# Patient Record
Sex: Female | Born: 1935 | Race: Black or African American | Hispanic: No | Marital: Married | Smoking: Never smoker
Health system: Southern US, Community
[De-identification: ages and names within clinical notes are randomized; demographics above are authoritative.]

## PROBLEM LIST (undated history)

## (undated) DIAGNOSIS — R053 Chronic cough: Secondary | ICD-10-CM

## (undated) DIAGNOSIS — K859 Acute pancreatitis without necrosis or infection, unspecified: Secondary | ICD-10-CM

## (undated) DIAGNOSIS — N1832 Chronic kidney disease, stage 3b: Secondary | ICD-10-CM

## (undated) DIAGNOSIS — E669 Obesity, unspecified: Secondary | ICD-10-CM

## (undated) DIAGNOSIS — R05 Cough: Secondary | ICD-10-CM

## (undated) DIAGNOSIS — I3139 Other pericardial effusion (noninflammatory): Secondary | ICD-10-CM

## (undated) DIAGNOSIS — K279 Peptic ulcer, site unspecified, unspecified as acute or chronic, without hemorrhage or perforation: Secondary | ICD-10-CM

## (undated) DIAGNOSIS — F329 Major depressive disorder, single episode, unspecified: Secondary | ICD-10-CM

## (undated) DIAGNOSIS — I491 Atrial premature depolarization: Secondary | ICD-10-CM

## (undated) DIAGNOSIS — I313 Pericardial effusion (noninflammatory): Secondary | ICD-10-CM

## (undated) DIAGNOSIS — M1990 Unspecified osteoarthritis, unspecified site: Secondary | ICD-10-CM

## (undated) DIAGNOSIS — B1920 Unspecified viral hepatitis C without hepatic coma: Secondary | ICD-10-CM

## (undated) DIAGNOSIS — K219 Gastro-esophageal reflux disease without esophagitis: Secondary | ICD-10-CM

## (undated) DIAGNOSIS — I493 Ventricular premature depolarization: Secondary | ICD-10-CM

## (undated) DIAGNOSIS — I119 Hypertensive heart disease without heart failure: Secondary | ICD-10-CM

## (undated) DIAGNOSIS — E785 Hyperlipidemia, unspecified: Secondary | ICD-10-CM

## (undated) DIAGNOSIS — F32A Depression, unspecified: Secondary | ICD-10-CM

## (undated) DIAGNOSIS — I2510 Atherosclerotic heart disease of native coronary artery without angina pectoris: Secondary | ICD-10-CM

## (undated) DIAGNOSIS — G4730 Sleep apnea, unspecified: Secondary | ICD-10-CM

## (undated) DIAGNOSIS — I350 Nonrheumatic aortic (valve) stenosis: Secondary | ICD-10-CM

## (undated) DIAGNOSIS — I5032 Chronic diastolic (congestive) heart failure: Secondary | ICD-10-CM

## (undated) DIAGNOSIS — R0600 Dyspnea, unspecified: Secondary | ICD-10-CM

## (undated) DIAGNOSIS — B1910 Unspecified viral hepatitis B without hepatic coma: Secondary | ICD-10-CM

## (undated) DIAGNOSIS — K589 Irritable bowel syndrome without diarrhea: Secondary | ICD-10-CM

## (undated) HISTORY — DX: Atherosclerotic heart disease of native coronary artery without angina pectoris: I25.10

## (undated) HISTORY — DX: Depression, unspecified: F32.A

## (undated) HISTORY — DX: Unspecified viral hepatitis B without hepatic coma: B19.10

## (undated) HISTORY — DX: Hyperlipidemia, unspecified: E78.5

## (undated) HISTORY — DX: Irritable bowel syndrome, unspecified: K58.9

## (undated) HISTORY — DX: Other pericardial effusion (noninflammatory): I31.39

## (undated) HISTORY — DX: Unspecified osteoarthritis, unspecified site: M19.90

## (undated) HISTORY — PX: CHOLECYSTECTOMY: SHX55

## (undated) HISTORY — DX: Major depressive disorder, single episode, unspecified: F32.9

## (undated) HISTORY — DX: Hypertensive heart disease without heart failure: I11.9

## (undated) HISTORY — DX: Peptic ulcer, site unspecified, unspecified as acute or chronic, without hemorrhage or perforation: K27.9

## (undated) HISTORY — DX: Chronic diastolic (congestive) heart failure: I50.32

## (undated) HISTORY — DX: Nonrheumatic aortic (valve) stenosis: I35.0

## (undated) HISTORY — DX: Acute pancreatitis without necrosis or infection, unspecified: K85.90

## (undated) HISTORY — DX: Unspecified viral hepatitis C without hepatic coma: B19.20

## (undated) HISTORY — DX: Chronic cough: R05.3

## (undated) HISTORY — DX: Cough: R05

## (undated) HISTORY — DX: Gastro-esophageal reflux disease without esophagitis: K21.9

## (undated) HISTORY — DX: Dyspnea, unspecified: R06.00

## (undated) HISTORY — DX: Ventricular premature depolarization: I49.3

## (undated) HISTORY — DX: Atrial premature depolarization: I49.1

## (undated) HISTORY — DX: Sleep apnea, unspecified: G47.30

## (undated) HISTORY — DX: Pericardial effusion (noninflammatory): I31.3

## (undated) HISTORY — DX: Obesity, unspecified: E66.9

---

## 1988-06-06 HISTORY — PX: BREAST EXCISIONAL BIOPSY: SUR124

## 1993-06-06 HISTORY — PX: TUBAL LIGATION: SHX77

## 1996-06-06 HISTORY — PX: OTHER SURGICAL HISTORY: SHX169

## 1997-10-21 ENCOUNTER — Inpatient Hospital Stay (HOSPITAL_COMMUNITY): Admission: EM | Admit: 1997-10-21 | Discharge: 1997-10-22 | Payer: Self-pay | Admitting: Emergency Medicine

## 1997-12-15 ENCOUNTER — Ambulatory Visit: Admission: RE | Admit: 1997-12-15 | Discharge: 1997-12-15 | Payer: Self-pay | Admitting: Infectious Diseases

## 1998-06-23 ENCOUNTER — Ambulatory Visit (HOSPITAL_COMMUNITY): Admission: RE | Admit: 1998-06-23 | Discharge: 1998-06-23 | Payer: Self-pay | Admitting: Internal Medicine

## 1998-06-23 ENCOUNTER — Encounter: Payer: Self-pay | Admitting: Internal Medicine

## 1999-01-21 ENCOUNTER — Other Ambulatory Visit: Admission: RE | Admit: 1999-01-21 | Discharge: 1999-01-21 | Payer: Self-pay | Admitting: Family Medicine

## 1999-02-25 ENCOUNTER — Ambulatory Visit (HOSPITAL_COMMUNITY): Admission: RE | Admit: 1999-02-25 | Discharge: 1999-02-25 | Payer: Self-pay | Admitting: Gastroenterology

## 1999-03-09 ENCOUNTER — Other Ambulatory Visit: Admission: RE | Admit: 1999-03-09 | Discharge: 1999-03-09 | Payer: Self-pay | Admitting: Obstetrics and Gynecology

## 1999-03-09 ENCOUNTER — Encounter (INDEPENDENT_AMBULATORY_CARE_PROVIDER_SITE_OTHER): Payer: Self-pay | Admitting: Specialist

## 1999-03-18 ENCOUNTER — Encounter: Payer: Self-pay | Admitting: Emergency Medicine

## 1999-03-18 ENCOUNTER — Emergency Department (HOSPITAL_COMMUNITY): Admission: EM | Admit: 1999-03-18 | Discharge: 1999-03-18 | Payer: Self-pay | Admitting: Emergency Medicine

## 1999-04-01 ENCOUNTER — Encounter: Admission: RE | Admit: 1999-04-01 | Discharge: 1999-05-18 | Payer: Self-pay | Admitting: Family Medicine

## 1999-04-06 ENCOUNTER — Other Ambulatory Visit: Admission: RE | Admit: 1999-04-06 | Discharge: 1999-04-06 | Payer: Self-pay | Admitting: Obstetrics and Gynecology

## 1999-04-13 ENCOUNTER — Ambulatory Visit (HOSPITAL_COMMUNITY): Admission: RE | Admit: 1999-04-13 | Discharge: 1999-04-13 | Payer: Self-pay | Admitting: Family Medicine

## 1999-04-13 ENCOUNTER — Encounter: Payer: Self-pay | Admitting: Family Medicine

## 1999-12-23 ENCOUNTER — Encounter: Payer: Self-pay | Admitting: Gastroenterology

## 1999-12-23 ENCOUNTER — Ambulatory Visit (HOSPITAL_COMMUNITY): Admission: RE | Admit: 1999-12-23 | Discharge: 1999-12-23 | Payer: Self-pay | Admitting: Gastroenterology

## 2000-01-03 ENCOUNTER — Encounter (INDEPENDENT_AMBULATORY_CARE_PROVIDER_SITE_OTHER): Payer: Self-pay

## 2000-01-03 ENCOUNTER — Ambulatory Visit (HOSPITAL_COMMUNITY): Admission: RE | Admit: 2000-01-03 | Discharge: 2000-01-03 | Payer: Self-pay | Admitting: Gastroenterology

## 2000-04-19 ENCOUNTER — Encounter (INDEPENDENT_AMBULATORY_CARE_PROVIDER_SITE_OTHER): Payer: Self-pay | Admitting: *Deleted

## 2000-04-19 ENCOUNTER — Ambulatory Visit (HOSPITAL_COMMUNITY): Admission: RE | Admit: 2000-04-19 | Discharge: 2000-04-19 | Payer: Self-pay | Admitting: Gastroenterology

## 2000-08-23 ENCOUNTER — Encounter: Payer: Self-pay | Admitting: Internal Medicine

## 2000-08-23 ENCOUNTER — Encounter: Admission: RE | Admit: 2000-08-23 | Discharge: 2000-08-23 | Payer: Self-pay | Admitting: Internal Medicine

## 2001-08-28 ENCOUNTER — Encounter: Payer: Self-pay | Admitting: Internal Medicine

## 2001-08-28 ENCOUNTER — Encounter: Admission: RE | Admit: 2001-08-28 | Discharge: 2001-08-28 | Payer: Self-pay | Admitting: Internal Medicine

## 2001-10-04 ENCOUNTER — Ambulatory Visit (HOSPITAL_COMMUNITY): Admission: RE | Admit: 2001-10-04 | Discharge: 2001-10-04 | Payer: Self-pay | Admitting: Gastroenterology

## 2001-10-04 ENCOUNTER — Encounter (INDEPENDENT_AMBULATORY_CARE_PROVIDER_SITE_OTHER): Payer: Self-pay | Admitting: Specialist

## 2001-10-23 ENCOUNTER — Encounter (INDEPENDENT_AMBULATORY_CARE_PROVIDER_SITE_OTHER): Payer: Self-pay

## 2001-10-23 ENCOUNTER — Ambulatory Visit (HOSPITAL_COMMUNITY): Admission: RE | Admit: 2001-10-23 | Discharge: 2001-10-23 | Payer: Self-pay | Admitting: Gastroenterology

## 2002-01-04 ENCOUNTER — Other Ambulatory Visit: Admission: RE | Admit: 2002-01-04 | Discharge: 2002-01-04 | Payer: Self-pay | Admitting: *Deleted

## 2002-08-30 ENCOUNTER — Encounter: Admission: RE | Admit: 2002-08-30 | Discharge: 2002-08-30 | Payer: Self-pay | Admitting: Internal Medicine

## 2002-08-30 ENCOUNTER — Encounter: Payer: Self-pay | Admitting: Internal Medicine

## 2003-09-01 ENCOUNTER — Encounter: Admission: RE | Admit: 2003-09-01 | Discharge: 2003-09-01 | Payer: Self-pay | Admitting: Internal Medicine

## 2004-01-25 ENCOUNTER — Emergency Department (HOSPITAL_COMMUNITY): Admission: EM | Admit: 2004-01-25 | Discharge: 2004-01-25 | Payer: Self-pay | Admitting: Emergency Medicine

## 2004-02-11 ENCOUNTER — Encounter (INDEPENDENT_AMBULATORY_CARE_PROVIDER_SITE_OTHER): Payer: Self-pay | Admitting: Specialist

## 2004-02-11 ENCOUNTER — Ambulatory Visit (HOSPITAL_COMMUNITY): Admission: RE | Admit: 2004-02-11 | Discharge: 2004-02-11 | Payer: Self-pay | Admitting: Gastroenterology

## 2004-07-20 ENCOUNTER — Encounter: Admission: RE | Admit: 2004-07-20 | Discharge: 2004-07-20 | Payer: Self-pay | Admitting: Gastroenterology

## 2004-09-02 ENCOUNTER — Encounter: Admission: RE | Admit: 2004-09-02 | Discharge: 2004-09-02 | Payer: Self-pay | Admitting: Internal Medicine

## 2004-09-23 ENCOUNTER — Ambulatory Visit: Payer: Self-pay | Admitting: Internal Medicine

## 2004-09-27 ENCOUNTER — Ambulatory Visit: Payer: Self-pay | Admitting: Internal Medicine

## 2004-10-22 ENCOUNTER — Ambulatory Visit: Payer: Self-pay | Admitting: Internal Medicine

## 2004-12-17 ENCOUNTER — Ambulatory Visit: Payer: Self-pay | Admitting: Internal Medicine

## 2004-12-27 ENCOUNTER — Ambulatory Visit: Payer: Self-pay | Admitting: Cardiology

## 2005-01-28 ENCOUNTER — Ambulatory Visit: Payer: Self-pay | Admitting: Cardiology

## 2005-02-11 ENCOUNTER — Ambulatory Visit: Payer: Self-pay | Admitting: Internal Medicine

## 2005-05-11 ENCOUNTER — Ambulatory Visit: Payer: Self-pay | Admitting: Internal Medicine

## 2005-05-28 ENCOUNTER — Encounter: Admission: RE | Admit: 2005-05-28 | Discharge: 2005-05-28 | Payer: Self-pay | Admitting: Internal Medicine

## 2005-05-31 ENCOUNTER — Ambulatory Visit: Payer: Self-pay | Admitting: Endocrinology

## 2005-08-11 ENCOUNTER — Emergency Department (HOSPITAL_COMMUNITY): Admission: EM | Admit: 2005-08-11 | Discharge: 2005-08-12 | Payer: Self-pay | Admitting: Emergency Medicine

## 2005-08-12 ENCOUNTER — Ambulatory Visit: Payer: Self-pay | Admitting: Internal Medicine

## 2005-08-26 ENCOUNTER — Ambulatory Visit: Payer: Self-pay | Admitting: Internal Medicine

## 2005-09-05 ENCOUNTER — Ambulatory Visit: Payer: Self-pay | Admitting: Internal Medicine

## 2005-09-06 ENCOUNTER — Encounter: Admission: RE | Admit: 2005-09-06 | Discharge: 2005-09-06 | Payer: Self-pay | Admitting: Internal Medicine

## 2005-09-13 ENCOUNTER — Ambulatory Visit: Payer: Self-pay | Admitting: Internal Medicine

## 2005-11-19 ENCOUNTER — Ambulatory Visit: Payer: Self-pay | Admitting: Family Medicine

## 2006-02-03 ENCOUNTER — Ambulatory Visit: Payer: Self-pay | Admitting: Internal Medicine

## 2006-05-01 ENCOUNTER — Ambulatory Visit: Payer: Self-pay | Admitting: Internal Medicine

## 2006-07-21 ENCOUNTER — Ambulatory Visit: Payer: Self-pay | Admitting: Internal Medicine

## 2006-08-23 ENCOUNTER — Ambulatory Visit: Payer: Self-pay | Admitting: Internal Medicine

## 2006-08-23 LAB — CONVERTED CEMR LAB
ALT: 20 units/L (ref 0–40)
AST: 22 units/L (ref 0–37)
Albumin: 3.6 g/dL (ref 3.5–5.2)
Alkaline Phosphatase: 91 units/L (ref 39–117)
BUN: 16 mg/dL (ref 6–23)
Basophils Absolute: 0 10*3/uL (ref 0.0–0.1)
Basophils Relative: 0.5 % (ref 0.0–1.0)
Bilirubin Urine: NEGATIVE
Bilirubin, Direct: 0.1 mg/dL (ref 0.0–0.3)
CO2: 32 meq/L (ref 19–32)
Calcium: 9.1 mg/dL (ref 8.4–10.5)
Chloride: 108 meq/L (ref 96–112)
Cholesterol: 180 mg/dL (ref 0–200)
Creatinine, Ser: 0.8 mg/dL (ref 0.4–1.2)
Eosinophils Absolute: 0.3 10*3/uL (ref 0.0–0.6)
Eosinophils Relative: 3.2 % (ref 0.0–5.0)
GFR calc Af Amer: 92 mL/min
GFR calc non Af Amer: 76 mL/min
Glucose, Bld: 110 mg/dL — ABNORMAL HIGH (ref 70–99)
HCT: 40.9 % (ref 36.0–46.0)
HDL: 44.8 mg/dL (ref >39.0)
Hemoglobin, Urine: NEGATIVE
Hemoglobin: 13.6 g/dL (ref 12.0–15.0)
Ketones, ur: NEGATIVE mg/dL
LDL Cholesterol: 114 mg/dL — ABNORMAL HIGH (ref 0–99)
Leukocytes, UA: NEGATIVE
Lymphocytes Relative: 28.7 % (ref 12.0–46.0)
MCHC: 34.2 g/dL (ref 30.0–36.0)
MCV: 88.4 fL (ref 78.0–100.0)
Monocytes Absolute: 0.6 10*3/uL (ref 0.2–0.7)
Monocytes Relative: 7.5 % (ref 3.0–11.0)
Neutro Abs: 4.9 10*3/uL (ref 1.4–7.7)
Neutrophils Relative %: 60.1 % (ref 43.0–77.0)
Nitrite: NEGATIVE
Platelets: 214 10*3/uL (ref 150–400)
Potassium: 4 meq/L (ref 3.5–5.1)
RBC: 4.41 M/uL (ref 3.87–5.11)
RDW: 12.4 % (ref 11.5–14.6)
Sodium: 145 meq/L (ref 135–145)
Specific Gravity, Urine: 1.025 (ref 1.000–1.03)
TSH: 1.31 microintl units/mL (ref 0.35–5.50)
Total Bilirubin: 0.6 mg/dL (ref 0.3–1.2)
Total CHOL/HDL Ratio: 4
Total Protein, Urine: NEGATIVE mg/dL
Total Protein: 7.1 g/dL (ref 6.0–8.3)
Triglycerides: 108 mg/dL (ref 0–149)
Urine Glucose: NEGATIVE mg/dL
Urobilinogen, UA: 0.2 (ref 0.0–1.0)
VLDL: 22 mg/dL (ref 0–40)
WBC: 8.1 10*3/uL (ref 4.5–10.5)
pH: 6 (ref 5.0–8.0)

## 2006-09-08 ENCOUNTER — Encounter: Admission: RE | Admit: 2006-09-08 | Discharge: 2006-09-08 | Payer: Self-pay | Admitting: Internal Medicine

## 2006-09-21 ENCOUNTER — Ambulatory Visit: Payer: Self-pay | Admitting: Internal Medicine

## 2006-09-28 ENCOUNTER — Ambulatory Visit: Payer: Self-pay | Admitting: Cardiology

## 2006-10-05 ENCOUNTER — Ambulatory Visit: Payer: Self-pay

## 2006-10-05 ENCOUNTER — Encounter: Payer: Self-pay | Admitting: Internal Medicine

## 2006-10-13 ENCOUNTER — Ambulatory Visit: Payer: Self-pay | Admitting: Cardiology

## 2006-11-18 ENCOUNTER — Emergency Department (HOSPITAL_COMMUNITY): Admission: EM | Admit: 2006-11-18 | Discharge: 2006-11-18 | Payer: Self-pay | Admitting: *Deleted

## 2006-11-21 ENCOUNTER — Ambulatory Visit: Payer: Self-pay | Admitting: Internal Medicine

## 2006-12-19 ENCOUNTER — Ambulatory Visit: Payer: Self-pay | Admitting: Internal Medicine

## 2006-12-19 ENCOUNTER — Ambulatory Visit: Payer: Self-pay | Admitting: Cardiovascular Disease

## 2006-12-22 ENCOUNTER — Encounter: Payer: Self-pay | Admitting: Internal Medicine

## 2006-12-22 DIAGNOSIS — K219 Gastro-esophageal reflux disease without esophagitis: Secondary | ICD-10-CM | POA: Insufficient documentation

## 2006-12-22 DIAGNOSIS — Z8619 Personal history of other infectious and parasitic diseases: Secondary | ICD-10-CM | POA: Insufficient documentation

## 2006-12-22 DIAGNOSIS — F411 Generalized anxiety disorder: Secondary | ICD-10-CM | POA: Insufficient documentation

## 2007-02-06 ENCOUNTER — Ambulatory Visit: Payer: Self-pay | Admitting: Internal Medicine

## 2007-02-06 ENCOUNTER — Encounter: Payer: Self-pay | Admitting: Internal Medicine

## 2007-02-06 DIAGNOSIS — B1710 Acute hepatitis C without hepatic coma: Secondary | ICD-10-CM | POA: Insufficient documentation

## 2007-02-06 DIAGNOSIS — Z8719 Personal history of other diseases of the digestive system: Secondary | ICD-10-CM | POA: Insufficient documentation

## 2007-02-06 DIAGNOSIS — K589 Irritable bowel syndrome without diarrhea: Secondary | ICD-10-CM | POA: Insufficient documentation

## 2007-02-06 DIAGNOSIS — I10 Essential (primary) hypertension: Secondary | ICD-10-CM | POA: Insufficient documentation

## 2007-02-06 DIAGNOSIS — F329 Major depressive disorder, single episode, unspecified: Secondary | ICD-10-CM | POA: Insufficient documentation

## 2007-02-06 DIAGNOSIS — F3289 Other specified depressive episodes: Secondary | ICD-10-CM | POA: Insufficient documentation

## 2007-02-06 DIAGNOSIS — M810 Age-related osteoporosis without current pathological fracture: Secondary | ICD-10-CM | POA: Insufficient documentation

## 2007-02-06 DIAGNOSIS — E785 Hyperlipidemia, unspecified: Secondary | ICD-10-CM | POA: Insufficient documentation

## 2007-03-16 ENCOUNTER — Emergency Department (HOSPITAL_COMMUNITY): Admission: EM | Admit: 2007-03-16 | Discharge: 2007-03-16 | Payer: Self-pay | Admitting: Family Medicine

## 2007-03-19 ENCOUNTER — Emergency Department (HOSPITAL_COMMUNITY): Admission: EM | Admit: 2007-03-19 | Discharge: 2007-03-19 | Payer: Self-pay | Admitting: Emergency Medicine

## 2007-03-23 ENCOUNTER — Ambulatory Visit (HOSPITAL_COMMUNITY): Admission: RE | Admit: 2007-03-23 | Discharge: 2007-03-23 | Payer: Self-pay | Admitting: Sports Medicine

## 2007-04-02 ENCOUNTER — Encounter: Admission: RE | Admit: 2007-04-02 | Discharge: 2007-04-02 | Payer: Self-pay | Admitting: Sports Medicine

## 2007-04-30 ENCOUNTER — Ambulatory Visit: Payer: Self-pay | Admitting: Cardiology

## 2007-05-07 ENCOUNTER — Ambulatory Visit: Payer: Self-pay

## 2007-05-14 ENCOUNTER — Ambulatory Visit: Payer: Self-pay | Admitting: Cardiology

## 2007-05-26 ENCOUNTER — Ambulatory Visit: Payer: Self-pay | Admitting: Internal Medicine

## 2007-05-26 DIAGNOSIS — M25569 Pain in unspecified knee: Secondary | ICD-10-CM | POA: Insufficient documentation

## 2007-05-29 ENCOUNTER — Encounter: Payer: Self-pay | Admitting: Internal Medicine

## 2007-06-07 HISTORY — PX: CARDIAC CATHETERIZATION: SHX172

## 2007-06-14 ENCOUNTER — Encounter: Payer: Self-pay | Admitting: Internal Medicine

## 2007-06-15 ENCOUNTER — Encounter: Payer: Self-pay | Admitting: Internal Medicine

## 2007-08-10 ENCOUNTER — Ambulatory Visit: Payer: Self-pay | Admitting: Gastroenterology

## 2007-08-14 ENCOUNTER — Ambulatory Visit (HOSPITAL_COMMUNITY): Admission: RE | Admit: 2007-08-14 | Discharge: 2007-08-14 | Payer: Self-pay | Admitting: Gastroenterology

## 2007-09-10 ENCOUNTER — Encounter: Admission: RE | Admit: 2007-09-10 | Discharge: 2007-09-10 | Payer: Self-pay | Admitting: Internal Medicine

## 2007-10-04 ENCOUNTER — Ambulatory Visit: Payer: Self-pay | Admitting: Gastroenterology

## 2008-01-09 ENCOUNTER — Ambulatory Visit (HOSPITAL_COMMUNITY): Admission: RE | Admit: 2008-01-09 | Discharge: 2008-01-09 | Payer: Self-pay | Admitting: Internal Medicine

## 2008-01-18 ENCOUNTER — Ambulatory Visit: Payer: Self-pay | Admitting: Cardiovascular Disease

## 2008-02-12 ENCOUNTER — Ambulatory Visit: Payer: Self-pay | Admitting: Cardiology

## 2008-02-14 ENCOUNTER — Ambulatory Visit: Payer: Self-pay | Admitting: Cardiology

## 2008-02-14 LAB — CONVERTED CEMR LAB
BUN: 21 mg/dL (ref 6–23)
Basophils Absolute: 0 10*3/uL (ref 0.0–0.1)
Basophils Relative: 0.7 % (ref 0.0–3.0)
CO2: 30 meq/L (ref 19–32)
Calcium: 9.6 mg/dL (ref 8.4–10.5)
Chloride: 107 meq/L (ref 96–112)
Cholesterol: 157 mg/dL (ref 0–200)
Creatinine, Ser: 1.1 mg/dL (ref 0.4–1.2)
Eosinophils Absolute: 0.3 10*3/uL (ref 0.0–0.7)
Eosinophils Relative: 4.3 % (ref 0.0–5.0)
GFR calc Af Amer: 63 mL/min
GFR calc non Af Amer: 52 mL/min
Glucose, Bld: 85 mg/dL (ref 70–99)
HCT: 38.9 % (ref 36.0–46.0)
HDL: 36.8 mg/dL — ABNORMAL LOW (ref >39.0)
Hemoglobin: 13.2 g/dL (ref 12.0–15.0)
INR: 1 (ref 0.8–1.0)
LDL Cholesterol: 109 mg/dL — ABNORMAL HIGH (ref 0–99)
Lymphocytes Relative: 33.3 % (ref 12.0–46.0)
MCHC: 34 g/dL (ref 30.0–36.0)
MCV: 91.2 fL (ref 78.0–100.0)
Monocytes Absolute: 0.6 10*3/uL (ref 0.1–1.0)
Monocytes Relative: 10 % (ref 3.0–12.0)
Neutro Abs: 3.2 10*3/uL (ref 1.4–7.7)
Neutrophils Relative %: 51.7 % (ref 43.0–77.0)
Platelets: 172 10*3/uL (ref 150–400)
Potassium: 4.1 meq/L (ref 3.5–5.1)
Prothrombin Time: 12.4 s (ref 10.9–13.3)
RBC: 4.27 M/uL (ref 3.87–5.11)
RDW: 12 % (ref 11.5–14.6)
Sodium: 143 meq/L (ref 135–145)
Total CHOL/HDL Ratio: 4.3
Triglycerides: 57 mg/dL (ref 0–149)
VLDL: 11 mg/dL (ref 0–40)
WBC: 6.1 10*3/uL (ref 4.5–10.5)

## 2008-02-20 ENCOUNTER — Ambulatory Visit: Payer: Self-pay | Admitting: Cardiology

## 2008-02-20 ENCOUNTER — Inpatient Hospital Stay (HOSPITAL_BASED_OUTPATIENT_CLINIC_OR_DEPARTMENT_OTHER): Admission: RE | Admit: 2008-02-20 | Discharge: 2008-02-20 | Payer: Self-pay | Admitting: Cardiology

## 2008-02-29 ENCOUNTER — Ambulatory Visit: Payer: Self-pay | Admitting: Cardiology

## 2008-03-11 ENCOUNTER — Encounter: Payer: Self-pay | Admitting: Internal Medicine

## 2008-03-13 ENCOUNTER — Ambulatory Visit: Payer: Self-pay | Admitting: Cardiology

## 2008-05-15 ENCOUNTER — Ambulatory Visit: Payer: Self-pay | Admitting: Cardiology

## 2008-05-15 ENCOUNTER — Ambulatory Visit: Payer: Self-pay

## 2008-05-15 LAB — CONVERTED CEMR LAB
ALT: 32 units/L (ref 0–35)
AST: 28 units/L (ref 0–37)
Albumin: 3.5 g/dL (ref 3.5–5.2)
Alkaline Phosphatase: 72 units/L (ref 39–117)
Bilirubin, Direct: 0.1 mg/dL (ref 0.0–0.3)
Cholesterol: 124 mg/dL (ref 0–200)
HDL: 40.5 mg/dL (ref >39.0)
LDL Cholesterol: 73 mg/dL (ref 0–99)
Total Bilirubin: 0.7 mg/dL (ref 0.3–1.2)
Total CHOL/HDL Ratio: 3.1
Total Protein: 6.9 g/dL (ref 6.0–8.3)
Triglycerides: 53 mg/dL (ref 0–149)
VLDL: 11 mg/dL (ref 0–40)

## 2008-05-19 DIAGNOSIS — I2510 Atherosclerotic heart disease of native coronary artery without angina pectoris: Secondary | ICD-10-CM | POA: Insufficient documentation

## 2008-07-17 ENCOUNTER — Encounter: Payer: Self-pay | Admitting: Internal Medicine

## 2008-08-21 ENCOUNTER — Encounter: Payer: Self-pay | Admitting: Cardiology

## 2008-08-21 ENCOUNTER — Ambulatory Visit: Payer: Self-pay | Admitting: Cardiology

## 2008-08-21 DIAGNOSIS — R011 Cardiac murmur, unspecified: Secondary | ICD-10-CM | POA: Insufficient documentation

## 2008-09-01 ENCOUNTER — Ambulatory Visit: Payer: Self-pay

## 2008-09-01 ENCOUNTER — Ambulatory Visit: Payer: Self-pay | Admitting: Cardiology

## 2008-09-01 ENCOUNTER — Encounter: Payer: Self-pay | Admitting: Cardiology

## 2008-09-01 LAB — CONVERTED CEMR LAB
ALT: 30 units/L (ref 0–35)
AST: 30 units/L (ref 0–37)
Albumin: 3.6 g/dL (ref 3.5–5.2)
Alkaline Phosphatase: 71 units/L (ref 39–117)
BUN: 17 mg/dL (ref 6–23)
Bilirubin, Direct: 0.1 mg/dL (ref 0.0–0.3)
CO2: 28 meq/L (ref 19–32)
Calcium: 9 mg/dL (ref 8.4–10.5)
Chloride: 113 meq/L — ABNORMAL HIGH (ref 96–112)
Cholesterol: 118 mg/dL (ref 0–200)
Creatinine, Ser: 0.8 mg/dL (ref 0.4–1.2)
GFR calc non Af Amer: 90.96 mL/min (ref >60)
Glucose, Bld: 96 mg/dL (ref 70–99)
HDL: 39.8 mg/dL (ref >39.00)
LDL Cholesterol: 64 mg/dL (ref 0–99)
Potassium: 4.2 meq/L (ref 3.5–5.1)
Sodium: 145 meq/L (ref 135–145)
Total Bilirubin: 0.5 mg/dL (ref 0.3–1.2)
Total CHOL/HDL Ratio: 3
Total Protein: 6.7 g/dL (ref 6.0–8.3)
Triglycerides: 69 mg/dL (ref 0.0–149.0)
VLDL: 13.8 mg/dL (ref 0.0–40.0)

## 2008-09-10 ENCOUNTER — Encounter: Admission: RE | Admit: 2008-09-10 | Discharge: 2008-09-10 | Payer: Self-pay | Admitting: Internal Medicine

## 2008-09-24 ENCOUNTER — Telehealth: Payer: Self-pay | Admitting: Cardiology

## 2008-10-21 ENCOUNTER — Encounter: Payer: Self-pay | Admitting: Internal Medicine

## 2009-02-13 ENCOUNTER — Ambulatory Visit: Payer: Self-pay | Admitting: Cardiology

## 2009-02-13 DIAGNOSIS — R058 Other specified cough: Secondary | ICD-10-CM | POA: Insufficient documentation

## 2009-02-13 DIAGNOSIS — R05 Cough: Secondary | ICD-10-CM | POA: Insufficient documentation

## 2009-02-18 ENCOUNTER — Encounter: Payer: Self-pay | Admitting: Cardiology

## 2009-02-19 ENCOUNTER — Encounter: Payer: Self-pay | Admitting: Internal Medicine

## 2009-02-19 ENCOUNTER — Encounter: Payer: Self-pay | Admitting: Cardiology

## 2009-02-23 ENCOUNTER — Telehealth: Payer: Self-pay | Admitting: Cardiology

## 2009-02-25 ENCOUNTER — Ambulatory Visit: Payer: Self-pay | Admitting: Internal Medicine

## 2009-02-27 ENCOUNTER — Encounter: Payer: Self-pay | Admitting: Cardiology

## 2009-03-30 ENCOUNTER — Ambulatory Visit: Payer: Self-pay | Admitting: Cardiology

## 2009-04-13 ENCOUNTER — Telehealth: Payer: Self-pay | Admitting: Cardiology

## 2009-04-13 ENCOUNTER — Ambulatory Visit: Payer: Self-pay | Admitting: Cardiology

## 2009-04-14 LAB — CONVERTED CEMR LAB
ALT: 23 units/L (ref 0–35)
AST: 17 units/L (ref 0–37)
Albumin: 3.5 g/dL (ref 3.5–5.2)
Alkaline Phosphatase: 61 units/L (ref 39–117)
BUN: 20 mg/dL (ref 6–23)
Bilirubin, Direct: 0.1 mg/dL (ref 0.0–0.3)
CO2: 27 meq/L (ref 19–32)
Calcium: 8.9 mg/dL (ref 8.4–10.5)
Chloride: 109 meq/L (ref 96–112)
Cholesterol: 178 mg/dL (ref 0–200)
Creatinine, Ser: 0.9 mg/dL (ref 0.4–1.2)
GFR calc non Af Amer: 79.26 mL/min (ref >60)
Glucose, Bld: 86 mg/dL (ref 70–99)
HDL: 64.5 mg/dL (ref >39.00)
LDL Cholesterol: 93 mg/dL (ref 0–99)
Potassium: 3.4 meq/L — ABNORMAL LOW (ref 3.5–5.1)
Pro B Natriuretic peptide (BNP): 75 pg/mL (ref 0.0–100.0)
Sodium: 145 meq/L (ref 135–145)
Total Bilirubin: 0.7 mg/dL (ref 0.3–1.2)
Total CHOL/HDL Ratio: 3
Total Protein: 6.8 g/dL (ref 6.0–8.3)
Triglycerides: 101 mg/dL (ref 0.0–149.0)
VLDL: 20.2 mg/dL (ref 0.0–40.0)

## 2009-04-17 ENCOUNTER — Telehealth: Payer: Self-pay | Admitting: Cardiology

## 2009-04-25 ENCOUNTER — Encounter: Admission: RE | Admit: 2009-04-25 | Discharge: 2009-04-25 | Payer: Self-pay | Admitting: Internal Medicine

## 2009-04-29 ENCOUNTER — Telehealth: Payer: Self-pay | Admitting: Cardiology

## 2009-05-01 ENCOUNTER — Ambulatory Visit: Payer: Self-pay | Admitting: Cardiology

## 2009-05-04 ENCOUNTER — Encounter: Payer: Self-pay | Admitting: Internal Medicine

## 2009-05-04 LAB — CONVERTED CEMR LAB
BUN: 22 mg/dL (ref 6–23)
CO2: 26 meq/L (ref 19–32)
Calcium: 8.7 mg/dL (ref 8.4–10.5)
Chloride: 110 meq/L (ref 96–112)
Creatinine, Ser: 1 mg/dL (ref 0.4–1.2)
GFR calc non Af Amer: 70.18 mL/min (ref >60)
Glucose, Bld: 91 mg/dL (ref 70–99)
Potassium: 3.7 meq/L (ref 3.5–5.1)
Sodium: 143 meq/L (ref 135–145)

## 2009-06-17 ENCOUNTER — Telehealth: Payer: Self-pay | Admitting: Cardiology

## 2009-06-30 ENCOUNTER — Ambulatory Visit: Payer: Self-pay | Admitting: Cardiology

## 2009-07-06 LAB — CONVERTED CEMR LAB
ALT: 29 units/L (ref 0–35)
AST: 31 units/L (ref 0–37)
Albumin: 3.5 g/dL (ref 3.5–5.2)
Alkaline Phosphatase: 55 units/L (ref 39–117)
Bilirubin, Direct: 0.1 mg/dL (ref 0.0–0.3)
Cholesterol: 123 mg/dL (ref 0–200)
HDL: 42.5 mg/dL (ref >39.00)
LDL Cholesterol: 66 mg/dL (ref 0–99)
Total Bilirubin: 0.5 mg/dL (ref 0.3–1.2)
Total CHOL/HDL Ratio: 3
Total Protein: 7.2 g/dL (ref 6.0–8.3)
Triglycerides: 75 mg/dL (ref 0.0–149.0)
VLDL: 15 mg/dL (ref 0.0–40.0)

## 2009-07-14 ENCOUNTER — Ambulatory Visit: Payer: Self-pay | Admitting: Cardiology

## 2009-08-10 ENCOUNTER — Telehealth: Payer: Self-pay | Admitting: Cardiology

## 2009-08-10 ENCOUNTER — Ambulatory Visit: Payer: Self-pay | Admitting: Cardiology

## 2009-08-10 DIAGNOSIS — R0602 Shortness of breath: Secondary | ICD-10-CM | POA: Insufficient documentation

## 2009-08-13 ENCOUNTER — Telehealth: Payer: Self-pay | Admitting: Cardiology

## 2009-08-14 ENCOUNTER — Encounter: Payer: Self-pay | Admitting: Cardiology

## 2009-08-15 ENCOUNTER — Encounter: Payer: Self-pay | Admitting: Cardiology

## 2009-08-21 ENCOUNTER — Ambulatory Visit: Payer: Self-pay | Admitting: Cardiology

## 2009-08-21 ENCOUNTER — Telehealth (INDEPENDENT_AMBULATORY_CARE_PROVIDER_SITE_OTHER): Payer: Self-pay | Admitting: *Deleted

## 2009-08-25 LAB — CONVERTED CEMR LAB
BUN: 19 mg/dL (ref 6–23)
CO2: 27 meq/L (ref 19–32)
Calcium: 9.1 mg/dL (ref 8.4–10.5)
Chloride: 112 meq/L (ref 96–112)
Creatinine, Ser: 1 mg/dL (ref 0.4–1.2)
GFR calc non Af Amer: 70.12 mL/min (ref >60)
Glucose, Bld: 106 mg/dL — ABNORMAL HIGH (ref 70–99)
Potassium: 4.2 meq/L (ref 3.5–5.1)
Pro B Natriuretic peptide (BNP): 55 pg/mL (ref 0.0–100.0)
Sodium: 143 meq/L (ref 135–145)
TSH: 1.04 microintl units/mL (ref 0.35–5.50)

## 2009-09-09 ENCOUNTER — Ambulatory Visit: Payer: Self-pay | Admitting: Cardiology

## 2009-09-11 ENCOUNTER — Encounter: Admission: RE | Admit: 2009-09-11 | Discharge: 2009-09-11 | Payer: Self-pay | Admitting: Internal Medicine

## 2009-09-21 ENCOUNTER — Encounter: Payer: Self-pay | Admitting: Cardiology

## 2009-10-27 ENCOUNTER — Telehealth: Payer: Self-pay | Admitting: Cardiology

## 2009-10-28 ENCOUNTER — Encounter: Payer: Self-pay | Admitting: Internal Medicine

## 2009-10-29 ENCOUNTER — Ambulatory Visit: Payer: Self-pay | Admitting: Pulmonary Disease

## 2009-11-03 ENCOUNTER — Ambulatory Visit: Payer: Self-pay

## 2009-11-03 ENCOUNTER — Encounter: Payer: Self-pay | Admitting: Internal Medicine

## 2009-11-03 DIAGNOSIS — R079 Chest pain, unspecified: Secondary | ICD-10-CM | POA: Insufficient documentation

## 2009-11-04 ENCOUNTER — Ambulatory Visit: Payer: Self-pay | Admitting: Cardiology

## 2009-11-06 ENCOUNTER — Telehealth: Payer: Self-pay | Admitting: Pulmonary Disease

## 2009-11-23 ENCOUNTER — Ambulatory Visit: Payer: Self-pay | Admitting: Pulmonary Disease

## 2009-11-25 ENCOUNTER — Ambulatory Visit: Payer: Self-pay | Admitting: Cardiology

## 2009-11-25 LAB — CONVERTED CEMR LAB
ALT: 28 units/L (ref 0–35)
AST: 22 units/L (ref 0–37)
Albumin: 3.8 g/dL (ref 3.5–5.2)
Alkaline Phosphatase: 65 units/L (ref 39–117)
Bilirubin, Direct: 0.2 mg/dL (ref 0.0–0.3)
Cholesterol: 177 mg/dL (ref 0–200)
HDL: 53.3 mg/dL (ref >39.00)
LDL Cholesterol: 101 mg/dL — ABNORMAL HIGH (ref 0–99)
Total Bilirubin: 0.7 mg/dL (ref 0.3–1.2)
Total CHOL/HDL Ratio: 3
Total Protein: 6.8 g/dL (ref 6.0–8.3)
Triglycerides: 116 mg/dL (ref 0.0–149.0)
VLDL: 23.2 mg/dL (ref 0.0–40.0)

## 2009-12-10 ENCOUNTER — Ambulatory Visit: Payer: Self-pay | Admitting: Pulmonary Disease

## 2009-12-10 DIAGNOSIS — J45991 Cough variant asthma: Secondary | ICD-10-CM | POA: Insufficient documentation

## 2009-12-10 LAB — CONVERTED CEMR LAB: IgE (Immunoglobulin E), Serum: 12.2 intl units/mL (ref 0.0–180.0)

## 2009-12-14 ENCOUNTER — Telehealth (INDEPENDENT_AMBULATORY_CARE_PROVIDER_SITE_OTHER): Payer: Self-pay | Admitting: *Deleted

## 2009-12-17 ENCOUNTER — Telehealth: Payer: Self-pay | Admitting: Cardiology

## 2010-01-04 ENCOUNTER — Ambulatory Visit: Payer: Self-pay | Admitting: Pulmonary Disease

## 2010-01-06 ENCOUNTER — Encounter: Payer: Self-pay | Admitting: Cardiology

## 2010-01-09 ENCOUNTER — Emergency Department (HOSPITAL_COMMUNITY): Admission: EM | Admit: 2010-01-09 | Discharge: 2010-01-09 | Payer: Self-pay | Admitting: Emergency Medicine

## 2010-01-18 ENCOUNTER — Ambulatory Visit: Payer: Self-pay | Admitting: Pulmonary Disease

## 2010-01-18 LAB — CONVERTED CEMR LAB: Sed Rate: 22 mm/hr (ref 0–22)

## 2010-01-19 ENCOUNTER — Ambulatory Visit: Payer: Self-pay | Admitting: Pulmonary Disease

## 2010-01-19 ENCOUNTER — Ambulatory Visit: Payer: Self-pay | Admitting: Cardiology

## 2010-01-29 ENCOUNTER — Ambulatory Visit: Payer: Self-pay

## 2010-01-29 ENCOUNTER — Telehealth: Payer: Self-pay | Admitting: Cardiology

## 2010-01-29 ENCOUNTER — Ambulatory Visit: Payer: Self-pay | Admitting: Cardiology

## 2010-02-02 ENCOUNTER — Ambulatory Visit: Payer: Self-pay | Admitting: Pulmonary Disease

## 2010-02-04 ENCOUNTER — Encounter: Payer: Self-pay | Admitting: Pulmonary Disease

## 2010-02-09 LAB — CONVERTED CEMR LAB
Anti Nuclear Antibody(ANA): NEGATIVE
Rhuematoid fact SerPl-aCnc: 53 intl units/mL — ABNORMAL HIGH (ref 0–20)

## 2010-02-11 ENCOUNTER — Encounter: Admission: RE | Admit: 2010-02-11 | Discharge: 2010-03-09 | Payer: Self-pay | Admitting: Orthopedic Surgery

## 2010-02-12 ENCOUNTER — Encounter: Payer: Self-pay | Admitting: Pulmonary Disease

## 2010-02-15 ENCOUNTER — Encounter: Payer: Self-pay | Admitting: Pulmonary Disease

## 2010-02-17 ENCOUNTER — Encounter: Payer: Self-pay | Admitting: Cardiology

## 2010-02-18 ENCOUNTER — Telehealth: Payer: Self-pay | Admitting: Pulmonary Disease

## 2010-02-18 ENCOUNTER — Telehealth: Payer: Self-pay | Admitting: Cardiology

## 2010-02-19 ENCOUNTER — Ambulatory Visit: Payer: Self-pay | Admitting: Cardiology

## 2010-02-23 ENCOUNTER — Telehealth: Payer: Self-pay | Admitting: Cardiology

## 2010-02-23 LAB — CONVERTED CEMR LAB
BUN: 26 mg/dL — ABNORMAL HIGH (ref 6–23)
CO2: 27 meq/L (ref 19–32)
Calcium: 8.8 mg/dL (ref 8.4–10.5)
Chloride: 110 meq/L (ref 96–112)
Creatinine, Ser: 0.9 mg/dL (ref 0.4–1.2)
GFR calc non Af Amer: 75.2 mL/min (ref >60)
Glucose, Bld: 90 mg/dL (ref 70–99)
Potassium: 4.6 meq/L (ref 3.5–5.1)
Sodium: 143 meq/L (ref 135–145)

## 2010-03-01 ENCOUNTER — Ambulatory Visit: Payer: Self-pay | Admitting: Pulmonary Disease

## 2010-03-05 ENCOUNTER — Telehealth (INDEPENDENT_AMBULATORY_CARE_PROVIDER_SITE_OTHER): Payer: Self-pay | Admitting: *Deleted

## 2010-03-09 ENCOUNTER — Encounter: Payer: Self-pay | Admitting: Cardiology

## 2010-03-15 ENCOUNTER — Encounter: Payer: Self-pay | Admitting: Pulmonary Disease

## 2010-03-16 ENCOUNTER — Telehealth (INDEPENDENT_AMBULATORY_CARE_PROVIDER_SITE_OTHER): Payer: Self-pay | Admitting: *Deleted

## 2010-03-24 ENCOUNTER — Encounter: Payer: Self-pay | Admitting: Cardiology

## 2010-04-08 ENCOUNTER — Telehealth: Payer: Self-pay | Admitting: Pulmonary Disease

## 2010-05-04 ENCOUNTER — Telehealth: Payer: Self-pay | Admitting: Adult Health

## 2010-05-14 ENCOUNTER — Ambulatory Visit: Payer: Self-pay | Admitting: Pulmonary Disease

## 2010-05-19 ENCOUNTER — Encounter: Payer: Self-pay | Admitting: Cardiology

## 2010-05-19 ENCOUNTER — Ambulatory Visit: Payer: Self-pay | Admitting: Cardiology

## 2010-05-25 ENCOUNTER — Telehealth: Payer: Self-pay | Admitting: Cardiology

## 2010-06-08 ENCOUNTER — Telehealth: Payer: Self-pay | Admitting: Cardiology

## 2010-06-11 ENCOUNTER — Encounter
Admission: RE | Admit: 2010-06-11 | Discharge: 2010-06-11 | Payer: Self-pay | Source: Home / Self Care | Attending: Unknown Physician Specialty | Admitting: Unknown Physician Specialty

## 2010-06-17 ENCOUNTER — Ambulatory Visit: Admission: RE | Admit: 2010-06-17 | Discharge: 2010-06-17 | Payer: Self-pay | Source: Home / Self Care

## 2010-06-17 ENCOUNTER — Other Ambulatory Visit: Payer: Self-pay | Admitting: Cardiology

## 2010-06-17 LAB — CK: Total CK: 88 U/L (ref 7–177)

## 2010-06-24 ENCOUNTER — Telehealth (INDEPENDENT_AMBULATORY_CARE_PROVIDER_SITE_OTHER): Payer: Self-pay | Admitting: *Deleted

## 2010-06-25 ENCOUNTER — Inpatient Hospital Stay (HOSPITAL_COMMUNITY)
Admission: RE | Admit: 2010-06-25 | Discharge: 2010-07-01 | Payer: Self-pay | Source: Home / Self Care | Attending: Orthopedic Surgery | Admitting: Orthopedic Surgery

## 2010-06-26 NOTE — Op Note (Signed)
[name] [name], [name] [name]              ACCOUNT NO.:  [id number]  MEDICAL RECORD NO.:  [id number]          PATIENT TYPE:  INP  LOCATION:  5031                         FACILITY:  [facility]  PHYSICIAN:  Almedia Balls. Ranell Patrick, M.D. DATE OF BIRTH:  1935-06-17  DATE OF PROCEDURE:  06/25/2010 DATE OF DISCHARGE:                              OPERATIVE REPORT   PREOPERATIVE DIAGNOSIS:  Left knee end-stage osteoarthritis.  POSTOPERATIVE DIAGNOSIS:  Left knee end-stage osteoarthritis.  PROCEDURE PERFORMED:  Left total knee replacement using DePuy segmental rotating platform prosthesis.  SURGEON:  Almedia Balls. Ranell Patrick, MD  ASSISTANT:  Donnie Coffin. Dixon, PA-C  ANESTHESIA:  General anesthesia was used.  ESTIMATED BLOOD LOSS:  Minimal.  FLUID REPLACEMENT:  1005 mL crystalloid.  URINE OUTPUT:  250 mL.  TOURNIQUET TIME:  1 hour and 30 minutes at 350 mmHg.  INSTRUMENT COUNTS:  Correct.  COMPLICATIONS:  There were no complications.  Preoperative antibiotics were given.INDICATIONS:  The patient is a 75 year old female with worsening left knee pain secondary to end-stage arthritis.  The patient has failed conservative management and desires operative treatment to restore function and eliminate pain.  Informed consent was obtained.  DESCRIPTION OF PROCEDURE:  After an adequate level of anesthesia was achieved, the patient was positioned supine on the operating table. Left leg was correctly identified.  Nonsterile tourniquet was placed on the left proximal thigh.  The right leg was padded and secured to the operating table.  After sterile prep and drape of the left knee, we exsanguinated the limb using Esmarch bandage and elevated the tourniquet to 350 mmHg with knee in flexion.  Longitudinal midline incision was created with a 10-blade scalpel.  Dissection down through the subcutaneous tissues using the knife.  A fresh 10-blade was selected and the median parapatellar arthrotomy was performed and we  divided the lateral patellofemoral ligaments and everted the patella.  We entered the distal femur with a step-cut drill, placed an intramedullary resection guide for the femur set on 10 mm rather 5 degrees left.  After distal femoral resection was performed, we went ahead and sized the femur anterior down to the size 3, performed an anterior, posterior and chamfer cuts using the 4-in-1 block.  Next, we resected ACL, PCL meniscal tissue, subluxed the tibia forward using a McCullough retractor.  We then went ahead and resected the tibia perpendicular to long axis of tibia with minimal posterior slope of this posterior cruciate substituting prosthesis, resecting about 2 mm off the affected medial side.  With this done, we sized the tibia with size 3, we checked our gaps symmetric in flexion and extension.  We resected extra posterior bone off the posterior femoral condyles using a lamina spreader as well as some hypertrophic synovium posteriorly.  At this point, we went ahead and sized her tibia to size 3, did our modular drill and keel punch leaving the trial tibial tray, then we performed our box cut using the box cut guide to size 3 left femur and then once that cut was made, we impacted the size 3 left posterior cruciate substituting prosthesis, femoral prosthesis in place, fit well.  We then  reduced the knee with a size 12.5 insert, which gave [place] full extension and nice flexion stability.  At this point, we resurfaced the patella going from 26 mm down about 17 and then placed a 35-mm patellar button in place after drilling lug holes for that.  We took the knee through full range of motion and nice stability, excellent patellar tracking with no hands technique.  We removed all trial components.  We then went ahead and cemented the real components into place using DePuy high viscosity cement, placed the knee in full extension with a trial in place and once the cement was allowed to  harden, we removed excess cement using quarter-inch curved osteotome.  Upon inspection once we were checking fracture cement, we noted there to be just a tiny little gap between the implant of the cement on the lateral side, but this was felt to be stable.  Thus, we checked the knee through a full range of motion.  We did not see any relative motion at all and we were happy with that.  At this point, we went ahead and trialed again with 12.5, then replaced with a real 12.5 poly insert as we felt that, that back give [place] the best extension and flexion and nice stability through a full arc of motion.  With a real 12.5 insert, we thoroughly pulse irrigated the knee.  We then closed the knee using #1 Vicryl suture figure-of- eight for the parapatellar arthrotomy followed by 0 and 2-0 Vicryl layered closure for subcu and running 4-0 Monocryl for skin.  Steri- Strips applied followed by sterile dressing.  The patient tolerated the surgery well.     Almedia Balls. Ranell Patrick, M.D.     SRN/MEDQ  D:  06/25/2010  T:  06/25/2010  Job:  [id number]  Electronically Signed by Malon Kindle  on 06/26/2010 08:44:58 PM

## 2010-06-27 ENCOUNTER — Encounter: Payer: Self-pay | Admitting: Sports Medicine

## 2010-06-28 LAB — HEPATIC FUNCTION PANEL
ALT: 29 U/L (ref 0–35)
AST: 28 U/L (ref 0–37)
Albumin: 3.6 g/dL (ref 3.5–5.2)
Alkaline Phosphatase: 65 U/L (ref 39–117)
Bilirubin, Direct: 0.2 mg/dL (ref 0.0–0.3)
Indirect Bilirubin: 0.4 mg/dL (ref 0.3–0.9)
Total Bilirubin: 0.6 mg/dL (ref 0.3–1.2)
Total Protein: 6.7 g/dL (ref 6.0–8.3)

## 2010-06-28 LAB — TYPE AND SCREEN
ABO/RH(D): O POS
Antibody Screen: NEGATIVE

## 2010-06-28 LAB — CBC
HCT: 41.3 % (ref 36.0–46.0)
Hemoglobin: 13.5 g/dL (ref 12.0–15.0)
MCH: 30.3 pg (ref 26.0–34.0)
MCHC: 32.7 g/dL (ref 30.0–36.0)
MCV: 92.8 fL (ref 78.0–100.0)
Platelets: 203 10*3/uL (ref 150–400)
RBC: 4.45 MIL/uL (ref 3.87–5.11)
RDW: 12.8 % (ref 11.5–15.5)
WBC: 8.6 10*3/uL (ref 4.0–10.5)

## 2010-06-28 LAB — URINALYSIS, ROUTINE W REFLEX MICROSCOPIC
Bilirubin Urine: NEGATIVE
Hgb urine dipstick: NEGATIVE
Ketones, ur: NEGATIVE mg/dL
Nitrite: NEGATIVE
Protein, ur: NEGATIVE mg/dL
Specific Gravity, Urine: 1.021 (ref 1.005–1.030)
Urine Glucose, Fasting: NEGATIVE mg/dL
Urobilinogen, UA: 0.2 mg/dL (ref 0.0–1.0)
pH: 6 (ref 5.0–8.0)

## 2010-06-28 LAB — BASIC METABOLIC PANEL
BUN: 18 mg/dL (ref 6–23)
CO2: 28 mEq/L (ref 19–32)
Calcium: 9.7 mg/dL (ref 8.4–10.5)
Chloride: 109 mEq/L (ref 96–112)
Creatinine, Ser: 1 mg/dL (ref 0.4–1.2)
GFR calc Af Amer: >60 mL/min (ref >60)
GFR calc non Af Amer: 55 mL/min — ABNORMAL LOW (ref >60)
Glucose, Bld: 95 mg/dL (ref 70–99)
Potassium: 4.9 mEq/L (ref 3.5–5.1)
Sodium: 144 mEq/L (ref 135–145)

## 2010-06-28 LAB — DIFFERENTIAL
Basophils Absolute: 0 10*3/uL (ref 0.0–0.1)
Basophils Relative: 0 % (ref 0–1)
Eosinophils Absolute: 0.2 10*3/uL (ref 0.0–0.7)
Eosinophils Relative: 3 % (ref 0–5)
Lymphocytes Relative: 24 % (ref 12–46)
Lymphs Abs: 2.1 10*3/uL (ref 0.7–4.0)
Monocytes Absolute: 0.7 10*3/uL (ref 0.1–1.0)
Monocytes Relative: 8 % (ref 3–12)
Neutro Abs: 5.6 10*3/uL (ref 1.7–7.7)
Neutrophils Relative %: 65 % (ref 43–77)

## 2010-06-28 LAB — PROTIME-INR
INR: 0.89 (ref 0.00–1.49)
Prothrombin Time: 12.2 seconds (ref 11.6–15.2)

## 2010-06-28 LAB — APTT: aPTT: 30 seconds (ref 24–37)

## 2010-06-28 LAB — SURGICAL PCR SCREEN
MRSA, PCR: NEGATIVE
Staphylococcus aureus: POSITIVE — AB

## 2010-06-28 LAB — ABO/RH: ABO/RH(D): O POS

## 2010-06-29 LAB — CBC
HCT: 33.1 % — ABNORMAL LOW (ref 36.0–46.0)
HCT: 32.7 % — ABNORMAL LOW (ref 36.0–46.0)
HCT: 28.2 % — ABNORMAL LOW (ref 36.0–46.0)
Hemoglobin: 10.8 g/dL — ABNORMAL LOW (ref 12.0–15.0)
Hemoglobin: 10.9 g/dL — ABNORMAL LOW (ref 12.0–15.0)
Hemoglobin: 9.5 g/dL — ABNORMAL LOW (ref 12.0–15.0)
MCH: 30.4 pg (ref 26.0–34.0)
MCH: 30.7 pg (ref 26.0–34.0)
MCH: 30.8 pg (ref 26.0–34.0)
MCHC: 32.6 g/dL (ref 30.0–36.0)
MCHC: 33.3 g/dL (ref 30.0–36.0)
MCHC: 33.7 g/dL (ref 30.0–36.0)
MCV: 93.2 fL (ref 78.0–100.0)
MCV: 92.1 fL (ref 78.0–100.0)
MCV: 91.6 fL (ref 78.0–100.0)
Platelets: 175 10*3/uL (ref 150–400)
Platelets: 169 10*3/uL (ref 150–400)
Platelets: 168 10*3/uL (ref 150–400)
RBC: 3.55 MIL/uL — ABNORMAL LOW (ref 3.87–5.11)
RBC: 3.55 MIL/uL — ABNORMAL LOW (ref 3.87–5.11)
RBC: 3.08 MIL/uL — ABNORMAL LOW (ref 3.87–5.11)
RDW: 12.9 % (ref 11.5–15.5)
RDW: 12.4 % (ref 11.5–15.5)
RDW: 12.5 % (ref 11.5–15.5)
WBC: 9.7 10*3/uL (ref 4.0–10.5)
WBC: 9.7 10*3/uL (ref 4.0–10.5)
WBC: 9.7 10*3/uL (ref 4.0–10.5)

## 2010-06-29 LAB — URINALYSIS, MICROSCOPIC ONLY
Bilirubin Urine: NEGATIVE
Ketones, ur: NEGATIVE mg/dL
Nitrite: POSITIVE — AB
Protein, ur: NEGATIVE mg/dL
Specific Gravity, Urine: 1.008 (ref 1.005–1.030)
Urine Glucose, Fasting: NEGATIVE mg/dL
Urobilinogen, UA: 1 mg/dL (ref 0.0–1.0)
pH: 7.5 (ref 5.0–8.0)

## 2010-06-29 LAB — BASIC METABOLIC PANEL
BUN: 12 mg/dL (ref 6–23)
BUN: 10 mg/dL (ref 6–23)
BUN: 15 mg/dL (ref 6–23)
CO2: 24 mEq/L (ref 19–32)
CO2: 23 mEq/L (ref 19–32)
CO2: 23 mEq/L (ref 19–32)
Calcium: 7.8 mg/dL — ABNORMAL LOW (ref 8.4–10.5)
Calcium: 8.1 mg/dL — ABNORMAL LOW (ref 8.4–10.5)
Calcium: 8.2 mg/dL — ABNORMAL LOW (ref 8.4–10.5)
Chloride: 107 mEq/L (ref 96–112)
Chloride: 105 mEq/L (ref 96–112)
Chloride: 105 mEq/L (ref 96–112)
Creatinine, Ser: 0.89 mg/dL (ref 0.4–1.2)
Creatinine, Ser: 0.83 mg/dL (ref 0.4–1.2)
Creatinine, Ser: 0.93 mg/dL (ref 0.4–1.2)
GFR calc Af Amer: >60 mL/min (ref >60)
GFR calc Af Amer: >60 mL/min (ref >60)
GFR calc Af Amer: >60 mL/min (ref >60)
GFR calc non Af Amer: >60 mL/min (ref >60)
GFR calc non Af Amer: >60 mL/min (ref >60)
GFR calc non Af Amer: 59 mL/min — ABNORMAL LOW (ref >60)
Glucose, Bld: 155 mg/dL — ABNORMAL HIGH (ref 70–99)
Glucose, Bld: 131 mg/dL — ABNORMAL HIGH (ref 70–99)
Glucose, Bld: 110 mg/dL — ABNORMAL HIGH (ref 70–99)
Potassium: 4.2 mEq/L (ref 3.5–5.1)
Potassium: 3.8 mEq/L (ref 3.5–5.1)
Potassium: 4 mEq/L (ref 3.5–5.1)
Sodium: 136 mEq/L (ref 135–145)
Sodium: 134 mEq/L — ABNORMAL LOW (ref 135–145)
Sodium: 134 mEq/L — ABNORMAL LOW (ref 135–145)

## 2010-06-30 LAB — DIFFERENTIAL
Basophils Absolute: 0 10*3/uL (ref 0.0–0.1)
Basophils Relative: 0 % (ref 0–1)
Eosinophils Absolute: 0.3 10*3/uL (ref 0.0–0.7)
Eosinophils Relative: 3 % (ref 0–5)
Lymphocytes Relative: 15 % (ref 12–46)
Lymphs Abs: 1.2 10*3/uL (ref 0.7–4.0)
Monocytes Absolute: 0.8 10*3/uL (ref 0.1–1.0)
Monocytes Relative: 11 % (ref 3–12)
Neutro Abs: 5.6 10*3/uL (ref 1.7–7.7)
Neutrophils Relative %: 71 % (ref 43–77)

## 2010-06-30 LAB — CBC
HCT: 26 % — ABNORMAL LOW (ref 36.0–46.0)
Hemoglobin: 8.8 g/dL — ABNORMAL LOW (ref 12.0–15.0)
MCH: 30.7 pg (ref 26.0–34.0)
MCHC: 33.8 g/dL (ref 30.0–36.0)
MCV: 90.6 fL (ref 78.0–100.0)
Platelets: 186 10*3/uL (ref 150–400)
RBC: 2.87 MIL/uL — ABNORMAL LOW (ref 3.87–5.11)
RDW: 12.5 % (ref 11.5–15.5)
WBC: 7.9 10*3/uL (ref 4.0–10.5)

## 2010-06-30 LAB — BASIC METABOLIC PANEL
BUN: 15 mg/dL (ref 6–23)
CO2: 23 mEq/L (ref 19–32)
Calcium: 8.2 mg/dL — ABNORMAL LOW (ref 8.4–10.5)
Chloride: 106 mEq/L (ref 96–112)
Creatinine, Ser: 0.9 mg/dL (ref 0.4–1.2)
GFR calc Af Amer: >60 mL/min (ref >60)
GFR calc non Af Amer: >60 mL/min (ref >60)
Glucose, Bld: 110 mg/dL — ABNORMAL HIGH (ref 70–99)
Potassium: 4 mEq/L (ref 3.5–5.1)
Sodium: 138 mEq/L (ref 135–145)

## 2010-07-01 LAB — CROSSMATCH
ABO/RH(D): O POS
Antibody Screen: NEGATIVE
Unit division: 0
Unit division: 0

## 2010-07-04 ENCOUNTER — Observation Stay (HOSPITAL_COMMUNITY)
Admission: EM | Admit: 2010-07-04 | Discharge: 2010-07-06 | Disposition: A | Discharge status: Home / Self Care | Payer: Self-pay | Source: Home / Self Care | Admitting: Emergency Medicine

## 2010-07-04 LAB — DIFFERENTIAL
Basophils Absolute: 0 10*3/uL (ref 0.0–0.1)
Basophils Relative: 0 % (ref 0–1)
Eosinophils Absolute: 0.5 10*3/uL (ref 0.0–0.7)
Eosinophils Relative: 6 % — ABNORMAL HIGH (ref 0–5)
Lymphocytes Relative: 26 % (ref 12–46)
Lymphs Abs: 2.2 10*3/uL (ref 0.7–4.0)
Monocytes Absolute: 0.9 10*3/uL (ref 0.1–1.0)
Monocytes Relative: 11 % (ref 3–12)
Neutro Abs: 4.8 10*3/uL (ref 1.7–7.7)
Neutrophils Relative %: 57 % (ref 43–77)

## 2010-07-04 LAB — POCT I-STAT, CHEM 8
BUN: 11 mg/dL (ref 6–23)
Calcium, Ion: 1.1 mmol/L — ABNORMAL LOW (ref 1.12–1.32)
Chloride: 105 mEq/L (ref 96–112)
Creatinine, Ser: 1.1 mg/dL (ref 0.4–1.2)
Glucose, Bld: 126 mg/dL — ABNORMAL HIGH (ref 70–99)
HCT: 36 % (ref 36.0–46.0)
Hemoglobin: 12.2 g/dL (ref 12.0–15.0)
Potassium: 3.9 mEq/L (ref 3.5–5.1)
Sodium: 139 mEq/L (ref 135–145)
TCO2: 24 mmol/L (ref 0–100)

## 2010-07-04 LAB — CBC
HCT: 35.1 % — ABNORMAL LOW (ref 36.0–46.0)
Hemoglobin: 11.9 g/dL — ABNORMAL LOW (ref 12.0–15.0)
MCH: 30.5 pg (ref 26.0–34.0)
MCHC: 33.9 g/dL (ref 30.0–36.0)
MCV: 90 fL (ref 78.0–100.0)
Platelets: 295 10*3/uL (ref 150–400)
RBC: 3.9 MIL/uL (ref 3.87–5.11)
RDW: 12.8 % (ref 11.5–15.5)
WBC: 8.5 10*3/uL (ref 4.0–10.5)

## 2010-07-05 DIAGNOSIS — M79609 Pain in unspecified limb: Secondary | ICD-10-CM

## 2010-07-05 LAB — PROTIME-INR
INR: 0.93 (ref 0.00–1.49)
Prothrombin Time: 12.7 seconds (ref 11.6–15.2)

## 2010-07-06 NOTE — Letter (Signed)
Summary: [facility] Office Visit Note   [facility] Office Visit Note   Imported By: Roderic Ovens 04/05/2010 15:26:31  _____________________________________________________________________  External Attachment:    Type:   Image     Comment:   External Document

## 2010-07-06 NOTE — Assessment & Plan Note (Signed)
Summary: f2w   Visit Type:  Follow-up 2 week Primary Provider:  Dr. Eloise Harman   History of Present Illness: 75 yo with history of CAD (no good options for revascularization) but preserved LV systolic function presents for cardiology followup.  She recently returned from a trip to [place] for a relative's funeral.  She had some mildly increase dyspnea prior to leaving for [place].  CXR done prior to departure showed no PNA or pulmonary edema.  After flying to [place], she became significantly more short of breath and ended up in the hospital.  Her feet had swollen after the airplane flight.  In the ER, she was told she had CHF/"fluid around her heart."  She was admitted for 3 days and given Lasix.  I have the report for a stress test and an echo that were done in [place]. The stress was negative and the echo showed preserved systolic function.  She was put on an antibiotic but denies fever and says she was told that she did not have PNA.  She was not discharged on Lasix.    Ms Kroboth has actually been doing well lately.  She is exercising more with no chest pain and no significant dyspnea.  She has been riding her stationary bike 3-4 miles at a time.  She has been dieting and has lost 3 lbs since the last appointment.  Patient does report that she had an episode last Friday where she felt lightheaded and noted her BP to be 79/40.  She says that she had been on a fast and had not eaten or drunk much that day. Systolic BP has been running 100s-110s, which is improved for her.  She also quit her Crestor because she saw something on the internet about it causing cough and shortness of breath.   ECG: NSR, 1st degree AV block  Labs (3/10): LDL 64, HDL 40, creatinine 0.8 Labs (11/10): BNP 75, K 3.7, creatinine 1 Labs (1/11): HDL 43, LDL 66 Labs (3/11): BNP 55  Problems Prior to Update: 1)  Shortness of Breath  (ICD-786.05) 2)  Cough  (ICD-786.2) 3)  Murmur  (ICD-785.2) 4)  Cad, Native Vessel   (ICD-414.01) 5)  Knee Pain  (ICD-719.46) 6)  Osteoporosis  (ICD-733.00) 7)  Pancreatitis, Hx of  (ICD-V12.70) 8)  Hepatitis C  (ICD-070.51) 9)  Obesity  (ICD-278.00) 10)  Hyperlipidemia  (ICD-272.4) 11)  Depression  (ICD-311) 12)  Hypertension  (ICD-401.9) 13)  Irritable Bowel Syndrome  (ICD-564.1) 14)  Hepatitis B, Hx of  (ICD-V12.09) 15)  Gerd  (ICD-530.81) 16)  Anxiety  (ICD-300.00)  Current Medications (verified): 1)  Plavix 75 Mg Tabs (Clopidogrel Bisulfate) .... Take One Tablet By Mouth Daily 2)  Nitroglycerin 0.4 Mg Subl (Nitroglycerin) .Marland Kitchen.. 1 Tablet Under Tongue As Directed 3)  Toprol Xl 50 Mg Xr24h-Tab (Metoprolol Succinate) .... Take 1 1/2 Tablet By Mouth Once A Day 4)  Aspirin 81 Mg Tabs (Aspirin) .... Once Daily 5)  Losartan Potassium 50 Mg Tabs (Losartan Potassium) .Marland Kitchen.. 1 Daily 6)  Coenzyme Q10 100 Mg Caps (Coenzyme Q10) .Marland Kitchen.. 1 Daily 7)  Protonix 40 Mg Tbec (Pantoprazole Sodium) .... One Daily 8)  Align  Caps (Probiotic Product) .... Once Daily  Allergies: 1)  ! Pcn 2)  ! Biaxin 3)  * Contrast Dye  Past History:  Past Medical History: 1. Coronary artery disease.  Left heart catheterization was done in September 2009 because of exertional chest pain.  It showed an EF of 60% on left ventriculogram, RCA  was totally occluded proximally with left-to-right collaterals, the distal left main had a 20% lesion, there was a small ramus with a 60-70% stenosis, and the mid LAD had  serial 60% stenoses with a distal subtotalled LAD.  There was a 50% first diagonal stenosis and a 50% first OM stenosis. There were poor distal targets for bypass surgery and no good interventional target so medical management was planned. Myoview in [place] in 3/11 was normal per report.  2. Hypertension. 3. Peptic ulcer disease. 4. Hysterectomy. 5. History of  HBV, HCV 6. History of chronic cough.  ACEI stopped.  Possible contribution from GERD. 7. History of PVCs and PACs.  8. GERD 9. IBS 10.  Depression 11. Hyperlipidemia 12. Obesity 13. h/o pancreatitis 14. Osteoporosis 15. Knee osteoarthritis 16. Echo (3/10): EF 55-60%, no regional wall motion abnormalities, aortic sclerosis.  Echo (3/11) in [place]: EF 60%, grade I diastolic dysfunction, normal RV, no regional wall motion abnormalities, valves are ok.  17. PFTs (10/10): FVC 103%, FEV1 109%, ratio 74%.  Mild obstruction.   Family History: Reviewed history from 02/11/2009 and no changes required. Noncontributory.  Social History: Reviewed history from 02/13/2009 and no changes required. The patient does not drink or smoke (never smoked).  She lives with her husband.   Review of Systems       All systems reviewed and negative except as per HPI.   Vital Signs:  Patient profile:   75 year old female Height:      62 inches Weight:      188 pounds BMI:     34.51 Pulse rate:   61 / minute BP sitting:   102 / 60  (right arm) Cuff size:   large  Vitals Entered By: Oswald Hillock (September 09, 2009 11:50 AM)  Physical Exam  General:  Well developed, well nourished, in no acute distress. Neck:  Neck supple, no JVD. No masses, thyromegaly or abnormal cervical nodes. Lungs:  Clear bilaterally to auscultation and percussion. Heart:  Non-displaced PMI, chest non-tender; regular rate and rhythm, S1, S2 without rubs or gallops. 2/6 early systolic ejection murmur at RUSB.  Carotid upstroke normal, no bruit.  Pedals normal pulses. No edema, no varicosities. Abdomen:  Bowel sounds positive; abdomen soft and non-tender without masses, organomegaly, or hernias noted. No hepatosplenomegaly. Extremities:  No clubbing or cyanosis. Neurologic:  Alert and oriented x 3. Psych:  Normal affect.   Impression & Recommendations:  Problem # 1:  CAD, NATIVE VESSEL (ICD-414.01) Patient has extensive CAD but no good options for revascularization.  Her EF is preserved without resting wall motion abnormalities on recent echo in  [place].  Myoview in [place] showed no ischemia.  She has not had any chest pain.  She will continue ASA, Plavix, Toprol XL, and losartan. She needs to start back on Crestor.  I do not think that it caused her cough and shortness of breath but she will let us know if she starts coughing after starting back on Crestor.   Problem # 2:  HYPERLIPIDEMIA (ICD-272.4) Restart Crestor as above.   Problem # 3:  HYPERTENSION (ICD-401.9) Hypotensive episode when fasting.  SBP running in the 100s-110s.  I will have her cut back on losartan to 25 mg daily.  I suggested that she avoid dieting by fasting.   Patient Instructions: 1)  Your physician has recommended you make the following change in your medication:  2)  Decrease Losartan to 25mg  daily 3)  Restart Crestor 10mg  daily--call me if  you cannot tolerate this medication. 4)  Your physician wants you to follow-up in: 6 months with Dr Shirlee Latch.   You will receive a reminder letter in the mail two months in advance. If you don't receive a letter, please call our office to schedule the follow-up appointment. Prescriptions: CRESTOR 10 MG TABS (ROSUVASTATIN CALCIUM) one tablet daily  #30 x 6   Entered by:   Katina Dung, RN, BSN   Authorized by:   Marca Ancona, MD   Signed by:   Katina Dung, RN, BSN on 09/09/2009   Method used:   [facility]ically to        [facility]  [id number]       [address]       [place], [place]  [address]       Ph: [phone] or [phone]       Fax: [phone]   RxID:   [id number] LOSARTAN POTASSIUM 25 MG TABS (LOSARTAN POTASSIUM) one tablet daily  #30 x 6   Entered by:   Katina Dung, RN, BSN   Authorized by:   Marca Ancona, MD   Signed by:   Katina Dung, RN, BSN on 09/09/2009   Method used:   [facility]        [id number]       [address]       [place], [place]  [address]       Ph: [phone] or [phone]        Fax: [phone]   RxID:   [id number]

## 2010-07-06 NOTE — Assessment & Plan Note (Signed)
Summary: rov   Primary Provider:  Dr. [facility]  CC:  pt in with edema in left leg.  Pt holding asa and metoprolol.  History of Present Illness: [name] [name] returns for an unscheduled visit today due to leg swelling.  She reports left>right lower leg swelling for over a week and wants to know if she could have a blood clot. No recent long trips, surgery, or other cause for immobilization. Symptomatically, she is doing quite well with no wheezing.  No chest pain or dyspnea.  Her beta blocker and aspirin have both been stopped in an attempt to see if any of her medications are causing bronchospasm.  BP is running higher off Toprol XL (174/98 today).   Lower extremity venous ultrasound done today shows no DVT.    Labs (6/11): LDL 101, HDL 53  Current Medications (verified): 1)  Plavix 75 Mg Tabs (Clopidogrel Bisulfate) .... Take One Tablet By Mouth Daily 2)  Nitroglycerin 0.4 Mg Subl (Nitroglycerin) .Marland Kitchen.. 1 Tablet Under Tongue As Directed 3)  Toprol Xl 50 Mg Xr24h-Tab (Metoprolol Succinate) .... One and One-Half Daily- On Hold 4)  Aspirin 81 Mg Tabs (Aspirin) .... Once Daily Hold 5)  Losartan Potassium 25 Mg Tabs (Losartan Potassium) .... Take 1 Tablet By Mouth Once Daily 6)  Coenzyme Q10 100 Mg Caps (Coenzyme Q10) .Marland Kitchen.. 1 Daily 7)  Protonix 40 Mg Tbec (Pantoprazole Sodium) .... One Daily As Needed 8)  Align  Caps (Probiotic Product) .... Once Daily 9)  Crestor 20 Mg Tabs (Rosuvastatin Calcium) .... One Tablet Daily 10)  Multivitamins   Tabs (Multiple Vitamin) .... Take 1 Tablet By Mouth Once A Day 11)  Nasonex 50 Mcg/act Susp (Mometasone Furoate) .Marland Kitchen.. 1 Spray Each Nostril At Bedtime   Lot # [id number] Exp: Feb 2013 As Needed 12)  Advair Diskus 250-50 Mcg/dose Aepb (Fluticasone-Salmeterol) .Marland Kitchen.. 1 Puff Two Times A Day 13)  Albuterol Inhaler .Marland Kitchen.. 1 Puff As Needed 14)  Albuterol Sulfate (2.5 Mg/68ml) 0.083% Nebu (Albuterol Sulfate) .... Four Times A Day 15)  Prednisone 20 Mg Tabs  (Prednisone) .... Take One Tablet Qd  Allergies (verified): 1)  ! Pcn 2)  ! Biaxin 3)  * Contrast Dye  Past History:  Past Medical History: 1. Coronary artery disease.  Left heart catheterization was done in September 2009 because of exertional chest pain.  It showed an EF of 60% on left ventriculogram, RCA was totally occluded proximally with left-to-right collaterals, the distal left main had a 20% lesion, there was a small ramus with a 60-70% stenosis, and the mid LAD had  serial 60% stenoses with a distal subtotalled LAD.  There was a 50% first diagonal stenosis and a 50% first OM stenosis. There were poor distal targets for bypass surgery and no good interventional target so medical management was planned. Myoview in [place] in 3/11 was normal per report.  2. Hypertension. 3. Peptic ulcer disease. 4. Hysterectomy. 5. History of  HBV, HCV 6. History of chronic cough.  ACEI stopped.  Possible contribution from GERD. 7. History of PVCs and PACs.  8. GERD 9. IBS 10. Depression 11. Hyperlipidemia 12. Obesity 13. h/o pancreatitis 14. Osteoporosis 15. Knee osteoarthritis 16. Dyspnea/cough: Echo (3/10): EF 55-60%, no regional wall motion abnormalities, aortic sclerosis.  Echo (3/11) in [place]: EF 60%, grade I diastolic dysfunction, normal RV, no regional wall motion abnormalities, valves are ok.  PFTs (10/10): FVC 103%, FEV1 109%, ratio 74%.  Mild obstruction.  CT chest (6/11) did not show evidence for  interstitial lung disease.  Possible asthma or upper airways cough syndrome.  Aspirin and beta blocker have been held as potential causes for bronchospasm.    Family History: Reviewed history from 10/29/2009 and no changes required. Family History Asthma-sister, mother Family History Emphysema-sister Family History Rheumatoid Arthritis-mother Family History Colon Cancer-maternal aunt Family History Lung Cancer-maternal aunt Pancreatic Cancer-brother Family History C V A / Stroke  -mother  Social History: Reviewed history from 10/29/2009 and no changes required. The patient does not drink or smoke (never smoked).   Marital Status:Married lives with husband  Children: Yes Occupation: Retired [employer]  Patient never smoked. (2nd hand smoke exposure x 40 years)  Vital Signs:  Patient profile:   75 year old female Height:      62.5 inches Weight:      189 pounds BMI:     34.14 Pulse rate:   71 / minute Pulse rhythm:   regular BP sitting:   174 / 98  (left arm) Cuff size:   regular  Vitals Entered By: Judithe Modest CMA (January 29, 2010 3:43 PM)  Physical Exam  General:  Well developed, well nourished, in no acute distress. Neck:  Neck supple, no JVD. No masses, thyromegaly or abnormal cervical nodes. Lungs:  Clear bilaterally to auscultation and percussion. Heart:  Non-displaced PMI, chest non-tender; regular rate and rhythm, S1, S2 without rubs or gallops. 2/6 early systolic ejection murmur at RUSB.  Carotid upstroke normal, no bruit.  Pedals normal pulses. 1+ left ankle edema, trace right ankle edema.  Abdomen:  Bowel sounds positive; abdomen soft and non-tender without masses, organomegaly, or hernias noted. No hepatosplenomegaly. Extremities:  No clubbing or cyanosis. Neurologic:  Alert and oriented x 3. Psych:  Normal affect.   Impression & Recommendations:  Problem # 1:  REACTIVE AIRWAY DISEASE (ICD-493.90) Doing better symptomatically.  She is off aspirin and Toprol now as possible causes of bronchospasm.  She is on Plavix.  She has tried nebivolol in the past but got hives.  Bisoprolol would be a possible beta blocker option (more beta-1 selective than Toprol) if pulmonary thinks it would be safe to start her on this.   Problem # 2:  CAD, NATIVE VESSEL (ICD-414.01) Patient has extensive CAD but no good options for revascularization.  Her EF was preserved without resting wall motion abnormalities on recent echo in [place].   Myoview in [place] showed no ischemia.  She has not had any chest pain.  She will continue Plavix, Crestor, and losartan.  There is concern that aspirin or Toprol were causing bronchospasm, so they are currently on hold.    Problem # 3:  MURMUR (ICD-785.2) Likely from aortic sclerosis  Problem # 4:  HYPERLIPIDEMIA (ICD-272.4) Statin was recently intensified.  She says she will get lipids done at her PCP's office next week.  I've asked her to send [place] a copy.   Problem # 5:  EDEMA/SWELLING Patient has mild, asymmetric L>R ankle swelling.  No DVT on venous ultrasound. Probable venous insufficiency versus mild diastolic CHF.  No exertional dyspnea so really no need to start Lasix.  I advised her to avoid salt and keep her legs elevated when seated.   Problem # 6:  HYPERTENSION (ICD-401.9) BP running high off Toprol XL.  Increase losartan to 50 mg daily.  BMET in 2 wks.  If pulmonary thinks that bronchospastic risk would not be too great, would restart beta blockade with more beta-1 selective bisoprolol (had allergic reaction to nebivolol).  Patient Instructions: 1)  Your physician has recommended you make the following change in your medication: 2)  Increase Losartan to 50mg  daily. 3)  Your physician recommends that you return for lab work in: 2 weeks--BMP 782.3  401.1 4)  Appointment with Dr Shirlee Latch in December. Prescriptions: LOSARTAN POTASSIUM 50 MG TABS (LOSARTAN POTASSIUM) one daily  #30 x 11   Entered by:   Katina Dung, RN, BSN   Authorized by:   Marca Ancona, MD   Signed by:   Katina Dung, RN, BSN on 01/29/2010   Method used:   [id number]       [address]       [place], [place]  [address]       Ph: [phone] or [phone]       Fax: [phone]   RxID:   [id number]    Appended Document: rov ok to rechallenge with bisoprolol - selective beta blockade  Appended Document: rov Per Dr. Vassie Loll, it  should be ok to try her on bisoprolol.  Would start 2.5 mg daily.   Appended Document: rov LMTCB   Appended Document: rov discussed with pt by telephone   Clinical Lists Changes  Medications: Added new medication of BISOPROLOL FUMARATE 5 MG TABS (BISOPROLOL FUMARATE) one half tablet daily - Signed Rx of BISOPROLOL FUMARATE 5 MG TABS (BISOPROLOL FUMARATE) one half tablet daily;  #15 x 11;  Signed;  Entered by: Katina Dung, RN, BSN;  Authorized by: Marca Ancona, MD;  Method used: [facility]  [id number] [address] [place], [place]  [address], Ph: [phone] or [phone], Fax: [phone] Observations: Added new observation of MEDRECON: current updated (02/09/2010 19:10)    Prescriptions: BISOPROLOL FUMARATE 5 MG TABS (BISOPROLOL FUMARATE) one half tablet daily  #15 x 11   Entered by:   Katina Dung, RN, BSN   Authorized by:   Marca Ancona, MD   Signed by:   Katina Dung, RN, BSN on 02/09/2010   Method used:   [facility]        [id number]       [address]       [place], [place]  [address]       Ph: [phone] or [phone]       Fax: [phone]   RxID:   [id number]     Current Medications (verified): 1)  Plavix 75 Mg Tabs (Clopidogrel Bisulfate) .... Take One Tablet By Mouth Daily 2)  Nitroglycerin 0.4 Mg Subl (Nitroglycerin) .Marland Kitchen.. 1 Tablet Under Tongue As Directed 3)  Toprol Xl 50 Mg Xr24h-Tab (Metoprolol Succinate) .... One and One-Half Daily- On Hold 4)  Aspirin 81 Mg Tabs (Aspirin) .... Once Daily Hold 5)  Losartan Potassium 50 Mg Tabs (Losartan Potassium) .... One Daily 6)  Coenzyme Q10 100 Mg Caps (Coenzyme Q10) .Marland Kitchen.. 1 Daily 7)  Protonix 40 Mg Tbec (Pantoprazole Sodium) .... One Daily As Needed 8)  Align  Caps (Probiotic Product) .... Once Daily 9)  Crestor 20 Mg Tabs (Rosuvastatin Calcium) .... One Tablet Daily 10)  Multivitamins   Tabs (Multiple Vitamin) ....  Take 1 Tablet By Mouth Once A Day 11)  Nasonex 50 Mcg/act Susp (Mometasone Furoate) .Marland Kitchen.. 1 Spray Each Nostril At Bedtime   Lot # [id number] Exp: Feb 2013 As Needed 12)  Advair  Diskus 250-50 Mcg/dose Aepb (Fluticasone-Salmeterol) .Marland Kitchen.. 1 Puff Two Times A Day 13)  Ventolin Hfa 108 (90 Base) Mcg/act Aers (Albuterol Sulfate) .... Inhale 2 Puffs Every Four Hours As Needed 14)  Albuterol Sulfate (2.5 Mg/1ml) 0.083% Nebu (Albuterol Sulfate) .... Four Times A Day 15)  Prednisone 20 Mg Tabs (Prednisone) .... Take One Tablet Qd 16)  Bisoprolol Fumarate 5 Mg Tabs (Bisoprolol Fumarate) .... One Half Tablet Daily  Allergies: 1)  ! Pcn 2)  ! Biaxin 3)  * Contrast Dye

## 2010-07-06 NOTE — Progress Notes (Signed)
Summary: discuss visit with rhuemotology  Phone Note Call from Patient Call back at Home Phone [phone] Call back at [phone]   Caller: Patient Reason for Call: Talk to Nurse Details for Reason: pt referral to rheumatology by pcp. discuss visit .  Initial call taken by: Lorne Skeens,  February 18, 2010 1:06 PM  Follow-up for Phone Call        pt saw Dr Dareen Piano last week about her joint pain--pt states Dr Dareen Piano thought Crestor and Prednisone were causing her problems--I reviewed with Dr Wilmon Pali for pt to D/C Crestor,she will also D/C CoQ10--pt also states that since she has been taking Bisoprolol she has had a terrible headache with stomach pain and nausea--I reviewed with Dr Wilmon Pali for pt to D/C bisoprolol      Current Medications (verified): 1)  Plavix 75 Mg Tabs (Clopidogrel Bisulfate) .... Take One Tablet By Mouth Daily 2)  Nitroglycerin 0.4 Mg Subl (Nitroglycerin) .Marland Kitchen.. 1 Tablet Under Tongue As Directed 3)  Toprol Xl 50 Mg Xr24h-Tab (Metoprolol Succinate) .... One and One-Half Daily- On Hold 4)  Aspirin 81 Mg Tabs (Aspirin) .... Once Daily Hold 5)  Losartan Potassium 50 Mg Tabs (Losartan Potassium) .... One Daily 6)  Protonix 40 Mg Tbec (Pantoprazole Sodium) .... One Daily As Needed 7)  Align  Caps (Probiotic Product) .... Once Daily 8)  Multivitamins   Tabs (Multiple Vitamin) .... Take 1 Tablet By Mouth Once A Day 9)  Nasonex 50 Mcg/act Susp (Mometasone Furoate) .Marland Kitchen.. 1 Spray Each Nostril At Bedtime   Lot # [id number] Exp: Feb 2013 As Needed 10)  Advair Diskus 250-50 Mcg/dose Aepb (Fluticasone-Salmeterol) .Marland Kitchen.. 1 Puff Two Times A Day 11)  Ventolin Hfa 108 (90 Base) Mcg/act Aers (Albuterol Sulfate) .... Inhale 2 Puffs Every Four Hours As Needed 12)  Albuterol Sulfate (2.5 Mg/67ml) 0.083% Nebu (Albuterol Sulfate) .... Four Times A Day 13)  Prednisone 20 Mg Tabs (Prednisone) .... Take One Tablet Qd  Allergies: 1)  ! Pcn 2)  ! Biaxin 3)  * Contrast Dye

## 2010-07-06 NOTE — Letter (Signed)
Summary: [facility]   Imported By: Kassie Mends 04/29/2009 14:58:43  _____________________________________________________________________  External Attachment:    Type:   Image     Comment:   External Document

## 2010-07-06 NOTE — Assessment & Plan Note (Signed)
Summary: cough chest pain sweating/ mbw   Visit Type:  Acute Visit  Primary Provider/Referring Provider:  Dr. [facility]  CC:  Pt c/o productive cough with clear mucus worse when laying down, wheezing, chest tightness, weakness, sweating, increased SOB, and and unable to sleep. Pt c/o recurrent heaviness and pain starting at shoulder and radiating to elbow.  History of Present Illness: 10/29/09 - initial 74/F never smoker with CAD for evaluation of cough & wheezing x 3-4 months due to reactive airways. This was of insidious onset  - no clear URI symptoms preceding, seemed to get worse while working in her garden & at night - initially treated for bronchitis with neb inthe office & oral prednisone & antibiotic but cough returned as soon as she completed pred course. A cortisone shot inher left knee seems to have given her the greatest relief. She had an ER visit during a trip to [place], reviewed dc summary, was told she had 'congestive heart failure'  - echoi nml LV fn, BNP 55, stress myoview nml, given cipro for 'bronchitis.' Review of CXR 08/10/09 shows bibasal interstitial prominence, lt volume loss with raised hemid iaphragm CT angio chest 8/09 was neg for PE, stable RUL 5mm nodule as compared to 05/2005, no obvious fibrosis. She reports good control of heartburn with protonix , had an EGD by Dr Loreta Ave in 11/10 PFTs in 10/10 were essentially nml, no obstruction, nml lung volumes & diffusion  November 23, 2009 3:44 PM  Cough/ wheezing much improved with oral prednisone - not taking qvar daily since better. She had a sleep study at [facility] neurology. She is needing cortisone shots for her knee every month & would like to proceed with surgery. Spirometry shows nml lung function  December 10, 2009 2:36 PM  c/o productive cough with clear mucus worse when laying down, wheezing, chest tightness, weakness, sweating, increased SOB, and and unable to sleep. Pt c/o recurrent heaviness and pain  starting at shoulder and radiating to elbow. She was on qvar- no clear environmental or infectious trigger - did some garden work with flowers. She denies seasonal allergies. States her symptoms have been going on since cardiac meds were started 3 yrs ago - note on toprol   Current Medications (verified): 1)  Plavix 75 Mg Tabs (Clopidogrel Bisulfate) .... Take One Tablet By Mouth Daily 2)  Nitroglycerin 0.4 Mg Subl (Nitroglycerin) .Marland Kitchen.. 1 Tablet Under Tongue As Directed 3)  Toprol Xl 50 Mg Xr24h-Tab (Metoprolol Succinate) .... Take 1 1/2 Tablet By Mouth Once A Day 4)  Aspirin 81 Mg Tabs (Aspirin) .... Once Daily 5)  Losartan Potassium 25 Mg Tabs (Losartan Potassium) .... Take 1 Tablet By Mouth Two Times A Day 6)  Coenzyme Q10 100 Mg Caps (Coenzyme Q10) .Marland Kitchen.. 1 Daily 7)  Protonix 40 Mg Tbec (Pantoprazole Sodium) .... One Daily 8)  Align  Caps (Probiotic Product) .... Once Daily 9)  Crestor 20 Mg Tabs (Rosuvastatin Calcium) .... One Tablet Daily 10)  Multivitamins   Tabs (Multiple Vitamin) .... Take 1 Tablet By Mouth Once A Day 11)  Nasonex 50 Mcg/act Susp (Mometasone Furoate) .Marland Kitchen.. 1 Spray Each Nostril At Bedtime   Lot # [id number] Exp: Feb 2013 12)  Qvar 80 Mcg/act Aers (Beclomethasone Dipropionate) .Marland Kitchen.. 1 Puff Two Times A Day 13)  Mucinex Dm 30-600 Mg Xr12h-Tab (Dextromethorphan-Guaifenesin) .... Take 1 Tablet By Mouth Once A Day  Allergies (verified): 1)  ! Pcn 2)  ! Biaxin 3)  * Contrast Dye  Past History:  Past Surgical History: Last updated: 10/29/2009 Cholecystectomy Hysterectomy Pancreatitic Duct Stent Tubal Ligation-1995  Social History: Last updated: 10/29/2009 The patient does not drink or smoke (never smoked).   Marital Status:Married lives with husband  Children: Yes Occupation: Retired [employer]  Patient never smoked. (2nd hand smoke exposure x 40 years)  Review of Systems       The patient complains of dyspnea on exertion.  The patient denies anorexia,  fever, weight loss, weight gain, vision loss, decreased hearing, hoarseness, chest pain, syncope, peripheral edema, prolonged cough, headaches, hemoptysis, abdominal pain, melena, hematochezia, severe indigestion/heartburn, hematuria, muscle weakness, suspicious skin lesions, difficulty walking, depression, unusual weight change, and abnormal bleeding.    Vital Signs:  Patient profile:   75 year old female Height:      62.5 inches Weight:      186 pounds O2 Sat:      97 % on Room air Temp:     98.4 degrees F oral Pulse rate:   44 / minute BP sitting:   122 / 72  (right arm) Cuff size:   regular  Vitals Entered By: Zackery Barefoot CMA (December 10, 2009 2:27 PM)  O2 Flow:  Room air CC: Pt c/o productive cough with clear mucus worse when laying down, wheezing, chest tightness, weakness, sweating,  increased SOB, and unable to sleep. Pt c/o recurrent heaviness and pain starting at shoulder and radiating to elbow Comments Medications reviewed with patient Verified contact number and pharmacy with patient Zackery Barefoot CMA  December 10, 2009 2:27 PM    Physical Exam  Additional Exam:  Gen. Pleasant, well-nourished, in no distress, normal affect ENT - no lesions, no post nasal drip Neck: No JVD, no thyromegaly, no carotid bruits Lungs: no use of accessory muscles, no dullness to percussion, no rales , diffuse rhonchi  Cardiovascular: Rhythm regular, heart sounds  normal, no murmurs or gallops, no peripheral edema Musculoskeletal: No deformities, no cyanosis or clubbing      Impression & Recommendations:  Problem # 1:  REACTIVE AIRWAY DISEASE (ICD-493.90) Unclear trigger - will check RAST for indoor/ outdoor allergens. Trial of advair 250/50 two times a day  - RINSE mouth  Take albuterol MDI as needed for shohrtness of  breeath Take zyrtec once daily OTC Prednisone Take  2 tabs daily x 7 days, then 1 tab daily x7 days then stop.  Will have to hold off surgery nutil better control of  this problem  Medications Added to Medication List This Visit: 1)  Mucinex Dm 30-600 Mg Xr12h-tab (Dextromethorphan-guaifenesin) .... Take 1 tablet by mouth once a day 2)  Prednisone 10 Mg Tabs (Prednisone) .... Take  2 tabs daily x 7 days, then 1 tab daily x7 days then stop. #30  Other Orders: T-"RAST" (Allergy Full Profile) IGE ([id number]) Est. Patient Level IV (09811) Prescription Created Electronically [phone])  Patient Instructions: 1)  Copy sent to: Dr Ferdie Ping medical 2)  Please schedule a follow-up appointment in 3 weeks. 3)  Blood work for allergy testing 4)  Trial of advair 250/50 two times a day  - RINSE mouth  5)  Take albuterol MDI as needed for shohrtness of  breeath 6)  Take zyrtec once daily OTC 7)  Prednisone Take  2 tabs daily x 7 days, then 1 tab daily x7 days then stop. #30  8)  Call if no btter by Monday Prescriptions: PREDNISONE 10 MG TABS (PREDNISONE) Take  2 tabs daily x 7 days, then  1 tab daily x7 days then stop. #30  #30 x 0   Entered and Authorized by:   Comer Locket Vassie Loll MD   Signed by:   Comer Locket Vassie Loll MD on 12/10/2009   Method used:   [facility] to        [facility]  [id number]       [address]       [place], [place]  [address]       Ph: [phone] or [phone]       Fax: [phone]   RxID:   [id number]   Appended Document: cough chest pain sweating/ mbw Thur[place] Hole, please ask Ms Giraldo to stop Toprol XL and use nebivolol 5 mg daily instead (? trigger for bronchospasm.   Appended Document: cough chest pain sweating/ mbw discussed with pt by telephone--she agreed to stop Toprol and start Nebivolol 5mg  daily   Clinical Lists Changes  Medications: Changed medication from TOPROL XL 50 MG XR24H-TAB (METOPROLOL SUCCINATE) Take 1 1/2 tablet by mouth once a day to BYSTOLIC 5 MG TABS (NEBIVOLOL HCL) one daily - Signed Rx of BYSTOLIC 5 MG TABS (NEBIVOLOL HCL) one daily;  #30 x 11;  Signed;  Entered  by: Katina Dung, RN, BSN;  Authorized by: Marca Ancona, MD;  Method used: [facility]  [id number] [address] [place], [place]  [address], Ph: [phone] or [phone], Fax: [phone] Observations: Added new observation of MEDRECON: current updated (12/14/2009 12:00)    Prescriptions: BYSTOLIC 5 MG TABS (NEBIVOLOL HCL) one daily  #30 x 11   Entered by:   Katina Dung, RN, BSN   Authorized by:   Marca Ancona, MD   Signed by:   Katina Dung, RN, BSN on 12/14/2009   Method used:   [facility]        [id number]       [address]       [place], [place]  [address]       Ph: [phone] or [phone]       Fax: [phone]   RxID:   [id number]     Current Medications (verified): 1)  Plavix 75 Mg Tabs (Clopidogrel Bisulfate) .... Take One Tablet By Mouth Daily 2)  Nitroglycerin 0.4 Mg Subl (Nitroglycerin) .Marland Kitchen.. 1 Tablet Under Tongue As Directed 3)  Bystolic 5 Mg Tabs (Nebivolol Hcl) .... One Daily 4)  Aspirin 81 Mg Tabs (Aspirin) .... Once Daily 5)  Losartan Potassium 25 Mg Tabs (Losartan Potassium) .... Take 1 Tablet By Mouth Two Times A Day 6)  Coenzyme Q10 100 Mg Caps (Coenzyme Q10) .Marland Kitchen.. 1 Daily 7)  Protonix 40 Mg Tbec (Pantoprazole Sodium) .... One Daily 8)  Align  Caps (Probiotic Product) .... Once Daily 9)  Crestor 20 Mg Tabs (Rosuvastatin Calcium) .... One Tablet Daily 10)  Multivitamins   Tabs (Multiple Vitamin) .... Take 1 Tablet By Mouth Once A Day 11)  Nasonex 50 Mcg/act Susp (Mometasone Furoate) .Marland Kitchen.. 1 Spray Each Nostril At Bedtime   Lot # [id number] Exp: Feb 2013 12)  Mucinex Dm 30-600 Mg Xr12h-Tab (Dextromethorphan-Guaifenesin) .... Take 1 Tablet By Mouth Once A Day 13)  Prednisone 10 Mg Tabs (Prednisone) .... Take  2 Tabs Daily X 7 Days, Then 1 Tab Daily X7 Days Then Stop. #30  Allergies: 1)  ! Pcn 2)  ! Biaxin 3)  * Contrast Dye

## 2010-07-06 NOTE — Assessment & Plan Note (Signed)
Summary: sob/cough/apc   Visit Type:  Follow-up Primary Provider/Referring Provider:  Dr. [facility]  CC:  Pt here for follow up. Pt c/o headaches starting last week, SOB, wheezing, and and cough .  History of Present Illness: 74/F never smoker with CAD for evaluation of cough & wheezing x 3-4 months attributed to reactive airways. This was of insidious onset  - no clear URI symptoms preceding, seemed to get worse while working in her garden & at night - initially treated for bronchitis with neb inthe office & oral prednisone & antibiotic but cough returned as soon as she completed pred course. A cortisone shot inher left knee seems to have given her the greatest relief. She had an ER visit during a trip to [place], reviewed dc summary, was told she had 'congestive heart failure'  - echoi nml LV fn, BNP 55, stress myoview nml, given cipro for 'bronchitis.' Review of CXR 08/10/09 shows bibasal interstitial prominence, lt volume loss with raised hemid iaphragm CT angio chest 8/09 was neg for PE, stable RUL 5mm nodule as compared to 05/2005, no obvious fibrosis. She reports good control of heartburn with protonix , had an EGD by Dr Loreta Ave in 11/10 She denies seasonal allergies PFTs in 10/10 were essentially nml, no obstruction, nml lung volumes & diffusion  November 23, 2009 3:44 PM  Cough/ wheezing much improved with oral prednisone - not taking qvar daily since better. She had a sleep study at [facility] neurology. She is needing cortisone shots for her knee every month & would like to proceed with surgery. Spirometry shows nml lung function  Current Medications (verified): 1)  Plavix 75 Mg Tabs (Clopidogrel Bisulfate) .... Take One Tablet By Mouth Daily 2)  Nitroglycerin 0.4 Mg Subl (Nitroglycerin) .Marland Kitchen.. 1 Tablet Under Tongue As Directed 3)  Toprol Xl 50 Mg Xr24h-Tab (Metoprolol Succinate) .... Take 1 1/2 Tablet By Mouth Once A Day 4)  Aspirin 81 Mg Tabs (Aspirin) .... Once Daily 5)   Losartan Potassium 25 Mg Tabs (Losartan Potassium) .... Take 1 Tablet By Mouth Two Times A Day 6)  Coenzyme Q10 100 Mg Caps (Coenzyme Q10) .Marland Kitchen.. 1 Daily 7)  Protonix 40 Mg Tbec (Pantoprazole Sodium) .... One Daily 8)  Align  Caps (Probiotic Product) .... Once Daily 9)  Crestor 10 Mg Tabs (Rosuvastatin Calcium) .... One Tablet Daily 10)  Multivitamins   Tabs (Multiple Vitamin) .... Take 1 Tablet By Mouth Once A Day 11)  Nasonex 50 Mcg/act Susp (Mometasone Furoate) .Marland Kitchen.. 1 Spray Each Nostril At Bedtime   Lot # [id number] Exp: Feb 2013 12)  Imdur 30 Mg Xr24h-Tab (Isosorbide Mononitrate) .... One Tablet Daily  Allergies (verified): 1)  ! Pcn 2)  ! Biaxin 3)  * Contrast Dye  Past History:  Past Medical History: Last updated: 09/09/2009 1. Coronary artery disease.  Left heart catheterization was done in September 2009 because of exertional chest pain.  It showed an EF of 60% on left ventriculogram, RCA was totally occluded proximally with left-to-right collaterals, the distal left main had a 20% lesion, there was a small ramus with a 60-70% stenosis, and the mid LAD had  serial 60% stenoses with a distal subtotalled LAD.  There was a 50% first diagonal stenosis and a 50% first OM stenosis. There were poor distal targets for bypass surgery and no good interventional target so medical management was planned. Myoview in [place] in 3/11 was normal per report.  2. Hypertension. 3. Peptic ulcer disease. 4. Hysterectomy. 5. History of  HBV, HCV 6. History of chronic cough.  ACEI stopped.  Possible contribution from GERD. 7. History of PVCs and PACs.  8. GERD 9. IBS 10. Depression 11. Hyperlipidemia 12. Obesity 13. h/o pancreatitis 14. Osteoporosis 15. Knee osteoarthritis 16. Echo (3/10): EF 55-60%, no regional wall motion abnormalities, aortic sclerosis.  Echo (3/11) in [place]: EF 60%, grade I diastolic dysfunction, normal RV, no regional wall motion abnormalities, valves are ok.  17. PFTs  (10/10): FVC 103%, FEV1 109%, ratio 74%.  Mild obstruction.   Social History: Last updated: 10/29/2009 The patient does not drink or smoke (never smoked).   Marital Status:Married lives with husband  Children: Yes Occupation: Retired [employer]  Patient never smoked. (2nd hand smoke exposure x 40 years)  Review of Systems       The patient complains of dyspnea on exertion and prolonged cough.  The patient denies anorexia, fever, weight loss, weight gain, vision loss, decreased hearing, hoarseness, chest pain, syncope, peripheral edema, headaches, hemoptysis, abdominal pain, melena, hematochezia, severe indigestion/heartburn, hematuria, muscle weakness, suspicious skin lesions, difficulty walking, depression, unusual weight change, and abnormal bleeding.    Vital Signs:  Patient profile:   75 year old female Height:      62 inches Weight:      188 pounds BMI:     34.51 O2 Sat:      97 % on Room air Temp:     98.4 degrees F oral Pulse rate:   66 / minute BP sitting:   108 / 70  (left arm) Cuff size:   large  Vitals Entered By: Zackery Barefoot CMA (November 23, 2009 3:12 PM)  O2 Flow:  Room air CC: Pt here for follow up. Pt c/o headaches starting last week, SOB, wheezing, and cough  Comments Medications reviewed with patient Verified contact number and pharmacy with patient Zackery Barefoot CMA  November 23, 2009 3:12 PM    Physical Exam  Additional Exam:  Gen. Pleasant, well-nourished, in no distress, normal affect ENT - no lesions, no post nasal drip Neck: No JVD, no thyromegaly, no carotid bruits Lungs: no use of accessory muscles, no dullness to percussion, clear without rales or rhonchi  Cardiovascular: Rhythm regular, heart sounds  normal, no murmurs or gallops, no peripheral edema Musculoskeletal: No deformities, no cyanosis or clubbing      Pulmonary Function Test Date: 11/23/2009 4:02 PM Gender: Female  Pre-Spirometry FVC    Value: 2.41 L/min   % Pred:  110.10 % FEV1    Value: 1.80 L     Pred: 1.69 L     % Pred: 106.40 % FEV1/FVC  Value: 74.67 %     % Pred: 97 %  Impression & Recommendations:  Problem # 1:  COUGH (ICD-786.2)  Doubt asthma - spirometry on 2 occasions - even while symptomatic have shown nml lung function. DD includes upper airway cough or GERD. Given good response of symptoms to oral steroids, will keep on qvar 80 two times a day x 3 months . OK to proceedw ith knee replacement once cough has subsided  - over the next few weeks. She is at mild moderate risk for post op pulmonary complications , especially if spinal anesthesia used. She may need cardiac clearance - defer to her cardiologist.  Orders: Prescription Created Electronically [phone]) Est. Patient Level III (95621) Spirometry w/Graph (94010)  Problem # 2:  GERD (ICD-530.81)  stay on protonix Her updated medication list for this problem includes:    Protonix 40  Mg Tbec (Pantoprazole sodium) ..... One daily  Orders: Prescription Created Electronically [phone]) Est. Patient Level III (95188) Spirometry w/Graph (94010)  Medications Added to Medication List This Visit: 1)  Losartan Potassium 25 Mg Tabs (Losartan potassium) .... Take 1 tablet by mouth two times a day 2)  Qvar 80 Mcg/act Aers (Beclomethasone dipropionate) .Marland Kitchen.. 1 puff two times a day  Patient Instructions: 1)  Copy sent to:Dr Norris  2)  Please schedule a follow-up appointment in 1 month with TP 3)  Take qvar 80 micrograms two times a day  - RINSE MOUTH every day. 4)  Breathing test showed good lung function 5)  Sleep study results Prescriptions: QVAR 80 MCG/ACT AERS (BECLOMETHASONE DIPROPIONATE) 1 puff two times a day  #1 x 2   Entered and Authorized by:   Comer Locket Vassie Loll MD   Signed by:   Comer Locket Vassie Loll MD on 11/23/2009   Method used:   [facility]  [id number]       [address]       [place], [place]  [address]       Ph:  [phone] or [phone]       Fax: [phone]   RxID:   [id number]    CardioPerfect Spirometry  ID: [id number] Patient: [name] [name], [name] [name] DOB: 1935-09-17 Age: 75 Years Old Sex: Female Race: Black Physician: Vassie Loll Height: 62 Weight: 188 Status: Confirmed Past Medical History:  1. Coronary artery disease.  Left heart catheterization was done in September 2009 because of exertional chest pain.  It showed an EF of 60% on left ventriculogram, RCA was totally occluded proximally with left-to-right collaterals, the distal left main had a 20% lesion, there was a small ramus with a 60-70% stenosis, and the mid LAD had  serial 60% stenoses with a distal subtotalled LAD.  There was a 50% first diagonal stenosis and a 50% first OM stenosis. There were poor distal targets for bypass surgery and no good interventional target so medical management was planned. Myoview in [place] in 3/11 was normal per report.  2. Hypertension. 3. Peptic ulcer disease. 4. Hysterectomy. 5. History of  HBV, HCV 6. History of chronic cough.  ACEI stopped.  Possible contribution from GERD. 7. History of PVCs and PACs.  8. GERD 9. IBS 10. Depression 11. Hyperlipidemia 12. Obesity 13. h/o pancreatitis 14. Osteoporosis 15. Knee osteoarthritis 16. Echo (3/10): EF 55-60%, no regional wall motion abnormalities, aortic sclerosis.  Echo (3/11) in [place]: EF 60%, grade I diastolic dysfunction, normal RV, no regional wall motion abnormalities, valves are ok.  17. PFTs (10/10): FVC 103%, FEV1 109%, ratio 74%.  Mild obstruction.   Recorded: 11/23/2009 4:02 PM  Parameter  Measured Predicted %Predicted FVC     2.41        2.19        110.10 FEV1     1.80        1.69        106.40 FEV1%   74.67        76.97        97 PEF    5.59        4.51        123.80   Interpretation: Within normal limits

## 2010-07-06 NOTE — Progress Notes (Signed)
Summary: B/P readings  Phone Note Outgoing Call   Call placed by: Katina Dung, RN, BSN,  April 13, 2009 3:56 PM Call placed to: Patient Summary of Call: B/P readings  Follow-up for Phone Call        Losartan 25mg   started 03/30/09--recent B/P readings 03/30/09 109/67 58- 03/31/09 142/80 67 04/01/09 109/75 55 04/02/09 121/69 67 04/03/09 129/69 67 04/04/09 121/83 55 04/05/09 128/88 45 04/06/09 140/90 67 04/07/09 140/92 53 04/08/09 146/84 72 04/09/09 127/75 58 04/10/09 145/67 58 04/11/09 148/67 58 04/13/09 145/95 58 pt prescribed Prednisone 20mg  for a cough 03/30/09 by Dr March Rummage day of Prednisone 04/14/09 besides cough pt feels good meds reviewed with patient  will forward to Dr Shirlee Latch for review      New/Updated Medications: COENZYME Q10 100 MG CAPS (COENZYME Q10) 1 daily  Appended Document: B/P readings Some high readings.  Increase losartan to 50 mg daily, BMET in 2 wks.   Appended Document: B/P readings pt agreed to increase Losartan to 50mg  daily--repeat BMP in 2 weeks 04/28/09

## 2010-07-06 NOTE — Assessment & Plan Note (Signed)
Summary: NP follow up - reactive airway disease   Primary Provider/Referring Provider:  Dr. [facility]  CC:  2 week follow up and states symtpoms have completely resolved.  no new complaints..  History of Present Illness: 10/29/09 - initial 75/F never smoker with CAD for evaluation of cough & wheezing x 3-4 months due to reactive airways. This was of insidious onset  - no clear URI symptoms preceding, seemed to get worse while working in her garden & at night - initially treated for bronchitis with neb inthe office & oral prednisone & antibiotic but cough returned as soon as she completed pred course. A cortisone shot inher left knee seems to have given her the greatest relief. She had an ER visit during a trip to [place]  - echoi nml LV fn, BNP 55, stress myoview nml, given cipro for 'bronchitis.' Review of CXR 08/10/09 shows bibasal interstitial prominence, lt volume loss with raised hemid iaphragm CT angio chest 8/09 was neg for PE, stable RUL 5mm nodule as compared to 05/2005, no obvious fibrosis. She reports good control of heartburn with protonix , had an EGD by Dr Loreta Ave in 11/10 PFTs in 10/10 were essentially nml, no obstruction, nml lung volumes & diffusion  November 23, 2009 3:44 PM  Cough/ wheezing much improved with oral prednisone - not taking qvar daily since better. She had a sleep study at [facility] neurology. She is needing cortisone shots for her knee every month. Spirometry shows nml lung function  January 04, 2010 11:12 AM  Exacerbation while on Toprol  required prednisone20 mg - did not tolerate nevibulol -rash. Developed bspasm again once prednisone tapered. Also c/o bruising over thighs & legs. Worried about her heart if toprol stopped. RAST neg  January 18, 2010 2:27 PM  ER visit on 01/09/10 - absolute eos count 800 ,BNP 69, CXR - cardiomegaly, was on prednisone . Diffuse wheezing & coughing today - ofered hospital admission but she has grandkids over. Given depot  shot January 19, 2010--Presents for follow up of RAD exacerbation. She was seen yesterday with RAD/asthmatic flare. Tx w/ depo medrol injection and steroids. She returns today much improved. with decreased cough and wheezing. She has not picked up rx at pharm but took 30mg  of prednisone this am. no discolored mucus or fever. Labs showed neg ANA, and low ESR. RA factor was elevated.   February 02, 2010--Presents for follow up for recent atypical asthma flare. Seen 2 weeks ago, tx w/ prednsione taper. She says she is totally back to baseline, feels so much better. No new complaints. Denies wheezing or cough. Denies chest pain, dyspnea, orthopnea, hemoptysis, fever, n/v/d, edema, headache,. Currently on 10mg  of prednisone.   Medications Prior to Update: 1)  Plavix 75 Mg Tabs (Clopidogrel Bisulfate) .... Take One Tablet By Mouth Daily 2)  Nitroglycerin 0.4 Mg Subl (Nitroglycerin) .Marland Kitchen.. 1 Tablet Under Tongue As Directed 3)  Toprol Xl 50 Mg Xr24h-Tab (Metoprolol Succinate) .... One and One-Half Daily- On Hold 4)  Aspirin 81 Mg Tabs (Aspirin) .... Once Daily Hold 5)  Losartan Potassium 50 Mg Tabs (Losartan Potassium) .... One Daily 6)  Coenzyme Q10 100 Mg Caps (Coenzyme Q10) .Marland Kitchen.. 1 Daily 7)  Protonix 40 Mg Tbec (Pantoprazole Sodium) .... One Daily As Needed 8)  Align  Caps (Probiotic Product) .... Once Daily 9)  Crestor 20 Mg Tabs (Rosuvastatin Calcium) .... One Tablet Daily 10)  Multivitamins   Tabs (Multiple Vitamin) .... Take 1 Tablet By Mouth Once A Day 11)  Nasonex 50 Mcg/act Susp (Mometasone Furoate) .Marland Kitchen.. 1 Spray Each Nostril At Bedtime   Lot # [id number] Exp: Feb 2013 As Needed 12)  Advair Diskus 250-50 Mcg/dose Aepb (Fluticasone-Salmeterol) .Marland Kitchen.. 1 Puff Two Times A Day 13)  Albuterol Inhaler .Marland Kitchen.. 1 Puff As Needed 14)  Albuterol Sulfate (2.5 Mg/15ml) 0.083% Nebu (Albuterol Sulfate) .... Four Times A Day 15)  Prednisone 20 Mg Tabs (Prednisone) .... Take One Tablet Qd  Current Medications (verified): 1)   Plavix 75 Mg Tabs (Clopidogrel Bisulfate) .... Take One Tablet By Mouth Daily 2)  Nitroglycerin 0.4 Mg Subl (Nitroglycerin) .Marland Kitchen.. 1 Tablet Under Tongue As Directed 3)  Toprol Xl 50 Mg Xr24h-Tab (Metoprolol Succinate) .... One and One-Half Daily- On Hold 4)  Aspirin 81 Mg Tabs (Aspirin) .... Once Daily Hold 5)  Losartan Potassium 50 Mg Tabs (Losartan Potassium) .... One Daily 6)  Coenzyme Q10 100 Mg Caps (Coenzyme Q10) .Marland Kitchen.. 1 Daily 7)  Protonix 40 Mg Tbec (Pantoprazole Sodium) .... One Daily As Needed 8)  Align  Caps (Probiotic Product) .... Once Daily 9)  Crestor 20 Mg Tabs (Rosuvastatin Calcium) .... One Tablet Daily 10)  Multivitamins   Tabs (Multiple Vitamin) .... Take 1 Tablet By Mouth Once A Day 11)  Nasonex 50 Mcg/act Susp (Mometasone Furoate) .Marland Kitchen.. 1 Spray Each Nostril At Bedtime   Lot # [id number] Exp: Feb 2013 As Needed 12)  Advair Diskus 250-50 Mcg/dose Aepb (Fluticasone-Salmeterol) .Marland Kitchen.. 1 Puff Two Times A Day 13)  Ventolin Hfa 108 (90 Base) Mcg/act Aers (Albuterol Sulfate) .... Inhale 2 Puffs Every Four Hours As Needed 14)  Albuterol Sulfate (2.5 Mg/72ml) 0.083% Nebu (Albuterol Sulfate) .... Four Times A Day 15)  Prednisone 20 Mg Tabs (Prednisone) .... Take One Tablet Qd  Allergies (verified): 1)  ! Pcn 2)  ! Biaxin 3)  * Contrast Dye  Past History:  Past Medical History: Last updated: 01/29/2010 1. Coronary artery disease.  Left heart catheterization was done in September 2009 because of exertional chest pain.  It showed an EF of 60% on left ventriculogram, RCA was totally occluded proximally with left-to-right collaterals, the distal left main had a 20% lesion, there was a small ramus with a 60-70% stenosis, and the mid LAD had  serial 60% stenoses with a distal subtotalled LAD.  There was a 50% first diagonal stenosis and a 50% first OM stenosis. There were poor distal targets for bypass surgery and no good interventional target so medical management was planned. Myoview in [place]  in 3/11 was normal per report.  2. Hypertension. 3. Peptic ulcer disease. 4. Hysterectomy. 5. History of  HBV, HCV 6. History of chronic cough.  ACEI stopped.  Possible contribution from GERD. 7. History of PVCs and PACs.  8. GERD 9. IBS 10. Depression 11. Hyperlipidemia 12. Obesity 13. h/o pancreatitis 14. Osteoporosis 15. Knee osteoarthritis 16. Dyspnea/cough: Echo (3/10): EF 55-60%, no regional wall motion abnormalities, aortic sclerosis.  Echo (3/11) in [place]: EF 60%, grade I diastolic dysfunction, normal RV, no regional wall motion abnormalities, valves are ok.  PFTs (10/10): FVC 103%, FEV1 109%, ratio 74%.  Mild obstruction.  CT chest (6/11) did not show evidence for interstitial lung disease.  Possible asthma or upper airways cough syndrome.  Aspirin and beta blocker have been held as potential causes for bronchospasm.    Past Surgical History: Last updated: 10/29/2009 Cholecystectomy Hysterectomy Pancreatitic Duct Stent Tubal Ligation-1995  Family History: Last updated: 10/29/2009 Family History Asthma-sister, mother Family History Emphysema-sister Family History Rheumatoid Arthritis-mother Family History  Colon Cancer-maternal aunt Family History Lung Cancer-maternal aunt Pancreatic Cancer-brother Family History C V A / Stroke -mother  Social History: Last updated: 10/29/2009 The patient does not drink or smoke (never smoked).   Marital Status:Married lives with husband  Children: Yes Occupation: Retired [employer]  Patient never smoked. (2nd hand smoke exposure x 40 years)  Risk Factors: Smoking Status: never (01/18/2010)  Review of Systems      See HPI  Vital Signs:  Patient profile:   75 year old female Height:      62.5 inches Weight:      189.38 pounds BMI:     34.21 O2 Sat:      96 % on Room air Temp:     99.0 degrees F oral Pulse rate:   72 / minute BP sitting:   140 / 98  (left arm) Cuff size:   regular  Vitals Entered By:  Boone Master CNA/MA (February 02, 2010 2:41 PM)  O2 Flow:  Room air CC: 2 week follow up, states symtpoms have completely resolved.  no new complaints. Is Patient Diabetic? No Comments Medications reviewed with patient Daytime contact number verified with patient. Boone Master CNA/MA  February 02, 2010 2:44 PM    Physical Exam  Additional Exam:  Gen. Pleasant, well-nourished, in no distress, normal affect ENT - no lesions, no post nasal drip Neck: No JVD, no thyromegaly, no carotid bruits Lungs: CTA bialterally w/ no wheezing.  Cardiovascular: Rhythm regular, heart sounds  normal, no murmurs or gallops, no peripheral edema Musculoskeletal: No deformities, no cyanosis or clubbing      Impression & Recommendations:  Problem # 1:  REACTIVE AIRWAY DISEASE (ICD-493.90) Recent flare now resolve w/ cough control and steroid taper.  Plan:  Taper off prednisone as scheduled.  Mucinex DM two times a day as needed cough/congestion   Stay on advair two times a day --brush/rinse and gargle after use.  Stop taking aspirin Nebuliser with albuterol three times a day as needed follow up 6 weeks Dr. Vassie Loll  Please contact office for sooner follow up as needed   Medications Added to Medication List This Visit: 1)  Ventolin Hfa 108 (90 Base) Mcg/act Aers (Albuterol sulfate) .... Inhale 2 puffs every four hours as needed  Complete Medication List: 1)  Plavix 75 Mg Tabs (Clopidogrel bisulfate) .... Take one tablet by mouth daily 2)  Nitroglycerin 0.4 Mg Subl (Nitroglycerin) .Marland Kitchen.. 1 tablet under tongue as directed 3)  Toprol Xl 50 Mg Xr24h-tab (Metoprolol succinate) .... One and one-half daily- on hold 4)  Aspirin 81 Mg Tabs (Aspirin) .... Once daily hold 5)  Losartan Potassium 50 Mg Tabs (Losartan potassium) .... One daily 6)  Coenzyme Q10 100 Mg Caps (Coenzyme q10) .Marland Kitchen.. 1 daily 7)  Protonix 40 Mg Tbec (Pantoprazole sodium) .... One daily as needed 8)  Align Caps (Probiotic product) .... Once  daily 9)  Crestor 20 Mg Tabs (Rosuvastatin calcium) .... One tablet daily 10)  Multivitamins Tabs (Multiple vitamin) .... Take 1 tablet by mouth once a day 11)  Nasonex 50 Mcg/act Susp (Mometasone furoate) .Marland Kitchen.. 1 spray each nostril at bedtime   lot # [id number] exp: feb 2013 as needed 12)  Advair Diskus 250-50 Mcg/dose Aepb (Fluticasone-salmeterol) .Marland Kitchen.. 1 puff two times a day 13)  Ventolin Hfa 108 (90 Base) Mcg/act Aers (Albuterol sulfate) .... Inhale 2 puffs every four hours as needed 14)  Albuterol Sulfate (2.5 Mg/79ml) 0.083% Nebu (Albuterol sulfate) .... Four times a day 15)  Prednisone 20 Mg Tabs (Prednisone) .... Take one tablet qd  Other Orders: Est. Patient Level II (60454)  Patient Instructions: 1)  Taper off prednisone as scheduled.  2)  Mucinex DM two times a day as needed cough/congestion  3)   Stay on advair two times a day --brush/rinse and gargle after use.  4)  Stop taking aspirin 5)  Nebuliser with albuterol three times a day as needed 6)  follow up 6 weeks Dr. Vassie Loll  7)  Please contact office for sooner follow up as needed

## 2010-07-06 NOTE — Progress Notes (Signed)
Summary: albuterol denied  Phone Note Other Incoming Call back at [phone]   Caller: [email or web address] Summary of Call: Albuterol nebulizer solution denied upon medicare part B. Initial call taken by: Darletta Moll,  March 16, 2010 10:00 AM  Follow-up for Phone Call        spoke to pt's pharmacy--[facility]--per [facility] this med should have been filed under pt's medicare part b not d--pharmacy states they will call pt and get her part b info and refile, told them to call us back if there was a problem once they refiled Follow-up by: Philipp Deputy CMA,  March 16, 2010 4:54 PM

## 2010-07-06 NOTE — Op Note (Signed)
Summary: [facility]  [facility]   Imported By: Marylou Mccoy 08/26/2009 12:44:37  _____________________________________________________________________  External Attachment:    Type:   Image     Comment:   External Document

## 2010-07-06 NOTE — Assessment & Plan Note (Signed)
Summary: NP follow up - reactive airway disease   Primary Provider/Referring Provider:  Dr. [facility]  CC:  1 day follow up - pt states her symtoms have improved since OV yesterday.  took the 60mg  prednisone this morning.[name] [name]  History of Present Illness: 10/29/09 - initial 75/F never smoker with CAD for evaluation of cough & wheezing x 3-4 months due to reactive airways. This was of insidious onset  - no clear URI symptoms preceding, seemed to get worse while working in her garden & at night - initially treated for bronchitis with neb inthe office & oral prednisone & antibiotic but cough returned as soon as she completed pred course. A cortisone shot inher left knee seems to have given her the greatest relief. She had an ER visit during a trip to [place]  - echoi nml LV fn, BNP 55, stress myoview nml, given cipro for 'bronchitis.' Review of CXR 08/10/09 shows bibasal interstitial prominence, lt volume loss with raised hemid iaphragm CT angio chest 8/09 was neg for PE, stable RUL 5mm nodule as compared to 05/2005, no obvious fibrosis. She reports good control of heartburn with protonix , had an EGD by Dr Loreta Ave in 11/10 PFTs in 10/10 were essentially nml, no obstruction, nml lung volumes & diffusion  November 23, 2009 3:44 PM  Cough/ wheezing much improved with oral prednisone - not taking qvar daily since better. She had a sleep study at [facility] neurology. She is needing cortisone shots for her knee every month. Spirometry shows nml lung function  January 04, 2010 11:12 AM  Exacerbation while on Toprol  required prednisone20 mg - did not tolerate nevibulol -rash. Developed bspasm again once prednisone tapered. Also c/o bruising over thighs & legs. Worried about her heart if toprol stopped. RAST neg  January 18, 2010 2:27 PM  ER visit on 01/09/10 - absolute eos count 800 ,BNP 69, CXR - cardiomegaly, was on prednisone . Diffuse wheezing & coughing today - ofered hospital admission but she  has grandkids over. Given depot shot January 19, 2010--Presents for follow up of RAD exacerbation. She was seen yesterday with RAD/asthmatic flare. Tx w/ depo medrol injection and steroids. She returns today much improved. with decreased cough and wheezing. She has not picked up rx at pharm but took 30mg  of prednisone this am. no discolored mucus or fever. Labs showed neg ANA, and low ESR. RA factor was elevated. Denies chest pain,  orthopnea, hemoptysis, fever, n/v/d, edema, headache.   Medications Prior to Update: 1)  Plavix 75 Mg Tabs (Clopidogrel Bisulfate) .... Take One Tablet By Mouth Daily 2)  Nitroglycerin 0.4 Mg Subl (Nitroglycerin) .[name] [name].. 1 Tablet Under Tongue As Directed 3)  Toprol Xl 50 Mg Xr24h-Tab (Metoprolol Succinate) .... One and One-Half Daily- On Hold 4)  Aspirin 81 Mg Tabs (Aspirin) .... Once Daily Hold 5)  Losartan Potassium 25 Mg Tabs (Losartan Potassium) .... Take 1 Tablet By Mouth Once Daily 6)  Coenzyme Q10 100 Mg Caps (Coenzyme Q10) .[name] [name].. 1 Daily 7)  Protonix 40 Mg Tbec (Pantoprazole Sodium) .... One Daily As Needed 8)  Align  Caps (Probiotic Product) .... Once Daily 9)  Crestor 20 Mg Tabs (Rosuvastatin Calcium) .... One Tablet Daily 10)  Multivitamins   Tabs (Multiple Vitamin) .... Take 1 Tablet By Mouth Once A Day 11)  Nasonex 50 Mcg/act Susp (Mometasone Furoate) .[name] [name].. 1 Spray Each Nostril At Bedtime   Lot # [id number] Exp: Feb 2013 As Needed 12)  Advair Diskus 250-50 Mcg/dose Aepb (Fluticasone-Salmeterol) .[name] KitchenMarland KitchenMarland [name]  1 Puff Two Times A Day 13)  Albuterol Inhaler .[name] [name].. 1 Puff As Needed 14)  Albuterol Sulfate (2.5 Mg/85ml) 0.083% Nebu (Albuterol Sulfate) .... Four Times A Day 15)  Prednisone 10 Mg Tabs (Prednisone) .... Taper  Current Medications (verified): 1)  Plavix 75 Mg Tabs (Clopidogrel Bisulfate) .... Take One Tablet By Mouth Daily 2)  Nitroglycerin 0.4 Mg Subl (Nitroglycerin) .[name] [name].. 1 Tablet Under Tongue As Directed 3)  Toprol Xl 50 Mg Xr24h-Tab (Metoprolol Succinate) .... One  and One-Half Daily- On Hold 4)  Aspirin 81 Mg Tabs (Aspirin) .... Once Daily Hold 5)  Losartan Potassium 25 Mg Tabs (Losartan Potassium) .... Take 1 Tablet By Mouth Once Daily 6)  Coenzyme Q10 100 Mg Caps (Coenzyme Q10) .[name] [name].. 1 Daily 7)  Protonix 40 Mg Tbec (Pantoprazole Sodium) .... One Daily As Needed 8)  Align  Caps (Probiotic Product) .... Once Daily 9)  Crestor 20 Mg Tabs (Rosuvastatin Calcium) .... One Tablet Daily 10)  Multivitamins   Tabs (Multiple Vitamin) .... Take 1 Tablet By Mouth Once A Day 11)  Nasonex 50 Mcg/act Susp (Mometasone Furoate) .[name] [name].. 1 Spray Each Nostril At Bedtime   Lot # [id number] Exp: Feb 2013 As Needed 12)  Advair Diskus 250-50 Mcg/dose Aepb (Fluticasone-Salmeterol) .[name] [name].. 1 Puff Two Times A Day 13)  Albuterol Inhaler .[name] [name].. 1 Puff As Needed 14)  Albuterol Sulfate (2.5 Mg/28ml) 0.083% Nebu (Albuterol Sulfate) .... Four Times A Day 15)  Prednisone 10 Mg Tabs (Prednisone) .... Taper  Allergies (verified): 1)  ! Pcn 2)  ! Biaxin 3)  * Contrast Dye  Past History:  Past Medical History: Last updated: 11/25/2009 1. Coronary artery disease.  Left heart catheterization was done in September 2009 because of exertional chest pain.  It showed an EF of 60% on left ventriculogram, RCA was totally occluded proximally with left-to-right collaterals, the distal left main had a 20% lesion, there was a small ramus with a 60-70% stenosis, and the mid LAD had  serial 60% stenoses with a distal subtotalled LAD.  There was a 50% first diagonal stenosis and a 50% first OM stenosis. There were poor distal targets for bypass surgery and no good interventional target so medical management was planned. Myoview in [place] in 3/11 was normal per report.  2. Hypertension. 3. Peptic ulcer disease. 4. Hysterectomy. 5. History of  HBV, HCV 6. History of chronic cough.  ACEI stopped.  Possible contribution from GERD. 7. History of PVCs and PACs.  8. GERD 9. IBS 10. Depression 11.  Hyperlipidemia 12. Obesity 13. h/o pancreatitis 14. Osteoporosis 15. Knee osteoarthritis 16. Dyspnea/cough: Echo (3/10): EF 55-60%, no regional wall motion abnormalities, aortic sclerosis.  Echo (3/11) in [place]: EF 60%, grade I diastolic dysfunction, normal RV, no regional wall motion abnormalities, valves are ok.  PFTs (10/10): FVC 103%, FEV1 109%, ratio 74%.  Mild obstruction.  CT chest (6/11) did not show evidence for interstitial lung disease.  Possible upper airways cough syndrome.    Past Surgical History: Last updated: 10/29/2009 Cholecystectomy Hysterectomy Pancreatitic Duct Stent Tubal Ligation-1995  Family History: Last updated: 10/29/2009 Family History Asthma-sister, mother Family History Emphysema-sister Family History Rheumatoid Arthritis-mother Family History Colon Cancer-maternal aunt Family History Lung Cancer-maternal aunt Pancreatic Cancer-brother Family History C V A / Stroke -mother  Social History: Last updated: 10/29/2009 The patient does not drink or smoke (never smoked).   Marital Status:Married lives with husband  Children: Yes Occupation: Retired [employer]  Patient never smoked. (2nd hand smoke exposure x 40 years)  Risk Factors: Smoking Status: never (01/18/2010)  Review of Systems      See HPI  Vital Signs:  Patient profile:   75 year old female Height:      62.5 inches Weight:      188.25 pounds BMI:     34.01 O2 Sat:      99 % on Room air Temp:     99.7 degrees F oral Pulse rate:   84 / minute BP sitting:   140 / 90  (left arm) Cuff size:   large  Vitals Entered By: Boone Master CNA/MA (January 19, 2010 3:41 PM)  O2 Flow:  Room air CC: 1 day follow up - pt states her symtoms have improved since OV yesterday.  took the 60mg  prednisone this morning. Is Patient Diabetic? No Comments Medications reviewed with patient Daytime contact number verified with patient. Boone Master CNA/MA  January 19, 2010 3:41 PM     Physical Exam  Additional Exam:  Gen. Pleasant, well-nourished, in no distress, normal affect ENT - no lesions, no post nasal drip Neck: No JVD, no thyromegaly, no carotid bruits Lungs: coarse BS w/ no wheezing or psuedowheezing  Cardiovascular: Rhythm regular, heart sounds  normal, no murmurs or gallops, no peripheral edema Musculoskeletal: No deformities, no cyanosis or clubbing      Impression & Recommendations:  Problem # 1:  REACTIVE AIRWAY DISEASE (ICD-493.90) Flare -improving. Significant improvement w/ steroids.  REC : Take Prednisone 60mg  once daily for 3 days , then 40mg  once daily for 3 days , 30mg  x3d, 20mg  x3d , 10mg  x 3d then stop.  Mucinex DM two times a day as needed cough/congestion   Stay on advair two times a day --brush/rinse and gargle after use.  Stop taking aspirin Nebuliser with albuterol three times a day as needed  follow up 2 weeks Dr. Vassie Loll  will discuss significance of RA factor on return .   Medications Added to Medication List This Visit: 1)  Prednisone 20 Mg Tabs (Prednisone) .... 3 tabs once daily x 3 d, 2 tab once daily x 3 days, 1 1/2 once daily x 3 days, 1 tab once daily x 3 days, then 1/2 tab once daily x 3 days, and stop  Complete Medication List: 1)  Plavix 75 Mg Tabs (Clopidogrel bisulfate) .... Take one tablet by mouth daily 2)  Nitroglycerin 0.4 Mg Subl (Nitroglycerin) .[name] [name].. 1 tablet under tongue as directed 3)  Toprol Xl 50 Mg Xr24h-tab (Metoprolol succinate) .... One and one-half daily- on hold 4)  Aspirin 81 Mg Tabs (Aspirin) .... Once daily hold 5)  Losartan Potassium 25 Mg Tabs (Losartan potassium) .... Take 1 tablet by mouth once daily 6)  Coenzyme Q10 100 Mg Caps (Coenzyme q10) .[name] [name].. 1 daily 7)  Protonix 40 Mg Tbec (Pantoprazole sodium) .... One daily as needed 8)  Align Caps (Probiotic product) .... Once daily 9)  Crestor 20 Mg Tabs (Rosuvastatin calcium) .... One tablet daily 10)  Multivitamins Tabs (Multiple vitamin) ....  Take 1 tablet by mouth once a day 11)  Nasonex 50 Mcg/act Susp (Mometasone furoate) .[name] [name].. 1 spray each nostril at bedtime   lot # [id number] exp: feb 2013 as needed 12)  Advair Diskus 250-50 Mcg/dose Aepb (Fluticasone-salmeterol) .[name] [name].. 1 puff two times a day 13)  Albuterol Inhaler  .[name] [name].. 1 puff as needed 14)  Albuterol Sulfate (2.5 Mg/55ml) 0.083% Nebu (Albuterol sulfate) .... Four times a day 15)  Prednisone 10 Mg Tabs (Prednisone) .... Taper 16)  Prednisone  20 Mg Tabs (Prednisone) .... 3 tabs once daily x 3 d, 2 tab once daily x 3 days, 1 1/2 once daily x 3 days, 1 tab once daily x 3 days, then 1/2 tab once daily x 3 days, and stop  Other Orders: Nebulizer Tx (16010) Est. Patient Level III (93235)  Patient Instructions: 1)  Take Prednisone 60mg  once daily for 3 days , then 40mg  once daily for 3 days , 30mg  x3d, 20mg  x3d , 10mg  x 3d then stop.  2)  Mucinex DM two times a day as needed cough/congestion  3)   Stay on advair two times a day --brush/rinse and gargle after use.  4)  Stop taking aspirin 5)  Nebuliser with albuterol three times a day as needed  6)  follow up 2 weeks Dr. Vassie Loll  Prescriptions: PREDNISONE 10 MG TABS (PREDNISONE) taper  #40 x 0   Entered by:   Kandice Hams CMA   Authorized by:   Rubye Oaks NP   Signed by:   Kandice Hams CMA on 01/19/2010   Method used:   Telephoned to ...       [facility]  [address] [id number] ([facility]ail)       [address]       [place], [place]  [address]       Ph: [phone] or [phone]       Fax: [phone]   RxID:   [id number] PREDNISONE 20 MG TABS (PREDNISONE) 3 tabs once daily x 3 d, 2 tab once daily x 3 days, 1 1/2 once daily x 3 days, 1 tab once daily x 3 days, then 1/2 tab once daily x 3 days, and stop  #40 x 0   Entered and Authorized by:   Rubye Oaks NP   Signed by:   Ved Martos NP on 01/19/2010   Method used:   Print then Give to Patient   RxID:   [id number]

## 2010-07-06 NOTE — Consult Note (Signed)
Summary: [facility]   Imported By: Lester [place] 03/01/2010 10:08:43  _____________________________________________________________________  External Attachment:    Type:   Image     Comment:   External Document

## 2010-07-06 NOTE — Assessment & Plan Note (Signed)
Summary: LEG PAIN/NWS    Vital Signs:  Patient Profile:   75 Years Old Female Weight:      186 pounds Temp:     97.2 degrees F oral Pulse rate:   76 / minute Resp:     18 per minute BP sitting:   135 / 94  (left arm)                 Chief Complaint:  leg pain.  History of Present Illness: Having pain around left knee for about 1 month Pain gets very severe at times Tries heat and celebrex  Started with neck pain going into left arm Got steroid shots in neck Then the pain started in left leg---below the knee  NO fall or injury No radiation of pain seems to be slightly swollen just in that area--points to tibial plateau about 6cm below patella No leg weakness  Current Allergies: ! PCN ! ASA ! BIAXIN  Past Medical History:    Reviewed history from 02/06/2007 and no changes required:       Anxiety       GERD       Hepatitis B, hx of       IBS       Hypertension       Depression       Hyperlipidemia       Obesity       Hepatitis C       H/O Pancreatitis       Osteoporosis  Past Surgical History:    Reviewed history from 02/06/2007 and no changes required:       Cholecystectomy       Hysterectomy       Pancreatitic Duct Stent      Physical Exam  General:     alert and normal appearance.   Msk:     knees both seem to have some effusion tenderness just medial to patellar ligament insertion Macmurrays recreates the pain    Impression & Recommendations:  Problem # 1:  KNEE PAIN (ZOX-096.04) Assessment: New on left Exam suggests meniscus injury  P:  use elastic brace again     continue celebrex      needs ortho eval Orders: Orthopedic Surgeon Referral (Ortho Surgeon)    Patient Instructions: 1)  Needs orthopedic evaluation for knee 2)  Please call Monday morning to get this set up    ]

## 2010-07-06 NOTE — Progress Notes (Signed)
Summary: albuterol neb  Phone Note From Pharmacy   [facility]  [address] [id number] Call For: [name] [name]  Summary of Call: [facility]. I see in pt chart that this has been taking care of an pt needs to get medication through [facility] for medicare part-B. I called the pt to advised and she states she is aware of this and she did not request [facility]ill [facility] because she has about 6 mth supply. I called [facility] and advised this refill was not needed and to stop it from being refaxed. They stated they will take care of this and we should not receive anymore faxes from them in regards to this medication.Carron Curie CMA  April 08, 2010 11:58 AM

## 2010-07-06 NOTE — Progress Notes (Signed)
Summary: Swelling in feet and left leg and B/P 197/91  Phone Note Call from Patient Call back at Home Phone [phone]   Caller: Patient Summary of Call: Swelling in left leg and feet and B/P is up 197/91 Initial call taken by: Judie Grieve,  January 29, 2010 8:51 AM  Follow-up for Phone Call        talked with by telephone--swelling in left foot and ankle starting yesterday--pt had cortisone shot in left knee 01/26/10--talked with pt again about her B/P    recheck B/P after meds 155/80--pt denies SOB /swelling in her right foot or leg -- will review with Dr Gae Gallop, RN, BSN  January 29, 2010 11:23 AM --reviewed with Dr Almon Hercules to come to office now to let Dr Shirlee Latch take a look at her leg Luana Shu

## 2010-07-06 NOTE — Letter (Signed)
Summary: [facility]   Imported By: Maryln Gottron 07/28/2008 15:48:32  _____________________________________________________________________  External Attachment:    Type:   Image     Comment:   External Document

## 2010-07-06 NOTE — Progress Notes (Signed)
Summary: refill meds  Phone Note Refill Request Call back at Home Phone [phone] Message from:  Patient on April 17, 2009 8:52 AM  Refills Requested: Medication #1:  TOPROL XL 50 MG XR24H-TAB Take 1 1/2 tablet by mouth once a day  Medication #2:  CRESTOR 10 MG TABS 1 daily [facility] [address]    Method Requested: Fax to Local Pharmacy Initial call taken by: Lorne Skeens,  April 17, 2009 8:54 AM    Prescriptions: TOPROL XL 50 MG XR24H-TAB (METOPROLOL SUCCINATE) Take 1 1/2 tablet by mouth once a day  #45 x 6   Entered by:   Judithe Modest CMA   Authorized by:   Marca Ancona, MD   Signed by:   Judithe Modest CMA on 04/17/2009   Method used:   [facility]  [address] [id number]       [address]       [place], [place]  [address]       Ph: [phone] or [phone]       Fax: [phone]   RxID:   [id number]

## 2010-07-06 NOTE — Progress Notes (Signed)
Summary: results  Phone Note Call from Patient Call back at [phone]   Caller: Patient Call For: Evelin Cake Reason for Call: Talk to Nurse, Lab or Test Results Summary of Call: would like test results. (CT) Initial call taken by: Eugene Gavia,  November 06, 2009 11:36 AM  Follow-up for Phone Call        pt advised per append of ct resutls. pt has f/u appt on june 20. Carron Curie CMA  November 06, 2009 11:41 AM

## 2010-07-06 NOTE — Miscellaneous (Signed)
Summary: Orders Update  Clinical Lists Changes  Orders: Added new Test order of Venous Duplex Lower Extremity (Venous Duplex Lower) - Signed 

## 2010-07-06 NOTE — Letter (Signed)
Summary: [facility]  [facility]   Imported By: Marylou Mccoy 03/16/2010 08:58:57  _____________________________________________________________________  External Attachment:    Type:   Image     Comment:   External Document

## 2010-07-06 NOTE — Consult Note (Signed)
Summary: [facility]  [facility]   Imported By: Lester Matador 02/25/2009 09:25:47  _____________________________________________________________________  External Attachment:    Type:   Image     Comment:   External Document

## 2010-07-06 NOTE — Progress Notes (Signed)
Summary: allergy test results-LMTCB x 1  Phone Note Call from Patient Call back at Home Phone [phone]   Caller: Patient Call For: [name] [name] Reason for Call: Talk to Nurse, Lab or Test Results Summary of Call: pt looking for results of allergy testing. Initial call taken by: Eugene Gavia,  December 14, 2009 9:29 AM  Follow-up for Phone Call        pt calling for lab results done 12-10-2009.  Labs unsigned.  Please advise.  Thanks.  Aundra Millet Reynolds LPN  December 14, 2009 10:23 AM    no indoor or outddor allergies detected on this blood test hope she is feeling better stay on qvar after prednisone is done. Follow-up by: Comer Locket [name] Loll MD,  December 14, 2009 12:32 PM  Additional Follow-up for Phone Call Additional follow up Details #1::        LMOMTCB Vernie Murders  December 14, 2009 1:48 PM     Additional Follow-up for Phone Call Additional follow up Details #2::    pt aware of dr alva's response Follow-up by: Philipp Deputy CMA,  December 14, 2009 4:28 PM

## 2010-07-06 NOTE — Consult Note (Signed)
Summary: [facility]  [facility]   Imported By: Marylou Mccoy 08/26/2009 12:37:50  _____________________________________________________________________  External Attachment:    Type:   Image     Comment:   External Document

## 2010-07-06 NOTE — Progress Notes (Signed)
  Faxed ROI over to [facility] to fax [phone]  General # [phone],Recieved Records back today took around to Hagerman L.... Cala Bradford Mesiemore  August 21, 2009 3:10 PM    Appended Document:  Had to fax ROI back over to the [employer]....for Thurston Hole L  Appended Document:  Recieved [employer] Pages to Ernestine Mcmurray

## 2010-07-06 NOTE — Progress Notes (Signed)
Summary: rechecking CK  Phone Note Outgoing Call   Call placed by: Katina Dung, RN, BSN,  February 23, 2010 1:38 PM Call placed to: Patient Summary of Call: report from Dr Dareen Piano  Follow-up for Phone Call        Dr Shirlee Latch reviewed report from Dr Dareen Piano from 02/17/10--notes indicate CK was 709--Dr Shirlee Latch recommended checking CK after pt has been off Crestor for 2 weeks--pt was going to D/C Crestor 02/18/10--LMTCB Katina Dung, RN, BSN  February 23, 2010 1:40 PM --discussed with pt-she stopped Crestor 02/18/10 --she will stay off Crestor--she will repeat CK on 03/04/10

## 2010-07-06 NOTE — Assessment & Plan Note (Signed)
Summary: [name] [name] [name] [name]   Primary Provider:  Jarome Matin  CC:  Patient reports a lot of fatigue.  She is not sure if it is from her extra weight or medications.  History of Present Illness: 75 yo with history of CAD (no good options for revascularization) but preserved LV systolic function presents for cardiology followup.  She is doing much better than at the last appointment.  Joint pains seem to have decreased significantly.  She is tolerating a low dose of Crestor.  No chest pain.  She has been working in her garden.  No shortness of breath with moderate exertion such as gardening or climbing a flight of stairs.  Her BP has been running high at home (170/90 last week) but she had been on prednisone for a bronchitis flare.  BP is 126/80 today.     Labs (3/10): LDL 64, HDL 40, creatinine 0.8  Current Medications (verified): 1)  Plavix 75 Mg Tabs (Clopidogrel Bisulfate) .... Take One Tablet By Mouth Daily 2)  Nitroglycerin 0.4 Mg Subl (Nitroglycerin) .Marland Kitchen.. 1 Tablet Under Tongue As Directed 3)  Toprol Xl 50 Mg Xr24h-Tab (Metoprolol Succinate) .... Take 1 1/2 Tablet By Mouth Once A Day 4)  Aspirin 81 Mg Tabs (Aspirin) .... Once Daily 5)  Crestor 5 Mg Tabs (Rosuvastatin Calcium) .... Take One Tablet Once Daily 6)  Losartan Potassium 25 Mg Tabs (Losartan Potassium) .Marland Kitchen.. 1 Daily  Allergies: 1)  ! Pcn 2)  ! Biaxin 3)  * Contrast Dye  Past History:  Past Medical History: 1. Coronary artery disease.  Left heart catheterization was done in September 2009 because of exertional chest pain.  It showed an EF of 60% on left ventriculogram, RCA was totally occluded proximally with left-to-right collaterals, the distal left main had a 20% lesion, there was a small ramus with a 60-70% stenosis, and the mid LAD had  serial 60% stenoses with a distal subtotalled LAD.  There was a 50% first diagonal stenosis and a 50% first OM stenosis. There were poor distal targets for bypass surgery and medical management  was planned. 2. Hypertension. 3. Peptic ulcer disease. 4. Hysterectomy. 5. History of  HBV, HCV 6. History of chronic cough.  ACEI stopped.  Possible contribution from GERD. 7. History of PVCs and PACs.  8. GERD 9. IBS 10. Depression 11. Hyperlipidemia 12. Obesity 13. h/o pancreatitis 14. Osteoporosis 15. Knee osteoarthritis 16. Echo (3/10): EF 55-60%, no regional wall motion abnormalities, aortic sclerosis. 17. PFTs (10/10): FVC 103%, FEV1 109%, ratio 74%.  Mild obstruction.   Family History: Reviewed history from 02/11/2009 and no changes required. Noncontributory.  Social History: Reviewed history from 02/13/2009 and no changes required. The patient does not drink or smoke (never smoked).  She lives with her husband.   Review of Systems       All systems reviewed and negative except as per HPI.   Vital Signs:  Patient profile:   75 year old female Height:      62 inches Weight:      190 pounds BMI:     34.88 Pulse rate:   70 / minute Pulse rhythm:   regular BP sitting:   126 / 80  (left arm) Cuff size:   large  Vitals Entered By: Judithe Modest CMA (March 30, 2009 9:26 AM)  Physical Exam  General:  Well developed, well nourished, in no acute distress. Neck:  Neck supple, no JVD. No masses, thyromegaly or abnormal cervical nodes. Lungs:  Slight end expiratory  wheezes bilaterally.  Heart:  Non-displaced PMI, chest non-tender; regular rate and rhythm, S1, S2 without rubs or gallops. 2/6 early systolic ejection murmur at RUSB.  Carotid upstroke normal, no bruit.  Pedals normal pulses. No edema, no varicosities. Abdomen:  Bowel sounds positive; abdomen soft and non-tender without masses, organomegaly, or hernias noted. No hepatosplenomegaly. Extremities:  No clubbing or cyanosis. Neurologic:  Alert and oriented x 3. Psych:  Normal affect.   Impression & Recommendations:  Problem # 1:  CAD, NATIVE VESSEL (ICD-414.01) Patient has extensive CAD but no good  options for revascularization.  Her EF is preserved.  She has not had any chest pain.  She will continue ASA, Toprol XL,  and Plavix.  She is tolerating Crestor.   Problem # 2:  HYPERLIPIDEMIA (ICD-272.4) Patient is tolerating Crestor at low dose with CoQ 10.  Need fasting lipids/LFTs.  Will titrate up Crestor as needed.   Problem # 3:  HYPERTENSION (ICD-401.9) BP running high at times at home but good today.  Given coronary disease, will start her back on an ARB (losartan 25 mg daily).  She will have chem 7 in [redacted] wks along with her lipids.  She will check her BP on her home cuff and record.  We will call in 2 wks to see what it is running.   Followup in 4 months.   Patient Instructions: 1)  Your physician has recommended you make the following change in your medication:    2)  Start Losartan 25mg   1 daily 3)  Your physician recommends that you return for a FASTING lipid profile/liver profile/BMP/BNP in 2 weeks 414.01 401.9 v58.69 4)  Take and record your blood pressure --I will call you in 2 weeks to get the readings [name] [name] [phone]   5)  Your physician recommends that you schedule a follow-up appointment in: 4 months with Dr. Marca Ancona  Prescriptions: LOSARTAN POTASSIUM 25 MG TABS (LOSARTAN POTASSIUM) 1 daily  #30 x 6   Entered by:   Katina Dung, RN, BSN   Authorized by:   Marca Ancona, MD   Signed by:   Katina Dung, RN, BSN on 03/30/2009   Method used:   [facility]  [address] [id number]       [address]       [place], [place]  [address]       Ph: [phone] or [phone]       Fax: [phone]   RxID:   [id number]

## 2010-07-06 NOTE — Assessment & Plan Note (Signed)
Summary: 6 month rov/sl   Primary Provider:  Jarome Matin  CC:  6 month rov and pt is having a lot of coughing and wheezing.  She was put on an antibiotic that she had a bad reaction to.Marland Kitchen  History of Present Illness: 75 yo with history of CAD (no good options for revascularization) but preserved LV systolic function presents for cardiology followup.  Since I last saw Ms Hankey, she has been having trouble with cough and joint pains. She has pain in her shoulders, neck, arms, knees, and wrists.  Lipitor was stopped and she says she was also started and stopped on another cholesterol medication (? pravastatin).  Despite being off statins, her pains have not resolved.  She is actually going to see a rheumatologist next week.  She also developed worsening of her chronic cough.  Her ACEI was stopped without much effect.  She was apparently on an ARB for a while but that was stopped also.  About 3 months ago she started wheezing.  She was diagnosed with PNA by CXR but antibiotics did not help her symptoms and she says she had a bad reaction to antibiotics.  She has tried inhalers for the wheezing without much effect.  She stopped taking her aspirin.  She is just taking Plavix and Toprol.  She has not had any chest pain.    ECG:  NSR, old inferior MI  Labs (3/10): LDL 64, HDL 40, creatinine 0.8  Current Medications (verified): 1)  Plavix 75 Mg Tabs (Clopidogrel Bisulfate) .... Take One Tablet By Mouth Daily 2)  Nitroglycerin 0.4 Mg Subl (Nitroglycerin) .Marland Kitchen.. 1 Tablet Under Tongue As Directed 3)  Toprol Xl 50 Mg Xr24h-Tab (Metoprolol Succinate) .... Take 1 1/2 Tablet By Mouth Once A Day 4)  Methocarbamol 500 Mg Tabs (Methocarbamol) .... Two Times A Day 5)  Hydrocodone-Acetaminophen 5-325 Mg Tabs (Hydrocodone-Acetaminophen) .Marland Kitchen.. 1 To 2 Tablets Every 4 To 6 Hours For Pain 6)  Aspirin 81 Mg Tabs (Aspirin) .... Patient Not Taking, But She Does Have It  Allergies (verified): 1)  ! Pcn 2)  ! Asa 3)   ! Biaxin 4)  * Contrast Dye  Past History:  Past Medical History: 1. Coronary artery disease.  Left heart catheterization was done in     September 2009 because of exertional chest pain.  It showed an EF     of 60% on left ventriculogram, RCA was totally occluded proximally     with left-to-right collaterals, the distal left main had a 20%     lesion, there was a small ramus with a 60-70% stenosis, and the mid     LAD had  serial 60% stenoses with a distal subtotalled LAD.  There     was a 50% first diagonal stenosis and a 50% first OM stenosis.     There were poor distal targets for bypass surgery and medical     management was planned. 2. Hypertension. 3. Peptic ulcer disease. 4. Hysterectomy. 5. History of  HBV, HCV 6. History of chronic cough.  ACEI stopped.  Possible contribution from GERD. 7. History of PVCs and PACs.  8. GERD 9. IBS 10. Depression 11. Hyperlipidemia 12. Obesity 13. h/o pancreatitis 14. Osteoporosis 15. Knee osteoarthritis 16. Echo (3/10): EF 55-60%, no regional wall motion abnormalities, aortic sclerosis.  Social History: The patient does not drink or smoke (never smoked).  She lives with her husband.   Review of Systems       All systems reviewed and  negative except as per HPI.   Vital Signs:  Patient profile:   75 year old female Height:      62 inches Weight:      188 pounds BMI:     34.51 Pulse rate:   69 / minute Pulse rhythm:   regular BP sitting:   140 / 90  (right arm) Cuff size:   large  Vitals Entered By: Judithe Modest CMA (February 13, 2009 12:04 PM)  Physical Exam  General:  Well developed, well nourished, in no acute distress. Neck:  Neck supple, no JVD. No masses, thyromegaly or abnormal cervical nodes. Lungs:  Slight end expiratory wheezes bilaterally.  Heart:  Non-displaced PMI, chest non-tender; regular rate and rhythm, S1, S2 without murmurs, rubs or gallops. Carotid upstroke normal, no bruit.  Pedals normal pulses.  No edema, no varicosities. Abdomen:  Bowel sounds positive; abdomen soft and non-tender without masses, organomegaly, or hernias noted. No hepatosplenomegaly. Extremities:  No clubbing or cyanosis. Neurologic:  Alert and oriented x 3. Psych:  Normal affect.   Impression & Recommendations:  Problem # 1:  CAD, NATIVE VESSEL (ICD-414.01) Patient has extensive CAD but no good options for revascularization.  Her EF is preserved.  She has not had any chest pain.  She is off ASA, ACEI, and statin now and is just taking Plavix and Toprol.  I think given the extent of her disease that she needs to be on a statin.  It is not clear to me that the statins were the cause of her joint pains as she is still having them despite being off statins altogether for a while now.  I am going to have her start on Crestor 5 mg every other day.  If she tolerates this, she should increase to 5 mg daily after 2 wks.  She will also take Coenzyme Q10.  I would also like her to restart ASA 81 mg daily.  She has a vague history of some sort of bad reaction to ASA but had been tolerating it without any problems.    Problem # 2:  HYPERTENSION (ICD-401.9) Patient is on Toprol XL.  BP is borderline today.  I would like her to start back on an ARB, but as she has had a lot of issues with medications recently, the only changes that I will make today are starting Crestor and ASA.  I will probably restart and ARB when I see her back.   Problem # 3:  COUGH (ICD-786.2) Chronic cough and wheezing.  She has been off ACEI for a while now.  She is using inhalers but they don't seem to help much.  She has never smoked.  ? asthma.  Given the chronicity of these symptoms, I will set her up for PFTs.  She may benefit from evaluation by a pulmonologist.    I will see Ms Clowers back in a month.   Other Orders: Pulmonary Referral (Pulmonary)  Patient Instructions: 1)  Your physician recommends that you schedule a follow-up appointment in: 1  month 2)  Your physician has recommended you make the following change in your medication: Start Crestor 5 mg every other day for 2 weeks and then take every day. 3)  Start Coenzyme Q10 100 mg daily 4)  Your physician has recommended that you have a pulmonary function test.  Pulmonary Function Tests are a group of tests that measure how well air moves in and out of your lungs. Prescriptions: CRESTOR 5 MG TABS (ROSUVASTATIN CALCIUM) Take  one tablet by mouth every other day for 2 weeks and they every day  #30 x 6   Entered by:   Dossie Arbour, RN, BSN   Authorized by:   Marca Ancona, MD   Signed by:   Dossie Arbour, RN, BSN on 02/13/2009   Method used:   [facility]  [address] [id number]       [address]       [place], [place]  [address]       Ph: [phone] or [phone]       Fax: [phone]   RxID:   [id number]

## 2010-07-06 NOTE — Miscellaneous (Signed)
Summary: med change  Clinical Lists Changes  Medications: Changed medication from LOSARTAN POTASSIUM 25 MG TABS (LOSARTAN POTASSIUM) Take 1 tablet by mouth two times a day to LOSARTAN POTASSIUM 25 MG TABS (LOSARTAN POTASSIUM) Take 1 tablet by mouth once daily Changed medication from TOPROL XL 50 MG XR24H-TAB (METOPROLOL SUCCINATE) one and one-half daily to TOPROL XL 50 MG XR24H-TAB (METOPROLOL SUCCINATE) one and one-half daily- on HOLD

## 2010-07-06 NOTE — Letter (Signed)
Summary: [facility]  [facility]   Imported By: Roderic Ovens 03/30/2009 12:10:58  _____________________________________________________________________  External Attachment:    Type:   Image     Comment:   External Document  Appended Document: [facility] She can stop Plavix 5 days before procedure but would continue ASA 81 mg daily.   Appended Document: [facility] faxed to Dr Loreta Ave

## 2010-07-06 NOTE — Assessment & Plan Note (Signed)
Summary: rov   Primary Provider:  Dr. [facility]  CC:  chest pain.  History of Present Illness: pt walked into office --stated she had eaten a banana before she went to bed last night and woke up with a sharp shortlasting chest pain this morning--no other symtoms,including nausea, diaphoresis, or shortness of breath-she took 1 NTG with relief-she feels fine now--EKG and O2 Sat done and reviewed with Dr Debbora Dus review with Dr Ladona Ridgel  pt advised to   schedule followup with Dr Emily Filbert NTG prn for chest pain, present to ER if chest pain unrelieved with NTG, pt advised not to drive if she has chest pain--pt did not take any medications this morning except prednisone    Current Medications (verified): 1)  Plavix 75 Mg Tabs (Clopidogrel Bisulfate) .... Take One Tablet By Mouth Daily 2)  Nitroglycerin 0.4 Mg Subl (Nitroglycerin) .Marland Kitchen.. 1 Tablet Under Tongue As Directed 3)  Toprol Xl 50 Mg Xr24h-Tab (Metoprolol Succinate) .... Take 1 1/2 Tablet By Mouth Once A Day 4)  Aspirin 81 Mg Tabs (Aspirin) .... Once Daily 5)  Losartan Potassium 25 Mg Tabs (Losartan Potassium) .... One Tablet Daily 6)  Coenzyme Q10 100 Mg Caps (Coenzyme Q10) .Marland Kitchen.. 1 Daily 7)  Protonix 40 Mg Tbec (Pantoprazole Sodium) .... One Daily 8)  Align  Caps (Probiotic Product) .... Once Daily 9)  Crestor 10 Mg Tabs (Rosuvastatin Calcium) .... One Tablet Daily 10)  Multivitamins   Tabs (Multiple Vitamin) .... Take 1 Tablet By Mouth Once A Day 11)  Prednisone 10 Mg Tabs (Prednisone) .... 2 Tabs Once Daily With Food X 1 Wk, Then 1 Tab Once Daily X 1 Wk 12)  Nasonex 50 Mcg/act Susp (Mometasone Furoate) .Marland Kitchen.. 1 Spray Each Nostril At Bedtime   Lot # [id number] Exp: Feb 2013  Allergies: 1)  ! Pcn 2)  ! Biaxin 3)  * Contrast Dye  Vital Signs:  Patient profile:   75 year old female Height:      62 inches Weight:      188.50 pounds O2 Sat:      98 % Pulse rate:   54 / minute BP sitting:   154 / 90  (right arm)  Vitals  Entered By: Katina Dung, RN, BSN (Nov 03, 2009 11:13 AM)   Other Orders: EKG w/ Interpretation (93000) Prescriptions: IMDUR 30 MG XR24H-TAB (ISOSORBIDE MONONITRATE) one tablet daily  #30 x 6   Entered by:   Katina Dung, RN, BSN   Authorized by:   Marca Ancona, MD   Signed by:   Katina Dung, RN, BSN on 11/03/2009   Method used:   [facility]  [address] [id number]       [address]       [place], [place]  [address]       Ph: [phone] or [phone]       Fax: [phone]   RxID:   [id number]    Vital Signs:  Patient profile:   75 year old female Height:      62 inches Weight:      188.50 pounds O2 Sat:      98 % Pulse rate:   54 / minute BP sitting:   154 / 90  (right arm)  Vitals Entered By: Katina Dung, RN, BSN (Nov 03, 2009 11:13 AM)   reviewed with Dr Darvin Neighbours recommended starting Imdur 30mg  daily-I discussed with pt and  she agreed will forward to Dr Shirlee Latch for review and signature

## 2010-07-06 NOTE — Letter (Signed)
Summary: [facility] Surgical Clearance   [facility] Surgical Clear[facility]   Imported By: Roderic Ovens 03/24/2010 14:19:22  _____________________________________________________________________  External Attachment:    Type:   Image     Comment:   External Document

## 2010-07-06 NOTE — Medication Information (Signed)
Summary: [facility]  [facility]   Imported By: Sherian Rein 02/22/2010 14:01:16  _____________________________________________________________________  External Attachment:    Type:   Image     Comment:   External Document

## 2010-07-06 NOTE — Progress Notes (Signed)
Summary: PA for albuterol neb - pt needs to get from in network pharm  Phone Note Outgoing Call   Call placed by: Vernie Murders,  March 05, 2010 3:24 PM Call placed to: Insurer Summary of Call: Recieved PA for al[facility]erol from [facility] [address]  Called [employer] to initiate PA- awaiting form to be faxed to triage faxline. Initial call taken by: Vernie Murders,  March 05, 2010 3:29 PM  Follow-up for Phone Call        Recieved form and placed in RA's lookat for completion. Follow-up by: Vernie Murders,  March 05, 2010 4:42 PM  Additional Follow-up for Phone Call Additional follow up Details #1::        Per phone note from 10.11.11, [employer] called stating this was denied and needed to be submited under medicare part b.  [facility] and informed [facility] this and would call back if they had a problem.  [facility]ed [facility] [address] to follow up on this, spoke with [facility].  Per [place], they filed this under part b but still receiving message they need a prior auth.    Call [employer], spoke with kelly d.  Was told the reason [facility] is getting this message on the albuterol neb is bc [facility] is not an in network vendor.  Pt will need to get this medication from either [facility], [facility], [employer], [employer], or [employer] of it being a Part B medication.  [facility] will deliver med to pt and if pt wants to get set up thru them she can call [employer] to do this.    Called, spoke with pt.  She was informed of above and verbalized understanding. Gweneth Dimitri RN  April 01, 2010 4:18 PM

## 2010-07-06 NOTE — Progress Notes (Signed)
Summary: returning call back  Phone Note Call from Patient Call back at Home Phone [phone] Call back at [phone]   Caller: Patient Reason for Call: Talk to Nurse Details for Reason: call back to speak with anne. Initial call taken by: Lorne Skeens,  June 17, 2009 11:32 AM  Follow-up for Phone Call        [facility] Scherrie Bateman, LPN  June 17, 2009 11:51 AM  SPOKE WITH PT HAD LABS [facility] FOR 06/16/09 AND OFFICE WAS CLOSED D/T ROAD CONDITIONS. WILL CALL BACK TO RESCHEDULE ONCE RETURNS HOME,AT THIS TIME  IS HELPING DAUGHTER . attempted to reach pt to reschedule labs--NA VM Katina Dung, RN, BSN  June 18, 2009 5:29 PM   Follow-up by: Scherrie Bateman, LPN,  June 17, 2009 12:12 PM  Additional Follow-up for Phone Call Additional follow up Details #1::        talked with patient ---rescheduled lab to 06-30-09

## 2010-07-06 NOTE — Progress Notes (Signed)
Summary: Pt request call  Phone Note Call from Patient Call back at Home Phone [phone]   Caller: Patient Summary of Call: Pt request call Initial call taken by: Judie Grieve,  December 17, 2009 2:10 PM  Follow-up for Phone Call        talked with pt by telephone--she stated she started Nebivolol on Tuesday and this morning she has a rash on her legs  and itching--reviewed with Dr Almon Hercules will stop Nebivolol and restart Toprol --pt told she could use Benadryl but do not use Zyrtec at the same time--    New/Updated Medications: TOPROL XL 50 MG XR24H-TAB (METOPROLOL SUCCINATE) one and one-half daily   Current Medications (verified): 1)  Plavix 75 Mg Tabs (Clopidogrel Bisulfate) .... Take One Tablet By Mouth Daily 2)  Nitroglycerin 0.4 Mg Subl (Nitroglycerin) .Marland Kitchen.. 1 Tablet Under Tongue As Directed 3)  Toprol Xl 50 Mg Xr24h-Tab (Metoprolol Succinate) .... One and One-Half Daily 4)  Aspirin 81 Mg Tabs (Aspirin) .... Once Daily 5)  Losartan Potassium 25 Mg Tabs (Losartan Potassium) .... Take 1 Tablet By Mouth Two Times A Day 6)  Coenzyme Q10 100 Mg Caps (Coenzyme Q10) .Marland Kitchen.. 1 Daily 7)  Protonix 40 Mg Tbec (Pantoprazole Sodium) .... One Daily 8)  Align  Caps (Probiotic Product) .... Once Daily 9)  Crestor 20 Mg Tabs (Rosuvastatin Calcium) .... One Tablet Daily 10)  Multivitamins   Tabs (Multiple Vitamin) .... Take 1 Tablet By Mouth Once A Day 11)  Nasonex 50 Mcg/act Susp (Mometasone Furoate) .Marland Kitchen.. 1 Spray Each Nostril At Bedtime   Lot # [id number] Exp: Feb 2013 12)  Mucinex Dm 30-600 Mg Xr12h-Tab (Dextromethorphan-Guaifenesin) .... Take 1 Tablet By Mouth Once A Day 13)  Prednisone 10 Mg Tabs (Prednisone) .... Take  2 Tabs Daily X 7 Days, Then 1 Tab Daily X7 Days Then Stop. #30  Allergies: 1)  ! Pcn 2)  ! Biaxin 3)  * Contrast Dye

## 2010-07-06 NOTE — Progress Notes (Signed)
Summary: **LM** TEST RESULTS  Phone Note Call from Patient Call back at [phone]   Caller: Patient Summary of Call: TEST RESULTS Initial call taken by: Judie Grieve,  August 13, 2009 11:12 AM  Follow-up for Phone Call        [facility] for call back. Follow-up by: Suzan Garibaldi RN  Additional Follow-up for Phone Call Additional follow up Details #1::        Pt is still out of town.  She stated she started having edema in both hands and feet once she got to [place].  She also stated the SOB was pretty bad last night.  She didn't sound very SOB during the phone call. Told her the results of CXR, and told her Dr. Shirlee Latch wouldn't be back in the office until Tuesday 3/15. Advised her to seek medical attention in [place] if her symptoms get worse.  She verbalized understanding. Her cell phone number is  [phone] and is now working. Additional Follow-up by: Minerva Areola, RN, BSN,  August 14, 2009 10:07 AM     Appended Document: **LM** TEST RESULTS Swelling may have been related to the plane flight.  Would have her followup in the office when she gets home.   Appended Document: **LM** TEST RESULTS talked with pt --hospitalized in [place]--D/C'd 08-16-09--returning to [facility] 08-20-09--appt with Dr Shirlee Latch 08-21-09 8:45

## 2010-07-06 NOTE — Assessment & Plan Note (Signed)
Summary: rov ///kp   Visit Type:  Follow-up Primary Provider/Referring Provider:  Dr. [facility]  CC:  3 month follow up.  Pt states breathing is doing "great."  Denies chest tightness and cough.  .  History of Present Illness: 74/F never smoker with CAD for evaluation of reactive airway disease - trigger ? beta blockers ? GERD. 10/29/09 - initial This was of insidious onset  - no clear URI symptoms preceding, seemed to get worse while working in her garden & at night - She had an ER visit during a trip to [place]  - echoi nml LV fn, BNP 55, stress myoview nml, given cipro for 'bronchitis.' Review of CXR 08/10/09 shows bibasal interstitial prominence, lt volume loss with raised hemid iaphragm CT angio chest 8/09 was neg for PE, stable RUL 5mm nodule as compared to 05/2005, no obvious fibrosis. CT sinuses 8/11 nml She reports good control of heartburn with protonix , had an EGD by Dr Loreta Ave in 11/10 PFTs in 10/10 were essentially nml, no obstruction, nml lung volumes & diffusion. She had a sleep study at [facility] neurology. Spirometry6/11  shows nml lung function, RAST neg,  Labs showed neg ANA, and low ESR. RA factor was elevated.   January 18, 2010 2:27 PM  ER visit on 01/09/10 - absolute eos count 800 ,BNP 69, CXR - cardiomegaly, was on prednisone . Diffuse wheezing & coughing today - offered hospital admission but she has grandkids over. Given depot shot  March 01, 2010 10:20 AM  Reviewed Dr Ewell Poe notes - favors OA rather than RA, CCP neg, CK was high 709 - muscle cramps, crestor stopped, rpt CK planned for 9/29. Discussed side efects of steroids with her but explained that it is needed for bspasm. Stopped advair x 2 weeks - 'ran out', beta blockers stopped Underwent EMG,   May 14, 2010 4:56 PM  taking advair only once daily , but breathing better no skin rash She is needing cortisone shots for her knee every month, lt TKR planned  Preventive  Screening-Counseling & Management  Alcohol-Tobacco     Smoking Status: never  Current Medications (verified): 1)  Plavix 75 Mg Tabs (Clopidogrel Bisulfate) .... Take One Tablet By Mouth Daily 2)  Nitroglycerin 0.4 Mg Subl (Nitroglycerin) .Marland Kitchen.. 1 Tablet Under Tongue As Directed 3)  Losartan Potassium 50 Mg Tabs (Losartan Potassium) .... One Daily 4)  Protonix 40 Mg Tbec (Pantoprazole Sodium) .... Take 1 Tablet By Mouth Once A Day 5)  Ventolin Hfa 108 (90 Base) Mcg/act Aers (Albuterol Sulfate) .... Inhale 2 Puffs Every Four Hours As Needed 6)  Albuterol Sulfate (2.5 Mg/81ml) 0.083% Nebu (Albuterol Sulfate) .... Four Times A Day As Needed  Allergies (verified): 1)  ! Pcn 2)  ! Biaxin 3)  * Contrast Dye  Past History:  Past Medical History: Last updated: 01/29/2010 1. Coronary artery disease.  Left heart catheterization was done in September 2009 because of exertional chest pain.  It showed an EF of 60% on left ventriculogram, RCA was totally occluded proximally with left-to-right collaterals, the distal left main had a 20% lesion, there was a small ramus with a 60-70% stenosis, and the mid LAD had  serial 60% stenoses with a distal subtotalled LAD.  There was a 50% first diagonal stenosis and a 50% first OM stenosis. There were poor distal targets for bypass surgery and no good interventional target so medical management was planned. Myoview in [place] in 3/11 was normal per report.  2. Hypertension. 3.  Peptic ulcer disease. 4. Hysterectomy. 5. History of  HBV, HCV 6. History of chronic cough.  ACEI stopped.  Possible contribution from GERD. 7. History of PVCs and PACs.  8. GERD 9. IBS 10. Depression 11. Hyperlipidemia 12. Obesity 13. h/o pancreatitis 14. Osteoporosis 15. Knee osteoarthritis 16. Dyspnea/cough: Echo (3/10): EF 55-60%, no regional wall motion abnormalities, aortic sclerosis.  Echo (3/11) in [place]: EF 60%, grade I diastolic dysfunction, normal RV, no regional  wall motion abnormalities, valves are ok.  PFTs (10/10): FVC 103%, FEV1 109%, ratio 74%.  Mild obstruction.  CT chest (6/11) did not show evidence for interstitial lung disease.  Possible asthma or upper airways cough syndrome.  Aspirin and beta blocker have been held as potential causes for bronchospasm.    Social History: Last updated: 10/29/2009 The patient does not drink or smoke (never smoked).   Marital Status:Married lives with husband  Children: Yes Occupation: Retired [employer]  Patient never smoked. (2nd hand smoke exposure x 40 years)  Review of Systems  The patient denies anorexia, fever, weight loss, weight gain, vision loss, decreased hearing, hoarseness, chest pain, syncope, dyspnea on exertion, peripheral edema, prolonged cough, headaches, hemoptysis, abdominal pain, melena, hematochezia, severe indigestion/heartburn, hematuria, muscle weakness, suspicious skin lesions, transient blindness, difficulty walking, depression, unusual weight change, abnormal bleeding, enlarged lymph nodes, and angioedema.    Vital Signs:  Patient profile:   75 year old female Height:      61.5 inches Weight:      186.50 pounds BMI:     34.79 O2 Sat:      98 % on Room air Temp:     98.1 degrees F oral Pulse rate:   88 / minute BP sitting:   126 / 82  (right arm) Cuff size:   large  Vitals Entered By: Zackery Barefoot CMA (May 14, 2010 4:27 PM)  O2 Flow:  Room air CC: 3 month follow up.  Pt states breathing is doing "great."  Denies chest tightness and cough.   Comments Medications reviewed with patient Verified contact number and pharmacy with patient Zackery Barefoot [facility]  May 14, 2010 4:28 PM    Physical Exam  Additional Exam:  wt 188 March 02, 2010  Gen. Pleasant, well-nourished, in no distress, normal affect ENT - no lesions, no post nasal drip Neck: No JVD, no thyromegaly, no carotid bruits Lungs: CTA bialterally w/ no wheezing.  Cardiovascular:  Rhythm regular, heart sounds  normal, no murmurs or gallops, no peripheral edema Musculoskeletal: No deformities, no cyanosis or clubbing      Impression & Recommendations:  Problem # 1:  REACTIVE AIRWAY DISEASE (ICD-493.90) Clarified advair- is controller med to be taken bid   - regardless of symptoms Ventolin only for rescue as needed  stay on protonix for GERD suppression. Off beta blockade now OK to proceed with TKR  Other Orders: Est. Patient Level III (16109)  Patient Instructions: 1)  Copy sent to: Dr Ranell Patrick , Dr Jarold Motto 2)  Please schedule a follow-up appointment in 3  months. 3)  Stay on advair two times a day including day of surgery

## 2010-07-06 NOTE — Progress Notes (Signed)
Summary: B/P readings   Phone Note Outgoing Call   Call placed by: Katina Dung, RN, BSN,  April 29, 2009 8:28 AM Call placed to: Patient Summary of Call: B/P readings  Follow-up for Phone Call        talked with patient about recent B/P readings pt unable to locate recent B/P log--states B/P  averaging recently- 104-107/67-75  pulse averaging around 60--she got one pulse reading of 97--pt will come for BMP on 05/01/09--will forward to Dr Shirlee Latch for review       Appended Document: B/P readings  good BP  Appended Document: B/P readings  discussed with patient

## 2010-07-06 NOTE — Progress Notes (Signed)
Summary: call about recent medication changes by primary md-lm .dm   Medications Added CARDIZEM CD 180 MG XR24H-CAP (DILTIAZEM HCL COATED BEADS) Take 1 capsule by mouth once a day CARTIA XT 180 MG XR24H-CAP (DILTIAZEM HCL COATED BEADS)  DETROL LA 4 MG XR24H-CAP (TOLTERODINE TARTRATE) Apply 1 capsule by mouth once a day FOSAMAX 70 MG TABS (ALENDRONATE SODIUM) 1 tablet by mouth once a week HYDROCHLOROTHIAZIDE 25 MG TABS (HYDROCHLOROTHIAZIDE) Take 1 tablet by mouth once a day LISINOPRIL 20 MG TABS (LISINOPRIL) Take 1 tablet by mouth once a day NITROGLYCERIN 0.4 MG SUBL (NITROGLYCERIN) 1 tablet under tongue as directed PREDNISONE 20 MG TABS (PREDNISONE) Take 3 tablet by mouth as directed TOPROL XL 50 MG XR24H-TAB (METOPROLOL SUCCINATE) Take 1 1/2 tablet by mouth once a day TRAMADOL HCL 50 MG TABS (TRAMADOL HCL) Take 1 tablet by mouth twice a day CARDIZEM CD 180 MG XR24H-CAP (DILTIAZEM HCL COATED BEADS) Take 1 capsule by mouth once a day CARTIA XT 180 MG XR24H-CAP (DILTIAZEM HCL COATED BEADS)  DETROL LA 4 MG XR24H-CAP (TOLTERODINE TARTRATE) Apply 1 capsule by mouth once a day       Phone Note Call from Patient Call back at Home Phone [phone]   Caller: Patient Reason for Call: Talk to Nurse Summary of Call: Recently saw primary MD.  Experiencing a lot of joint pain..primary changed some of her meds.  Please call to verify the changes are ok. Initial call taken by: Burnard Leigh,  September 24, 2008 12:59 PM  Follow-up for Phone Call        recent physical with PCP-Dr Eloise Harman he wants he wants her to D/C Llipitor(muscle and joint pain) and try Pravastatin and stop Lisinopril(dry cough) and try MIcardis -will review with Dr. Marca Ancona today and follow-up with patient [name] Dung, RN, BSN  September 24, 2008 3:01 PM  discussed with Dr. Marca Ancona -would prefer patient remain on current Rx butif pt insists OK to change RX- pt emphatic about muscle and joint pain starting when she started  Lipitor and cough starting in Dec and not recent allergy cough-pt will make changes recommended by Dr Eloise Harman and see if she gets symptom relief-  Follow-up by: [name] Dung, RN, BSN,  September 24, 2008 3:35 PM   New Allergies: * CONTRAST DYE New/Updated Medications: CARDIZEM CD 180 MG XR24H-CAP (DILTIAZEM HCL COATED BEADS) Take 1 capsule by mouth once a day CARTIA XT 180 MG XR24H-CAP (DILTIAZEM HCL COATED BEADS)  DETROL LA 4 MG XR24H-CAP (TOLTERODINE TARTRATE) Apply 1 capsule by mouth once a day FOSAMAX 70 MG TABS (ALENDRONATE SODIUM) 1 tablet by mouth once a week HYDROCHLOROTHIAZIDE 25 MG TABS (HYDROCHLOROTHIAZIDE) Take 1 tablet by mouth once a day LISINOPRIL 20 MG TABS (LISINOPRIL) Take 1 tablet by mouth once a day NITROGLYCERIN 0.4 MG SUBL (NITROGLYCERIN) 1 tablet under tongue as directed PREDNISONE 20 MG TABS (PREDNISONE) Take 3 tablet by mouth as directed TOPROL XL 50 MG XR24H-TAB (METOPROLOL SUCCINATE) Take 1 1/2 tablet by mouth once a day TRAMADOL HCL 50 MG TABS (TRAMADOL HCL) Take 1 tablet by mouth twice a day CARDIZEM CD 180 MG XR24H-CAP (DILTIAZEM HCL COATED BEADS) Take 1 capsule by mouth once a day CARTIA XT 180 MG XR24H-CAP (DILTIAZEM HCL COATED BEADS)  DETROL LA 4 MG XR24H-CAP (TOLTERODINE TARTRATE) Apply 1 capsule by mouth once a day New Allergies: * CONTRAST DYE

## 2010-07-06 NOTE — Consult Note (Signed)
Summary: [facility]  [facility]   Imported By: Esmeralda Links D'jimraou 03/19/2008 15:06:18  _____________________________________________________________________  External Attachment:    Type:   Image     Comment:   External Document

## 2010-07-06 NOTE — Progress Notes (Signed)
Summary: PT HAVE QUESTION ABOUT SOME MEDS  Phone Note Call from Patient Call back at Home Phone [phone]   Caller: Patient Summary of Call: PT CALLING WITH QUESTION ABOUT SOME MEDICATION Initial call taken by: Judie Grieve,  February 23, 2009 10:02 AM  Follow-up for Phone Call        talked with  patient- Vit D2 50000 International Units 1 weekly-- recommeded by Dr Orlin Hilding for low Vit D level -feels really good right now --will review with Dr Shirlee Latch and follow-up with patient Katina Dung, RN, BSN  February 23, 2009 10:32 AM  per Dr Wilmon Pali to take Vit D- should monitor calcium level LMVM for pt Katina Dung, RN, BSN  February 23, 2009 3:30 PM  talked with patient--pt has decided not to take Vit D at this time and she will let Dr Lendon Colonel know her decision

## 2010-07-06 NOTE — Procedures (Signed)
Summary: EGD/[facility]  EGD/[facility]   Imported By: Sherian Rein 05/12/2009 07:26:50  _____________________________________________________________________  External Attachment:    Type:   Image     Comment:   External Document

## 2010-07-06 NOTE — Progress Notes (Signed)
Summary: nos appt  Phone Note Call from Patient   Caller: [email or web address]  Call For: Nilam Quakenbush Summary of Call: In ref to nos from 11/28, pt rsc with Dr. Vassie Loll on 12/9. Initial call taken by: Darletta Moll,  May 04, 2010 9:19 AM

## 2010-07-06 NOTE — Assessment & Plan Note (Signed)
Summary: [name] [name]   Primary Provider:  Jarome [name]  CC:  [name] [name]/pt recently in hospital in [place] for CHF.  Pt states she feels "whoozy" often.  Pt states she is tired as well with very little activity.  History of Present Illness: 75 yo with history of CAD (no good options for revascularization) but preserved LV systolic function presents for cardiology followup.  She recently returned from a trip to [place] for a relative's funeral.  She had some mildly increase dyspnea prior to leaving for [place].  CXR done prior to departure showed no PNA or pulmonary edema.  After flying to [place], she became significantly more short of breath and ended up in the hospital.  Her feet had swollen after the airplane flight.  In the ER, she was told she had CHF/"fluid around her heart."  She was admitted for 3 days and given Lasix.  She says she had a stress test and was told that it was negative.  She was put on an antibiotic but denies fever and says she was told that she did not have PNA.  She was not discharged on Lasix.  She is home now and says that she feels pretty much back to normal.  She denies any chest pain.  She is now short of breath after walking about 50-100 yards which is her long-time baseline.  No orthopnea or PND.  Generalized fatigue.  We doe not yet have her records from [place].  BNP today was not elevated.   ECG: NSR, 1st degree AV block  Labs (3/10): LDL 64, HDL 40, creatinine 0.8 Labs (11/10): BNP 75, K 3.7, creatinine 1 Labs (1/11): HDL 43, LDL 66 Labs (3/11): BNP 55   Current Medications (verified): 1)  Plavix 75 Mg Tabs (Clopidogrel Bisulfate) .... Take One Tablet By Mouth Daily 2)  Nitroglycerin 0.4 Mg Subl (Nitroglycerin) .Marland Kitchen.. 1 Tablet Under Tongue As Directed 3)  Toprol Xl 50 Mg Xr24h-Tab (Metoprolol Succinate) .... Take 1 1/2 Tablet By Mouth Once A Day 4)  Aspirin 81 Mg Tabs (Aspirin) .... Once Daily 5)  Crestor 10 Mg Tabs (Rosuvastatin Calcium) .Marland Kitchen.. 1 Daily 6)   Losartan Potassium 50 Mg Tabs (Losartan Potassium) .Marland Kitchen.. 1 Daily 7)  Coenzyme Q10 100 Mg Caps (Coenzyme Q10) .Marland Kitchen.. 1 Daily 8)  Protonix 40 Mg Tbec (Pantoprazole Sodium) .... One Daily  Allergies (verified): 1)  ! Pcn 2)  ! Biaxin 3)  * Contrast Dye  Past History:  Past Medical History: Reviewed history from 07/14/2009 and no changes required. 1. Coronary artery disease.  Left heart catheterization was done in September 2009 because of exertional chest pain.  It showed an EF of 60% on left ventriculogram, RCA was totally occluded proximally with left-to-right collaterals, the distal left main had a 20% lesion, there was a small ramus with a 60-70% stenosis, and the mid LAD had  serial 60% stenoses with a distal subtotalled LAD.  There was a 50% first diagonal stenosis and a 50% first OM stenosis. There were poor distal targets for bypass surgery and no good interventional target so medical management was planned. 2. Hypertension. 3. Peptic ulcer disease. 4. Hysterectomy. 5. History of  HBV, HCV 6. History of chronic cough.  ACEI stopped.  Possible contribution from GERD. 7. History of PVCs and PACs.  8. GERD 9. IBS 10. Depression 11. Hyperlipidemia 12. Obesity 13. h/o pancreatitis 14. Osteoporosis 15. Knee osteoarthritis 16. Echo (3/10): EF 55-60%, no regional wall motion abnormalities, aortic sclerosis. 17. PFTs (10/10): FVC 103%,  FEV1 109%, ratio 74%.  Mild obstruction.   Family History: Reviewed history from 02/11/2009 and no changes required. Noncontributory.  Social History: Reviewed history from 02/13/2009 and no changes required. The patient does not drink or smoke (never smoked).  She lives with her husband.   Review of Systems       All systems reviewed and negative except as per HPI.   Vital Signs:  Patient profile:   75 year old female Height:      62 inches Weight:      190 pounds BMI:     34.88 Pulse rate:   61 / minute Pulse rhythm:   regular BP sitting:    136 / 89  (left arm) Cuff size:   large  Vitals Entered By: Judithe Modest CMA (August 21, 2009 9:07 AM)  Physical Exam  General:  Well developed, well nourished, in no acute distress. Neck:  Neck supple, no JVD. No masses, thyromegaly or abnormal cervical nodes. Lungs:  Clear bilaterally to auscultation and percussion. Heart:  Non-displaced PMI, chest non-tender; regular rate and rhythm, S1, S2 without rubs or gallops. 2/6 early systolic ejection murmur at RUSB.  Carotid upstroke normal, no bruit.  Pedals normal pulses. No edema, no varicosities. Abdomen:  Bowel sounds positive; abdomen soft and non-tender without masses, organomegaly, or hernias noted. No hepatosplenomegaly. Extremities:  No clubbing or cyanosis. Neurologic:  Alert and oriented x 3. Psych:  Normal affect.   Impression & Recommendations:  Problem # 1:  SHORTNESS OF BREATH (ICD-786.05) Patient seems to be back to her baseline level of exertional dyspnea.  I am not sure what happened in [place].  It does not appear that she had PNA.  She is not volume overloaded on exam today and her BNP is normal.  I assume that they would have ruled her out for PE in the hospital there.  She says she was told that the stress test done in [place] was normal.  Could she have had acute bronchitis or a viral illness?  I am going to leave her medications the same for now.  I do not think that she needs Lasix.  I will get an echocardiogram to evaluate systolic and diastolic function. We have asked for records to be sent from the hospital in [place].    Problem # 2:  CAD, NATIVE VESSEL (ICD-414.01) Patient has extensive CAD but no good options for revascularization.  Her EF is preserved without resting wall motion abnormalities on prior echo.  She has not had any chest pain.  She will continue ASA, Plavix, Toprol XL, Crestor, and losartan.  She is on Protonix due to history of gastric ulcer.  As above, she apparently had a stress test when  hospitalized this month in [place] that was unremarkable.  We will try to get a copy of the stress test.    Problem # 3:  HYPERLIPIDEMIA (ICD-272.4) Lipids at goal on Crestor.    Problem # 4:  HYPERTENSION (ICD-401.9) BP ok today.   Other Orders: EKG w/ Interpretation (93000) Echocardiogram (Echo) TLB-BNP (B-Natriuretic Peptide) (83880-BNPR) TLB-BMP (Basic Metabolic Panel-BMET) (80048-METABOL) TLB-TSH (Thyroid Stimulating Hormone) (04540-JWJ)  Patient Instructions: 1)  Your physician has requested that you have an echocardiogram.  Echocardiography is a painless test that uses sound waves to create images of your heart. It provides your doctor with information about the size and shape of your heart and how well your heart's chambers and valves are working.  This procedure takes approximately one hour. There  are no restrictions for this procedure.  2)  Your physician recommends that you have  lab work today--BMP/BNP/TSH   786.09 414.01 3)  Your physician recommends that you schedule a follow-up appointment in: 2 weeks with Dr Shirlee Latch

## 2010-07-06 NOTE — Progress Notes (Signed)
Summary: GOING OUT TOWN HAVE QUESTIONS ABOUT HEART.  Phone Note Call from Patient Call back at [facility] Phone [phone]   Caller: Patient Summary of Call: PT GOING OUT TOWN AND HAVE QUESTIONS REGARDING HER HEART PROBLEMS Initial call taken by: Judie Grieve,  August 10, 2009 10:46 AM  Follow-up for Phone Call        Pt is flying out to [place] for a death in the family. She is concerned because she has been SOB since last week. She has not had any other heart related issues. She also had an anxiety attack last week which she has not had in year. She is aware that Dr. Shirlee Latch is out of the office. I ask her to f/u with her PCP.  Follow-up by: Duncan Dull, RN, BSN,  August 10, 2009 11:20 AM     Appended Document:  GOING OUT TOWN HAVE QUESTIONS ABOUT HEART. She needs to make sure to take all of her medicines.  Has she had any chest pain? If not, could be anxiety making her short of breath but would also consider bronchitis or PNA.  May be a good idea to get a CXR.  She should see either me or her PCP if she significantly short of breath.   Appended Document: GOING OUT TOWN HAVE QUESTIONS ABOUT HEART. Pt has not heard back from her PCP's office. She is leaving at 0615 in the morning from [place]. She sounded SOB this time on the phone. She is on her way to a hair appt and does not have time to make it here before but she will come afterwards to have a CXR done. I will send this message to Dr. Shirlee Latch to see if he can look at it today or if Dr. Ladona Ridgel DOD will need to look at it.  Appended Document: GOING OUT TOWN HAVE QUESTIONS ABOUT HEART. No pneumonia or edema on the CXR.  Please have her followup with me asap.  If she worsens while she is out of town will need to go to the ER.   Appended Document: GOING OUT TOWN HAVE QUESTIONS ABOUT HEART. Called patient and left message on machine with instructions in hopes that she checks her messages. I will forward to Thurston Hole so she can f/u and get her in  to see Dr. Shirlee Latch.

## 2010-07-06 NOTE — Procedures (Signed)
Summary: Colonoscopy/[facility]  Colonoscopy/[facility]   Imported By: Sherian Rein 05/12/2009 07:28:05  _____________________________________________________________________  External Attachment:    Type:   Image     Comment:   External Document

## 2010-07-06 NOTE — Assessment & Plan Note (Signed)
Summary: cough/ and mucus   Visit Type:  Initial Consult Primary Provider/Referring Provider:  Dr. [facility]  CC:  Pt c/o cough x 2 years getting worse. Pt c/o wheezing, SOB, and tightness around waist.  History of Present Illness: 75/F never smoker with CAD for evaluation of cough & wheezing x 3-4 months. This was of insidious onset  - no clear URI symptoms preceding, seemed to get worse while working in her garden & at night - initially treted for bronchitis with neb inthe office & oral prednisone & antibiotic but cough returned as soon as she completed pred course. A cortisone shot inher left knee seems to have given her the greatest relief. She had an ER visit during a trip to [place], reviewed dc summary, as told she had 'congestive heart failure'  - echoi nml LV fn, BNP 55, stress myoview nml, given cipro for 'bronchitis.' She seems to think that she was given nitroglycerin for cough by dr Jarold Motto. Review of CXR 08/10/09 shows bibasal interstitial prominence, lt volume loss with raised hemid iaphragm CT angio chest 8/09 was neg for PE, stable RUL 5mm nodule as compared to 05/2005, no obvious fibrosis. She reports good control of heartburn with protonix , had an EGD by Dr Loreta Ave in 11/10 She denies seasonal allergies PFTs in 10/10 were essentially nml, no obstruction, nml lung volumes & diffusion  Preventive Screening-Counseling & Management  Alcohol-Tobacco     Smoking Status: never   Current Medications (verified): 1)  Plavix 75 Mg Tabs (Clopidogrel Bisulfate) .... Take One Tablet By Mouth Daily 2)  Nitroglycerin 0.4 Mg Subl (Nitroglycerin) .Marland Kitchen.. 1 Tablet Under Tongue As Directed 3)  Toprol Xl 50 Mg Xr24h-Tab (Metoprolol Succinate) .... Take 1 1/2 Tablet By Mouth Once A Day 4)  Aspirin 81 Mg Tabs (Aspirin) .... Once Daily 5)  Losartan Potassium 25 Mg Tabs (Losartan Potassium) .... One Tablet Daily 6)  Coenzyme Q10 100 Mg Caps (Coenzyme Q10) .Marland Kitchen.. 1 Daily 7)   Protonix 40 Mg Tbec (Pantoprazole Sodium) .... One Daily 8)  Align  Caps (Probiotic Product) .... Once Daily 9)  Crestor 10 Mg Tabs (Rosuvastatin Calcium) .... One Tablet Daily 10)  Multivitamins   Tabs (Multiple Vitamin) .... Take 1 Tablet By Mouth Once A Day  Allergies (verified): 1)  ! Pcn 2)  ! Biaxin 3)  * Contrast Dye  Past History:  Past Medical History: Last updated: 09/09/2009 1. Coronary artery disease.  Left heart catheterization was done in September 2009 because of exertional chest pain.  It showed an EF of 60% on left ventriculogram, RCA was totally occluded proximally with left-to-right collaterals, the distal left main had a 20% lesion, there was a small ramus with a 60-70% stenosis, and the mid LAD had  serial 60% stenoses with a distal subtotalled LAD.  There was a 50% first diagonal stenosis and a 50% first OM stenosis. There were poor distal targets for bypass surgery and no good interventional target so medical management was planned. Myoview in [place] in 3/11 was normal per report.  2. Hypertension. 3. Peptic ulcer disease. 4. Hysterectomy. 5. History of  HBV, HCV 6. History of chronic cough.  ACEI stopped.  Possible contribution from GERD. 7. History of PVCs and PACs.  8. GERD 9. IBS 10. Depression 11. Hyperlipidemia 12. Obesity 13. h/o pancreatitis 14. Osteoporosis 15. Knee osteoarthritis 16. Echo (3/10): EF 55-60%, no regional wall motion abnormalities, aortic sclerosis.  Echo (3/11) in [place]: EF 60%, grade I diastolic dysfunction, normal RV,  no regional wall motion abnormalities, valves are ok.  17. PFTs (10/10): FVC 103%, FEV1 109%, ratio 74%.  Mild obstruction.   Past Surgical History: Cholecystectomy Hysterectomy Pancreatitic Duct Stent Tubal Ligation-1995  Family History: Family History Asthma-sister, mother Family History Emphysema-sister Family History Rheumatoid Arthritis-mother Family History Colon Cancer-maternal aunt Family  History Lung Cancer-maternal aunt Pancreatic Cancer-brother Family History C V A / Stroke -mother  Social History: The patient does not drink or smoke (never smoked).   Marital Status:Married lives with husband  Children: Yes Occupation: Retired [employer]  Patient never smoked. (2nd hand smoke exposure x 40 years)  Review of Systems       The patient complains of shortness of breath with activity, shortness of breath at rest, productive cough, irregular heartbeats, acid heartburn, indigestion, weight change, abdominal pain, headaches, nasal congestion/difficulty breathing through nose, sneezing, ear ache, anxiety, hand/feet swelling, and joint stiffness or pain.  The patient denies non-productive cough, coughing up blood, chest pain, loss of appetite, difficulty swallowing, sore throat, tooth/dental problems, itching, depression, rash, change in color of mucus, and fever.    Vital Signs:  Patient profile:   75 year old female Height:      62 inches Weight:      188 pounds O2 Sat:      95 % on Room air Temp:     98.6 degrees F oral Pulse rate:   64 / minute BP sitting:   122 / 76  (right arm) Cuff size:   large  Vitals Entered By: Zackery Barefoot CMA (Oct 29, 2009 2:45 PM)  O2 Flow:  Room air CC: Pt c/o cough x 2 years getting worse. Pt c/o wheezing, SOB, tightness around waist   Physical Exam  Additional Exam:  Gen. Pleasant, well-nourished, in no distress, normal affect ENT - no lesions, no post nasal drip Neck: No JVD, no thyromegaly, no carotid bruits Lungs: no use of accessory muscles, no dullness to percussion, clear without rales or rhonchi  Cardiovascular: Rhythm regular, heart sounds  normal, no murmurs or gallops, no peripheral edema Abdomen: soft and non-tender, no hepatosplenomegaly, BS normal. Musculoskeletal: No deformities, no cyanosis or clubbing Neuro:  alert, non focal     Impression & Recommendations:  Problem # 1:  COUGH  (ICD-786.2) Unclear cause - obtain HRCT to r/o pulm fibrosis  - interstitial prominence with Nml EF & BNP Meanwhile treat for upper airway cough & GERD  Trial of nasal spray & qvar 80 1 puff two times a day  Stay on mucinex DM - call if no bette rin 1 week & we can use stronger  cough syrup Orders: Radiology Referral (Radiology) Consultation Level IV (16109) Prescription Created Electronically [phone])  Problem # 2:  SHORTNESS OF BREATH (ICD-786.05)  Unclear cause with nml lung & cardiac function If CT nml, will treat as reactive airways  Orders: Consultation Level IV (09811) Prescription Created Electronically [id number])  Medications Added to Medication List This Visit: 1)  Multivitamins Tabs (Multiple vitamin) .... Take 1 tablet by mouth once a day 2)  Prednisone 10 Mg Tabs (Prednisone) .... 2 tabs once daily with food x 1 wk, then 1 tab once daily x 1 wk 3)  Nasonex 50 Mcg/act Susp (Mometasone furoate) .Marland Kitchen.. 1 spray each nostril at bedtime   lot # [id number] exp: feb 2013  Patient Instructions: 1)  Copy sent to: Dr August Albino 2)  Please schedule a follow-up appointment in 2 weeks. 3)  A High Resolution Chest CT  has been recommended.  Your imaging study may require preauthorization.  4)  Trial of nasal spray & qvar 80 1 puff two times a day  5)  Stay on mucinex DM - call if no bette rin 1 week & we can use stronger  cough syrup Prescriptions: NASONEX 50 MCG/ACT SUSP (MOMETASONE FUROATE) 1 spray each nostril at bedtime   lot # [id number] exp: Feb 2013  #1 x 0   Entered by:   Zackery Barefoot CMA   Authorized by:   Comer Locket. Vassie Loll MD   Signed by:   Zackery Barefoot CMA on 10/29/2009   Method used:   Samples Given   RxID:   [id number] PREDNISONE 10 MG TABS (PREDNISONE) 2 tabs once daily with food x 1 wk, then 1 tab once daily x 1 wk  #30 x 0   Entered and Authorized by:   Comer Locket Vassie Loll MD   Signed by:   Comer Locket Vassie Loll MD on 10/29/2009   Method used:   Electronically to         [facility] [id number] ([facility])       [address]       [place], [place]  [address]       Ph: [phone] or [phone]       Fax: [phone]   RxID:   [id number]

## 2010-07-06 NOTE — Assessment & Plan Note (Signed)
Summary: sob/jd   Visit Type:  Acute visit Primary Provider/Referring Provider:  Dr. [facility]  CC:  Pt c/o increased SOB x 1 week and productive cough with white mucus. Pt was seen and treated at [facility] .  History of Present Illness: 10/29/09 - initial 74/F never smoker with CAD for evaluation of cough & wheezing x 3-4 months due to reactive airways. This was of insidious onset  - no clear URI symptoms preceding, seemed to get worse while working in her garden & at night - initially treated for bronchitis with neb inthe office & oral prednisone & antibiotic but cough returned as soon as she completed pred course. A cortisone shot inher left knee seems to have given her the greatest relief. She had an ER visit during a trip to [place]  - echoi nml LV fn, BNP 55, stress myoview nml, given cipro for 'bronchitis.' Review of CXR 08/10/09 shows bibasal interstitial prominence, lt volume loss with raised hemid iaphragm CT angio chest 8/09 was neg for PE, stable RUL 5mm nodule as compared to 05/2005, no obvious fibrosis. She reports good control of heartburn with protonix , had an EGD by Dr Loreta Ave in 11/10 PFTs in 10/10 were essentially nml, no obstruction, nml lung volumes & diffusion  November 23, 2009 3:44 PM  Cough/ wheezing much improved with oral prednisone - not taking qvar daily since better. She had a sleep study at [facility] neurology. She is needing cortisone shots for her knee every month. Spirometry shows nml lung function  January 04, 2010 11:12 AM  Exacerbation while on Toprol  required prednisone20 mg - did not tolerate nevibulol -rash. Developed bspasm again once prednisone tapered. Also c/o bruising over thighs & legs. Worried about her heart if toprol stopped. RAST neg  January 18, 2010 2:27 PM  ER visit on 01/09/10 - absolute eos count 800 ,BNP 69, CXR - cardiomegaly, was on prednisone . Diffuse wheezing & coughing today - ofered hospital admission but she has grandkids over.  Given depot shot  Preventive Screening-Counseling & Management  Alcohol-Tobacco     Smoking Status: never  Current Medications (verified): 1)  Plavix 75 Mg Tabs (Clopidogrel Bisulfate) .... Take One Tablet By Mouth Daily 2)  Nitroglycerin 0.4 Mg Subl (Nitroglycerin) .Marland Kitchen.. 1 Tablet Under Tongue As Directed 3)  Toprol Xl 50 Mg Xr24h-Tab (Metoprolol Succinate) .... One and One-Half Daily- On Hold 4)  Aspirin 81 Mg Tabs (Aspirin) .... Once Daily Hold 5)  Losartan Potassium 25 Mg Tabs (Losartan Potassium) .... Take 1 Tablet By Mouth Once Daily 6)  Coenzyme Q10 100 Mg Caps (Coenzyme Q10) .Marland Kitchen.. 1 Daily 7)  Protonix 40 Mg Tbec (Pantoprazole Sodium) .... One Daily As Needed 8)  Align  Caps (Probiotic Product) .... Once Daily 9)  Crestor 20 Mg Tabs (Rosuvastatin Calcium) .... One Tablet Daily 10)  Multivitamins   Tabs (Multiple Vitamin) .... Take 1 Tablet By Mouth Once A Day 11)  Nasonex 50 Mcg/act Susp (Mometasone Furoate) .Marland Kitchen.. 1 Spray Each Nostril At Bedtime   Lot # [id number] Exp: Feb 2013 As Needed 12)  Advair Diskus 250-50 Mcg/dose Aepb (Fluticasone-Salmeterol) .Marland Kitchen.. 1 Puff Two Times A Day 13)  Albuterol Inhaler .Marland Kitchen.. 1 Puff As Needed 14)  Albuterol Sulfate (2.5 Mg/28ml) 0.083% Nebu (Albuterol Sulfate) .... Four Times A Day 15)  Prednisone 10 Mg Tabs (Prednisone) .... Taper  Allergies (verified): 1)  ! Pcn 2)  ! Biaxin 3)  * Contrast Dye  Past History:  Past Medical History: Last  updated: 11/25/2009 1. Coronary artery disease.  Left heart catheterization was done in September 2009 because of exertional chest pain.  It showed an EF of 60% on left ventriculogram, RCA was totally occluded proximally with left-to-right collaterals, the distal left main had a 20% lesion, there was a small ramus with a 60-70% stenosis, and the mid LAD had  serial 60% stenoses with a distal subtotalled LAD.  There was a 50% first diagonal stenosis and a 50% first OM stenosis. There were poor distal targets for bypass  surgery and no good interventional target so medical management was planned. Myoview in [place] in 3/11 was normal per report.  2. Hypertension. 3. Peptic ulcer disease. 4. Hysterectomy. 5. History of  HBV, HCV 6. History of chronic cough.  ACEI stopped.  Possible contribution from GERD. 7. History of PVCs and PACs.  8. GERD 9. IBS 10. Depression 11. Hyperlipidemia 12. Obesity 13. h/o pancreatitis 14. Osteoporosis 15. Knee osteoarthritis 16. Dyspnea/cough: Echo (3/10): EF 55-60%, no regional wall motion abnormalities, aortic sclerosis.  Echo (3/11) in [place]: EF 60%, grade I diastolic dysfunction, normal RV, no regional wall motion abnormalities, valves are ok.  PFTs (10/10): FVC 103%, FEV1 109%, ratio 74%.  Mild obstruction.  CT chest (6/11) did not show evidence for interstitial lung disease.  Possible upper airways cough syndrome.    Past Surgical History: Last updated: 10/29/2009 Cholecystectomy Hysterectomy Pancreatitic Duct Stent Tubal Ligation-1995  Social History: Last updated: 10/29/2009 The patient does not drink or smoke (never smoked).   Marital Status:Married lives with husband  Children: Yes Occupation: Retired [employer]  Patient never smoked. (2nd hand smoke exposure x 40 years)  Review of Systems       The patient complains of dyspnea on exertion.  The patient denies anorexia, fever, weight loss, weight gain, vision loss, decreased hearing, hoarseness, chest pain, syncope, peripheral edema, prolonged cough, headaches, hemoptysis, abdominal pain, melena, hematochezia, severe indigestion/heartburn, hematuria, muscle weakness, suspicious skin lesions, difficulty walking, depression, unusual weight change, and abnormal bleeding.    Vital Signs:  Patient profile:   75 year old female Height:      62.5 inches Weight:      187.38 pounds BMI:     33.85 O2 Sat:      98 % on Room air Temp:     99.6 degrees F oral Pulse rate:   91 / minute BP  sitting:   160 / 90  (left arm) Cuff size:   large  Vitals Entered By: Zackery Barefoot CMA (January 18, 2010 2:14 PM)  O2 Flow:  Room air CC: Pt c/o increased SOB x 1 week, productive cough with white mucus. Pt was seen and treated at [facility]  Comments Medications reviewed with patient Verified contact number and pharmacy with patient Zackery Barefoot CMA  January 18, 2010 2:15 PM    Physical Exam  Additional Exam:  Gen. Pleasant, well-nourished, in no distress, normal affect ENT - no lesions, no post nasal drip Neck: No JVD, no thyromegaly, no carotid bruits Lungs: no use of accessory muscles, no dullness to percussion, no rales , diffuse rhonchi  Cardiovascular: Rhythm regular, heart sounds  normal, no murmurs or gallops, no peripheral edema Musculoskeletal: No deformities, no cyanosis or clubbing      Impression & Recommendations:  Problem # 1:  REACTIVE AIRWAY DISEASE (ICD-493.90) Unclear cause of exacerbation of reactive airways - no obvious allergies, beta blocker probably not responsible, singulair has not helped. ? Aspirain sensitivity Offered her hospital  admission but she refused - grandkids at home_ to ER if worse. Solumedrol 80 mg IM now Take 60 mg prednisone till you see Tammy Stay on advair two times a day , stay on singulair Stop taking aspirin Nebuliser with albuterol three times a day as needed  FU arranged for tomorrow with NP  Medications Added to Medication List This Visit: 1)  Aspirin 81 Mg Tabs (Aspirin) .... Once daily hold 2)  Protonix 40 Mg Tbec (Pantoprazole sodium) .... One daily as needed 3)  Nasonex 50 Mcg/act Susp (Mometasone furoate) .Marland Kitchen.. 1 spray each nostril at bedtime   lot # [id number] exp: feb 2013 as needed 4)  Albuterol Sulfate (2.5 Mg/66ml) 0.083% Nebu (Albuterol sulfate) .... Four times a day 5)  Prednisone 10 Mg Tabs (Prednisone) .... Taper  Other Orders: T-Antinuclear Antib (ANA) [phone]) T-Angiotensin i-Converting Enzyme  ([id number]) DME Referral (DME) Radiology Referral (Radiology) Depo- Medrol 80mg  (J1040) Admin of Therapeutic Inj  intramuscular or subcutaneous (30865) TLB-Sedimentation Rate (ESR) (85652-ESR) Est. Patient Level IV (78469)  Patient Instructions: 1)  Copy sent to: 2)  Please schedule a follow-up appointment tomorrow with TP. 3)  Solumedrol 80 mg IM now 4)  Take 60 mg prednisone till you see Tammy 5)  Stay on advair two times a day  6)  Stop taking aspirin 7)  Nebuliser with albuterol three times a day as needed    Medication Administration  Injection # 1:    Medication: Depo- Medrol 80mg     Diagnosis: SHORTNESS OF BREATH (ICD-786.05)    Route: IM    Site: LUOQ gluteus    Exp Date: 09/2012    Lot #: [id number]    Mfr: Pharmacia    Patient tolerated injection without complications    Given by: Zackery Barefoot CMA (January 18, 2010 4:19 PM)  Orders Added: 1)  T-Antinuclear Antib (ANA) [[id number]] 2)  T-Angiotensin i-Converting Enzyme [[id number]] 3)  DME Referral [DME] 4)  Radiology Referral [Radiology] 5)  Depo- Medrol 80mg  [J1040] 6)  Admin of Therapeutic Inj  intramuscular or subcutaneous [96372] 7)  TLB-Sedimentation Rate (ESR) [85652-ESR] 8)  Est. Patient Level IV [66440]

## 2010-07-06 NOTE — Letter (Signed)
Summary: [facility]  [facility]   Imported By: Lester Joaquin 11/12/2009 08:09:39  _____________________________________________________________________  External Attachment:    Type:   Image     Comment:   External Document

## 2010-07-06 NOTE — Progress Notes (Signed)
Summary: discuss meds w/doctor  Phone Note Call from Patient Call back at Home Phone [phone] Call back at [phone]   Caller: Patient Call For: [name] [name] Reason for Call: Talk to Nurse Summary of Call: Need to speak to you about meds you put her on.  Crestor and prednisone is messing up her bones per Dr. Dareen Piano.  Pt wants to discuss this with you. Initial call taken by: Eugene Gavia,  February 18, 2010 1:01 PM  Follow-up for Phone Call        Spoke with pt.  She states that she was seen by Dr Dareen Piano per RAs request.  She had some "special blood test" and states that Dr Dareen Piano told her that she has "bone disease" and that this was coming from taking prednisone and crestor.  She wanted RA to be aware of this.  She states that she has tapered off of prednisone, but had some dry cough and chest tightness a few days ago and took 50 mg prednisone that day and 40 mg the next day and then she stopped.  She states that this helped resolve cough and chest tightness. She wants to know what she can do in the future for her problems since she was told to stay away from prednisone.  I called Dr Ewell Poe to request ov note and labs. Will give to JR once recieved. Follow-up by: Vernie Murders,  February 18, 2010 2:09 PM  Additional Follow-up for Phone Call Additional follow up Details #1::        Reviewed Dr Ewell Poe notes - favors OA rather than RA, CCP neg, CK was high 709 - muscle cramps.  Discussed side efects of steroids with her but explained that it is needed for bspasm.  pl make FU OV in 1-2 weeks  Additional Follow-up by: Comer Locket. [name] Loll MD,  February 18, 2010 2:38 PM    Additional Follow-up for Phone Call Additional follow up Details #2::    pt scheduled to see RA on 03-01-10 at 9:45. Carron Curie CMA  February 19, 2010 12:03 PM

## 2010-07-06 NOTE — Miscellaneous (Signed)
Summary: Orders Update pft charges  Clinical Lists Changes  Orders: Added new Service order of Carbon Monoxide diffusing w/capacity (94720) - Signed Added new Service order of Lung Volumes (94240) - Signed Added new Service order of Spirometry (Pre & Post) (94060) - Signed 

## 2010-07-06 NOTE — Progress Notes (Signed)
Summary: fluid around her heart  Phone Note Call from Patient Call back at Home Phone [phone]   Caller: Patient Reason for Call: Talk to Nurse Summary of Call: PCP states fluid build up around her heart.... he order nitroglycerine patches, request call back Initial call taken by: Migdalia Dk,  Oct 27, 2009 9:13 AM  Follow-up for Phone Call        went to PCP's, Dr Doylene Bode) about 1 week ago having problems with SOB--was given breathing treatment last week along with Prednisone pt continues to have dry cough--pt denies increase in weigh(she states she has lost weight) or edema--she states she had CXR at Dr Silvano Rusk last week--she states  she has been getting frequent colds and a constant dry cough-pt states Dr Eloise Harman was going to refer her to a pulmonologist but she has not heard from Dr Silvano Rusk office about that -appointment --I offered pt appt with Dr Shirlee Latch 10-28-09 but in the end she decided to follow-up with Dr Eloise Harman and was going to call me back if she decided she wanted to see Dr Shirlee Latch 10-28-09--I reviewed with Dr Shirlee Latch and he agreed pt should follow-up with Dr Eloise Harman about a referral to a pulmonologist

## 2010-07-06 NOTE — Letter (Signed)
Summary: [facility]  [facility]   Imported By: Esmeralda Links D'jimraou 06/13/2007 15:09:52  _____________________________________________________________________  External Attachment:    Type:   Image     Comment:   External Document

## 2010-07-06 NOTE — Assessment & Plan Note (Signed)
Summary: rov   Primary Provider:  Dr. [facility]  CC:  ROV- F/U CP; Pt never started Imdur.  History of Present Illness: 75 yo with diffuse CAD not amenable to intervention and chronic dyspnea/cough presents for cardiology followup.  She has seen Dr. Vassie Loll and is now on a beclomethasone inhaler.  Chest CT showed no interstitial lung disease.  She is doing better with almost complete resolution of her cough.  She has more energy.  No episodes of chest pain and no significant exertional dyspnea now.  She continues to have pain in her left knee from osteoarthritis.  She is set up for an arthroscopic surgery on that knee.  She plans to start swimming for exercise.   ECG: NSR, ? old inferior MI  Current Medications (verified): 1)  Plavix 75 Mg Tabs (Clopidogrel Bisulfate) .... Take One Tablet By Mouth Daily 2)  Nitroglycerin 0.4 Mg Subl (Nitroglycerin) .Marland Kitchen.. 1 Tablet Under Tongue As Directed 3)  Toprol Xl 50 Mg Xr24h-Tab (Metoprolol Succinate) .... Take 1 1/2 Tablet By Mouth Once A Day 4)  Aspirin 81 Mg Tabs (Aspirin) .... Once Daily 5)  Losartan Potassium 25 Mg Tabs (Losartan Potassium) .... Take 1 Tablet By Mouth Two Times A Day 6)  Coenzyme Q10 100 Mg Caps (Coenzyme Q10) .Marland Kitchen.. 1 Daily 7)  Protonix 40 Mg Tbec (Pantoprazole Sodium) .... One Daily 8)  Align  Caps (Probiotic Product) .... Once Daily 9)  Crestor 10 Mg Tabs (Rosuvastatin Calcium) .... One Tablet Daily 10)  Multivitamins   Tabs (Multiple Vitamin) .... Take 1 Tablet By Mouth Once A Day 11)  Nasonex 50 Mcg/act Susp (Mometasone Furoate) .Marland Kitchen.. 1 Spray Each Nostril At Bedtime   Lot # [id number] Exp: Feb 2013 12)  Qvar 80 Mcg/act Aers (Beclomethasone Dipropionate) .Marland Kitchen.. 1 Puff Two Times A Day  Allergies: 1)  ! Pcn 2)  ! Biaxin 3)  * Contrast Dye  Past History:  Past Medical History: 1. Coronary artery disease.  Left heart catheterization was done in September 2009 because of exertional chest pain.  It showed an EF of 60% on  left ventriculogram, RCA was totally occluded proximally with left-to-right collaterals, the distal left main had a 20% lesion, there was a small ramus with a 60-70% stenosis, and the mid LAD had  serial 60% stenoses with a distal subtotalled LAD.  There was a 50% first diagonal stenosis and a 50% first OM stenosis. There were poor distal targets for bypass surgery and no good interventional target so medical management was planned. Myoview in [place] in 3/11 was normal per report.  2. Hypertension. 3. Peptic ulcer disease. 4. Hysterectomy. 5. History of  HBV, HCV 6. History of chronic cough.  ACEI stopped.  Possible contribution from GERD. 7. History of PVCs and PACs.  8. GERD 9. IBS 10. Depression 11. Hyperlipidemia 12. Obesity 13. h/o pancreatitis 14. Osteoporosis 15. Knee osteoarthritis 16. Dyspnea/cough: Echo (3/10): EF 55-60%, no regional wall motion abnormalities, aortic sclerosis.  Echo (3/11) in [place]: EF 60%, grade I diastolic dysfunction, normal RV, no regional wall motion abnormalities, valves are ok.  PFTs (10/10): FVC 103%, FEV1 109%, ratio 74%.  Mild obstruction.  CT chest (6/11) did not show evidence for interstitial lung disease.  Possible upper airways cough syndrome.    Family History: Reviewed history from 10/29/2009 and no changes required. Family History Asthma-sister, mother Family History Emphysema-sister Family History Rheumatoid Arthritis-mother Family History Colon Cancer-maternal aunt Family History Lung Cancer-maternal aunt Pancreatic Cancer-brother Family History C V  A / Stroke -mother  Social History: Reviewed history from 10/29/2009 and no changes required. The patient does not drink or smoke (never smoked).   Marital Status:Married lives with husband  Children: Yes Occupation: Retired [employer]  Patient never smoked. (2nd hand smoke exposure x 40 years)  Review of Systems       All systems reviewed and negative except as per  HPI.   Vital Signs:  Patient profile:   75 year old female Height:      62.5 inches Weight:      185 pounds BMI:     33.42 Pulse rate:   63 / minute BP sitting:   112 / 70  (left arm) Cuff size:   regular  Vitals Entered By: Stanton Kidney, EMT-P (November 25, 2009 8:56 AM)  Physical Exam  General:  Well developed, well nourished, in no acute distress. Neck:  Neck supple, no JVD. No masses, thyromegaly or abnormal cervical nodes. Lungs:  Clear bilaterally to auscultation and percussion. Heart:  Non-displaced PMI, chest non-tender; regular rate and rhythm, S1, S2 without rubs or gallops. 2/6 early systolic ejection murmur at RUSB.  Carotid upstroke normal, no bruit.  Pedals normal pulses. No edema, no varicosities. Abdomen:  Bowel sounds positive; abdomen soft and non-tender without masses, organomegaly, or hernias noted. No hepatosplenomegaly. Extremities:  No clubbing or cyanosis. Neurologic:  Alert and oriented x 3. Psych:  Normal affect.   Impression & Recommendations:  Problem # 1:  CAD, NATIVE VESSEL (ICD-414.01) Patient has extensive CAD but no good options for revascularization.  Her EF was preserved without resting wall motion abnormalities on recent echo in [place].  Myoview in [place] showed no ischemia.  She has not had any chest pain.  She will continue ASA, Plavix, Toprol XL, Crestor, and losartan.    Problem # 2:  SHORTNESS OF BREATH (ICD-786.05) Shortness of breath and cough seems to have essentially resolved.  She has been seeing Dr. Vassie Loll with pulmonary.  She had a chest CT without evidence for interstitial lung disease.  She is now on a beclomethasone inhaler.  She may have upper airways cough syndrome.    Problem # 3:  HYPERLIPIDEMIA (ICD-272.4) Lipids/LFTs today.  Goal LDL < 70.   Problem # 4:  PREOPERATIVE EVALUATION Patient needs a low risk surgery, knee arthroscopy.  I think that this is reasonable without any further cardiac testing.  If this could be done  by spinal block it would be ideal, but I think that she could tolerate general anesthesia if needed.  She can stop Plavix 5 days before surgery.  She should continue ASA 81 through the surgery if possible.  Restart Plavix as soon as possible after the surgery.   Other Orders: TLB-Hepatic/Liver Function Pnl ([id number]) TLB-Lipid Panel ([id number])  Patient Instructions: 1)  Your physician recommends that you have lab today--liver profile/lipid profile--414.01 786.09 272.0 2)  Your physician wants you to follow-up in: 6 months with Dr Shirlee Latch.  You will receive a reminder letter in the mail two months in advance. If you don't receive a letter, please call our office to schedule the follow-up appointment.

## 2010-07-06 NOTE — Assessment & Plan Note (Signed)
Summary: per check out/sf   Primary Provider:  Jarome Matin   History of Present Illness: 75 yo with history of CAD (no good options for revascularization) but preserved LV systolic function presents for cardiology followup. She is doing well in general.  She rides her exercise bike for 30 minutes 3 times a week and also has been walking for exercise.  No shortness of breath with exertion.  No chest pain at all.  She has been off aspirin for about 3 months after an EGD showed a superficial gastric ulcer.  She is not taking a PPI at this time.    ECG: NSR, PVCs, borderline LVH, Q in lead III  Labs (3/10): LDL 64, HDL 40, creatinine 0.8 Labs (11/10): BNP 75, K 3.7, creatinine 1 Labs (1/11): HDL 43, LDL 66  Current Medications (verified): 1)  Plavix 75 Mg Tabs (Clopidogrel Bisulfate) .... Take One Tablet By Mouth Daily 2)  Nitroglycerin 0.4 Mg Subl (Nitroglycerin) .Marland Kitchen.. 1 Tablet Under Tongue As Directed 3)  Toprol Xl 50 Mg Xr24h-Tab (Metoprolol Succinate) .... Take 1 1/2 Tablet By Mouth Once A Day 4)  Aspirin 81 Mg Tabs (Aspirin) .... Once Daily 5)  Crestor 10 Mg Tabs (Rosuvastatin Calcium) .Marland Kitchen.. 1 Daily 6)  Losartan Potassium 50 Mg Tabs (Losartan Potassium) .Marland Kitchen.. 1 Daily 7)  Coenzyme Q10 100 Mg Caps (Coenzyme Q10) .Marland Kitchen.. 1 Daily 8)  Protonix 40 Mg Tbec (Pantoprazole Sodium) .... One Daily  Allergies (verified): 1)  ! Pcn 2)  ! Biaxin 3)  * Contrast Dye  Past History:  Past Medical History: 1. Coronary artery disease.  Left heart catheterization was done in September 2009 because of exertional chest pain.  It showed an EF of 60% on left ventriculogram, RCA was totally occluded proximally with left-to-right collaterals, the distal left main had a 20% lesion, there was a small ramus with a 60-70% stenosis, and the mid LAD had  serial 60% stenoses with a distal subtotalled LAD.  There was a 50% first diagonal stenosis and a 50% first OM stenosis. There were poor distal targets for bypass  surgery and no good interventional target so medical management was planned. 2. Hypertension. 3. Peptic ulcer disease. 4. Hysterectomy. 5. History of  HBV, HCV 6. History of chronic cough.  ACEI stopped.  Possible contribution from GERD. 7. History of PVCs and PACs.  8. GERD 9. IBS 10. Depression 11. Hyperlipidemia 12. Obesity 13. h/o pancreatitis 14. Osteoporosis 15. Knee osteoarthritis 16. Echo (3/10): EF 55-60%, no regional wall motion abnormalities, aortic sclerosis. 17. PFTs (10/10): FVC 103%, FEV1 109%, ratio 74%.  Mild obstruction.   Family History: Reviewed history from 02/11/2009 and no changes required. Noncontributory.  Social History: Reviewed history from 02/13/2009 and no changes required. The patient does not drink or smoke (never smoked).  She lives with her husband.   Review of Systems       All systems reviewed and negative except as per HPI.   Vital Signs:  Patient profile:   75 year old female Height:      62 inches Weight:      191 pounds BMI:     35.06 Pulse rate:   65 / minute Resp:     16 per minute BP sitting:   122 / 76  (left arm)  Vitals Entered By: Marrion Coy, CNA (July 14, 2009 9:32 AM)   Impression & Recommendations:  Problem # 1:  CAD, NATIVE VESSEL (ICD-414.01) Patient has extensive CAD but no good options  for revascularization.  Her EF is preserved without resting wall motion abnormalities on echo.  She has not had any chest pain.  She will continue Plavix, Toprol XL, Crestor, and losartan.  I would like her to restart ASA 81 mg daily.  She has been off it for 3 months so hopefully her gastric ulcer will have healed.  I will have her take Protonix 40 mg daily with the aspirin for stomach protection.   Problem # 2:  HYPERLIPIDEMIA (ICD-272.4) Lipids at goal on Crestor.    Problem # 3:  HYPERTENSION (ICD-401.9) BP at goal on current meds.   Patient Instructions: 1)  Your physician has recommended you make the following  change in your medication:  2)  Take Aspirin 81mg  every day 3)  Start Protonix 40mg  daily 4)  Your physician recommends that you schedule a follow-up appointment in: 6 months with Dr Freida Busman Devonia Farro---get fasting labs a few days before the appointment with Dr Shirlee Latch  LIPID/LIVER/BMP  5)  414.01 401.9 v58.69 Prescriptions: PROTONIX 40 MG TBEC (PANTOPRAZOLE SODIUM) one daily  #30 x 6   Entered by:   Katina Dung, RN, BSN   Authorized by:   Marca Ancona, MD   Signed by:   Marca Ancona, MD on 07/14/2009   Method used:   [facility]  [id number]       [address]       [place], [place]  [address]       Ph: [phone] or [phone]       Fax: [phone]   RxID:   [id number]

## 2010-07-06 NOTE — Assessment & Plan Note (Signed)
Summary: 2 week follow-up//jrc   Visit Type:  Follow-up Primary Provider/Referring Provider:  Dr. [facility]  CC:  Pt here for follow up. Pt c/o chest congestion and cough better still productive clear to yellow worse at night.  History of Present Illness: 75/F never smoker with CAD for evaluation of reactive airway disease - trigger ? beta blockers ? GERD. 10/29/09 - initial This was of insidious onset  - no clear URI symptoms preceding, seemed to get worse while working in her garden & at night - initially treated for bronchitis with neb inthe office & oral prednisone & antibiotic but cough returned as soon as she completed pred course. A cortisone shot inher left knee seems to have given her the greatest relief. She had an ER visit during a trip to [place]  - echoi nml LV fn, BNP 55, stress myoview nml, given cipro for 'bronchitis.' Review of CXR 08/10/09 shows bibasal interstitial prominence, lt volume loss with raised hemid iaphragm CT angio chest 8/09 was neg for PE, stable RUL 5mm nodule as compared to 05/2005, no obvious fibrosis. CT sinuses 8/11 nml She reports good control of heartburn with protonix , had an EGD by Dr Loreta Ave in 11/10 PFTs in 10/10 were essentially nml, no obstruction, nml lung volumes & diffusion. She had a sleep study at [facility] neurolog[facility]. She is needing cortisone shots for her knee every month. Spirometry6/11  shows nml lung function, RAST neg,  Labs showed neg ANA, and low ESR. RA factor was elevated.   January 18, 2010 2:27 PM  ER visit on 01/09/10 - absolute eos count 800 ,BNP 69, CXR - cardiomegaly, was on prednisone . Diffuse wheezing & coughing today - offered hospital admission but she has grandkids over. Given depot shot   March 01, 2010 10:20 AM  Reviewed Dr Ewell Poe notes - favors OA rather than RA, CCP neg, CK was high 709 - muscle cramps, crestor stopped, rpt CK planned for 9/29. Discussed side efects of steroids with her but explained  that it is needed for bspasm. Stopped advair x 2 weeks - 'ran out', beta blockers stopped Underwent EMG,       Preventive Screening-Counseling & Management  Alcohol-Tobacco     Smoking Status: never  Current Medications (verified): 1)  Plavix 75 Mg Tabs (Clopidogrel Bisulfate) .... Take One Tablet By Mouth Daily 2)  Nitroglycerin 0.4 Mg Subl (Nitroglycerin) .Marland Kitchen.. 1 Tablet Under Tongue As Directed 3)  Aspirin 81 Mg Tabs (Aspirin) .... Once Daily Hold 4)  Losartan Potassium 50 Mg Tabs (Losartan Potassium) .... One Daily 5)  Protonix 40 Mg Tbec (Pantoprazole Sodium) .... Take 1 Tablet By Mouth Once A Day 6)  Align  Caps (Probiotic Product) .... Once Daily 7)  Multivitamins   Tabs (Multiple Vitamin) .... Take 1 Tablet By Mouth Once A Day 8)  Ventolin Hfa 108 (90 Base) Mcg/act Aers (Albuterol Sulfate) .... Inhale 2 Puffs Every Four Hours As Needed 9)  Albuterol Sulfate (2.5 Mg/17ml) 0.083% Nebu (Albuterol Sulfate) .... Four Times A Day As Needed  Allergies (verified): 1)  ! Pcn 2)  ! Biaxin 3)  * Contrast Dye  Past History:  Past Medical History: Last updated: 01/29/2010 1. Coronary artery disease.  Left heart catheterization was done in September 2009 because of exertional chest pain.  It showed an EF of 60% on left ventriculogram, RCA was totally occluded proximally with left-to-right collaterals, the distal left main had a 20% lesion, there was a small ramus with a 60-70% stenosis,  and the mid LAD had  serial 60% stenoses with a distal subtotalled LAD.  There was a 50% first diagonal stenosis and a 50% first OM stenosis. There were poor distal targets for bypass surgery and no good interventional target so medical management was planned. Myoview in [place] in 3/11 was normal per report.  2. Hypertension. 3. Peptic ulcer disease. 4. Hysterectomy. 5. History of  HBV, HCV 6. History of chronic cough.  ACEI stopped.  Possible contribution from GERD. 7. History of PVCs and PACs.  8.  GERD 9. IBS 10. Depression 11. Hyperlipidemia 12. Obesity 13. h/o pancreatitis 14. Osteoporosis 15. Knee osteoarthritis 16. Dyspnea/cough: Echo (3/10): EF 55-60%, no regional wall motion abnormalities, aortic sclerosis.  Echo (3/11) in [place]: EF 60%, grade I diastolic dysfunction, normal RV, no regional wall motion abnormalities, valves are ok.  PFTs (10/10): FVC 103%, FEV1 109%, ratio 74%.  Mild obstruction.  CT chest (6/11) did not show evidence for interstitial lung disease.  Possible asthma or upper airways cough syndrome.  Aspirin and beta blocker have been held as potential causes for bronchospasm.    Social History: Last updated: 10/29/2009 The patient does not drink or smoke (never smoked).   Marital Status:Married lives with husband  Children: Yes Occupation: Retired [employer]  Patient never smoked. (2nd hand smoke exposure x 40 years)  Review of Systems       The patient complains of prolonged cough.  The patient denies anorexia, fever, weight loss, weight gain, vision loss, decreased hearing, hoarseness, chest pain, syncope, dyspnea on exertion, peripheral edema, headaches, hemoptysis, abdominal pain, melena, hematochezia, severe indigestion/heartburn, hematuria, muscle weakness, suspicious skin lesions, difficulty walking, depression, unusual weight change, abnormal bleeding, enlarged lymph nodes, and angioedema.    Vital Signs:  Patient profile:   75 year old female Height:      62.5 inches Weight:      188.25 pounds BMI:     34.01 O2 Sat:      98 % on Room air Temp:     98.1 degrees F oral Pulse rate:   92 / minute BP sitting:   128 / 82  (left arm) Cuff size:   large  Vitals Entered By: Zackery Barefoot CMA (March 01, 2010 10:03 AM)  O2 Flow:  Room air CC: Pt here for follow up. Pt c/o chest congestion, cough better still productive clear to yellow worse at night Comments Medications reviewed with patient Verified contact number and  pharmacy with patient Zackery Barefoot [facility]  March 01, 2010 10:03 AM    Physical Exam  Additional Exam:  wt 188 March 02, 2010  Gen. Pleasant, well-nourished, in no distress, normal affect ENT - no lesions, no post nasal drip Neck: No JVD, no thyromegaly, no carotid bruits Lungs: CTA bialterally w/ no wheezing.  Cardiovascular: Rhythm regular, heart sounds  normal, no murmurs or gallops, no peripheral edema Musculoskeletal: No deformities, no cyanosis or clubbing      Impression & Recommendations:  Problem # 1:  REACTIVE AIRWAY DISEASE (ICD-493.90) Clarified advair- is controller med to be atken every day - regardless of symptoms Ventolin only for rescue as needed  stay on protonix for GERD suppression. Off beta blockade now  Medications Added to Medication List This Visit: 1)  Protonix 40 Mg Tbec (Pantoprazole sodium) .... Take 1 tablet by mouth once a day 2)  Albuterol Sulfate (2.5 Mg/37ml) 0.083% Nebu (Albuterol sulfate) .... Four times a day as needed  Other Orders: Est. Patient Level III (  44010) Prescription Created Electronically [phone])  Patient Instructions: 1)  Get back on advair 250/50  two times a day  2)  Ventolin puffer is to be used as needed ONLY  3)  Copy sent to:Dr Kerby [facility] 4)  Please schedule a follow-up appointment in 2 months with TP Prescriptions: VENTOLIN HFA 108 (90 BASE) MCG/ACT AERS (ALBUTEROL SULFATE) Inhale 2 puffs every four hours as needed  #1 x 3   Entered and Authorized by:   Comer Locket Vassie Loll MD   Signed by:   Comer Locket Vassie Loll MD on 03/01/2010   Method used:   [facility]  [id number]       [address]       [place], [place]  [address]       Ph: [phone] or [phone]       Fax: [phone]   RxID:   [id number] ALBUTEROL SULFATE (2.5 MG/3ML) 0.083% NEBU (ALBUTEROL SULFATE) four times a day as needed  #30 x 0   Entered and Authorized by:   Comer Locket. Vassie Loll  MD   Signed by:   Comer Locket Vassie Loll MD on 03/01/2010   Method used:   [facility]        [facility]  [address] [id number]       [address]       [place], [place]  [address]       Ph: [phone] or [phone]       Fax: [phone]   RxID:   [id number]    Not Administered:    Influenza Vaccine not given due to: declined

## 2010-07-06 NOTE — Assessment & Plan Note (Signed)
Summary: f/u 3 wks ///kp   Visit Type:  3 week followup Primary Provider/Referring Provider:  Dr. [facility]  CC:  cough, came right back after finishing last prednisone, and coughed all weekend.  History of Present Illness: 10/29/09 - initial 74/F never smoker with CAD for evaluation of cough & wheezing x 3-4 months due to reactive airways. This was of insidious onset  - no clear URI symptoms preceding, seemed to get worse while working in her garden & at night - initially treated for bronchitis with neb inthe office & oral prednisone & antibiotic but cough returned as soon as she completed pred course. A cortisone shot inher left knee seems to have given her the greatest relief. She had an ER visit during a trip to [place], reviewed dc summary, was told she had 'congestive heart failure'  - echoi nml LV fn, BNP 55, stress myoview nml, given cipro for 'bronchitis.' Review of CXR 08/10/09 shows bibasal interstitial prominence, lt volume loss with raised hemid iaphragm CT angio chest 8/09 was neg for PE, stable RUL 5mm nodule as compared to 05/2005, no obvious fibrosis. She reports good control of heartburn with protonix , had an EGD by Dr Loreta Ave in 11/10 PFTs in 10/10 were essentially nml, no obstruction, nml lung volumes & diffusion  November 23, 2009 3:44 PM  Cough/ wheezing much improved with oral prednisone - not taking qvar daily since better. She had a sleep study at [facility] [place] neurology. She is needing cortisone shots for her knee every month & would like to proceed with surgery. Spirometry shows nml lung function  January 04, 2010 11:12 AM  Exacerbation while on Toprol  required prednisone20 mg - did not tolerate nevibulol -rash. Developed bspasm again once prednisone tapered. Also c/o bruising over thighs & legs. Worried about her heart if toprol stopped. RAST neg  Preventive Screening-Counseling & Management  Alcohol-Tobacco     Smoking Status: never  Current Medications  (verified): 1)  Plavix 75 Mg Tabs (Clopidogrel Bisulfate) .... Take One Tablet By Mouth Daily 2)  Nitroglycerin 0.4 Mg Subl (Nitroglycerin) .Marland Kitchen.. 1 Tablet Under Tongue As Directed 3)  Toprol Xl 50 Mg Xr24h-Tab (Metoprolol Succinate) .... One and One-Half Daily 4)  Aspirin 81 Mg Tabs (Aspirin) .... Once Daily 5)  Losartan Potassium 25 Mg Tabs (Losartan Potassium) .... Take 1 Tablet By Mouth Two Times A Day 6)  Coenzyme Q10 100 Mg Caps (Coenzyme Q10) .Marland Kitchen.. 1 Daily 7)  Protonix 40 Mg Tbec (Pantoprazole Sodium) .... One Daily 8)  Align  Caps (Probiotic Product) .... Once Daily 9)  Crestor 20 Mg Tabs (Rosuvastatin Calcium) .... One Tablet Daily 10)  Multivitamins   Tabs (Multiple Vitamin) .... Take 1 Tablet By Mouth Once A Day 11)  Nasonex 50 Mcg/act Susp (Mometasone Furoate) .Marland Kitchen.. 1 Spray Each Nostril At Bedtime   Lot # [id number] Exp: Feb 2013 12)  Mucinex Dm 30-600 Mg Xr12h-Tab (Dextromethorphan-Guaifenesin) .... Take 1 Tablet By Mouth Once A Day 13)  Advair Diskus 250-50 Mcg/dose Aepb (Fluticasone-Salmeterol) .Marland Kitchen.. 1 Puff Two Times A Day 14)  Albuterol Inhaler .Marland Kitchen.. 1 Puff As Needed  Allergies (verified): 1)  ! Pcn 2)  ! Biaxin 3)  * Contrast Dye  Past History:  Past Surgical History: Last updated: 10/29/2009 Cholecystectomy Hysterectomy Pancreatitic Duct Stent Tubal Ligation-1995  Social History: Last updated: 10/29/2009 The patient does not drink or smoke (never smoked).   Marital Status:Married lives with husband  Children: Yes Occupation: Retired [employer] social research officer  Patient never  smoked. (2nd hand smoke exposure x 40 years)  Review of Systems       The patient complains of dyspnea on exertion.  The patient denies anorexia, fever, weight loss, weight gain, vision loss, decreased hearing, hoarseness, chest pain, syncope, peripheral edema, prolonged cough, headaches, hemoptysis, abdominal pain, melena, hematochezia, severe indigestion/heartburn, hematuria, muscle weakness,  difficulty walking, depression, unusual weight change, and abnormal bleeding.    Vital Signs:  Patient profile:   75 year old female Height:      62.5 inches Weight:      187.50 pounds BMI:     33.87 O2 Sat:      97 % on Room air Pulse rate:   61 / minute BP sitting:   120 / 60  (right arm) Cuff size:   large  Vitals Entered By: Kandice Hams CMA (January 04, 2010 10:43 AM)  O2 Flow:  Room air CC: cough, came right back after finishing last prednisone, coughed all weekend   Physical Exam  Additional Exam:  Gen. Pleasant, well-nourished, in no distress, normal affect ENT - no lesions, no post nasal drip Neck: No JVD, no thyromegaly, no carotid bruits Lungs: no use of accessory muscles, no dullness to percussion, no rales , diffuse rhonchi  Cardiovascular: Rhythm regular, heart sounds  normal, no murmurs or gallops, no peripheral edema Musculoskeletal: No deformities, no cyanosis or clubbing      Impression & Recommendations:  Problem # 1:  REACTIVE AIRWAY DISEASE (ICD-493.90) Unclear trigger -  RAST not helpful Trial of singulair Ct  advair 250/50 two times a day  - RINSE mouth , dc qvar Take albuterol MDI as needed for shohrtness of  breeath Try to avoid prednisone. D/w Dr Shirlee Latch  if Toprol can be stopped - if bspasm improves , may consider re-challenge in 1 month Will have to hold off surgery until better control of this problem  Problem # 2:  HEPATITIS C (ICD-070.51)  Bruising more likely related to plavix & prednisone - doub have to consider vasculitis here.  Orders: Est. Patient Level IV ([address])  Medications Added to Medication List This Visit: 1)  Advair Diskus 250-50 Mcg/dose Aepb (Fluticasone-salmeterol) .Marland Kitchen.. 1 puff two times a day 2)  Albuterol Inhaler  .Marland Kitchen.. 1 puff as needed  Patient Instructions: 1)  Copy sent to: Dr Shirlee Latch, Dr Jarold Motto 2)  Please schedule a follow-up appointment in 1 month. 3)  Singulair (samples) qd x 2 weeks 4)  I will talk to Dr  Shirlee Latch about stopping toprol 5)  Stay on advair 250/50 two times a day  - I will send Rx 6)  If worse,call us - you may need prednisone  Appended Document: f/u 3 wks ///kp She should stop Toprol.  No beta blocker for now as this appears to be causing her bronchospasm.   Appended Document: f/u 3 wks ///kp The pt is aware to hold her toprol for now. She will record her bp readings and call our office should she notice they are going up. She voices understanding.

## 2010-07-06 NOTE — Assessment & Plan Note (Signed)
Primary Provider:  Jarome Matin  CC:  No complains.  History of Present Illness: 75 yo with h/o HTN and CAD presents to cardiology for followup.  Pt had left heart cath in 9/09 showing a totally occluded RCA and a subtotally occluded distal LAD.  EF was preserved.  There was no good target for intervention and poor targets for CABG.  We have been trying to optimize the patient's medical management. Pt states that she has not had any significant exertional CP since I last saw her.  She has not been very active due to her arthritis.  She gets mildly short of breath with moderate to heavy yardwork like raking.  Pt's main complaint is arthritic pain in her knees and back.  She also continues to have GERD-type symptoms.    Labs (12/09):  LDL 73, HDL 40  ECG:  NSR, old inferior MI  Current Medications (verified): 1)  Lipitor 40 Mg Tabs (Atorvastatin Calcium) .... Take 1 Tablet By Mouth Once A Day 2)  Lisinopril 10 Mg Tabs (Lisinopril) .... Take 1 Tablet By Mouth Once A Day 3)  Plavix 75 Mg Tabs (Clopidogrel Bisulfate) .... Take One Tablet By Mouth Daily 4)  Metoprolol Succinate 50 Mg Xr24h-Tab (Metoprolol Succinate) .... Take 1 Tablet By Mouth Once A Day 5)  Align  Caps (Misc Intestinal Flora Regulat) .... Take 1 Capsule By Mouth Once A Day  Allergies: 1)  ! Pcn 2)  ! Asa 3)  ! Biaxin  Past History:  Past Medical History:    1. Coronary artery disease.  Left heart catheterization was done in        September 2009 because of exertional chest pain.  It showed an EF        of 60% on left ventriculogram, RCA was totally occluded proximally        with left-to-right collaterals, the distal left main had a 20%        lesion, there was a small ramus with a 60-70% stenosis, and the mid        LAD had  serial 60% stenoses with a distal subtotalled LAD.  There        was a 50% first diagonal stenosis and a 50% first OM stenosis.        There were poor distal targets for bypass surgery and  medical        management was planned.    2. Hypertension.    3. Peptic ulcer disease.    4. Hysterectomy.    5. History of  HBV, HCV    6. History of chronic cough, possibly due to gastroesophageal reflux        disease.    7. History of PVCs and PACs.     8. GERD    9. IBS    10. Depression    11. Hyperlipidemia    12. Obesity    13. h/o pancreatitis    14. Osteoporosis    15. Knee osteoarthritis  Family History:    Reviewed history from 05/19/2008 and no changes required:       Noncontributory.  Social History:    Reviewed history from 05/19/2008 and no changes required:       The patient does not drink or smoke.  She lives with       her husband.   Review of Systems       Negative except as per HPI  Vital Signs:  Patient profile:   75  year old female Height:      62 inches Weight:      188.25 pounds BMI:     34.56 Pulse rate:   68 / minute Pulse rhythm:   regular Resp:     18 per minute BP sitting:   138 / 92  (left arm) Cuff size:   large  Physical Exam  General:  Well developed, well nourished, in no acute distress. Mildly obese.  Head:  normocephalic and atraumatic Nose:  no deformity, discharge, inflammation, or lesions Mouth:  Teeth, gums and palate normal. Oral mucosa normal. Neck:  Neck supple, no JVD. No masses, thyromegaly or abnormal cervical nodes. Lungs:  Clear bilaterally to auscultation and percussion.  Normal respiratory effort.  Heart:  Non-displaced PMI, chest non-tender; regular rate and rhythm, S1, S2, +S4, 2/6 early systolic murmur RUSB. Carotid upstroke normal, no bruit. No edema, no varicosities. Abdomen:  Bowel sounds positive; abdomen soft and non-tender without masses, organomegaly, or hernias noted. No hepatosplenomegaly.  Mildly obese.  Msk:  Back normal, normal gait. Muscle strength and tone normal. Extremities:  No clubbing or cyanosis. Neurologic:  Alert and oriented x 3. Skin:  Intact without lesions or rashes. Psych:  Normal  affect.   Impression & Recommendations:  Problem # 1:  CAD, NATIVE VESSEL (ICD-414.01) Significant native vessel CAD managed medically due to lack of targets for CABG and lack of options for PCI.  She is doing well symptomatically with no anginal pain or dyspnea.  She has stopped taking ASA 81 mg daily, not sure why.  She has a history of possible anaphylaxis with ASA 325 but had taken ASA 81 mg daily with no problems for a long period of time.  She should resume ASA 81 mg daily.  She will continue Plavix, Toprol XL, and statin.  I will increase Toprol XL to 75 mg daily.   Problem # 2:  KNEE PAIN (ICD-719.46) Pt has osteoarthritis and is followed by orthopedics.  She thinks that she may eventually need a knee replacement.  For pain control, tramadol and Tylenol would  be ok.  I would avoid NSAIDs given her HTN and CAD.  I have given her a prescription for tramadol.  If she needs surgery, she is at higher risk given CAD but she is on a stable medical regimen and minimally symptomatic, so I think it would be ok if needed with no further testing.  She can stop Plavix for a surgery but should stay on ASA perioperatively.   Problem # 3:  HYPERLIPIDEMIA (ICD-272.4) We will repeat LFTs, lipids in 2 wks fasting.  Goal LDL < 70.   Problem # 4:  HYPERTENSION (ICD-401.9) I am going to increase lisinopril to 20 mg daily and Toprol XL to 75 mg daily.  She will f/u in 2 wks for BP check and BMP.   Problem # 5:  GERD (ICD-530.81) If she needs a PPI, Protonix would be the best choice since she is on Plavix.   Problem # 6:  MURMUR (ICD-785.2) Pt has an aortic-type murmur.  We will reassess this with echocardiogram.    Followup 6 mos

## 2010-07-06 NOTE — Consult Note (Signed)
Summary: [facility]  [facility]   Imported By: Esmeralda Links D'jimraou 06/26/2007 13:05:31  _____________________________________________________________________  External Attachment:    Type:   Image     Comment:   External Document

## 2010-07-06 NOTE — Miscellaneous (Signed)
Summary: Orders Update  Clinical Lists Changes  Problems: Added new problem of SHORTNESS OF BREATH (ICD-786.05) Orders: Added new Test order of T-2 View CXR (71020TC) - Signed 

## 2010-07-06 NOTE — Medication Information (Signed)
Summary: [facility]  [facility]   Imported By: Lester Aullville 03/18/2010 08:37:13  _____________________________________________________________________  External Attachment:    Type:   Image     Comment:   External Document

## 2010-07-08 NOTE — Assessment & Plan Note (Signed)
Summary: 4 month rov/sl   Visit Type:  Follow-up Primary Provider:  Dr. [facility]   History of Present Illness: 75 yo with history of CAD, asthma, and osteoarthritis presents for cardiology followup.  Patient had a long workup for bronchospasm with Dr. Vassie Loll.  It now appears that she is very sensitive to beta blockers, with bronchospasm/dyspnea with even the most beta-1 selective agents.  She is, therefore, no longer on a beta blocker.  Her aspirin was also stopped due to concern that it could have been contrbuting to bronchospasm.  She saw Dr. Dareen Piano for workup of her knee, hip, and shoulder pain.  He found her CPK to be elevated and stopped Crestor.  The pain seems to have improved off Crestor.  Currently, she is doing symptomatically well except for her knee pain. She can walk without exertional dyspnea.  She can climb a flight of steps without dyspnea, though her knee will hurt.  She is planned for knee replacement in the near future.   ECG: NSR, old inferior infarct  Labs (6/11): LDL 101, HDL 53 Labs (9/11): K 4.6, creatinine 0.9  Current Medications (verified): 1)  Plavix 75 Mg Tabs (Clopidogrel Bisulfate) .... Take One Tablet By Mouth Daily 2)  Nitroglycerin 0.4 Mg Subl (Nitroglycerin) .Marland Kitchen.. 1 Tablet Under Tongue As Directed 3)  Losartan Potassium 50 Mg Tabs (Losartan Potassium) .... One Daily 4)  Protonix 40 Mg Tbec (Pantoprazole Sodium) .... Take 1 Tablet By Mouth Once A Day 5)  Ventolin Hfa 108 (90 Base) Mcg/act Aers (Albuterol Sulfate) .... Inhale 2 Puffs Every Four Hours As Needed 6)  Albuterol Sulfate (2.5 Mg/38ml) 0.083% Nebu (Albuterol Sulfate) .... Four Times A Day As Needed  Allergies (verified): 1)  ! Pcn 2)  ! Biaxin 3)  * Contrast Dye  Past History:  Past Medical History: Reviewed history from 01/29/2010 and no changes required. 1. Coronary artery disease.  Left heart catheterization was done in September 2009 because of exertional chest pain.  It  showed an EF of 60% on left ventriculogram, RCA was totally occluded proximally with left-to-right collaterals, the distal left main had a 20% lesion, there was a small ramus with a 60-70% stenosis, and the mid LAD had  serial 60% stenoses with a distal subtotalled LAD.  There was a 50% first diagonal stenosis and a 50% first OM stenosis. There were poor distal targets for bypass surgery and no good interventional target so medical management was planned. Myoview in [place] in 3/11 was normal per report.  2. Hypertension. 3. Peptic ulcer disease. 4. Hysterectomy. 5. History of  HBV, HCV 6. History of chronic cough.  ACEI stopped.  Possible contribution from GERD. 7. History of PVCs and PACs.  8. GERD 9. IBS 10. Depression 11. Hyperlipidemia 12. Obesity 13. h/o pancreatitis 14. Osteoporosis 15. Knee osteoarthritis 16. Dyspnea/cough: Echo (3/10): EF 55-60%, no regional wall motion abnormalities, aortic sclerosis.  Echo (3/11) in [place]: EF 60%, grade I diastolic dysfunction, normal RV, no regional wall motion abnormalities, valves are ok.  PFTs (10/10): FVC 103%, FEV1 109%, ratio 74%.  Mild obstruction.  CT chest (6/11) did not show evidence for interstitial lung disease.  Possible asthma or upper airways cough syndrome.  Aspirin and beta blocker have been held as potential causes for bronchospasm.    Family History: Reviewed history from 10/29/2009 and no changes required. Family History Asthma-sister, mother Family History Emphysema-sister Family History Rheumatoid Arthritis-mother Family History Colon Cancer-maternal aunt Family History Lung Cancer-maternal aunt Pancreatic Cancer-brother Family  History C V A / Stroke -mother  Social History: Reviewed history from 10/29/2009 and no changes required. The patient does not drink or smoke (never smoked).   Marital Status:Married lives with husband  Children: Yes Occupation: Retired [employer]  Patient never smoked.  (2nd hand smoke exposure x 40 years)  Review of Systems       All systems reviewed and negative except as per HPI.   Vital Signs:  Patient profile:   75 year old female Weight:      186 pounds BMI:     34.70 Pulse rate:   78 / minute BP sitting:   132 / 84  (left arm) Cuff size:   regular  Vitals Entered By: Hardin Negus, RMA (May 19, 2010 11:51 AM)  Physical Exam  General:  Well developed, well nourished, in no acute distress.  Obese.  Neck:  Neck supple, no JVD. No masses, thyromegaly or abnormal cervical nodes. Lungs:  Clear bilaterally to auscultation and percussion. Heart:  Non-displaced PMI, chest non-tender; regular rate and rhythm, S1, S2 without rubs or gallops. 2/6 early systolic ejection murmur at RUSB.  Carotid upstroke normal, no bruit.  Pedals normal pulses. No edema.  Abdomen:  Bowel sounds positive; abdomen soft and non-tender without masses, organomegaly, or hernias noted. No hepatosplenomegaly. Extremities:  No clubbing or cyanosis. Neurologic:  Alert and oriented x 3. Psych:  Normal affect.   Impression & Recommendations:  Problem # 1:  REACTIVE AIRWAY DISEASE (ICD-493.90) Patient has a form of asthma that seems to be very sensitive to beta blockers, even highly beta-1 selective agents like bisoprolol and nebivolol. Therefore, she is off all beta blockers.   She is also off aspirin due to concern that it may but contributing to bronchospasm.  She has been breathing well recently.   Problem # 2:  CAD, NATIVE VESSEL (ICD-414.01) Patient has extensive CAD but no good options for revascularization.  Her EF was preserved without resting wall motion abnormalities on recent echo in [place].  Myoview in [place] showed no ischemia.  She has not had any chest pain and breathing is much better off beta blockers.  She will continue Plavix and losartan.  I think that it is very important to get her back on a statin.  I will try her on pravastatin 40 mg daily.   This tends to have the best-tolerated side effect profile.   I will get a CPK in 1 month and lipids/LFTs in 2 months.    Problem # 3:  PREOPERATIVE EVALUATON No further testing required prior to knee surgery given lack of cardiac symptoms and reasonable exercise tolerance.  She will be at higher risk given her extensive CAD.  Unfortunately, it looks like she will not be able to tolerate beta blockers.   She should start pravastatin before surgery.    Other Orders: EKG w/ Interpretation (93000)  Patient Instructions: 1)  Your physician has recommended you make the following change in your medication:  2)  Start  Pravastatin 40mg  daily in the evening. 3)  Lab in 1 month--CK 414.01 272.0 4)  Your physician recommends that you return for a FASTING lipid profile/liver profile in 2 months--414.01 272.0  5)  Your physician recommends that you schedule a follow-up appointment in: 3 months with Dr Shirlee Latch. Prescriptions: PRAVASTATIN SODIUM 40 MG TABS (PRAVASTATIN SODIUM) one daily in the evening  #30 x 3   Entered by:   Katina Dung, RN, BSN   Authorized by:  Marca Ancona, MD   Signed by:   Katina Dung, RN, BSN on 05/19/2010   Method used:   Electronically to        [facility]  [address] [id number] (ret[facility])       [address]       [place], [place]  [address]       Ph: [phone] or [phone]       Fax: [phone]   RxID:   [id number]

## 2010-07-08 NOTE — Progress Notes (Signed)
Summary: pain right arm chest and sob.   RS  Phone Note Call from Patient Call back at Home Phone [phone]   Caller: Patient Reason for Call: Talk to Nurse Summary of Call: pt states right arm hurts and having chest pain and little of sob. pt states one of her meds is giving her a rash. Initial call taken by: Roe Coombs,  May 25, 2010 9:32 AM  Follow-up for Phone Call        pt states that she is having pain in shoulder blade down r arm down to hand and she has had this before.  She states that 8 years ago they gave her a shot that helped.  Looked up Dr. Ewell Poe [phone], and provided it to the pt per her request.  Pt also stated that the Pravastatin, the new med prescribed, is making her itch all over and making her face burn.  Adv pt to stop taking med and recommended taking Benadryl.  Pt stated that she was told not to take anything unless Dr. Shirlee Latch approves. Adv pt will await recommendation from Dr. Shirlee Latch and then adv her.  Follow-up by: Claris Gladden RN,  May 25, 2010 9:54 AM  Additional Follow-up for Phone Call Additional follow up Details #1::        per Dr. Shirlee Latch, he wants pt to be sure that Pravastatin was the cause of the itching and is ok for pt to take Benadryl. pt dtr adv pt on way home and to cb in about 20 mins.  Additional Follow-up by: Claris Gladden RN,  May 25, 2010 4:30 PM    Additional Follow-up for Phone Call Additional follow up Details #2::    spoke w/pt on cell phone, [phone], and she is certain that the Pravastatin is causing her itching.  She states she did not itch until she took that medication. We discussed how important a statin is and that there may be another one she can tolerate and Dr. Shirlee Latch can review those options for her. Adv pt that Dr. Shirlee Latch did approve Benadryl for use to help with the itching. Pt expressed understanding.  Follow-up by: Claris Gladden RN,  May 25, 2010 4:59 PM   Appended Document:  pain right arm chest and sob.   RS I talked with pt--she continues to have itching and feeling hot on her face--she states she did not stop Pravastatin but does think that is the cause of her itching--pt will stop Pravastatin and use Benadryl as needed-she will call me back in 10 days or sooner if her symptoms have not completely resolved--Dr Shirlee Latch is aware that she will stay off Pravastatin for now and will make recommendations about further medication for cholesterol after pt calls in 10 days   Clinical Lists Changes  Allergies: Added new allergy or adverse reaction of * PRAVASTATIN Observations: Added new observation of MEDRECON: current updated (05/26/2010 18:48) Added new observation of ALLERGY REV: Done (05/26/2010 18:48)       Current Medications (verified): 1)  Plavix 75 Mg Tabs (Clopidogrel Bisulfate) .... Take One Tablet By Mouth Daily 2)  Nitroglycerin 0.4 Mg Subl (Nitroglycerin) .Marland Kitchen.. 1 Tablet Under Tongue As Directed 3)  Losartan Potassium 50 Mg Tabs (Losartan Potassium) .... One Daily 4)  Protonix 40 Mg Tbec (Pantoprazole Sodium) .... Take 1 Tablet By Mouth Once A Day 5)  Ventolin Hfa 108 (90 Base) Mcg/act Aers (Albuterol Sulfate) .... Inhale 2 Puffs Every Four Hours As Needed 6)  Albuterol Sulfate (2.5 Mg/39ml)  0.083% Nebu (Albuterol Sulfate) .... Four Times A Day As Needed  Allergies: 1)  ! Pcn 2)  ! Biaxin 3)  ! * Pravastatin 4)  * Contrast Dye

## 2010-07-08 NOTE — Progress Notes (Signed)
Summary: re okay for a shot  Phone Note Call from Patient   Reason for Call: Talk to Nurse, Talk to Doctor Summary of Call: pt needs [facility] called to ok her to get a [phone] says they sent a fax last week and haven't gotten a response Initial call taken by: Glynda Jaeger,  June 08, 2010 2:27 PM  Follow-up for Phone Call        Pt calls b/c she is having a shoulder injection at [facility] on Friday 06/11/10 at 9:15am.  She needs a clearance letter faxed to them at [phone].  She stopped her Plavix 06/04/10 for this procedure. She needs the letter faxed asap and is aware Dr. Shirlee Latch is out of the office today. I will forward to Dr. Shirlee Latch and his nurse, Thurston Hole. Mylo Red RN     Appended Document: re okay for a shot OK for her to get the shoulder injection. Restart Plavix afterwards.   Appended Document: re okay for a shot pt notified this is OK --form faxed to [facility] at [phone] , [phone] and [phone]  Appended Document: re okay for a shot [phone] phone not fax--original to medical records to be scanned into EMR

## 2010-07-08 NOTE — Progress Notes (Signed)
Summary: Records Request  Faxed Stress and Echo to [facility] ([phone]). Debby Freiberg  June 24, 2010 2:28 PM

## 2010-07-09 NOTE — Consult Note (Signed)
Summary: [facility]  [facility]   Imported By: Lester Sulligent 11/05/2008 09:31:51  _____________________________________________________________________  External Attachment:    Type:   Image     Comment:   External Document

## 2010-07-12 ENCOUNTER — Telehealth: Payer: Self-pay | Admitting: Cardiology

## 2010-07-15 ENCOUNTER — Other Ambulatory Visit: Payer: Self-pay

## 2010-07-20 NOTE — H&P (Signed)
  NAMEBIANNEY, ROCKWOOD              ACCOUNT NO.:  [id number]  MEDICAL RECORD NO.:  [id number]           PATIENT TYPE:  LOCATION:                                 FACILITY:  PHYSICIAN:  Almedia Balls. Ranell Patrick, M.D. DATE OF BIRTH:  Dec 09, 1935  DATE OF ADMISSION: DATE OF DISCHARGE:                             HISTORY & PHYSICAL   Ms. Hedeen is a 75 year old female with a chief complaint of left knee pain.  HISTORY OF PRESENT ILLNESS:  The patient is a 75 year old female with worsening left knee pain secondary to end-stage osteoarthritis.  The patient has elected have a left total knee arthroplasty by Dr. Malon Kindle to decrease pain and increase function.  PAST MEDICAL HISTORY:  Hypertension, hyperlipidemia.  FAMILY HISTORY:  Negative.  SOCIAL HISTORY:  Patient of Dr. Jarome Matin.  Does not smoke or use alcohol.  ALLERGIES:  ASPIRIN, PENICILLIN, and PRAVASTATIN.  CURRENT MEDICATIONS:  Losartan with unknown dose.  REVIEW OF SYSTEMS:  She has pain with range of motion of the left knee, also some mild lumbar radiculopathy on the left side.  PHYSICAL EXAMINATION:  VITAL SIGNS:  Pulse 80, respirations 16, blood pressure 138/82. GENERAL:  The patient is a healthy-appearing 75 year old female adequately in no distress here in the office today, alert and oriented x3. HEAD AND NECK:  Cranial nerves II through XII grossly intact.  Neck shows full range of motion without any tenderness. CHEST:  Active breath sounds bilaterally with no wheezes, rhonchi, or rales. HEART:  Regular rate and rhythm.  No murmur. ABDOMEN:  Nontender, nondistended with active bowel sounds. EXTREMITIES:  Left knee shows moderate medial joint tenderness with mild effusion.  No positive mass.  Calves nontender.  She does have an antalgic gait, minimal pedal edema distally.  No rashes.  X-rays show end-stage osteoarthritis of left knee.  IMPRESSION:  End-stage osteoarthritis, left knee.  PLAN OF ACTION:   Left total knee arthroplasty by Dr. Malon Kindle.     Thomas B. Dixon, P.A.   ______________________________ Almedia Balls. Ranell Patrick, M.D.    TBD/MEDQ  D:  06/22/2010  T:  06/23/2010  Job:  [id number]  Electronically Signed by Standley Dakins P.A. on 06/29/2010 09:55:19 AM Electronically Signed by Malon Kindle  on 07/20/2010 11:25:29 AM

## 2010-07-20 NOTE — Discharge Summary (Signed)
  [name] [name], [name] [name]              ACCOUNT NO.:  [id number]  MEDICAL RECORD NO.:  [id number]          PATIENT TYPE:  INP  LOCATION:  5031                         FACILITY:  [facility]  PHYSICIAN:  Almedia Balls. Ranell Patrick, M.D. DATE OF BIRTH:  [birth year]  DATE OF ADMISSION:  06/25/2010 DATE OF DISCHARGE:                              DISCHARGE SUMMARY   ADMITTING DIAGNOSIS:  Left knee end-stage osteoarthritis.  DISCHARGE DIAGNOSIS:  Left knee end-stage osteoarthritis.  BRIEF HISTORY:  The patient is a 75 year old female with worsening left knee pain secondary to end-stage osteoarthritis.  The patient elected to have left total knee arthroplasty by Dr. Malon Kindle, decreased pain and increase function.  PROCEDURE:  The patient had a left total knee arthroplasty by Dr. Malon Kindle on June 25, 2010.  Assistant was [employer].  General anesthesia was used.  No complications.  HOSPITAL COURSE:  The patient admitted on June 25, 2010 for the above- stated procedure, which he tolerated well.  After adequate time in Post Anesthesia Care Unit, she was transferred up to 5000.  Postop day #1, the patient complained of moderate pain to the left knee but was able work with physical therapy and occupational therapy.  Postop day #2, she had a dressing change, it was noted she had a small either skin tear versus a burn, blister to the lateral aspect of the midline incision. Neurovascularly, otherwise she is intact.  She had good range of motion. Did have some mild calf tenderness.  Thus we did a Doppler scan which was negative.  No signs of DVT and really no signs cellulitis, but we went ahead and started treatment on vancomycin on postop day #3 to assure that no infection would develop or continue.  The patient continued to improve, was feeling much better on date of discharge.  The patient to be discharged to a skilled nursing facility once the bed is available.  DISCHARGE PLAN:  The  patient will be discharged to skilled nursing facility.  ACTIVITY:  Weightbearing as tolerated with the use of walker.  FOLLOWUP:  The patient follow back with Dr. Malon Kindle 7-10 days.  DISCHARGE MEDICATIONS: 1. Cozaar 50 mg daily. 2. Robaxin 500 mg p.o. q.6 h. 3. Protonix 40 mg b.i.d. 4. Xarelto 10 mg daily. 5. Vancomycin 750 mg IV q.12 h. for 10 days. 6. Norco 5/325 1-2 tablets q.4-6 h. p.r.n. pain.  CONDITION:  Stable.  DIET:  Regular.     Thomas B. Dixon, P.A.   ______________________________ Almedia Balls. Ranell Patrick, M.D.    TBD/MEDQ  D:  06/29/2010  T:  06/29/2010  Job:  [id number]  Electronically Signed by Standley Dakins P.A. on 06/29/2010 09:55:22 AM Electronically Signed by Malon Kindle  on 07/20/2010 11:25:27 AM

## 2010-07-22 NOTE — Progress Notes (Signed)
Summary: WHAT CAN HE TAKE FOR PAIN IN KNEE  Phone Note Call from Patient   Caller: Patient [phone] Reason for Call: Talk to Nurse Summary of Call: pain in knee-what can she take? Initial call taken by: Glynda Jaeger,  July 12, 2010 10:46 AM  Follow-up for Phone Call        Pt. is pot-knee surgery. Pt. states her orthopedic surgeon has given only Tylenol for pain and it, does not help the pain. I let pt. now she needs to call her surgeon back to see what other medication he recommends for pain. Pt. verbalized understanding. Follow-up by: Ollen Gross, RN, BSN,  July 12, 2010 12:39 PM

## 2010-07-26 ENCOUNTER — Telehealth: Payer: Self-pay | Admitting: Cardiology

## 2010-08-03 NOTE — Progress Notes (Signed)
Summary: pt has question re med/numbness in left hand  Phone Note Call from Patient   Caller: Patient [phone] Reason for Call: Talk to Nurse Summary of Call: question re med/and numbness in left hand x 2 week Initial call taken by: Glynda Jaeger,  July 26, 2010 10:58 AM  Follow-up for Phone Call        I talked with pt--pt states  she has had numbness in 2 fingers of her left hand for about 1 week--pt denies chest pain,tightness, SOB--pt had knee surgery recently--while in rehab she states she had numbness in the finger of her right hand--pt states she was not told the cause of the numbness in her right hand while  in rehab--pt denies any vision,speech problems,headache, or facial drooping-- pt also notes rash  on both legs that she states started after her knee surgery, she states this comes and goes--pt will followup with her PCP today--I reviewed this with Dr Shirlee Latch

## 2010-08-11 ENCOUNTER — Encounter: Payer: Self-pay | Admitting: Cardiology

## 2010-08-17 NOTE — Letter (Signed)
Summary: 06/02/10- [facility]   [facility]   Imported By: Kassie Mends 08/11/2010 09:54:00  _____________________________________________________________________  External Attachment:    Type:   Image     Comment:   External Document

## 2010-08-18 ENCOUNTER — Encounter: Payer: Self-pay | Admitting: Cardiology

## 2010-08-18 ENCOUNTER — Ambulatory Visit (INDEPENDENT_AMBULATORY_CARE_PROVIDER_SITE_OTHER): Payer: Self-pay | Admitting: Cardiology

## 2010-08-18 DIAGNOSIS — I2510 Atherosclerotic heart disease of native coronary artery without angina pectoris: Secondary | ICD-10-CM

## 2010-08-20 LAB — BASIC METABOLIC PANEL
BUN: 14 mg/dL (ref 6–23)
CO2: 24 mEq/L (ref 19–32)
Calcium: 9.4 mg/dL (ref 8.4–10.5)
Chloride: 112 mEq/L (ref 96–112)
Creatinine, Ser: 0.94 mg/dL (ref 0.4–1.2)
GFR calc Af Amer: >60 mL/min (ref >60)
GFR calc non Af Amer: 59 mL/min — ABNORMAL LOW (ref >60)
Glucose, Bld: 115 mg/dL — ABNORMAL HIGH (ref 70–99)
Potassium: 3.6 mEq/L (ref 3.5–5.1)
Sodium: 144 mEq/L (ref 135–145)

## 2010-08-20 LAB — DIFFERENTIAL
Basophils Absolute: 0 10*3/uL (ref 0.0–0.1)
Basophils Relative: 0 % (ref 0–1)
Eosinophils Absolute: 0.8 10*3/uL — ABNORMAL HIGH (ref 0.0–0.7)
Eosinophils Relative: 13 % — ABNORMAL HIGH (ref 0–5)
Lymphocytes Relative: 31 % (ref 12–46)
Lymphs Abs: 2.1 10*3/uL (ref 0.7–4.0)
Monocytes Absolute: 0.7 10*3/uL (ref 0.1–1.0)
Monocytes Relative: 11 % (ref 3–12)
Neutro Abs: 3 10*3/uL (ref 1.7–7.7)
Neutrophils Relative %: 45 % (ref 43–77)

## 2010-08-20 LAB — POCT CARDIAC MARKERS
CKMB, poc: 2.2 ng/mL (ref 1.0–8.0)
Myoglobin, poc: 213 ng/mL (ref 12–200)
Troponin i, poc: <0.05 ng/mL (ref 0.00–0.09)

## 2010-08-20 LAB — CBC
HCT: 38.4 % (ref 36.0–46.0)
Hemoglobin: 13 g/dL (ref 12.0–15.0)
MCH: 30.5 pg (ref 26.0–34.0)
MCHC: 33.9 g/dL (ref 30.0–36.0)
MCV: 90.1 fL (ref 78.0–100.0)
Platelets: 164 10*3/uL (ref 150–400)
RBC: 4.26 MIL/uL (ref 3.87–5.11)
RDW: 12.9 % (ref 11.5–15.5)
WBC: 6.7 10*3/uL (ref 4.0–10.5)

## 2010-08-20 LAB — BRAIN NATRIURETIC PEPTIDE: Pro B Natriuretic peptide (BNP): 69 pg/mL (ref 0.0–100.0)

## 2010-08-24 NOTE — Assessment & Plan Note (Signed)
Summary: F3M/FROM CHECKOUT 05/17/10/[facility]   Primary Provider:  Dr. [facility]  CC:  BP up.  PT has not taken medication today.  She has  no cardiac complaints only knee pain from replacement.  History of Present Illness: 75 yo with history of CAD, asthma, and osteoarthritis presents for cardiology followup.  Patient had a long workup for bronchospasm with Dr. Vassie Loll.  It now appears that she is very sensitive to beta blockers, with bronchospasm/dyspnea with even the most beta-1 selective agents.  She is, therefore, no longer on a beta blocker.  Her aspirin was also stopped due to concern that it could have been contrbuting to bronchospasm.  Crestor was stopped because of myalgias and increased CK.  She thinks she had an allergic reaction to pravastatin with a rash so she stopped it.   Since I last saw her, patient had left TKR.  Her main complaint today is continued severe pain in the left knee unrelieved by pain meds.  She is walking with a walker.  She is breathing fine with no exertional dyspnea, though walking is very limited by her knee pain.  No chest pain.  SBP running in the 120s at home.   Labs (6/11): LDL 101, HDL 53 Labs (9/11): K 4.6, creatinine 0.9  Current Medications (verified): 1)  Plavix 75 Mg Tabs (Clopidogrel Bisulfate) .... Take One Tablet By Mouth Daily 2)  Nitroglycerin 0.4 Mg Subl (Nitroglycerin) .Marland Kitchen.. 1 Tablet Under Tongue As Directed 3)  Losartan Potassium 50 Mg Tabs (Losartan Potassium) .... One Daily 4)  Protonix 40 Mg Tbec (Pantoprazole Sodium) .... Take 1 Tablet By Mouth Once A Day 5)  Ventolin Hfa 108 (90 Base) Mcg/act Aers (Albuterol Sulfate) .... Inhale 2 Puffs Every Four Hours As Needed 6)  Albuterol Sulfate (2.5 Mg/78ml) 0.083% Nebu (Albuterol Sulfate) .... Four Times A Day As Needed 7)  Hydrocodone-Acetaminophen 5-325 Mg Tabs (Hydrocodone-Acetaminophen) .... Take One To Two Tablets Every 4-6 Hrs As Needed For Pain  Allergies (verified): 1)  !  Pcn 2)  ! Biaxin 3)  ! * Pravastatin 4)  * Contrast Dye  Past History:  Past Medical History: 1. Coronary artery disease.  Left heart catheterization was done in September 2009 because of exertional chest pain.  It showed an EF of 60% on left ventriculogram, RCA was totally occluded proximally with left-to-right collaterals, the distal left main had a 20% lesion, there was a small ramus with a 60-70% stenosis, and the mid LAD had  serial 60% stenoses with a distal subtotalled LAD.  There was a 50% first diagonal stenosis and a 50% first OM stenosis. There were poor distal targets for bypass surgery and no good interventional target so medical management was planned. Myoview in [place] in 3/11 was normal per report.  2. Hypertension. 3. Peptic ulcer disease. 4. Hysterectomy. 5. History of  HBV, HCV 6. History of chronic cough.  ACEI stopped.  Possible contribution from GERD. 7. History of PVCs and PACs. 8. GERD 9. IBS 10. Depression 11. Hyperlipidemia: Unable to tolerate Crestor due to myalgias and elevated CK.  She thinks pravastatin caused a rash.  12. Obesity 13. h/o pancreatitis 14. Osteoporosis 15. Knee osteoarthritis s/p left TKR.  16. Dyspnea/cough: Echo (3/10): EF 55-60%, no regional wall motion abnormalities, aortic sclerosis.  Echo (3/11) in [place]: EF 60%, grade I diastolic dysfunction, normal RV, no regional wall motion abnormalities, valves are ok.  PFTs (10/10): FVC 103%, FEV1 109%, ratio 74%.  Mild obstruction.  CT chest (6/11) did  not show evidence for interstitial lung disease.  Possible asthma or upper airways cough syndrome.  Aspirin and beta blocker have been held as potential causes for bronchospasm.    Family History: Reviewed history from 10/29/2009 and no changes required. Family History Asthma-sister, mother Family History Emphysema-sister Family History Rheumatoid Arthritis-mother Family History Colon Cancer-maternal aunt Family History Lung  Cancer-maternal aunt Pancreatic Cancer-brother Family History C V A / Stroke -mother  Social History: Reviewed history from 10/29/2009 and no changes required. The patient does not drink or smoke (never smoked).   Marital Status:Married lives with husband  Children: Yes Occupation: Retired [employer]  Patient never smoked. (2nd hand smoke exposure x 40 years)  Review of Systems       All systems reviewed and negative except as per HPI.   Vital Signs:  Patient profile:   75 year old female Height:      61.5 inches Weight:      171 pounds BMI:     31.90 Pulse rate:   86 / minute Pulse rhythm:   regular BP sitting:   142 / 88  (left arm) Cuff size:   regular  Vitals Entered By: Judithe Modest CMA (August 18, 2010 11:49 AM)  Physical Exam  General:  Well developed, well nourished, in no acute distress.  Obese.  Neck:  Neck supple, no JVD. No masses, thyromegaly or abnormal cervical nodes. Lungs:  Clear bilaterally to auscultation and percussion. Heart:  Non-displaced PMI, chest non-tender; regular rate and rhythm, S1, S2 without rubs or gallops. 2/6 early systolic ejection murmur at RUSB.  Carotid upstroke normal, no bruit.  Pedals normal pulses. 1+ left ankle mild edema.  Abdomen:  Bowel sounds positive; abdomen soft and non-tender without masses, organomegaly, or hernias noted. No hepatosplenomegaly. Extremities:  No clubbing or cyanosis. Neurologic:  Alert and oriented x 3. Psych:  Normal affect.   Impression & Recommendations:  Problem # 1:  CAD, NATIVE VESSEL (ICD-414.01) Patient has extensive CAD but no good options for revascularization.  Her EF was preserved without resting wall motion abnormalities on most recent echo in [place].  Myoview in [place] showed no ischemia.  She has not had any chest pain and breathing is much better off beta blockers.  She will continue Plavix and losartan.  I think that it is very important to get her back on a statin.   She had myalgias/elevated CK with Crestor and rash with pravastatin.   I will try her on Lipitor at 5 mg daily.  She thinks she tolerated Lipitor in the past.  CK level in 2 wks and lipids/LFTs in 2 months.   Problem # 2:  SHORTNESS OF BREATH (ICD-786.05) Shortness of breath and cough seems to have essentially resolved off beta blockers.    Problem # 3:  HYPERTENSION (ICD-401.9) BP slightly elevated here today but has been good at home.  Continue current losartan.   Patient Instructions: 1)  Your physician has recommended you make the following change in your medication:  2)  Start Atorvastatin 5mg  daily--this will be one-half of a 10mg  tablet daily. 3)  CPK in 2 weeks---414.01 729.1 4)  Your physician recommends that you return for a FASTING lipid profile/liver profile in 2 months 414.01   5)  Your physician wants you to follow-up in: 4 months with Dr Harmon Dun 2012)  You will receive a reminder letter in the mail two months in advance. If you don't receive a letter, please call our office to schedule  the follow-up appointment. Prescriptions: ATORVASTATIN CALCIUM 10 MG TABS (ATORVASTATIN CALCIUM) one-half daily  #15 x 3   Entered by:   Katina Dung, RN, BSN   Authorized by:   Marca Ancona, MD   Signed by:   Katina Dung, RN, BSN on 08/18/2010   Method used:   Electronically to        [id number] (ret[facility])       [address]       [place], [place]  [address]       Ph: [phone] or [phone]       Fax: [phone]   RxID:   [id number]

## 2010-08-28 ENCOUNTER — Other Ambulatory Visit: Payer: Self-pay | Admitting: Cardiology

## 2010-09-01 ENCOUNTER — Other Ambulatory Visit: Payer: Self-pay | Admitting: *Deleted

## 2010-09-02 ENCOUNTER — Other Ambulatory Visit (INDEPENDENT_AMBULATORY_CARE_PROVIDER_SITE_OTHER): Payer: MEDICARE | Admitting: *Deleted

## 2010-09-02 DIAGNOSIS — I2510 Atherosclerotic heart disease of native coronary artery without angina pectoris: Secondary | ICD-10-CM

## 2010-09-02 DIAGNOSIS — IMO0001 Reserved for inherently not codable concepts without codable children: Secondary | ICD-10-CM

## 2010-09-02 LAB — CK: Total CK: 75 U/L (ref 7–177)

## 2010-09-02 NOTE — Telephone Encounter (Signed)
[address] °

## 2010-09-08 ENCOUNTER — Other Ambulatory Visit (INDEPENDENT_AMBULATORY_CARE_PROVIDER_SITE_OTHER): Payer: MEDICARE | Admitting: *Deleted

## 2010-09-08 DIAGNOSIS — E7800 Pure hypercholesterolemia, unspecified: Secondary | ICD-10-CM

## 2010-09-08 DIAGNOSIS — I2510 Atherosclerotic heart disease of native coronary artery without angina pectoris: Secondary | ICD-10-CM

## 2010-09-08 LAB — HEPATIC FUNCTION PANEL
ALT: 19 U/L (ref 0–35)
AST: 26 U/L (ref 0–37)
Albumin: 3.9 g/dL (ref 3.5–5.2)
Alkaline Phosphatase: 57 U/L (ref 39–117)
Bilirubin, Direct: 0.2 mg/dL (ref 0.0–0.3)
Total Bilirubin: 0.6 mg/dL (ref 0.3–1.2)
Total Protein: 6.9 g/dL (ref 6.0–8.3)

## 2010-09-08 LAB — LIPID PANEL
Cholesterol: 160 mg/dL (ref 0–200)
HDL: 41.8 mg/dL (ref >39.00)
LDL Cholesterol: 100 mg/dL — ABNORMAL HIGH (ref 0–99)
Total CHOL/HDL Ratio: 4
Triglycerides: 93 mg/dL (ref 0.0–149.0)
VLDL: 18.6 mg/dL (ref 0.0–40.0)

## 2010-10-01 ENCOUNTER — Other Ambulatory Visit (HOSPITAL_BASED_OUTPATIENT_CLINIC_OR_DEPARTMENT_OTHER): Payer: Self-pay | Admitting: Internal Medicine

## 2010-10-01 DIAGNOSIS — Z1231 Encounter for screening mammogram for malignant neoplasm of breast: Secondary | ICD-10-CM

## 2010-10-05 ENCOUNTER — Ambulatory Visit
Admission: RE | Admit: 2010-10-05 | Discharge: 2010-10-05 | Disposition: A | Discharge status: Home / Self Care | Payer: MEDICARE | Source: Ambulatory Visit | Attending: Internal Medicine | Admitting: Internal Medicine

## 2010-10-05 ENCOUNTER — Other Ambulatory Visit: Payer: Self-pay | Admitting: Cardiology

## 2010-10-05 DIAGNOSIS — Z1231 Encounter for screening mammogram for malignant neoplasm of breast: Secondary | ICD-10-CM

## 2010-10-18 ENCOUNTER — Other Ambulatory Visit (INDEPENDENT_AMBULATORY_CARE_PROVIDER_SITE_OTHER): Payer: MEDICARE | Admitting: *Deleted

## 2010-10-18 DIAGNOSIS — I2510 Atherosclerotic heart disease of native coronary artery without angina pectoris: Secondary | ICD-10-CM

## 2010-10-18 LAB — HEPATIC FUNCTION PANEL
ALT: 26 U/L (ref 0–35)
AST: 31 U/L (ref 0–37)
Albumin: 3.4 g/dL — ABNORMAL LOW (ref 3.5–5.2)
Alkaline Phosphatase: 69 U/L (ref 39–117)
Bilirubin, Direct: 0 mg/dL (ref 0.0–0.3)
Total Bilirubin: 0.4 mg/dL (ref 0.3–1.2)
Total Protein: 6.4 g/dL (ref 6.0–8.3)

## 2010-10-18 LAB — LIPID PANEL
Cholesterol: 160 mg/dL (ref 0–200)
HDL: 46.4 mg/dL (ref >39.00)
LDL Cholesterol: 96 mg/dL (ref 0–99)
Total CHOL/HDL Ratio: 3
Triglycerides: 88 mg/dL (ref 0.0–149.0)
VLDL: 17.6 mg/dL (ref 0.0–40.0)

## 2010-10-19 NOTE — Assessment & Plan Note (Signed)
[facility]                            CARDIOLOGY OFFICE NOTE   NAME:[name] [name], [name] [name]                     MRN:          [id number]  DATE:02/12/2008                            DOB:          July 04, 1935    PRIMARY CARE PHYSICIAN:  Concha Pyo, NP   HISTORY OF PRESENT ILLNESS:  This is a 75 year old with a history of  hypertension who has been seen in the past by Dr. Diona Browner.  I am seeing  her for the first time  today.  Of note, her last appointment in August,  she had been complaining of prolonged symptoms of bronchitis.  She had  had no worsened cough associated with some tightness in her chest.  At  that time in her last appointment, a left heart catheterization had been  recommended, but the patient and doctor ended up deciding to defer for  now as her symptoms were improving.  The patient sees me today and she  states that she has recovered from the bronchitis episode that she had  this summer.  She continues to have a chronic cough.  She is getting  around much better.  She has no shortness of breath with exertion when  she does her housework which including things as vigorous as pushing her  vacuum.  However, she does continue to report a tiredness and burning  in her lower chest when she climbs a flight of steps.  She will often  have to stop about halfway up and rest, because of the sensation.  She  has been having this now, she says for at least a couple of years.  She  cannot identify any other activities that bring on this tiredness and  burning in her chest.  Otherwise, today, the patient's blood pressure is  a bit elevated at 143/89.   PAST MEDICAL HISTORY:  1. Adenosine Myoview in December 2008, which showed no evidence of      scar or ischemia.  EF was 64%.  2. Hypertension.  3. Peptic ulcer disease.  4. Hysterectomy.  5. History of hepatitis.  6. History of chronic cough, possibly due to gastroesophageal reflux       disease.  7. History of premature ventricular contractions and premature atrial      contractions.   SOCIAL HISTORY:  The patient does not drink or smoke.   MEDICATIONS:  The patient is taking;  1. Cardizem 180 mg daily.  2. Pepcid AC.  3. Multivitamin.   ALLERGIES:  She is allergic to PENICILLIN and ASPIRIN.  She states that  she does get rash when she takes aspirin.  She also does report some  itching after she received CONTRAST for her CT of the chest earlier this  year.   LABORATORY STUDIES:  Most recent labs in March 2008, she had a  creatinine of 0.8 and LDL of 114 and HDL of 45.   PHYSICAL EXAMINATION:  VITAL SIGNS:  Blood pressure 143/89, heart rate  83 and regular with occasional premature beats, and weight is 187  pounds.  GENERAL:  No apparent distress.  NECK:  No JVD.  No thyromegaly or nodule.  LUNGS:  Clear to auscultation bilaterally.  Normal respiratory effort.  CARDIOVASCULAR:  Heart regular S1 and S2.  No S3.  No S4.  There is a  2/6 crescendo-decrescendo early peaking systolic murmur at the right  upper sternal border.  There is no diminishment of S2.  There is no  carotid bruit.  There is no peripheral edema.  ABDOMEN:  Soft and  nontender.  No hepatosplenomegaly.  Obese.  EXTREMITIES:  No clubbing or cyanosis.   ASSESSMENT AND PLAN:  This is a 75 year old with a history of stable  exertional chest pain, hypertension, and a negative Myoview in 2008.  1. Exertional chest pain.  The patient does report tiredness and a      burning in her lower substernal area that reproducibly occurs when      she climb steps and resolves with rest.  This could be stable      angina.  She did had a negative Myoview in 2008.  I do think this      probably does merit further workup as she cannot take aspirin      because of the rash that she gets and if she does have coronary      artery disease, I would like to have her on Plavix and I want to      know her coronary anatomy  before starting it.  Additionally, we      need to check her lipids and a Chem-7 needs to be done prior to the      heart catheterization.  I will do a heart catheterization in the      next week.  She will need to be premedicated because of some mild      itching that she states she had after her CAT scan earlier this      year with contrast.  The itching seems to have been quite prolonged      and I am not sure it was actually due to the contrast.  However, we      will be careful and premedicate her with prednisone and Benadryl.  2. Hypertension.  The patient's blood pressure is elevated today at      143/89.  She is on Cardizem CD 180 mg daily.  I will go ahead and      start her on hydrochlorothiazide 25 mg daily.     Marca Ancona, MD  Electronically Signed    DM/MedQ  DD: 02/12/2008  DT: 02/12/2008  Job #: [id number]   cc:   Concha Pyo, NP  Noralyn Pick. Eden Emms, MD, [facility]

## 2010-10-19 NOTE — Assessment & Plan Note (Signed)
HEALTHCARE                            CARDIOLOGY OFFICE NOTE   NAME:[name] [name], CHIA MOWERS                     MRN:          [id number]  DATE:01/18/2008                            DOB:          [birth year]    PRIMARY CARDIOLOGIST:  Jonelle Sidle, MD.   PRIMARY CARE Tunya Held:  Concha Pyo, NP   PATIENT PROFILE:  This is a 75 year old African American female with  prior history of hypertension who presents following complains of  exhaustion in the setting of bronchitis.   PROBLEMS:  1. History of chest pain      a.     Status post adenosine Myoview May 07, 2007 revealing an       ejection fraction 64% without evidence of scar or ischemia.  2. Hypertension.  3. Peptic ulcer disease.  4. Recent bronchitis flare  5. Status post cholecystectomy.  6. Status post hysterectomy.  7. Chronic active hepatitis.   ALLERGIES:  PENICILLIN, ASPIRIN, and BIAXIN.   HISTORY OF PRESENT ILLNESS:  A 75 year old Afro-American female who last  saw Dr. Diona Browner in clinic on December 2008.  She was in usual state of  health until approximately 1 month ago when she began to experience  significant productive cough with generalized malaise and mild dyspnea.  She also noted significant fatigue with any activities.  She was treated  with a Z-Pak and symptoms persisted for about 3 weeks.  In the last week  of symptoms, she noted some exhaustion in her chest with associated  dyspnea with activity that would resolve with rest.  She notes that she  had similar symptoms in the past when she has had pneumonia or  bronchitis flares.  She is followed up with her primary care [name] [name]  about a week ago and they obtained a CT of her chest at [facility],  that was negative for pulmonary embolism and subsequently recommended  that she follow up with Cardiology.  In discussing her symptoms with her  she is somewhat vague and attributes most of her symptoms to bronchitis  and  goes on to note that since the CT of her chest, which was performed  January 09, 2008.  She has not had any symptoms or limitations.  She  currently denies any chest pain, dyspnea, PND, orthopnea, dizziness,  syncope, or edema.  She still has some cough, which is now not  productive, and is not too bothersome.   HOME MEDICATIONS:  1. Cardizem 180 mg daily.  2. Pepcid AC p.r.n.  3. Maximum Greens.  4. Multivitamin daily.  5. Tylenol p.r.n.   PHYSICAL EXAMINATION:  GENERAL:  She is afebrile.  Pleasant Afro-  American female, in no acute distress.  Awake, alert, oriented x3.  VITAL SIGNS:  Heart rate of 74, respirations 16, blood pressure 143/90,  weight is 186 pounds, which is stable since July 2008.  HEENT:  Normal.  NEURO:  Grossly intact, nonfocal.  SKIN:  Warm and dry without lesions or masses.  NECK:  No bruits, no JVD.  LUNGS:  Respirations are unlabored.  Clear to auscultation.  CARDIAC:  Regular S1 and S2 with a soft systolic murmur along the left  sternal border.  ABDOMEN:  Round, soft, nontender, nondistended.  Bowel sounds present.  EXTREMITIES:  All 4 extremities warm and dry.  No clubbing, cyanosis, or  edema.  Dorsalis pedis, posterior tibial pulses are 2+ and equal  bilaterally.   SENSORY AND CLINICAL FINDINGS:  EKG today shows sinus rhythm with first-  degree AV block.  No acute ST-T changes.   ASSESSMENT AND PLAN:  1. Chest discomfort associated dyspnea, this occur in the setting of      bronchitis and again the patient is somewhat vague in describing      her symptoms.  Since recovering from her bronchitis she has had no      recurrent symptoms.  She had a negative Myoview in December 2008.      Discussing options with regards to reevaluating her symptoms, I      recommended left heart cardiac catheterization to evaluate her      coronary anatomy.  However since she is feeling significantly      better over the past week without any recurrence symptoms or       limitations in activities, she would prefer to defer at this time.      I have recommended that if she has any recurrence of symptoms      whatsoever she should contact our office so that we could then      arrange for catheterization.  In the meantime, we will schedule her      for followup with Dr. Diona Browner in approximately 4 weeks or sooner      if necessary.  2. Hypertension.  Blood pressures are elevated in the office this      morning.  She has not taken her Cardizem yet today.  I have      recommended that she take Cardizem and follow up her blood pressure      at home, and let us know what it runs.  3. Disposition.  The patient to follow up in 4 weeks with Dr.      Diona Browner.  She should call if she      has any recurrence of symptoms, at which point we have low      threshold scheduled cardiac catheterization.      [name], ANP  Electronically Signed      Veverly Fells. Excell Seltzer, MD  Electronically Signed   CB/MedQ  DD: 01/18/2008  DT: 01/19/2008  Job #: [id number]

## 2010-10-19 NOTE — Assessment & Plan Note (Signed)
[facility]                            CARDIOLOGY OFFICE NOTE   NAME:[name] [name], [name] [name]                     MRN:          [id number]  DATE:04/30/2007                            DOB:          Oct 21, 1935    PRIMARY CARE PHYSICIAN:  Dr. Oliver Barre.   REASON FOR VISIT:  Cardiac followup.   HISTORY OF PRESENT ILLNESS:  I saw [name] [name] back in May. She has  been treated for intermittent palpitations with atrial and ventricular  complexes as well as hypertension but no clear evidence of ischemic  heart disease based on previous testing. She states that she is under a  considerable amount of psychosocial stress at this time due to family  disruption. She has been feeling some symptoms of neck pain radiating  down into her right arm associated with some numbness in her more medial  fingers. She states that she has had some cortisone injections around  her spine for this and is actually on Celebrex as well. She has felt  more palpitations and chest fullness recently. Her blood pressure today  is significantly elevated at 152/100 on my check and she states that she  has been off her Cardizem CD 180 mg daily for the last 3 or 4 days. She  was worried about taking this with her Celebrex. Today's  electrocardiogram shows sinus rhythm with a leftward axis which is old  and decreased R wave progression anteriorly. She has had no dizziness or  syncope.   ALLERGIES:  PENICILLIN, ASPIRIN, BIAXIN.   CURRENT MEDICATIONS:  1. Zantac 75 mg 2 tablets p.o. q.h.s.  2. Nexium 40 mg p.o. daily.  3. Celebrex as directed.  4. Cardizem CD 180 mg p.o. daily (not taking).   REVIEW OF SYSTEMS:  As described in the history of present illness.   PHYSICAL EXAMINATION:  Blood pressure 152/100, heart rate 80s, weight  185 pounds.  The patient is comfortable in no acute distress without active chest  pain.  HEENT:  Conjunctiva is normal, oropharynx clear.  NECK:  Supple, no  elevated jugular venous pressure, no loud bruits.  LUNGS:  Clear with elevated breathing.  CARDIAC:  Reveals a regular rate and rhythm, no S3, gallop or  pericardial rub.  ABDOMEN:  Soft, nontender.  EXTREMITIES:  Show no pitting edema. Distal pulses are 2+.  SKIN:  Warm and dry.  MUSCULOSKELETAL:  No kyphosis noted.  NEUROPSYCHIATRIC:  The patient alert and oriented x3.   IMPRESSION/RECOMMENDATIONS:  1. Hypertension, not well controlled. This is in the setting of      significant psychosocial stress and also not being on Cardizem CD      as directed. We talked about reinstituting her Cardizem starting      with a dose today and also will plan a followup adenosine Myoview      given some of her other symptoms of palpitations, right arm      discomfort (possibly neuropathic however) and chest fullness. Our      last Myoview was in May. I will then have her followup in the  office over the next few weeks and discuss the results and reassess      blood pressure assuming her symptoms do not progress in the      interim.  2. Further plans to follow.     Jonelle Sidle, MD  Electronically Signed    SGM/MedQ  DD: 04/30/2007  DT: 05/01/2007  Job #: [id number]

## 2010-10-19 NOTE — Assessment & Plan Note (Signed)
[facility]                             PULMONARY OFFICE NOTE   NAME:[name], [name] MCKEON                     MRN:          [id number]  DATE:12/19/2006                            DOB:          Jul 26, 1935    ADDENDUM   Chest x-ray today was normal.  Sinus CT scan revealed no evidence of  occult sinusitis.  If she fails to respond to the above regimen directed  at the acid component of reflux, I would not hesitate to add Reglan 10  mg before meals and at bedtime to address the non-acid component of  reflux before additional pulmonary workup.     Charlaine Dalton. Sherene Sires, MD, [facility]     MBW/MedQ  DD: 12/19/2006  DT: 12/20/2006  Job #: [id number]   cc:   Anselmo Rod, M.D.  Corwin Levins, MD

## 2010-10-19 NOTE — Assessment & Plan Note (Signed)
[facility]                             PULMONARY OFFICE NOTE   NAME:Underhill, COREY CAULFIELD                     MRN:          [id number]  DATE:12/19/2006                            DOB:          Nov 05, 1935    PULMONARY/EXTENDED FOLLOWUP OFFICE VISIT   HISTORY:  A 75 year old black female with a cough dating back to May  2008.  I had recommended on July 17 that she take Nexium perfectly  regularly before breakfast and Zantac two at bedtime.  Her main  complaint is a hacking cough that occurs at bedtime, productive of  minimum sputum with no excess morning sputum production, fevers, chills,  sweats, orthopnea, PND, leg swelling, chest pain or dyspnea.   PHYSICAL EXAMINATION:  She is an ambulatory black female in no acute  distress.  She is afebrile with stable vital signs.  HEENT:  Remarkable for the absence of turbinate edema, sinuses pallor or  polyps.  Oropharynx clear with no evidence or excessive postnasal drip  or cobblestoning.  She had upper dentures in place.  Oropharynx  otherwise unremarkable.  Lower dentition is intact.  Ear canals clear  bilaterally.  NECK:  Supple without cervical adenopathy or tenderness.  Trachea was  midline, no thyromegaly.  LUNG FIELDS:  Perfectly clear bilaterally to auscultation and  percussion.  There is a regular rate and rhythm without murmur, gallop or rub.  ABDOMEN:  Soft, benign.  EXTREMITIES:  Warm without calf tenderness, cyanosis, clubbing, or  edema.   IMPRESSION:  Prominent nocturnal coughing suggests either reflux (which  might be an issue since she takes the Nexium first thing in the morning)  or chronic sinus disease.  I have recommended therefore proceeding with  a sinus CT scan and continuing treatments directed at treating acid  reflux aggressively with Nexium q.a.m. and Zantac two q.h.s.  If this  eradicates the cough, then I have asked her to return to see Dr. Loreta Ave,  her GI physician of record  with documented reflux already under her  care, versus return to me if treating reflux aggressively and ruling out  sinus disease does not lead to control of the cough.   ADDENDUM  Chest x-ray today was normal.  Sinus CT scan revealed no evidence of  occult sinusitis.  If she fails to respond to the above regimen directed  at the acid component of reflux, I would not  hesitate to add Reglan 10 mg before meals and at bedtime to address the  non-acid component of reflux before additional pulmonary workup.     Charlaine Dalton. Sherene Sires, MD, [facility]  Electronically Signed    MBW/MedQ  DD: 12/19/2006  DT: 12/20/2006  Job #: [id number]   cc:   Anselmo Rod, M.D.  Corwin Levins, MD

## 2010-10-19 NOTE — Assessment & Plan Note (Signed)
[facility]                            CARDIOLOGY OFFICE NOTE   NAME:[name] [name], [name] [name]                     MRN:          [id number]  DATE:05/14/2007                            DOB:          1935/12/21    PRIMARY CARE PHYSICIAN:  Corwin Levins, MD   REASON FOR VISIT:  Cardiac followup.   HISTORY OF PRESENT ILLNESS:  I saw Ms. Hakeem recently in late  November. She was having some symptoms at that time in the setting of  significant psychosocial stress as well as uncontrolled hypertension.  She had not been taking her Cardizem CD and we re-instituted the same  medication. I did arrange a followup stress test as well and she  underwent an adenosine Myoview on the first of December, which was  reassuring, showing no evidence of scar or ischemia with an ejection  fraction of 64%. I reviewed this with the patient today. She says that  some of the stress in her life has started to calm down. She has been  taking her medication and her blood pressure is much better today. She  is not having any significant palpitations.   ALLERGIES:  PENICILLIN, ASPIRIN, BIAXIN.   CURRENT MEDICATIONS:  1. Cardizem CD 180 mg p.o. daily.  2. Pepcid AC.  3. Celebrex.   REVIEW OF SYMPTOMS:  As described in history of present illness.   PHYSICAL EXAMINATION:  Blood pressure 118/90, heart rate 88. Weight is  186 pounds. The patient is comfortable in no acute distress.  NECK: Reveals no elevated jugular pressure. No loud bruits.  LUNGS:  Are clear without labored breathing.  CARDIAC: Reveals a regular rate and rhythm, soft systolic murmur. No  S3  gallop.  EXTREMITIES: Show no pitting edema.   IMPRESSIONS AND RECOMMENDATIONS:  1. Hypertension, much better controlled today. I have encouraged the      patient to continue her Cardizem CD 180 mg p.o. daily and a refill      was provided. I will see her back in one years time, otherwise she      will continue followup with  Dr. Jonny Ruiz.  2. History of palpitations, quiescent.  3. Reassuring ischemic evaluation.     Jonelle Sidle, MD  Electronically Signed    SGM/MedQ  DD: 05/14/2007  DT: 05/14/2007  Job #: [id number]   cc:   Corwin Levins, MD

## 2010-10-19 NOTE — Assessment & Plan Note (Signed)
[facility]                             PULMONARY OFFICE NOTE   NAME:[name] [name], [name] [name]                     MRN:          [id number]  DATE:11/21/2006                            DOB:          03-Jun-1936    REASON FOR CONSULTATION:  Recurrent pneumonia.   HISTORY:  This is a 75 year old black female who has been under the care  of Dr. Loreta Ave for reflux but does not take anything chronically.  In  January, she was diagnosed with pneumonia and then had another spell of  it 3 weeks ago.  This consisted of a sensation of an earache and sore  throat followed by a severe cough with light mucus production and chest  discomfort that was midline (it did not lateralize) associated with  dyspnea with activity but not at rest.  She, at no point, had any  purulence, sputum, hemoptysis, fever, or purulent nasal excretions,  itching or sneezing, or subjective wheezing.   She says she is much better after a course of Avelox and prednisone  given at Urgent Care where a diagnosis of pneumonia: was made.  (She  did not bring these x-rays nor the x-rays from January where the  pneumonia was felt to be present).   She says that 2 spirals were vertically identical.  Neither time did  she have fever or appearance of sputum.   She denies any recent dental work or previous history of pneumonia  except as a child.   PAST MEDICAL HISTORY:  1. Hypertension.  2. She is status post cholecystectomy, hysterectomy, and previous      breast surgery and also carries a diagnosis of chronic active      hepatitis.  3. She says she is better now except for 1 episode of severe chest      pain 3 days ago which was felt to be due to reflux, seen in the      emergency room at [facility] and released without any additional studies      done.  Since that time, she resumed PPI.  She continues to have a      sensation of throat drainage but denies bringing up any mucus and      has no nocturnal  respiratory complaints, fevers, chills, sweats,      unintended weight loss, or difficulty swallowing.   ALLERGIES:  PENICILLIN, ASPIRIN, BIAXIN, ALL WITH NONSPECIFIC REACTIONS.   MEDICATIONS:  1. Inconsistently using Nexium.  2. Also taking Cardizem daily.   SOCIAL HISTORY:  She has never smoked.  She is retired.  She has been  exposed to her husband's smoke but not for over 5 years.   FAMILY HISTORY:  Positive for asthma in a mother who never smoked.  Negative for respiratory diseases.   REVIEW OF SYSTEMS:  Taken on the work sheet and negative except for  noted above.   PHYSICAL EXAMINATION:  GENERAL:  This is a pleasant, ambulatory black  female in no acute distress.  VITAL SIGNS:  Normal vital signs.  She does clear her throat frequently  during the exam; however.  HEENT:  Oropharynx is perfectly clear.  Nasal turbinates are normal.  Ear canals clear bilaterally.  NECK:  Supple without cervical adenopathy or tenderness. Trachea is  midline.  LUNGS:  Lung fields are perfectly clear bilaterally to auscultation and  percussion.  HEART:  Regular rate and rhythm without murmur, gallop, or rub present.  ABDOMEN:  Soft, benign.  EXTREMITIES:  Warm without calf tenderness, cyanosis, clubbing.   Oxygen saturation is 95% on room air.   No x-rays were available to review today.  She has several endoscopies  in our records which show small hiatal hernia but no overt reflux, the  most recent done in 1999.   IMPRESSION:  Recurrent cough that most likely is on the basis of reflux  in this patient with moderate obesity on calcium channel blockers.  I  cannot be sure that the reflux actually started the ball rolling (well  could have been a viral upper respiratory infection sinusitis, tracheal  bronchitis, or even bronchopneumonia).  However, against infection is  the fact she never had purulent sputum or fever.   Since she is not compliant with chronic acid reflux suppression, I   recommended the following approach:  1. Take proton pump inhibitor or whatever Dr. Loreta Ave chooses (she has an      appointment later today) 30-60 minutes before breakfast.  2. Zantac 2 q.h.s. and observe a strict reflux diet for the next 4      weeks to see if all of her cough and throat drainage resolves.      At the same time, suppress cough with Delsym 2 teaspoons b.i.d.   I spent extra time with this patient going over the nature of cyclical  coughing in detail in both graphic and text formatted material to get  her enthusiastic participation and heavy acid suppression.   To address the issue of whether there is recurrent pneumonia that needs  workup, I am going to ask the patient to return in 4 weeks with a chest  x-ray and at that time produce the x-rays from  Urgent Care for apples to apples comparison to see if additional workup  is needed (which might include a sinus CT and/or a chest CT scan).     Charlaine Dalton. Sherene Sires, MD, [facility]  Electronically Signed    MBW/MedQ  DD: 11/21/2006  DT: 11/21/2006  Job #: [id number]   cc:   Corwin Levins, MD

## 2010-10-19 NOTE — Assessment & Plan Note (Signed)
[facility]                            CARDIOLOGY OFFICE NOTE   NAME:[name], [name] DJORDJEVIC                     MRN:          [id number]  DATE:10/13/2006                            DOB:          1935-09-24    PRIMARY CARE PHYSICIAN:  Dr. Oliver Barre   REASON FOR VISIT:  Follow up cardiac testing.   HISTORY OF PRESENT ILLNESS:  I saw [name] [name] back in late April. She  was referred at that time with perioperative palpitations and blood  pressure fluctuations.  We initiated a trial of Cardizem CD 180 mg p.o.  daily and she reports tolerating this quite well.  She had a followup  echocardiogram which reported an ejection fraction of 50% with some  description of hypokinesis at the distal anterior, distal antral lateral  and apical walls, although in a setting of frequent premature  ventricular complexes during the study.  Her Myoview however,  demonstrated an ejection fraction of 60%.  It was overall a low risk  scan with probable normal perfusion and soft tissue attenuation  affecting the inferior wall. No significant change was noted compared to  the prior study from 2005.  [name] [name] denies having any exertional  chest pain or dyspnea.  She continues to wear a boot on her right foot  status post surgery.   ALLERGIES:  PENICILLIN, ASPIRIN and BIAXIN.   PRESENT MEDICATIONS:  Cardizem CD 180 mg p.o. daily.   REVIEW OF SYSTEMS:  As described in history of present illness.   EXAMINATION:  Blood pressure is 100/72, heart rate is 76, weight is 184  pounds.  The patient is comfortable in no acute distress.  NECK:  Reveals no other jugular pressures without bruits.  No  thyromegaly is noted.  LUNGS:  Are clear.  No labored breathing.  CARDIAC:  Shows a regular rate and rhythm. No ectopy noted.  EXTREMITIES:  Exhibit no pitting edema.   IMPRESSION/RECOMMENDATION:  History of palpitations with documented  premature atrial and ventricular complexes,  both recently and in the  past.  Ejection fraction is overall normal, based on recent assessment.  She had no clear evidence of ischemia on Myoview scan, and is not having  any chest pain.  We will plan to continue Cardizem CD 180 mg daily for  management of both high blood pressure and palpitations.  Her blood  pressure is borderline low today, although she is tolerating it without  active dizziness.  I have asked her to let us know if she  experiences dizziness and we may need to back down to 120 mg daily.  Otherwise, I will plan to see her back over the next 6 months.     Jonelle Sidle, MD  Electronically Signed    SGM/MedQ  DD: 10/13/2006  DT: 10/13/2006  Job #: [id number]   cc:   Corwin Levins, MD

## 2010-10-19 NOTE — Assessment & Plan Note (Signed)
[facility]                            CARDIOLOGY OFFICE NOTE   NAME:[name] [name], [name] [name]                     MRN:          [id number]  DATE:02/29/2008                            DOB:          20-Dec-1935    PRIMARY CARE PHYSICIAN:  Concha Pyo, NP   HISTORY OF PRESENT ILLNESS:  This is a 75 year old with a history of  hypertension and coronary artery disease who returns for followup after  a left heart catheterization.  The patient's left heart catheterization  showed a normal ejection fraction with a totally occluded RCA and  moderate disease in the mid LAD and a subtotal distal LAD.  There were  poor distal targets for the bypass surgery.  It was decided that the  patient would be managed medically.  At the time of discharge, we  adjusted her medication regimen to decrease her blood pressure and to  treat angina.  She was sent home on Toprol-XL 25 mg daily, lisinopril 10  mg daily, Lipitor 40, and hydrochlorothiazide 25 as well as starting  Plavix.  She states that on this regimen she has been doing quite well.  She has had no further chest burning with exertion.  She states that she  has been walking for 30 minutes every morning.  She has not got any  chest burning with her walking.  She has had no further anginal-type  symptoms at all.  She does not get dyspnea on exertion.  She does not  have orthopnea or PND.  She is able to do all her normal activities of  daily living such as laundry, cooking and cleaning with no problems.  Also of note, the patient did have a history of an ASPIRIN allergy.  Apparently, she had a rash after taking 325 mg of aspirin on her own  volition.  After the heart catheterization, she began taking aspirin 81  mg daily, and she has actually done okay with that.  She has had no rash  and no wheezing and has been on it for several weeks now.   PAST MEDICAL HISTORY:  1. Coronary artery disease.  Left heart catheterization  done in      September 2009 because of exertional chest pain.  It showed an EF      of 60% on left ventriculogram, RCA was totally occluded proximally      with left-to-right collaterals, the distal left main had a 20%      lesion, there was a small ramus with a 60-70% stenosis, and the mid      LAD had  serial 60% stenoses with a distal subtotalled LAD.  There      was a 50% first diagonal stenosis and a 50% first OM stenosis.      There were poor distal targets for bypass surgery and medical      management was planned.  2. Hypertension.  3. Peptic ulcer disease.  4. Hysterectomy.  5. History of hepatitis.  6. History of chronic cough, possibly due to gastroesophageal reflux      disease.  7. History of PVCs and  PACs.   SOCIAL HISTORY:  The patient does not drink or smoke.  She lives with  her husband.   MEDICATIONS:  1. Plavix 75 mg daily.  2. Aspirin 81 mg daily.  3. Toprol 25 mg daily.  4. Lipitor 40 mg daily.  5. Lisinopril 10 mg daily.  6. Hydrochlorothiazide 25 mg daily.   ALLERGIES:  The patient does have a CONTRAST allergy.  She also  supposedly had an allergy to ASPIRIN, which caused a rash.  However, she  has started taking 81 mg of aspirin on her own volition and has done  fine with no rash or wheezing.   EKG today; normal sinus rhythm with PVCs.  There is poor anterior R-wave  progression.   LABORATORY STUDIES:  Most recent labs on September 2009, BUN 21,  creatinine 1.1, LDL 109, HDL 36.8.   PHYSICAL EXAMINATION:  VITAL SIGNS:  On exam today, blood pressure  138/90, heart rate 73 and regular, and weight is 186.  GENERAL:  This is a mildly obese female with no apparent distress.  NECK:  No JVD.  There is no thyromegaly or nodule.  LUNGS:  Clear to auscultation bilaterally with normal respiratory  effort.  HEART:  Regular S1 and S2.  No S3.  No S4.  There is a 1/6 crescendo-  decrescendo or early peaking systolic murmur at the right upper sternal  border.   There is no diminishment of S2.  There is no carotid bruit.  There is no peripheral edema.  ABDOMEN:  Soft and nontender.  No hepatosplenomegaly.  EXTREMITIES:  No clubbing or cyanosis.   ASSESSMENT AND PLAN:  This is a 75 year old with coronary disease and  hypertension who presents for followup.  1. Coronary disease.  The patient has had no ischemic symptoms since      cath.  I think her medication regimen is controlling her angina      quite well.  We will continue her on her current meds.  We will      increase her Toprol-XL to 50 mg daily to better control her blood      pressure.  I have encouraged her to continue on her exercise      regimen as she is doing.  2. Hypertension.  The patient's blood pressure is 138/90, which is      borderline. I am going to increase her Toprol-XL to 50 mg daily.      We will bring her back in 2 weeks for blood pressure check.  3. Hypercholesterolemia.  She is on Lipitor 40 mg daily.  Now, we just      started this.  We will bring her back in 3 months to check her      lipids and LFTs.  4. We will see the patient back in followup in 6 months.      Marca Ancona, MD  Electronically Signed    DM/MedQ  DD: 02/29/2008  DT: 03/01/2008  Job #: [id number]   cc:   Concha Pyo, NP  Pricilla Riffle, MD, [facility]

## 2010-10-19 NOTE — Cardiovascular Report (Signed)
[name] [name], [name] [name]              ACCOUNT NO.:  [id number]   MEDICAL RECORD NO.:  [id number]          PATIENT TYPE:  OIB   LOCATION:  1963                         FACILITY:  [facility]   PHYSICIAN:  Marca Ancona, MD      DATE OF BIRTH:  09/27/1935   DATE OF PROCEDURE:  02/20/2008  DATE OF DISCHARGE:  02/20/2008                            CARDIAC CATHETERIZATION   PROCEDURE:  1. Left heart catheterization.  2. Coronary angiography  3. Left ventriculography.   INDICATIONS:  Stable angina.   PROCEDURE:  After informed consent was obtained, the right groin was  sterilely prepped and draped.  The right groin area was anesthetized  with 1% lidocaine for local anesthesia.  The right common femoral artery  was accessed using Seldinger technique and a 4-French arterial sheath  was placed.  The left coronary artery was engaged using the 4-French JL-  4 catheter.  The right coronary artery was engaged using the 4-French  3DRC catheter and the left ventricle was entered using the angled  pigtail catheter.  There were no complications.   FINDINGS:  1. Hemodynamics.  LV 127/7/16, aorta 125/52/87.  2. Left ventriculography:  EF is 60%, the basal to mid inferior wall      is hypokinetic.  There is no significant gradient on withdrawal      from the left ventricle to the aorta.  3. Coronary angiography.  The coronary system is right-dominant.  The      RCA is totally occluded proximally.  There are left-to-right      collaterals that fill the distal RCA.  The RCA is fairly well      collateralized.  The distal left main has a 20% narrowing.  There      is a small ramus intermedius with a 60-70% stenosis in the      midportion of the vessel.  The LAD has serial 70% and 60% stenoses      in the midportion of the vessel.  The distal LAD is subtotally      occluded.  The first diagonal has approximately 50% stenosis in its      midportion.  The circumflex has luminal irregularities and the      first  obtuse marginal off the circumflex has a 50% midvessel      stenosis.   ASSESSMENT:  This is a 75 year old with significant three-vessel  coronary artery disease.  She does not have any critical stenoses in her  proximal left main, her left circumflex, or her proximal LAD.  There are  moderate stenoses in the mid LAD and the distal LAD is subtotally  occluded.  The RCA is totally occluded proximally.  There are good  collaterals from the left to right supplying the distal LAD territory.  Her ejection fraction is preserved.  After nitroglycerin was given,  there was minimal change in the appearance of the LAD lesions.  I do not  think that this patient is a bypass candidate as she would have very  poor distal targets.  I do not see any discrete lesion for which PCI  would  be helpful to her.  I think at this time we will plan on medical  management of her angina pectoris.  I will review her films with our  interventional cardiologist.  In the meantime, she is allergic to  aspirin so I am going to start her on Plavix 75 mg daily.  Additionally,  I will start her on Lipitor 40 mg daily.  We will stop the Cardizem and  start her on Toprol-XL 25 mg daily.  She will also start on lisinopril  10 mg daily.  We will continue her on  hydrochlorothiazide 25 mg daily.  Of note, the patient was significantly  hypertensive here in the Cath Lab today with her blood pressures in the  180s/120s.  This improved with hydralazine and sedation.  However, she  does need better blood pressure control.  I will see her back in the  office in 2 weeks.      Marca Ancona, MD  Electronically Signed     DM/MEDQ  D:  02/20/2008  T:  02/21/2008  Job:  [id number]   cc:   Concha Pyo, NP

## 2010-10-22 NOTE — Procedures (Signed)
[facility]  Patient:    [name] [name], [name] [name]             MRN: [id number] Proc. Date: 01/03/00 Adm. Date:  [id number] Attending:  Deneen Harts CC:         Merlene Laughter. Renae Gloss, M.D.             Anselmo Rod, M.D.                           Procedure Report  PROCEDURE PERFORMED:  Percutaneous liver biopsy.  ENDOSCOPIST:  Griffith Citron, M.D.  INDICATIONS FOR PROCEDURE:  The patient is a 75 year old African-American female with a diagnosis of chronic viral hepatitis C.  Undergoing liver biopsy to assess degree of inflammation and stage of fibrosis in anticipation of possible antiviral therapy.  DESCRIPTION OF PROCEDURE:  After reviewing the nature of the procedure with the patient including potential risks of hemorrhage and perforation, informed consent was signed.  The patient was examined and a site selected in the ninth intercostal space, right midaxillary line with dullness to percussion and full exploration.  IV sedation was administered totaling Versed 3 mg, fentanyl 25 mcg administered in divided doses prior to the onset of the procedure.  The patient was maintained on low-flow oxygen and DataScope monitor throughout.  The biopsy site was prepped with Betadine followed by local 1% Xylocaine infiltration.  Following trocar puncture, using a [place] liver biopsy technique, a single pass was made.  With this, two cores of grossly normal-appearing hepatic tissue were obtained.  Each approximately 1.5 cm in length.  Both were submitted for routine H/C.  The wound was dressed and the patient was placed in the right lateral decubitus position.  The patient was returned to recovery in stable condition.  ASSESSMENT: 1. Successful percutaneous liver biopsy without immediate complication. 2. Chronic viral hepatitis C.  RECOMMENDATIONS: 1. Patients instructions viewed with the patient and her husband. 2. Postbiopsy orders written. 3.  Return office visit in two weeks to follow-up pathology. DD:  01/03/00 TD:  01/04/00 Job: [id number]

## 2010-10-22 NOTE — Procedures (Signed)
[name]  Patient:    [name] [name], [name] [name]             MRN: [id number] Proc. Date: 04/19/00 Adm. Date:  [id number] Attending:  Charna Elizabeth CC:         Merlene Laughter. Renae Gloss, M.D.  Griffith Citron, M.D.   Procedure Report  DATE OF BIRTH:  1935-07-29  REFERRING PHYSICIAN:  Merlene Laughter. Renae Gloss, M.D.  PROCEDURE PERFORMED:  Esophagogastroduodenoscopy with biopsies.  ENDOSCOPIST:  Anselmo Rod, M.D.  INSTRUMENT USED:  Olympus video panendoscope.  INDICATIONS FOR PROCEDURE:  The patient is a 75 year old African-American female with a history of epigastric pain not responding to Axid, rule out peptic ulcer disease, esophagitis, gastritis, etc.  The patient has had problem with active reflux in the recent past.  PREPROCEDURE PREPARATION:  Informed consent was procured from the patient. The patient was fasted for eight hours prior to the procedure.  PREPROCEDURE PHYSICAL:  The patient had stable vital signs.  Neck supple. Chest clear to auscultation.  S1, S2 regular.  Abdomen soft with normal abdominal bowel sounds.  DESCRIPTION OF PROCEDURE:  The patient was placed in left lateral decubitus position and sedated with 60 mg of Demerol and 7 mg of Versed intravenously. Once the patient was adequately sedated and maintained on low-flow oxygen and continuous cardiac monitoring, the Olympus video panendoscope was advanced through the mouthpiece, over the tongue, into the esophagus under direct vision.  The entire esophagus appeared normal without evidence of ring, stricture, masses, lesions, esophagitis or Barretts mucosa.  The scope was then advanced to the stomach.  A small hiatal hernia was seen on retroflexion with some liquid debris in the stomach.  There was evidence of active reflux into the distal esophagus.  There was a small antral nodule seen along the greater curvature that was biopsied for pathology.  There was  central ulceration on the superior aspect of this nodule.  This may ____________ There was diffuse mottling of the gastric mucosa consistent with moderate diffuse gastritis, no frank ulcers or erosions were seen besides ulceration seen over the antral nodule.  The duodenal bulb and the small bowel distal to the bulb up to 60 cm appeared normal.  There was no outlet obstruction.  The patient tolerated the procedure well without complications.  IMPRESSION: 1. Normal-appearing esophagus. 2. Small hiatal hernia. 3. Moderate diffuse gastritis. 4. Antral nodule ____________ nodule with central ulceration biopsied for    pathology, question ____________ . 5. Normal proximal small bowel.  RECOMMENDATION: 1. Await pathology results. 2. Change Axid to Nexium 40 mg 1 p.o. q.d. 3. Avoid nonsteroidals. 4. Outpatient follow-up in the next two weeks. DD:  04/19/00 TD:  04/19/00 Job: [id number]

## 2010-10-22 NOTE — Assessment & Plan Note (Signed)
[facility]                                 ON-CALL NOTE   NAME:[name], [name] NEUMEYER                     MRN:          [id number]  DATE:05/03/2009                            DOB:          16-Sep-1935    Ms. Upshur called with questions about her colonoscopy prep.  She is  to have a colonoscopy and upper endoscopy tomorrow morning at [facility].  She is concerned because there is sodium in MoviPrep  and she states she is allergic to sodium.  She could not really describe  a specific allergic reaction but states in the past when she has taken  stimulant laxatives and citrate of magnesia she has had difficulty with  nausea and what sounds like presyncope.  I advised her to try the  MoviPrep as prescribed by Dr. Loreta Ave, should she have difficulty with this  she can always call back.  It sounds like she may have been prescribed  citrate of magnesia but she is not going to take that.  She asked about  what medications to take and I instructed her to hold the medications  she was told to.  That sounds like it is only Plavix.     Iva Boop, MD,FACG  Electronically Signed    CEG/MedQ  DD: 05/03/2009  DT: 05/03/2009  Job #: [id number]   cc:   Anselmo Rod, M.D.

## 2010-10-22 NOTE — Assessment & Plan Note (Signed)
[name]                            CARDIOLOGY OFFICE NOTE   NAME:[name] [name]                     MRN:          [id number]  DATE:09/28/2006                            DOB:          1935-07-01    FOLLOWUP VISIT:   PRIMARY CARE PHYSICIAN:  Dr. Oliver Barre.   REASON FOR VISIT:  Recent perioperative palpitations and blood pressure  fluctuation.   HISTORY OF PRESENT ILLNESS:  I last saw [name] [name] back in August  2006.  She recently underwent some right foot surgery (bunionectomy) as  an outpatient on the 17th of August.  I am not entirely clear about the  type of anesthesia, but the patient states that she was put to sleep.  I have limited information, but it seems that she was noted to have  heart rate irregularity with documentation of premature atrial and  ventricular complexes, but no sustained arrhythmias, based on the  telemetry strips and electrocardiogram that I have available today.  She  has a known history of palpitations dating back several years and has  undergone evaluation in the past including a low-risk Cardiolite in  August 2005 as well as echocardiogram demonstrating normal left  ventricular systolic function with only trace mitral and tricuspid  regurgitation.  She has a history of hypertension and apparently there  was some fluctuation in her blood pressure seen around the time of  surgery, although I do not have this well-documented for review.   In the past she has taken Norvasc and subsequently Benicar HCT, although  following my last visit with her, we tried [place] (a combination of  verapamil and trandolapril) to see if this would help with both  palpitations and blood pressure.  She tells me that she subsequently  discontinued this because it made her sick and she felt jittery with  this.  She has not been on any antihypertensives since then.  [name] saw Dr. Jonny Ruiz recently and she is already scheduled to  undergo  a Myoview and echocardiogram on May 1.  I spoke with her today and  reviewed her recent tracings.  She is not experiencing any chest pain  and has had no syncope.   ALLERGIES:  PENICILLIN, ASPIRIN, BIAXIN and ACE INHIBITORS.   CHRONIC MEDICATIONS:  None.  She uses Tylenol p.r.n.   REVIEW OF SYSTEMS:  As described in present illness, otherwise negative.   EXAMINATION:  Blood pressure is 135/91.  Heart rate is 85.  Weight is  183 pounds.  The patient is comfortable and in no acute distress.  HEENT:  Conjunctivae and lids normal.  Oropharynx is clear.  NECK:  Supple.  No elevated jugular venous pressure.  No carotid bruits.  No thyromegaly is noted.  LUNGS:  Clear without labored breathing at rest.  CARDIAC:  Regular rate and rhythm.  No loud murmur, pericardial rub or  S3 gallop.  ABDOMEN:  Soft and nontender.  No obvious hepatomegaly.  EXTREMITIES:  Exhibit no pitting edema on the left.  There is an  ambulatory fixation boot on the right leg.  SKIN:  Warm  and dry.  MUSCULOSKELETAL:  No kyphosis is noted.  NEUROPSYCHIATRIC:  The patient is alert and oriented x3.   IMPRESSION AND RECOMMENDATIONS:  1. History of palpitations with recently documented premature atrial      and ventricular complexes as outlined above.  I suspect this is not      a new problem, given her history.  She is not experiencing any      chest pain and has no clear history of coronary artery disease.      Leftward axis/Q waves were noted inferiorly on the patient's      electrocardiogram in the perioperative setting.  She had an old      tracing from April 2007 with similar findings.  At this point, Ms.      [name] is already scheduled for a followup echocardiogram and      Myoview on the 1st of May.  I plan a trial of Cardizem LA 180 mg      daily to see if this helps with both hypertension and palpitations.      We will have her follow up in the office over the next few weeks to      see how she  is doing symptomatically and review her followup      cardiac testing.  2. Further plans to follow.     Jonelle Sidle, MD  Electronically Signed    SGM/MedQ  DD: 09/28/2006  DT: 09/28/2006  Job #: [id number]   cc:   Corwin Levins, MD

## 2010-10-22 NOTE — Op Note (Signed)
NAME:  [name] [name], [name] [name]                        ACCOUNT NO.:  [id number]   MEDICAL RECORD NO.:  [id number]                   PATIENT TYPE:  AMB   LOCATION:  ENDO                                 FACILITY:  [facility]   PHYSICIAN:  Anselmo Rod, M.D.               DATE OF BIRTH:  [birth year]   DATE OF PROCEDURE:  02/11/2004  DATE OF DISCHARGE:                                 OPERATIVE REPORT   PROCEDURE:  Colonoscopy with biopsies x4.   ENDOSCOPIST:  Anselmo Rod, M.D.   INSTRUMENT USED:  Olympus video colonoscope.   INDICATIONS FOR PROCEDURE:  A 75 year old African-American female with a  family history of colon cancer undergoing a screening colonoscopy after five  years to rule out polyps, masses, etc.   PREPROCEDURE PREPARATION:  Informed consent was procured from the patient.  The patient was fasted for eight hours prior to the procedure, and prepped  with a bottle of magnesium citrate and a gallon of GoLYTELY the night prior  to the procedure.   PREPROCEDURE PHYSICAL EXAMINATION:  VITAL SIGNS:  Stable.  NECK:  Supple.  CHEST:  Clear to auscultation.  S1 and S2 regular.  ABDOMEN:  Soft with normal bowel sounds.   DESCRIPTION OF PROCEDURE:  The patient was placed in the left lateral  decubitus position and sedated with 100 mg of Demerol and 9 mg of Versed in  slow incremental doses.  Once the patient was adequately sedated and  maintained on low flow oxygen and continuous cardiac monitoring, the Olympus  video colonoscope was advanced from the rectum to the cecum.  The  appendiceal orifice and ileocecal valve were clearly visualized and  photographed.  No masses, polyps, erosions, etc., were seen.  A few left-  sided diverticula were noted in the colon.  Three small sessile polyps were  biopsied from the rectum and one from the cecal base.  Retroflexion in the  rectum revealed no abnormalities.  The patient tolerated the procedure well  without complications.   IMPRESSION:  1.  Small sessile polyp biopsied from the cecal base.  2.  Three small sessile polyps biopsied from the rectum.  3.  A few left-sided diverticula.  4.  Normal appearing transverse colon and right colon.  5.  Normal terminal ileum.   RECOMMENDATIONS:  Await pathology results.  Avoid nonsteroidals including  aspirin for the next two weeks.  Outpatient follow-up in the next two weeks  for further recommendations.                                               Anselmo Rod, M.D.    JNM/MEDQ  D:  02/11/2004  T:  02/11/2004  Job:  [id number]   cc:   Merlene Laughter. Renae Gloss, M.D.  [address]  [address]  [place]  [place] [address]  Fax: [phone]

## 2010-10-26 ENCOUNTER — Other Ambulatory Visit: Payer: Self-pay | Admitting: *Deleted

## 2010-10-26 DIAGNOSIS — I10 Essential (primary) hypertension: Secondary | ICD-10-CM

## 2010-10-26 DIAGNOSIS — I2510 Atherosclerotic heart disease of native coronary artery without angina pectoris: Secondary | ICD-10-CM

## 2010-12-22 ENCOUNTER — Other Ambulatory Visit (INDEPENDENT_AMBULATORY_CARE_PROVIDER_SITE_OTHER): Payer: 59 | Admitting: *Deleted

## 2010-12-22 DIAGNOSIS — I2510 Atherosclerotic heart disease of native coronary artery without angina pectoris: Secondary | ICD-10-CM

## 2010-12-22 DIAGNOSIS — I10 Essential (primary) hypertension: Secondary | ICD-10-CM

## 2010-12-22 LAB — HEPATIC FUNCTION PANEL
ALT: 20 U/L (ref 0–35)
AST: 17 U/L (ref 0–37)
Albumin: 3.7 g/dL (ref 3.5–5.2)
Alkaline Phosphatase: 64 U/L (ref 39–117)
Bilirubin, Direct: 0.1 mg/dL (ref 0.0–0.3)
Total Bilirubin: 0.2 mg/dL — ABNORMAL LOW (ref 0.3–1.2)
Total Protein: 6.2 g/dL (ref 6.0–8.3)

## 2010-12-22 LAB — LIPID PANEL
Cholesterol: 153 mg/dL (ref 0–200)
HDL: 57.9 mg/dL (ref >39.00)
LDL Cholesterol: 82 mg/dL (ref 0–99)
Total CHOL/HDL Ratio: 3
Triglycerides: 67 mg/dL (ref 0.0–149.0)
VLDL: 13.4 mg/dL (ref 0.0–40.0)

## 2010-12-24 ENCOUNTER — Other Ambulatory Visit: Payer: Self-pay | Admitting: *Deleted

## 2010-12-27 ENCOUNTER — Encounter: Payer: Self-pay | Admitting: Cardiology

## 2010-12-30 ENCOUNTER — Ambulatory Visit (INDEPENDENT_AMBULATORY_CARE_PROVIDER_SITE_OTHER): Payer: 59 | Admitting: Cardiology

## 2010-12-30 ENCOUNTER — Encounter: Payer: Self-pay | Admitting: Cardiology

## 2010-12-30 DIAGNOSIS — I2510 Atherosclerotic heart disease of native coronary artery without angina pectoris: Secondary | ICD-10-CM

## 2010-12-30 DIAGNOSIS — E785 Hyperlipidemia, unspecified: Secondary | ICD-10-CM

## 2010-12-30 DIAGNOSIS — I10 Essential (primary) hypertension: Secondary | ICD-10-CM

## 2010-12-30 DIAGNOSIS — E7800 Pure hypercholesterolemia, unspecified: Secondary | ICD-10-CM

## 2010-12-30 NOTE — Assessment & Plan Note (Signed)
Patient is tolerating Crestor 10 mg daily without much problem and LDL is close to goal (< 70).  I will have her continue Crestor at this dose.  Given prior elevated CPK while on Crestor, will check CPK.

## 2010-12-30 NOTE — Progress Notes (Signed)
PCP: [name] [name]  75 yo with history of CAD, asthma, and osteoarthritis presents for cardiology followup.  Patient had a long workup for bronchospasm with Dr. Vassie Loll.  It now appears that she is very sensitive to beta blockers, with bronchospasm/dyspnea with even the most beta-1 selective agents.  She is, therefore, no longer on a beta blocker.  Her aspirin was also stopped due to concern that it could have been contrbuting to bronchospasm.    Since I last saw her, she has been stable.  She does not have exertional dyspnea though she is somewhat limited in the amount of activity she gets by ongoing left knee pain.  No chest pain.  Her BP is 168/96 today, but she has not taken losartan.  She says that her BP at home when she uses her home cuff is almost always in the 110s systolic.  She has been taking Crestor 10 mg daily and is tolerating it without myalgias.    Labs (6/11): LDL 101, HDL 53 Labs (9/11): K 4.6, creatinine 0.9 Labs (7/12): LDL 82, HDL 57, LFTs normal  Allergies (verified):  1)  ! Pcn 2)  ! Biaxin 3)  ! * Pravastatin 4)  * Contrast Dye  Past Medical History: 1. Coronary artery disease.  Left heart catheterization was done in September 2009 because of exertional chest pain.  It showed an EF of 60% on left ventriculogram, RCA was totally occluded proximally with left-to-right collaterals, the distal left main had a 20% lesion, there was a small ramus with a 60-70% stenosis, and the mid LAD had  serial 60% stenoses with a distal subtotalled LAD.  There was a 50% first diagonal stenosis and a 50% first OM stenosis. There were poor distal targets for bypass surgery and no good interventional target so medical management was planned. Myoview in [place] in 3/11 was normal per report.  2. Hypertension. 3. Peptic ulcer disease. 4. Hysterectomy. 5. History of  HBV, HCV 6. History of chronic cough.  ACEI stopped.  Possible contribution from GERD. 7. History of PVCs and PACs. 8. GERD 9.  IBS 10. Depression 11. Hyperlipidemia: CPK elevated while on Crestor.  She thinks pravastatin caused a rash.  12. Obesity 13. h/o pancreatitis 14. Osteoporosis 15. Knee osteoarthritis s/p left TKR.  16. Dyspnea/cough: Echo (3/10): EF 55-60%, no regional wall motion abnormalities, aortic sclerosis.  Echo (3/11) in [place]: EF 60%, grade I diastolic dysfunction, normal RV, no regional wall motion abnormalities, valves are ok.  PFTs (10/10): FVC 103%, FEV1 109%, ratio 74%.  Mild obstruction.  CT chest (6/11) did not show evidence for interstitial lung disease.  Possible asthma or upper airways cough syndrome.  Aspirin and beta blocker have been held as potential causes for bronchospasm.    Family History: Family History Asthma-sister, mother Family History Emphysema-sister Family History Rheumatoid Arthritis-mother Family History Colon Cancer-maternal aunt Family History Lung Cancer-maternal aunt Pancreatic Cancer-brother Family History C V A / Stroke -mother  Social History: The patient does not drink or smoke (never smoked).   Marital Status:Married lives with husband  Children: Yes Occupation: Retired [employer]  Patient never smoked. (2nd hand smoke exposure x 40 years)  Review of Systems        All systems reviewed and negative except as per HPI.   Current Outpatient Prescriptions  Medication Sig Dispense Refill  . albuterol (PROVENTIL) (2.5 MG/3ML) 0.083% nebulizer solution Take 2.5 mg by nebulization every 6 (six) hours as needed.        Marland Kitchen  albuterol (VENTOLIN HFA) 108 (90 BASE) MCG/ACT inhaler Inhale 2 puffs into the lungs every 6 (six) hours as needed.        Marland Kitchen losartan (COZAAR) 50 MG tablet Take 50 mg by mouth daily.        . nitroGLYCERIN (NITROSTAT) 0.4 MG SL tablet Place 0.4 mg under the tongue every 5 (five) minutes as needed.        . pantoprazole (PROTONIX) 40 MG tablet TAKE 1 TABLET BY MOUTH EVERY DAY  30 tablet  0  . PLAVIX 75 MG tablet TAKE 1 TABLET  BY MOUTH EVERY DAY  30 tablet  0  . rosuvastatin (CRESTOR) 5 MG tablet Take 1 tablet (5 mg total) by mouth at bedtime.       BP 168/96  Pulse 68  Resp 16  Ht 5\' 2"  (1.575 m)  Wt 174 lb (78.926 kg)  BMI 31.83 kg/m2 General:  Well developed, well nourished, in no acute distress.  Obese.  Neck:  Neck supple, no JVD. No masses, thyromegaly or abnormal cervical nodes. Lungs:  Clear bilaterally to auscultation and percussion. Heart:  Non-displaced PMI, chest non-tender; regular rate and rhythm, S1, S2 without rubs or gallops. 2/6 early systolic ejection murmur at RUSB.  Carotid upstroke normal, no bruit.  Pedals normal pulses. 1+ left ankle mild edema.  Abdomen:  Bowel sounds positive; abdomen soft and non-tender without masses, organomegaly, or hernias noted. No hepatosplenomegaly. Extremities:  No clubbing or cyanosis. Neurologic:  Alert and oriented x 3. Psych:  Normal affect.

## 2010-12-30 NOTE — Assessment & Plan Note (Signed)
Patient has extensive CAD but no good options for revascularization.  Her EF was preserved without resting wall motion abnormalities on most recent echo in [place] in 3/11.  Myoview in [place] in 3/11 showed no ischemia.  She has not had any chest pain and breathing is much better off beta blockers.  She will continue Plavix and losartan.  Holding off on aspirin as possible contributor to bronchospasm.  She is tolerating Crestor.

## 2010-12-30 NOTE — Assessment & Plan Note (Signed)
BP high in the office today.  She has not taken losartan. Systolic blood pressures have been in the 110s at home.  I question if this is accurate: will have her come into the office with her home cuff to calibrate it.

## 2010-12-30 NOTE — Patient Instructions (Signed)
Lab today--CK 272.0  414.01 401.9  Schedule an appointment for a blood pressure check with the nurse. Bring your blood pressure cuff when you come for that appt.  Take and record your blood pressure about 2 hours after you take your medication. I will call you in about 2 weeks to get the readings.  Schedule an appointment with Dr Shirlee Latch in  4 months.

## 2010-12-31 ENCOUNTER — Ambulatory Visit (INDEPENDENT_AMBULATORY_CARE_PROVIDER_SITE_OTHER): Payer: 59

## 2010-12-31 VITALS — BP 138/87 | HR 68 | Resp 18 | Ht 62.0 in | Wt 174.0 lb

## 2010-12-31 DIAGNOSIS — I10 Essential (primary) hypertension: Secondary | ICD-10-CM

## 2010-12-31 LAB — CK TOTAL AND CKMB (NOT AT ARMC)
CK, MB: 1.7 ng/mL (ref 0.3–4.0)
Total CK: 76 U/L (ref 7–177)

## 2010-12-31 NOTE — Progress Notes (Signed)
Interestingly her machine reads higher than ours.  Would you please ask her to consider getting a new machine and send [place] some readings? Thanks.

## 2010-12-31 NOTE — Progress Notes (Signed)
Pt in for B/P check. B/P taken with the office B/P machine: Left arm 135/91 right arm 138/87 pulse 68 beats/minute, manual B/P 138/82. B/P taken with pt's wrist B/P machine: Left wrist 150/99 right wrist 155/97 patient takes Losartan 50 mg at noon every day. Patient states will start taken the medication in the morning and take her B/P two hours later. She is aware that her wrist B/P machine is not accurately, it needs calibration.

## 2011-01-03 NOTE — Progress Notes (Signed)
I discussed with pt. Pt states she really does not feel she can afford a new BP machine right now. She will go to drugstore about every day and record the readings. I will follow up with her about her BP readings in about 10 days.

## 2011-01-10 ENCOUNTER — Telehealth: Payer: Self-pay | Admitting: Cardiology

## 2011-01-10 NOTE — Telephone Encounter (Signed)
Would like her to restart same dose if no improvement after stopping.  If there is improvement, will decrease Crestor to 5 mg daily.

## 2011-01-10 NOTE — Telephone Encounter (Signed)
Per pt call, pt started taking Crestor again and pt is experiencing joint pain again. Pt was wondering if there was any pain medication pt can take for some relief. Please return pt call to advise/discuss.

## 2011-01-10 NOTE — Telephone Encounter (Signed)
I talked with pt. Pt states starting last Thursday she began experiencing  pain in shoulders, down back and into legs, and stiffness on her joints. Pt feels this is related to Crestor.

## 2011-01-10 NOTE — Telephone Encounter (Signed)
Pt states she has been taking coenzyme Q10 with Crestor. Pt will stop Crestor and coenzyme Q10. She will call in 1 week to let us know if her symptoms have improved. I will forward to Dr Shirlee Latch for review.

## 2011-01-11 ENCOUNTER — Telehealth: Payer: Self-pay | Admitting: *Deleted

## 2011-01-11 NOTE — Telephone Encounter (Signed)
Would like to see BP lower.  Would increase losartan to 100 mg daily with BMET in 2 wks.

## 2011-01-11 NOTE — Telephone Encounter (Signed)
01/03/11 169/73    01/04/11  153/91    01/10/11 137/81  01/11/11 139/71 pt states she feels her BP is higher when she is having back pain. I will forward to Dr Shirlee Latch for review.

## 2011-01-11 NOTE — Telephone Encounter (Signed)
I talked with pt. Pt states she did not take Crestor today and feels her pain is already better. She is aware that Dr Shirlee Latch would like for her try Crestor 5mg  daily if her pain gets better after holding Crestor for 1 week.

## 2011-01-11 NOTE — Telephone Encounter (Signed)
HYPERTENSION - Marca Ancona, MD 12/30/2010 5:15 PM Signed  BP high in the office today. She has not taken losartan. Systolic blood pressures have been in the 110s at home. I question if this is accurate: will have her come into the office with her home cuff to calibrate it.

## 2011-01-11 NOTE — Telephone Encounter (Signed)
LMTCB

## 2011-01-12 ENCOUNTER — Telehealth: Payer: Self-pay | Admitting: *Deleted

## 2011-01-12 DIAGNOSIS — I10 Essential (primary) hypertension: Secondary | ICD-10-CM

## 2011-01-12 DIAGNOSIS — I2510 Atherosclerotic heart disease of native coronary artery without angina pectoris: Secondary | ICD-10-CM

## 2011-01-12 MED ORDER — LOSARTAN POTASSIUM 100 MG PO TABS: 100.0000 mg | ORAL_TABLET | Freq: Every day | ORAL | Status: DC

## 2011-01-12 NOTE — Telephone Encounter (Signed)
I talked with pt. She agreed to increase Losartan to 100mg  daily. She will return for [facility] 01/26/11.

## 2011-01-12 NOTE — Telephone Encounter (Signed)
Katina Dung, RN 01/12/2011 8:24 AM Signed  I talked with pt. She agreed to increase Losartan to 100mg  daily. She will return for [facility] 01/26/11. Marca Ancona, MD 01/11/2011 5:02 PM Signed  Would like to see BP lower. Would increase losartan to 100 mg daily with BMET in 2 wks.  Katina Dung, RN 01/11/2011 3:26 PM Signed  01/03/11 169/73 01/04/11 153/91 01/10/11 137/81 01/11/11 139/71 pt states she feels her BP is higher when she is having back pain. I will forward to Dr Shirlee Latch for review.

## 2011-01-21 ENCOUNTER — Other Ambulatory Visit: Payer: Self-pay | Admitting: *Deleted

## 2011-01-21 DIAGNOSIS — I2510 Atherosclerotic heart disease of native coronary artery without angina pectoris: Secondary | ICD-10-CM

## 2011-01-21 DIAGNOSIS — I10 Essential (primary) hypertension: Secondary | ICD-10-CM

## 2011-01-21 MED ORDER — LOSARTAN POTASSIUM 100 MG PO TABS: 100.0000 mg | ORAL_TABLET | Freq: Every day | ORAL | Status: DC

## 2011-01-21 MED ORDER — PANTOPRAZOLE SODIUM 40 MG PO TBEC: 40.0000 mg | DELAYED_RELEASE_TABLET | Freq: Every day | ORAL | Status: DC

## 2011-01-21 MED ORDER — CLOPIDOGREL BISULFATE 75 MG PO TABS: 75.0000 mg | ORAL_TABLET | Freq: Every day | ORAL | Status: DC

## 2011-01-21 NOTE — Telephone Encounter (Signed)
rx sent to [facility] 

## 2011-01-21 NOTE — Telephone Encounter (Signed)
rx sent to [facility]

## 2011-01-26 ENCOUNTER — Other Ambulatory Visit: Payer: 59 | Admitting: *Deleted

## 2011-01-31 ENCOUNTER — Other Ambulatory Visit (INDEPENDENT_AMBULATORY_CARE_PROVIDER_SITE_OTHER): Payer: 59 | Admitting: *Deleted

## 2011-01-31 DIAGNOSIS — I10 Essential (primary) hypertension: Secondary | ICD-10-CM

## 2011-01-31 DIAGNOSIS — I2510 Atherosclerotic heart disease of native coronary artery without angina pectoris: Secondary | ICD-10-CM

## 2011-01-31 LAB — BASIC METABOLIC PANEL
BUN: 18 mg/dL (ref 6–23)
CO2: 24 mEq/L (ref 19–32)
Calcium: 9.2 mg/dL (ref 8.4–10.5)
Chloride: 109 mEq/L (ref 96–112)
Creatinine, Ser: 0.8 mg/dL (ref 0.4–1.2)
GFR: 93.03 mL/min (ref >60.00)
Glucose, Bld: 101 mg/dL — ABNORMAL HIGH (ref 70–99)
Potassium: 3.7 mEq/L (ref 3.5–5.1)
Sodium: 141 mEq/L (ref 135–145)

## 2011-02-17 ENCOUNTER — Telehealth: Payer: Self-pay | Admitting: Cardiology

## 2011-02-17 NOTE — Telephone Encounter (Signed)
Pt needs refill of crestor 10 mg, call to [facility]

## 2011-02-20 ENCOUNTER — Other Ambulatory Visit: Payer: Self-pay | Admitting: Cardiology

## 2011-02-23 ENCOUNTER — Other Ambulatory Visit: Payer: Self-pay

## 2011-02-23 MED ORDER — ROSUVASTATIN CALCIUM 10 MG PO TABS: 10.0000 mg | ORAL_TABLET | Freq: Every day | ORAL | Status: DC

## 2011-04-25 ENCOUNTER — Ambulatory Visit (INDEPENDENT_AMBULATORY_CARE_PROVIDER_SITE_OTHER): Payer: 59 | Admitting: Adult Health

## 2011-04-25 ENCOUNTER — Ambulatory Visit (INDEPENDENT_AMBULATORY_CARE_PROVIDER_SITE_OTHER)
Admission: RE | Admit: 2011-04-25 | Discharge: 2011-04-25 | Disposition: A | Discharge status: Home / Self Care | Payer: 59 | Source: Ambulatory Visit | Attending: Adult Health | Admitting: Adult Health

## 2011-04-25 ENCOUNTER — Encounter: Payer: Self-pay | Admitting: Adult Health

## 2011-04-25 VITALS — BP 132/88 | HR 75 | Temp 100.1°F | Ht 62.5 in | Wt 185.0 lb

## 2011-04-25 DIAGNOSIS — R059 Cough, unspecified: Secondary | ICD-10-CM

## 2011-04-25 DIAGNOSIS — R05 Cough: Secondary | ICD-10-CM

## 2011-04-25 DIAGNOSIS — J45909 Unspecified asthma, uncomplicated: Secondary | ICD-10-CM

## 2011-04-25 MED ORDER — HYDROCODONE-HOMATROPINE 5-1.5 MG/5ML PO SYRP: 5.0000 mL | ORAL_SOLUTION | Freq: Four times a day (QID) | ORAL | Status: DC | PRN

## 2011-04-25 MED ORDER — PREDNISONE 10 MG PO TABS: ORAL_TABLET | ORAL | Status: DC

## 2011-04-25 MED ORDER — CEFDINIR 300 MG PO CAPS: 300.0000 mg | ORAL_CAPSULE | Freq: Two times a day (BID) | ORAL | Status: DC

## 2011-04-25 NOTE — Patient Instructions (Addendum)
Omnicef 300mg  Twice daily  For 10 days  Mucinex  DM Twice daily  As needed  Cough/congestion  Saline nasal rinses  Fluids and rest  Prednisone taper over next week.  Please contact office for sooner follow up if symptoms do not improve or worsen or seek emergency care   Hydromet 1 tsp every 4-6 hr As needed  Cough -may make you sleepy.  follow up Dr. Vassie Loll  In 6 weeks

## 2011-04-25 NOTE — Progress Notes (Signed)
Subjective:    Patient ID: [name] [name], female    DOB: [birth year], 75 y.o.   MRN: [id number]  HPI 65F never smoker with CAD for evaluation of reactive airway disease - trigger ? beta blockers ? GERD.   10/29/09 - initial  This was of insidious onset - no clear URI symptoms preceding, seemed to get worse while working in her garden & at night - She had an ER visit during a trip to [place] - echoi nml LV fn, BNP 55, stress myoview nml, given cipro for 'bronchitis.'  Review of CXR 08/10/09 shows bibasal interstitial prominence, lt volume loss with raised hemid iaphragm  CT angio chest 8/09 was neg for PE, stable RUL 5mm nodule as compared to 05/2005, no obvious fibrosis. CT sinuses 8/11 nml  She reports good control of heartburn with protonix , had an EGD by Dr Loreta Ave in 11/10  PFTs in 10/10 were essentially nml, no obstruction, nml lung volumes & diffusion.  She had a sleep study at [facility] neurology. Spirometry6/11 shows nml lung function, RAST neg, Labs showed neg ANA, and low ESR. RA factor was elevated.   January 18, 2010  ER visit on 01/09/10 - absolute eos count 800 ,BNP 69, CXR - cardiomegaly, was on prednisone . Diffuse wheezing & coughing today - offered hospital admission but she has grandkids over. Given depot shot   March 01, 2010  Reviewed Dr Ewell Poe notes - favors OA rather than RA, CCP neg, CK was high 709 - muscle cramps, crestor stopped, rpt CK planned for 9/29. Discussed side efects of steroids with her but explained that it is needed for bspasm. Stopped advair x 2 weeks - 'ran out', beta blockers stopped  Underwent EMG,   May 14, 2010  taking advair only once daily , but breathing better  no skin rash  She is needing cortisone shots for her knee every month, lt TKR planned   04/25/2011 Acute OV  Complains of increased SOB, wheezing, prod cough with clear mucus, chills x5days .Has had sinus symptoms with pressure, congestion for 4 weeks. Family has been sick  [place] 4[place]. Never felt she got over and last week cough and congestion started. OTC not helping.  Today cxr with no acute process . She denies any chest pain, hemoptysis, leg swelling, abdominal pain, nausea, vomiting, or recent antibiotic use.   Past Medical History:  1. Coronary artery disease. Left heart catheterization was done in September 2009 because of exertional chest pain. It showed an EF of 60% on left ventriculogram, RCA was totally occluded proximally with left-to-right collaterals, the distal left main had a 20% lesion, there was a small ramus with a 60-70% stenosis, and the mid LAD had serial 60% stenoses with a distal subtotalled LAD. There was a 50% first diagonal stenosis and a 50% first OM stenosis. There were poor distal targets for bypass surgery and no good interventional target so medical management was planned. Myoview in [place] in 3/11 was normal per report.  2. Hypertension.  3. Peptic ulcer disease.  4. Hysterectomy.  5. History of HBV, HCV  6. History of chronic cough. ACEI stopped. Possible contribution from GERD.  7. History of PVCs and PACs.  8. GERD  9. IBS  10. Depression  11. Hyperlipidemia  12. Obesity  13. h/o pancreatitis  14. Osteoporosis  15. Knee osteoarthritis  16. Dyspnea/cough: Echo (3/10): EF 55-60%, no regional wall motion abnormalities, aortic sclerosis. Echo (3/11) in [place]: EF 60%,  grade I diastolic dysfunction, normal RV, no regional wall motion abnormalities, valves are ok. PFTs (10/10): FVC 103%, FEV1 109%, ratio 74%. Mild obstruction. CT chest (6/11) did not show evidence for interstitial lung disease. Possible asthma or upper airways cough syndrome. Aspirin and beta blocker have been held as potential causes for bronchospasm.     Review of Systems Constitutional:   No  weight loss, night sweats,   ++Fevers, chills, ++ fatigue, or  lassitude.  HEENT:   No headaches,  Difficulty swallowing,   Tooth/dental problems, or  Sore throat,                No sneezing, itching, ear ache, nasal congestion, post nasal drip,   CV:  No chest pain,  Orthopnea, PND, swelling in lower extremities, anasarca, dizziness, palpitations, syncope.   GI  No heartburn, indigestion, abdominal pain, nausea, vomiting, diarrhea, change in bowel habits, loss of appetite, bloody stools.   Resp:   No coughing up of blood.    No chest wall deformity  Skin: no rash or lesions.  GU: no dysuria, change in color of urine, no urgency or frequency.  No flank pain, no hematuria   MS:  No joint pain or swelling.  No decreased range of motion.  No back pain.  Psych:  No change in mood or affect. No depression or anxiety.  No memory loss.         Objective:   Physical Exam GEN: A/Ox3; pleasant , NAD, well nourished   HEENT:  [place]/AT,  EACs-clear, TMs-wnl, NOSE-clear drainage , max sinus tenderness , THROAT-clear, no lesions, no postnasal drip or exudate noted.   NECK:  Supple w/ fair ROM; no JVD; normal carotid impulses w/o bruits; no thyromegaly or nodules palpated; no lymphadenopathy.  RESP  Coarse BS w/ faint exp wheeze  no accessory muscle use, no dullness to percussion  CARD:  RRR, no m/r/g  , no peripheral edema, pulses intact, no cyanosis or clubbing.  GI:   Soft & nt; nml bowel sounds; no organomegaly or masses detected.  Musco: Warm bil, no deformities or joint swelling noted.   Neuro: alert, no focal deficits noted.    Skin: Warm, no lesions or rashes        Assessment & Plan:

## 2011-04-25 NOTE — Assessment & Plan Note (Signed)
Flare with associated sinusitis and bronchitis   Plan  Omnicef 300mg  Twice daily  For 10 days  Mucinex  DM Twice daily  As needed  Cough/congestion  Saline nasal rinses  Fluids and rest  Prednisone taper over next week.  Please contact office for sooner follow up if symptoms do not improve or worsen or seek emergency care   Hydromet 1 tsp every 4-6 hr As needed  Cough -may make you sleepy.  follow up Dr. Vassie Loll  In 6 weeks

## 2011-05-03 ENCOUNTER — Encounter: Payer: Self-pay | Admitting: Cardiology

## 2011-05-03 ENCOUNTER — Ambulatory Visit (INDEPENDENT_AMBULATORY_CARE_PROVIDER_SITE_OTHER): Payer: 59 | Admitting: Cardiology

## 2011-05-03 DIAGNOSIS — I2510 Atherosclerotic heart disease of native coronary artery without angina pectoris: Secondary | ICD-10-CM

## 2011-05-03 DIAGNOSIS — I10 Essential (primary) hypertension: Secondary | ICD-10-CM

## 2011-05-03 DIAGNOSIS — E785 Hyperlipidemia, unspecified: Secondary | ICD-10-CM

## 2011-05-03 NOTE — Assessment & Plan Note (Signed)
Check lipids/LFTs in 1/13, goal LDL < 70.  

## 2011-05-03 NOTE — Patient Instructions (Signed)
Schedule an appointment for a 24 hour blood pressure monitor.  Your physician recommends that you return for a FASTING lipid profile in January 2013--414.01   Your physician recommends that you schedule a follow-up appointment in: 4 months with Dr Shirlee Latch.

## 2011-05-03 NOTE — Progress Notes (Signed)
PCP: Eloise Harman  75 yo with history of CAD, asthma, and osteoarthritis presents for cardiology followup.  Patient had a long workup for bronchospasm with Dr. Vassie Loll.  It now appears that she is very sensitive to beta blockers, with bronchospasm/dyspnea with even the most beta-1 selective agents.  She is, therefore, no longer on a beta blocker.  Her aspirin was also stopped due to concern that it could have been contrbuting to bronchospasm.    Since I last saw her, she has been stable.  Her knees are feeling better and she is working out at [employer] 4 days a week on a stationary bike and the treadmill.  She has been having epigastric pain and belching on and off for the last day after eating scrambled eggs and bacon.  The pain is relieved when she belches.  No chest pain with exertion or at rest.  No significant exertional dyspnea.  She is tolerating Crestor without problems.  BP is high today.  She continues to tell me that it is in normal range whenever she checks it outside this office (now getting it checked at a pharmacy).    ECG: NSR, 1st degree AV block, old inferior MI  Labs (6/11): LDL 101, HDL 53 Labs (9/11): K 4.6, creatinine 0.9 Labs (7/12): LDL 82, HDL 57, LFTs normal Labs (1/61): K 3.7, creatinine 0.8  Allergies (verified):  1)  ! Pcn 2)  ! Biaxin 3)  ! * Pravastatin 4)  * Contrast Dye  Past Medical History: 1. Coronary artery disease.  Left heart catheterization was done in September 2009 because of exertional chest pain.  It showed an EF of 60% on left ventriculogram, RCA was totally occluded proximally with left-to-right collaterals, the distal left main had a 20% lesion, there was a small ramus with a 60-70% stenosis, and the mid LAD had  serial 60% stenoses with a distal subtotalled LAD.  There was a 50% first diagonal stenosis and a 50% first OM stenosis. There were poor distal targets for bypass surgery and no good interventional target so medical management was planned. Myoview  in [place] in 3/11 was normal per report.  2. Hypertension. 3. Peptic ulcer disease. 4. Hysterectomy. 5. History of  HBV, HCV 6. History of chronic cough.  ACEI stopped.  Possible contribution from GERD. 7. History of PVCs and PACs. 8. GERD 9. IBS 10. Depression 11. Hyperlipidemia: CPK elevated while on Crestor.  She thinks pravastatin caused a rash.  12. Obesity 13. h/o pancreatitis 14. Osteoporosis 15. Knee osteoarthritis s/p left TKR.  16. Dyspnea/cough: Echo (3/10): EF 55-60%, no regional wall motion abnormalities, aortic sclerosis.  Echo (3/11) in [place]: EF 60%, grade I diastolic dysfunction, normal RV, no regional wall motion abnormalities, valves are ok.  PFTs (10/10): FVC 103%, FEV1 109%, ratio 74%.  Mild obstruction.  CT chest (6/11) did not show evidence for interstitial lung disease.  Possible asthma or upper airways cough syndrome.  Aspirin and beta blocker have been held as potential causes for bronchospasm.    Family History: Family History Asthma-sister, mother Family History Emphysema-sister Family History Rheumatoid Arthritis-mother Family History Colon Cancer-maternal aunt Family History Lung Cancer-maternal aunt Pancreatic Cancer-brother Family History C V A / Stroke -mother  Social History: The patient does not drink or smoke (never smoked).   Marital Status:Married lives with husband  Children: Yes Occupation: Retired [employer]  Patient never smoked. (2nd hand smoke exposure x 40 years)  Review of Systems  All systems reviewed and negative except as per HPI.   Current Outpatient Prescriptions  Medication Sig Dispense Refill  . albuterol (VENTOLIN HFA) 108 (90 BASE) MCG/ACT inhaler Inhale 2 puffs into the lungs every 6 (six) hours as needed.        . clopidogrel (PLAVIX) 75 MG tablet Take 1 tablet (75 mg total) by mouth daily.  90 tablet  3  . losartan (COZAAR) 100 MG tablet Take 1 tablet (100 mg total) by mouth daily.  90  tablet  3  . nitroGLYCERIN (NITROSTAT) 0.4 MG SL tablet Place 0.4 mg under the tongue every 5 (five) minutes as needed.        . pantoprazole (PROTONIX) 40 MG tablet Take 1 tablet (40 mg total) by mouth daily.  90 tablet  3  . predniSONE (DELTASONE) 10 MG tablet as needed. 4 tabs for 2 days, then 3 tabs for 2 days, 2 tabs for 2 days, then 1 tab for 2 days, then stop       . rosuvastatin (CRESTOR) 10 MG tablet Take 1 tablet (10 mg total) by mouth at bedtime.  90 tablet  3  . albuterol (PROVENTIL) (2.5 MG/3ML) 0.083% nebulizer solution Take 2.5 mg by nebulization every 6 (six) hours as needed.         BP 140/100  Pulse 64  Ht 5\' 2"  (1.575 m)  Wt 83.008 kg (183 lb)  BMI 33.47 kg/m2 General:  Well developed, well nourished, in no acute distress.  Obese.  Neck:  Neck supple, no JVD. No masses, thyromegaly or abnormal cervical nodes. Lungs:  Clear bilaterally to auscultation and percussion. Heart:  Non-displaced PMI, chest non-tender; regular rate and rhythm, S1, S2 without rubs or gallops. 1/6 early systolic ejection murmur at RUSB.  Carotid upstroke normal, no bruit.  Pedals normal pulses. No edema. Abdomen:  Bowel sounds positive; abdomen soft and non-tender without masses, organomegaly, or hernias noted. No hepatosplenomegaly. Extremities:  No clubbing or cyanosis. Neurologic:  Alert and oriented x 3. Psych:  Normal affect.

## 2011-05-03 NOTE — Assessment & Plan Note (Signed)
Patient has extensive CAD but no good options for revascularization.  Her EF was preserved without resting wall motion abnormalities on most recent echo in [place] in 3/11.  Myoview in [place] in 3/11 showed no ischemia.  She has not had any chest pain and breathing is much better off beta blockers.  She will continue Plavix and losartan.  Holding off on aspirin as possible contributor to bronchospasm.  She is tolerating Crestor.  

## 2011-05-03 NOTE — Assessment & Plan Note (Signed)
BP runs high in this office whenever I check it.  BP has been normal when she checks it at [facility].  I am going to have her wear a 24 hour ambulatory blood pressure monitor to get some objective idea of what her pressure runs when not in this office.  If it is high, I will start her ton amlodipine 5 mg daily.

## 2011-05-06 ENCOUNTER — Encounter (INDEPENDENT_AMBULATORY_CARE_PROVIDER_SITE_OTHER): Payer: 59

## 2011-05-06 DIAGNOSIS — R030 Elevated blood-pressure reading, without diagnosis of hypertension: Secondary | ICD-10-CM

## 2011-05-23 ENCOUNTER — Other Ambulatory Visit: Payer: Self-pay | Admitting: *Deleted

## 2011-05-23 ENCOUNTER — Telehealth: Payer: Self-pay | Admitting: Pulmonary Disease

## 2011-05-23 DIAGNOSIS — R0602 Shortness of breath: Secondary | ICD-10-CM

## 2011-05-23 DIAGNOSIS — R059 Cough, unspecified: Secondary | ICD-10-CM

## 2011-05-23 DIAGNOSIS — R05 Cough: Secondary | ICD-10-CM

## 2011-05-23 NOTE — Telephone Encounter (Signed)
I spoke with the patient and she last saw TP on 04-25-11 and was given omnicef and prednisone taper. She states she felt better after that OV, but x 1 week she has been having increased SOB, productive cough with yellow phlegm, wheezing, body aches. She denies chest tightness. She states she is sleeping sitting up because her sob and cough is much worse lying flat. Pt has an OV with Dr. Vassie Loll on 06-06-11. Pt offered appt with TP but she refuses stating she only wants to see Dr. Vassie Loll. Dr. Vassie Loll is not back in the office until Thursday. Please advise on recs. Thanks. Carron Curie, CMA Allergies  Allergen Reactions  . Clarithromycin   . Penicillins   . Pravastatin     REACTION: itching

## 2011-05-23 NOTE — Telephone Encounter (Signed)
Pt returned call from triage. [name] [name] °

## 2011-05-23 NOTE — Telephone Encounter (Signed)
[email or web address] Gargis, CMA  

## 2011-05-23 NOTE — Telephone Encounter (Signed)
Offer her visit at [facility] tomorrow - if not able, then have her come to [facility] for CXR  & doublebook on thursday

## 2011-05-23 NOTE — Telephone Encounter (Signed)
Pt will go to [facility] tomorrow at 9am. Carron Curie, CMA

## 2011-05-24 ENCOUNTER — Ambulatory Visit (INDEPENDENT_AMBULATORY_CARE_PROVIDER_SITE_OTHER): Payer: 59 | Admitting: Pulmonary Disease

## 2011-05-24 ENCOUNTER — Encounter: Payer: Self-pay | Admitting: Pulmonary Disease

## 2011-05-24 ENCOUNTER — Ambulatory Visit (HOSPITAL_BASED_OUTPATIENT_CLINIC_OR_DEPARTMENT_OTHER)
Admission: RE | Admit: 2011-05-24 | Discharge: 2011-05-24 | Disposition: A | Discharge status: Home / Self Care | Payer: 59 | Source: Ambulatory Visit | Attending: Pulmonary Disease | Admitting: Pulmonary Disease

## 2011-05-24 VITALS — BP 140/80 | HR 66 | Temp 98.3°F | Ht 62.5 in | Wt 186.0 lb

## 2011-05-24 DIAGNOSIS — R059 Cough, unspecified: Secondary | ICD-10-CM

## 2011-05-24 DIAGNOSIS — I77810 Thoracic aortic ectasia: Secondary | ICD-10-CM | POA: Insufficient documentation

## 2011-05-24 DIAGNOSIS — R05 Cough: Secondary | ICD-10-CM

## 2011-05-24 DIAGNOSIS — R062 Wheezing: Secondary | ICD-10-CM

## 2011-05-24 DIAGNOSIS — J45909 Unspecified asthma, uncomplicated: Secondary | ICD-10-CM

## 2011-05-24 DIAGNOSIS — I517 Cardiomegaly: Secondary | ICD-10-CM

## 2011-05-24 DIAGNOSIS — R0602 Shortness of breath: Secondary | ICD-10-CM

## 2011-05-24 DIAGNOSIS — K219 Gastro-esophageal reflux disease without esophagitis: Secondary | ICD-10-CM

## 2011-05-24 MED ORDER — FLUTICASONE-SALMETEROL 250-50 MCG/DOSE IN AEPB: 1.0000 | INHALATION_SPRAY | Freq: Two times a day (BID) | RESPIRATORY_TRACT | Status: DC

## 2011-05-24 MED ORDER — PREDNISONE 10 MG PO TABS: ORAL_TABLET | ORAL | Status: DC

## 2011-05-24 NOTE — Assessment & Plan Note (Signed)
Treat with PPI

## 2011-05-24 NOTE — Assessment & Plan Note (Addendum)
Unclear trigger - no obvious GERd, beta blockade stopped She seems to have flared up after almost a year - again with unknown trigger. Will use prednisone course x 2 weeks followed by advair 250/50 bid x 3-6 months to suppress inflammation.

## 2011-05-24 NOTE — Patient Instructions (Signed)
Advair sample & Rx - 250/50 1 puff twice daily - stay on this until next visit Prednisone Take 4 tabs  daily with food x 4 days, then 3 tabs daily x 4 days, then 2 tabs daily x 4 days, then 1 tab daily x9days then stop. #45

## 2011-05-24 NOTE — Progress Notes (Signed)
Subjective:    Patient ID: [name] [name], female    DOB: 06/12/1935, 75 y.o.   MRN: [id number]  HPI 34F never smoker with CAD for FU of reactive airway disease - trigger  ? GERD.  10/29/09 - initial  This was of insidious onset - no clear URI symptoms preceding, seemed to get worse while working in her garden & at night - She had an ER visit during a trip to [place] - echoi nml LV fn, BNP 55, stress myoview nml, given cipro for 'bronchitis.'  Review of CXR 08/10/09 shows bibasal interstitial prominence, lt volume loss with raised hemidiaphragm  CT angio chest 8/09 was neg for PE, stable RUL 5mm nodule as compared to 05/2005, no obvious fibrosis. CT sinuses 8/11 nml  She reports good control of heartburn with protonix , had an EGD by Dr Loreta Ave in 11/10  PFTs in 10/10 were essentially nml, no obstruction, nml lung volumes & diffusion.  She had a sleep study at [facility] neurology. Spirometry6/11 shows nml lung function, RAST neg, Labs showed neg ANA, and low ESR. RA factor was elevated.  ER visit on 01/09/10 - absolute eos count 800 ,BNP 69, CXR - cardiomegaly, was on prednisone .   Sep, 2011 >> Referred to Rheum Dareen Piano) - favors OA rather than RA, CCP neg, CK was high 709 - muscle cramps, crestor stopped Stopped advair x 2 weeks - 'ran out', beta blockers stopped  Underwent EMG,      05/24/2011 Underwent Lt TKR 2/12 Self dc'd advair 04/25/2011 Acute OV  -Increased SOB, wheezing, prod cough with clear mucus, chills x5days .Had sinus symptoms with pressure, congestion for 4 weeks. [place]. Never felt she got over and last week cough and congestion started. OTC not helping.  cxr with no acute process .  Last prednisone was 2 ds ago    Past Medical History:  1. Coronary artery disease. Left heart catheterization was done in September 2009 because of exertional chest pain. It showed an EF of 60% on left ventriculogram, RCA was totally  occluded proximally with left-to-right collaterals>> There were poor distal targets for bypass surgery and no good interventional target so medical management was planned. Myoview in [place] in 3/11 was normal per report.  2. Hypertension.  3. Peptic ulcer disease.  4. Hysterectomy.  5. History of HBV, HCV  6. History of chronic cough. ACEI stopped. Possible contribution from GERD.  7. History of PVCs and PACs.  8. GERD  9. IBS  10. Depression  11. Hyperlipidemia  12. Obesity  13. h/o pancreatitis  14. Osteoporosis  15. Knee osteoarthritis  16. Dyspnea/cough: Echo (3/10): EF 55-60%, no regional wall motion abnormalities, aortic sclerosis. Echo (3/11) in [place]: EF 60%, grade I diastolic dysfunction, normal RV, no regional wall motion abnormalities, valves are ok. PFTs (10/10): FVC 103%, FEV1 109%, ratio 74%. Mild obstruction. CT chest (6/11) did not show evidence for interstitial lung disease.    Review of Systems She denies any chest pain, hemoptysis, leg swelling, abdominal pain, nausea, vomiting, Patient denies significant dyspnea,cough, hemoptysis,  chest pain, palpitations, pedal edema, orthopnea, paroxysmal nocturnal dyspnea, lightheadedness, nausea, vomiting, abdominal or  leg pains      Objective:   Physical Exam  Gen. Pleasant, well-nourished, in no distress ENT - no lesions, no post nasal drip Neck: No JVD, no thyromegaly, no carotid bruits Lungs: no use of accessory muscles, no dullness to percussion,no rales, faint rhonchi  Cardiovascular:  Rhythm regular, heart sounds  normal, no murmurs or gallops, no peripheral edema Musculoskeletal: No deformities, no cyanosis or clubbing         Assessment & Plan:

## 2011-06-06 ENCOUNTER — Ambulatory Visit: Payer: 59 | Admitting: Pulmonary Disease

## 2011-06-22 ENCOUNTER — Ambulatory Visit: Payer: 59 | Admitting: Pulmonary Disease

## 2011-06-23 ENCOUNTER — Encounter: Payer: Self-pay | Admitting: Pulmonary Disease

## 2011-06-23 ENCOUNTER — Ambulatory Visit (INDEPENDENT_AMBULATORY_CARE_PROVIDER_SITE_OTHER): Payer: 59 | Admitting: Pulmonary Disease

## 2011-06-23 VITALS — BP 122/82 | HR 95 | Temp 98.3°F | Ht 62.5 in | Wt 189.2 lb

## 2011-06-23 DIAGNOSIS — J45909 Unspecified asthma, uncomplicated: Secondary | ICD-10-CM

## 2011-06-23 NOTE — Patient Instructions (Signed)
Please take your advair 250/50 twice daily - RINSE mouth after Finish taking prednisone Call if no better

## 2011-06-23 NOTE — Progress Notes (Signed)
  Subjective:    Patient ID: [name] [name], female    DOB: [birth year], 76 y.o.   MRN: [id number]  HPI 32F never smoker with CAD for FU of reactive airway disease - trigger ? GERD.  10/29/09 - initial  This was of insidious onset - - She had an ER visit during a trip to [place] - echoi nml LV fn, BNP 55, stress myoview nml, given cipro for 'bronchitis.'  Review of CXR 08/10/09 shows bibasal interstitial prominence, lt volume loss with raised hemidiaphragm  CT angio chest 8/09 was neg for PE, stable RUL 5mm nodule as compared to 05/2005, no obvious fibrosis. CT sinuses 8/11 nml  She reports good control of heartburn with protonix , had an EGD by Dr Loreta Ave in 11/10  PFTs in 10/10 were essentially nml, no obstruction, nml lung volumes & diffusion.  She had a sleep study at [facility] neurology. Spirometry6/11 shows nml lung function, RAST neg, Labs showed neg ANA, and low ESR. RA factor was elevated.  ER visit on 01/09/10 - absolute eos count 800 ,BNP 69, CXR - cardiomegaly, was on prednisone .  Sep, 2011 >> Referred to Rheum Dareen Piano) - favors OA rather than RA, CCP neg, CK was high 709 - muscle cramps, crestor stopped Stopped advair x 2 weeks - 'ran out', beta blockers stopped  Underwent EMG,    06/23/2011 Self dc'd advair again, copay of $33, Cardiac meds too expensive Takes prednisone sporadically rather than as prescribed, has run out of albuterol rescue inhaler Required prednisone tapers frequently last few months    Past Medical History:  1. Coronary artery disease. Left heart catheterization was done in September 2009 because of exertional chest pain. It showed an EF of 60% on left ventriculogram, RCA was totally occluded proximally with left-to-right collaterals>> There were poor distal targets for bypass surgery and no good interventional target so medical management was planned. Myoview in [place] in 3/11 was normal per report.  2. Hypertension.  3. Peptic ulcer disease.  4.  Hysterectomy.  5. History of HBV, HCV  6. History of chronic cough. ACEI stopped. Possible contribution from GERD.  7. History of PVCs and PACs.  8. GERD  9. IBS  10. Depression  11. Hyperlipidemia  12. Obesity  13. h/o pancreatitis  14. Osteoporosis  15. Knee osteoarthritis  16. Dyspnea/cough: Echo (3/10): EF 55-60%, no regional wall motion abnormalities, aortic sclerosis. Echo (3/11) in [place]: EF 60%, grade I diastolic dysfunction, normal RV, no regional wall motion abnormalities, valves are ok. PFTs (10/10): FVC 103%, FEV1 109%, ratio 74%. Mild obstruction. CT chest (6/11) did not show evidence for interstitial lung disease.     Review of Systems Patient denies significant cough, hemoptysis,  chest pain, palpitations, pedal edema, orthopnea, paroxysmal nocturnal dyspnea, lightheadedness, nausea, vomiting, abdominal or  leg pains      Objective:   Physical Exam  Gen. Pleasant, well-nourished, in no distress ENT - no lesions, no post nasal drip Neck: No JVD, no thyromegaly, no carotid bruits Lungs: no use of accessory muscles, no dullness to percussion,Bl coarse rhonchi  Cardiovascular: Rhythm regular, heart sounds  normal, no murmurs or gallops, no peripheral edema Musculoskeletal: No deformities, no cyanosis or clubbing        Assessment & Plan:

## 2011-06-23 NOTE — Assessment & Plan Note (Signed)
She appears to be poorly compliant with advair & prednisone I admonished her today - perhaps it would help with her med copays if her cardiac meds could be substituted to generic ones. Given advair samples - the copay on this seems ot be low enough & explained importance of maintenance medication for persistent symptoms. Use albuterol MDI for rescue. She also has albuterol nebs for an emergency She will complete another 2 days of  Prednisone & see [place] back in 4 wks

## 2011-07-08 ENCOUNTER — Telehealth: Payer: Self-pay | Admitting: Cardiology

## 2011-07-08 NOTE — Telephone Encounter (Signed)
Fu call °Patient returning your call °

## 2011-07-08 NOTE — Telephone Encounter (Signed)
LMTCB

## 2011-07-08 NOTE — Telephone Encounter (Signed)
Pt calling re problem with med losartin, plavix and crestor, when takes it gets chest and stomach pain, didn't take it yesterday and didn't have the pain

## 2011-07-11 NOTE — Telephone Encounter (Signed)
LMTCB

## 2011-07-14 NOTE — Telephone Encounter (Signed)
I talked with pt. Pt states in the last couple of weeks when she exercises she gets an extreme tiredness in the middle of her  chest. She states she will stop exercising  and feels better. She does note feeling like pills get stuck in her throat and having chest pain for a few days. She also mentioned an increase in SOB. I reviewed with Dr Shirlee Latch. I have given pt appt 07/15/11 at 3pm with Sunday Spillers.

## 2011-07-15 ENCOUNTER — Ambulatory Visit (INDEPENDENT_AMBULATORY_CARE_PROVIDER_SITE_OTHER): Payer: 59 | Admitting: Nurse Practitioner

## 2011-07-15 ENCOUNTER — Encounter: Payer: Self-pay | Admitting: Nurse Practitioner

## 2011-07-15 DIAGNOSIS — R079 Chest pain, unspecified: Secondary | ICD-10-CM

## 2011-07-15 DIAGNOSIS — I10 Essential (primary) hypertension: Secondary | ICD-10-CM

## 2011-07-15 DIAGNOSIS — I2510 Atherosclerotic heart disease of native coronary artery without angina pectoris: Secondary | ICD-10-CM

## 2011-07-15 MED ORDER — AMLODIPINE BESYLATE 5 MG PO TABS: 5.0000 mg | ORAL_TABLET | Freq: Every day | ORAL | Status: DC

## 2011-07-15 NOTE — Assessment & Plan Note (Signed)
Patient presents with symptoms that sound like angina. Does have some associated GI complaints as well. Her cath from 2009 showed no distal targets for CABG and no lesions amenable to PCI. She does have room for further medical management. Will add Norvasc 5 mg daily. Will go ahead and update her stress test as well. She may require repeat cardiac cath. She does have NTG on hand. Patient is agreeable to this plan and will call if any problems develop in the interim.

## 2011-07-15 NOTE — Progress Notes (Signed)
[name] [name] Date of Birth: Sep 22, 1935 Medical Record #[id number]  History of Present Illness: [name] [name] is seen today for a work in visit. She is seen for Dr. Shirlee Latch. She is a 76 year old female with multiple medical problems. She has known CAD and is felt to best be managed medically since her cath back in 2009. She had poor distal targets for bypass surgery and no lesion amenable to PCI. Her last Myoview was in 2011 and was reported as normal. Her other problems include significant bronchospasm. She is not able to take beta blockers and is not on aspirin because of the bronchospasm. She also has HTN, PUD, GERD, IBS, obesity and PVC/PAC's.   She comes in today. She had called yesterday and spoke to Dr. Alford Highland nurse. She has noted that over the 2 weeks she felt exhausted with just doing her household chores. She would have to lie down. She has tried to get on the treadmill and notes the same sensation as well as some burning in the middle of her chest. She feels better when she stops to rest. Some increasing shortness of breath. ? Dysphagia reported. She felt like her pills were getting stuck in her throat. Noted that her chest and stomach hurts if she takes losartan, plavix and crestor at the same time. She split them up yesterday and felt better. Today she feels fine. She has stopped exercising. She is anxious and worried. Has not used any NTG. Symptoms have been promptly relieved with rest.  Current Outpatient Prescriptions on File Prior to Visit  Medication Sig Dispense Refill  . albuterol (PROVENTIL) (2.5 MG/3ML) 0.083% nebulizer solution Take 2.5 mg by nebulization every 6 (six) hours as needed.        Marland Kitchen albuterol (VENTOLIN HFA) 108 (90 BASE) MCG/ACT inhaler Inhale 2 puffs into the lungs every 6 (six) hours as needed.        . clopidogrel (PLAVIX) 75 MG tablet Take 1 tablet (75 mg total) by mouth daily.  90 tablet  3  . Fluticasone-Salmeterol (ADVAIR DISKUS) 250-50 MCG/DOSE AEPB  Inhale 1 puff into the lungs 2 (two) times daily.  1 each  5  . losartan (COZAAR) 100 MG tablet Take 1 tablet (100 mg total) by mouth daily.  90 tablet  3  . nitroGLYCERIN (NITROSTAT) 0.4 MG SL tablet Place 0.4 mg under the tongue every 5 (five) minutes as needed.        . pantoprazole (PROTONIX) 40 MG tablet Take 40 mg by mouth daily as needed.      . predniSONE (DELTASONE) 10 MG tablet as needed. 4 tabs for 2 days, then 3 tabs for 2 days, 2 tabs for 2 days, then 1 tab for 2 days, then stop       . DISCONTD: predniSONE (DELTASONE) 10 MG tablet Take 4 tabs  daily with food x 4 days, then 3 tabs daily x 4 days, then 2 tabs daily x 4 days, then 1 tab daily x9days then stop. #45  45 tablet  0    Allergies  Allergen Reactions  . Clarithromycin   . Penicillins   . Pravastatin     REACTION: itching    Past Medical History  Diagnosis Date  . CAD (coronary artery disease)     L heart cath done 9/09 - exertional chest pain. EF 60%, RCA totally occluded w/L-to-R collateral, distal main had a 20% lesion. small ramus with a 60-70% stenosis, mid LAD had serial 60% stenosis with  a distal subtotalled LAD. 50% 1st diagonal and 50% 1st OM stenosis. poor distal targets for bypass surgery/no good interventional target - medical managment planned. Myoview in [place] in 3/11 -nml  . HTN (hypertension)   . PUD (peptic ulcer disease)   . HBV (hepatitis B virus) infection   . HCV (hepatitis C virus)   . Chronic cough     hx; ACEI stoped, possible contribution from GERD  . PVC (premature ventricular contraction)     hx  . PAC (premature atrial contraction)     hx  . GERD (gastroesophageal reflux disease)   . IBS (irritable bowel syndrome)   . Depression   . HLD (hyperlipidemia)     unable to tolaerate crestor due to myalgias and elevated CK. thinks pravastatin casued a rash.   . Obesity   . Pancreatitis     hx  . Osteoporosis   . Osteoarthritis     knee; s/p TKR  . Dyspnea     EF 55-60%, no regional wall  motion abnormalities, aortic sclerosis. echo (3/11) in [place]: EF 60%, grade 1 diastolic dysfunction, normal RV, no regional wall motion abntml. valves ok. PFTs (10/10): FVC 103%, ratio 74%. mild obstruction. CT chest (6/11) no evidence for interstitial lung dz. possible asthma/upper airway cough sydrome. aspirin & beta blockers- potential causes for bronchospasm.     Past Surgical History  Procedure Date  . Cholecystectomy   . Hyesterectomy, unspec. area   . Pancreatic duct stent   . Tubal ligation 1995   . Cardiac catheterization 2009    History  Smoking status  . Never Smoker   Smokeless tobacco  . Never Used  Comment: 2nd hand exposure x40 years     History  Alcohol Use No    Family History  Problem Relation Age of Onset  . Asthma Mother   . Arthritis Mother     rheumatiod arthritis  . Stroke Mother   . Asthma Sister   . Emphysema Sister   . Colon cancer Maternal Aunt   . Lung cancer Maternal Aunt   . Pancreatic cancer Brother     Review of Systems: The review of systems is per the HPI.  She says her blood pressure is better at home. Seems to always be higher here. Trying to locate her 24 hour BP results from November. No swelling. Some shortness of breath. No cough. Says she had a GI bug one week ago that is now resolved. All other systems were reviewed and are negative.  Physical Exam: BP 142/94  Pulse 71  Ht 5\' 2"  (1.575 m)  Wt 187 lb 9.6 oz (85.095 kg)  BMI 34.31 kg/m2  SpO2 98% Patient is very pleasant and in no acute distress. Skin is warm and dry. Color is normal.  HEENT is unremarkable. Normocephalic/atraumatic. PERRL. Sclera are nonicteric. Neck is supple. No masses. No JVD. Lungs are clear. Cardiac exam shows a regular rate and rhythm. Very soft systolic murmur noted. Abdomen is obese but soft. Extremities are without edema. Gait and ROM are intact. No gross neurologic deficits noted.   LABORATORY DATA: EKG today shows sinus rhythm,1st degree AV block;  inferior Q's. No acute changes and appears unchanged.    Assessment / Plan:

## 2011-07-15 NOTE — Assessment & Plan Note (Signed)
Blood pressure is up here today. Trying to locate her 24 hour BP assessment from November. I am adding Norvasc 5 mg today to help with her angina as well.

## 2011-07-15 NOTE — Patient Instructions (Signed)
We are going to arrange for a stress test next week.  We are going to add Norvasc 5 mg daily. This should help with your symptoms.  Use your NTG under your tongue for recurrent chest pain. May take one tablet every 5 minutes. If you are still having discomfort after 3 tablets in 15 minutes, call 911.  We will be in touch and let you know what the next step is going to be. For now, keep your next appointment with Dr. Shirlee Latch.  Call the [facility] office at [phone] if you have any questions, problems or concerns.

## 2011-07-25 ENCOUNTER — Encounter: Payer: Self-pay | Admitting: Adult Health

## 2011-07-25 ENCOUNTER — Ambulatory Visit (INDEPENDENT_AMBULATORY_CARE_PROVIDER_SITE_OTHER): Payer: 59 | Admitting: Adult Health

## 2011-07-25 ENCOUNTER — Ambulatory Visit (HOSPITAL_COMMUNITY): Payer: Medicare Other | Attending: Cardiology | Admitting: Radiology

## 2011-07-25 VITALS — BP 128/84 | HR 76 | Temp 98.0°F | Ht 62.0 in | Wt 188.2 lb

## 2011-07-25 DIAGNOSIS — R079 Chest pain, unspecified: Secondary | ICD-10-CM

## 2011-07-25 DIAGNOSIS — I2510 Atherosclerotic heart disease of native coronary artery without angina pectoris: Secondary | ICD-10-CM

## 2011-07-25 DIAGNOSIS — R5383 Other fatigue: Secondary | ICD-10-CM | POA: Insufficient documentation

## 2011-07-25 DIAGNOSIS — J45909 Unspecified asthma, uncomplicated: Secondary | ICD-10-CM

## 2011-07-25 DIAGNOSIS — R5381 Other malaise: Secondary | ICD-10-CM | POA: Insufficient documentation

## 2011-07-25 DIAGNOSIS — R0989 Other specified symptoms and signs involving the circulatory and respiratory systems: Secondary | ICD-10-CM | POA: Insufficient documentation

## 2011-07-25 DIAGNOSIS — E669 Obesity, unspecified: Secondary | ICD-10-CM | POA: Insufficient documentation

## 2011-07-25 DIAGNOSIS — I10 Essential (primary) hypertension: Secondary | ICD-10-CM | POA: Insufficient documentation

## 2011-07-25 DIAGNOSIS — E785 Hyperlipidemia, unspecified: Secondary | ICD-10-CM | POA: Insufficient documentation

## 2011-07-25 DIAGNOSIS — R0602 Shortness of breath: Secondary | ICD-10-CM

## 2011-07-25 DIAGNOSIS — R0609 Other forms of dyspnea: Secondary | ICD-10-CM | POA: Insufficient documentation

## 2011-07-25 MED ORDER — TECHNETIUM TC 99M TETROFOSMIN IV KIT
10.0000 | PACK | Freq: Once | INTRAVENOUS | Status: AC | PRN
  Administered 2011-07-25: 10 via INTRAVENOUS

## 2011-07-25 MED ORDER — REGADENOSON 0.4 MG/5ML IV SOLN
0.4000 mg | Freq: Once | INTRAVENOUS | Status: AC
  Administered 2011-07-25: 0.4 mg via INTRAVENOUS

## 2011-07-25 MED ORDER — TECHNETIUM TC 99M TETROFOSMIN IV KIT
30.0000 | PACK | Freq: Once | INTRAVENOUS | Status: AC | PRN
  Administered 2011-07-25: 30 via INTRAVENOUS

## 2011-07-25 NOTE — Progress Notes (Signed)
Subjective:    Patient ID: [name] [name], female    DOB: [birth year], 76 y.o.   MRN: [id number]  HPI  69F never smoker with CAD for FU of reactive airway disease - trigger ? GERD.  10/29/09 - initial  This was of insidious onset - - She had an ER visit during a trip to [place] - echoi nml LV fn, BNP 55, stress myoview nml, given cipro for 'bronchitis.'  Review of CXR 08/10/09 shows bibasal interstitial prominence, lt volume loss with raised hemidiaphragm  CT angio chest 8/09 was neg for PE, stable RUL 5mm nodule as compared to 05/2005, no obvious fibrosis. CT sinuses 8/11 nml  She reports good control of heartburn with protonix , had an EGD by Dr Loreta Ave in 11/10  PFTs in 10/10 were essentially nml, no obstruction, nml lung volumes & diffusion.  She had a sleep study at [facility] neurology. Spirometry6/11 shows nml lung function, RAST neg, Labs showed neg ANA, and low ESR. RA factor was elevated.  ER visit on 01/09/10 - absolute eos count 800 ,BNP 69, CXR - cardiomegaly, was on prednisone .  Sep, 2011 >> Referred to Rheum Dareen Piano) - favors OA rather than RA, CCP neg, CK was high 709 - muscle cramps, crestor stopped Stopped advair x 2 weeks - 'ran out', beta blockers stopped  Underwent EMG,    06/13/11  Self dc'd advair again, copay of $33, Cardiac meds too expensive Takes prednisone sporadically rather than as prescribed, has run out of albuterol rescue inhaler Required prednisone tapers frequently last few months >>no changes   07/25/2011 Follow up  Returns for follow up . Says she has been doing well since last ov with her breathing. No flare in symptoms , no steroid use . No increased SABA use.  Does have stress myoview later today as she has known CAD .  Pt returns for follow up . Says she is taking Advair most days.  No cough, fever or edema.    Past Medical History:  1. Coronary artery disease. Left heart catheterization was done in September 2009 because of exertional chest pain. It  showed an EF of 60% on left ventriculogram, RCA was totally occluded proximally with left-to-right collaterals>> There were poor distal targets for bypass surgery and no good interventional target so medical management was planned. Myoview in [place] in 3/11 was normal per report.  2. Hypertension.  3. Peptic ulcer disease.  4. Hysterectomy.  5. History of HBV, HCV  6. History of chronic cough. ACEI stopped. Possible contribution from GERD.  7. History of PVCs and PACs.  8. GERD  9. IBS  10. Depression  11. Hyperlipidemia  12. Obesity  13. h/o pancreatitis  14. Osteoporosis  15. Knee osteoarthritis  16. Dyspnea/cough: Echo (3/10): EF 55-60%, no regional wall motion abnormalities, aortic sclerosis. Echo (3/11) in [place]: EF 60%, grade I diastolic dysfunction, normal RV, no regional wall motion abnormalities, valves are ok. PFTs (10/10): FVC 103%, FEV1 109%, ratio 74%. Mild obstruction. CT chest (6/11) did not show evidence for interstitial lung disease.     Review of Systems  Patient denies significant cough, hemoptysis,  chest pain, palpitations, pedal edema, orthopnea, paroxysmal nocturnal dyspnea, lightheadedness, nausea, vomiting, abdominal or  leg pains      Objective:   Physical Exam   Gen. Pleasant, well-nourished, in no distress ENT - no lesions, no post nasal drip Neck: No JVD, no thyromegaly, no carotid bruits Lungs: no use of accessory muscles, no dullness to percussion,  CTA , dimished in bases  Cardiovascular: Rhythm regular, heart sounds  normal, no murmurs or gallops, no peripheral edema Musculoskeletal: No deformities, no cyanosis or clubbing        Assessment & Plan:

## 2011-07-25 NOTE — Patient Instructions (Signed)
Continue on Advair 250/24mcg 1 puff Twice daily   follow up Dr. Vassie Loll  In 2 months and as needed.

## 2011-07-25 NOTE — Assessment & Plan Note (Signed)
Improved control over last month without flare Cont on current regimen.  follow up in 2 months and As needed

## 2011-07-25 NOTE — Progress Notes (Addendum)
[facility] SITE 3 NUCLEAR MED [address] [place] [address] [phone]  Cardiology Nuclear Med Study  [name] [name] is a 76 y.o. female [id number] [birth year]   Nuclear Med Background Indication for Stress Test:  Evaluation for Ischemia History:  Asthma; '09 Cath:Significant 3-vessel disease; RCA occluded with collaterals, medical tx.; '10 Echo:EF=55-60% Cardiac Risk Factors: Hypertension, Lipids and Obesity  Symptoms:  Chest Pain/Burning with and without Exertion (last episode of chest discomfort was about 2-weeks ago), DOE and Fatigue with Exertion   Nuclear Pre-Procedure Caffeine/Decaff Intake:  None NPO After: 9:00am   Lungs:  Clear.  O2 SAT 96% on RA. IV 0.9% NS with Angio Cath:  22g  IV Site: L Antecubital  IV Started by:  Stanton Kidney, EMT-P  Chest Size (in):  38 Cup Size: C  Height: 5\' 2"  (1.575 m)  Weight:  184 lb (83.462 kg)  BMI:  Body mass index is 33.65 kg/(m^2). Tech Comments:  NA    Nuclear Med Study 1 or 2 day study: 1 day  Stress Test Type:  Treadmill/Lexiscan  Reading MD: Marca Ancona, MD  Order Authorizing Provider:  Marca Ancona, MD; Norma Fredrickson, PA  Resting Radionuclide: Technetium 8m Tetrofosmin  Resting Radionuclide Dose: 11.0 mCi   Stress Radionuclide:  Technetium 86m Tetrofosmin  Stress Radionuclide Dose: 33.0 mCi           Stress Protocol Rest HR: 73 Stress HR: 131  Rest BP: Sitting:123/80  Standing:119/82 Stress BP: 188/95  Exercise Time (min): 2:00 METS: n/a   Predicted Max HR: 147 bpm % Max HR: 89.12 bpm Rate Pressure Product: 21308   Dose of Adenosine (mg):  n/a Dose of Lexiscan: 0.4 mg  Dose of Atropine (mg): n/a Dose of Dobutamine: n/a mcg/kg/min (at max HR)  Stress Test Technologist: Smiley Houseman, CMA-N  Nuclear Technologist:  Doyne Keel, CNMT     Rest Procedure:  Myocardial perfusion imaging was performed at rest 45 minutes following the intravenous administration of Technetium 88m  Tetrofosmin.  Rest ECG: Prior inferior wall infarction with occasional PVC's.  Stress Procedure:  The patient received IV Lexiscan 0.4 mg over 15-seconds with concurrent low level exercise and then Technetium 27m Tetrofosmin was injected at 30-seconds while the patient continued walking one more minute.  There were no significant changes with Lexiscan, occasional PVC's.  Quantitative spect images were obtained after a 45-minute delay.  Stress ECG: No significant change from baseline ECG  QPS Raw Data Images:  Normal; no motion artifact; normal heart/lung ratio. Stress Images:  Mild inferior perfusion defect.  Rest Images:  Normal homogeneous uptake in all areas of the myocardium. Subtraction (SDS):  Mild reversible inferior perfusion defect.  Transient Ischemic Dilatation (Normal <1.22):  1.21 Lung/Heart Ratio (Normal <0.45):  0.40  Quantitative Gated Spect Images QGS EDV:  93 ml QGS ESV:  31 ml QGS cine images:  NL LV Function; NL Wall Motion QGS EF: 67%  Impression Exercise Capacity:  Lexiscan with low level exercise. BP Response:  Hypertensive blood pressure response. Clinical Symptoms:  nausea ECG Impression:  No significant ST segment change suggestive of ischemia. Comparison with Prior Nuclear Study: Inferior perfusion defect with stress mildly worse.   Overall Impression:  Abnormal stress nuclear study. There is a mild reversible inferior perfusion defect suggestive of ischemia. Normal EF.   [name] [name]   Mild reversible inferior defect suggestive of ischemia.  She has a known occluded RCA.  If she has had more chest pain, may consider repeating  cath.   Marca Ancona 07/26/2011 4:24 PM

## 2011-07-26 NOTE — Progress Notes (Signed)
Voicemail.

## 2011-07-27 ENCOUNTER — Telehealth: Payer: Self-pay | Admitting: *Deleted

## 2011-07-27 NOTE — Telephone Encounter (Signed)
Pt is aware of myoview results.  She will begin light exercise of walking on the treadmill with no incline. Working slowly to increase exercise and listen more to her body's signals. "I was not listening to my body before and just pushing it".  . Reinstructed on use of NTG and activating EMS. Understands return appt with Dr. Shirlee Latch is 08/29/11.  Reassurance given Mylo Red RN

## 2011-07-28 NOTE — Progress Notes (Signed)
Pt aware. See phone note 07/27/11.

## 2011-08-19 ENCOUNTER — Telehealth: Payer: Self-pay | Admitting: Cardiology

## 2011-08-19 NOTE — Telephone Encounter (Signed)
Please return call to patient at hm# [phone]  Patient has questions about medical care, she can be reached at hM#

## 2011-08-19 NOTE — Telephone Encounter (Signed)
Spoke with pt. She reports she has been retaining " a lot of fluid". This has been ongoing for a couple of months. States weight has been 185 in the AM and 189 in the PM. The next AM she will be back to 185 lbs.  No swelling or shortness of breath.  I told her to weigh herself every AM and to let us know if AM weight has increased 3 lbs the next AM.    She also wants to know if she can take "Herbalife" drink to help her lose weight.  I told her we would check with Dr. Shirlee Latch about this and she should not start it until she hears back from [place].

## 2011-08-22 NOTE — Telephone Encounter (Signed)
Ask her to bring in the supplement at next appt

## 2011-08-22 NOTE — Telephone Encounter (Signed)
Notified pt, she will bring package with her to appt on 08/29/11.

## 2011-08-24 ENCOUNTER — Telehealth: Payer: Self-pay | Admitting: Pulmonary Disease

## 2011-08-24 DIAGNOSIS — J45909 Unspecified asthma, uncomplicated: Secondary | ICD-10-CM

## 2011-08-24 MED ORDER — FLUTICASONE-SALMETEROL 250-50 MCG/DOSE IN AEPB: 1.0000 | INHALATION_SPRAY | Freq: Two times a day (BID) | RESPIRATORY_TRACT | Status: DC

## 2011-08-24 NOTE — Telephone Encounter (Signed)
I spoke with pt and is requesting a new rx for the advair sent to [address]. I advised will send rx and nothing further was needed

## 2011-08-29 ENCOUNTER — Ambulatory Visit (INDEPENDENT_AMBULATORY_CARE_PROVIDER_SITE_OTHER): Payer: 59 | Admitting: Cardiology

## 2011-08-29 ENCOUNTER — Telehealth: Payer: Self-pay | Admitting: Pulmonary Disease

## 2011-08-29 ENCOUNTER — Encounter: Payer: Self-pay | Admitting: Cardiology

## 2011-08-29 VITALS — BP 130/82 | HR 80 | Ht 62.0 in | Wt 184.8 lb

## 2011-08-29 DIAGNOSIS — M25519 Pain in unspecified shoulder: Secondary | ICD-10-CM | POA: Insufficient documentation

## 2011-08-29 DIAGNOSIS — E785 Hyperlipidemia, unspecified: Secondary | ICD-10-CM

## 2011-08-29 DIAGNOSIS — I2510 Atherosclerotic heart disease of native coronary artery without angina pectoris: Secondary | ICD-10-CM

## 2011-08-29 DIAGNOSIS — I10 Essential (primary) hypertension: Secondary | ICD-10-CM

## 2011-08-29 LAB — BASIC METABOLIC PANEL
BUN: 19 mg/dL (ref 6–23)
CO2: 27 mEq/L (ref 19–32)
Calcium: 9.3 mg/dL (ref 8.4–10.5)
Chloride: 111 mEq/L (ref 96–112)
Creatinine, Ser: 1.1 mg/dL (ref 0.4–1.2)
GFR: 62.46 mL/min (ref >60.00)
Glucose, Bld: 94 mg/dL (ref 70–99)
Potassium: 4.4 mEq/L (ref 3.5–5.1)
Sodium: 143 mEq/L (ref 135–145)

## 2011-08-29 LAB — HEPATIC FUNCTION PANEL
ALT: 24 U/L (ref 0–35)
AST: 26 U/L (ref 0–37)
Albumin: 3.7 g/dL (ref 3.5–5.2)
Alkaline Phosphatase: 82 U/L (ref 39–117)
Bilirubin, Direct: 0.1 mg/dL (ref 0.0–0.3)
Total Bilirubin: 0.4 mg/dL (ref 0.3–1.2)
Total Protein: 7 g/dL (ref 6.0–8.3)

## 2011-08-29 LAB — LIPID PANEL
Cholesterol: 109 mg/dL (ref 0–200)
HDL: 41 mg/dL (ref >39.00)
LDL Cholesterol: 58 mg/dL (ref 0–99)
Total CHOL/HDL Ratio: 3
Triglycerides: 49 mg/dL (ref 0.0–149.0)
VLDL: 9.8 mg/dL (ref 0.0–40.0)

## 2011-08-29 LAB — SEDIMENTATION RATE: Sed Rate: 24 mm/hr — ABNORMAL HIGH (ref 0–22)

## 2011-08-29 NOTE — Assessment & Plan Note (Signed)
Check lipids/LFTs with goal LDL < 70.  

## 2011-08-29 NOTE — Progress Notes (Signed)
PCP: Eloise Harman  76 yo with history of CAD, asthma, and osteoarthritis presents for cardiology followup.  Patient had a long workup for bronchospasm with Dr. Vassie Loll.  It now appears that she is very sensitive to beta blockers, with bronchospasm/dyspnea with even the most beta-1 selective agents.  She is, therefore, no longer on a beta blocker.  Her aspirin was also stopped due to concern that it could have been contrbuting to bronchospasm.    Since I last saw her, she was seen in the office by Norma Fredrickson in 2/13 with atypical chest pain.  She had a Lexiscan myoview showing normal EF and a mild reversible inferior perfusion defect suggestive of mild ischemia (has a known occluded RCA with collaterals).  Since then, she has actually had no further chest pain.  Main complaint today is pain in her shoulders and neck that she notes when she first wakes up in the morning.  It gets better as the day goes on.  She also has chronic back pain.  She has been going to the [facility] and using the treadmill and stair climber with no significant exertional dyspnea.  Breathing has been fine as long as she takes her Advair.   Labs (6/11): LDL 101, HDL 53 Labs (9/11): K 4.6, creatinine 0.9 Labs (7/12): LDL 82, HDL 57, LFTs normal Labs (4/09): K 3.7, creatinine 0.8  Allergies (verified):  1)  ! Pcn 2)  ! Biaxin 3)  ! * Pravastatin 4)  * Contrast Dye  Past Medical History: 1. Coronary artery disease.  Left heart catheterization was done in September 2009 because of exertional chest pain.  It showed an EF of 60% on left ventriculogram, RCA was totally occluded proximally with left-to-right collaterals, the distal left main had a 20% lesion, there was a small ramus with a 60-70% stenosis, and the mid LAD had  serial 60% stenoses with a distal subtotalled LAD.  There was a 50% first diagonal stenosis and a 50% first OM stenosis. There were poor distal targets for bypass surgery and no good interventional target so medical  management was planned. Myoview in [place] in 3/11 was normal per report. Lexiscan myoview (2/13): EF 67%, mild reversible inferior perfusion defect suggestive of mild ischemia.  2. Hypertension. 3. Peptic ulcer disease. 4. Hysterectomy. 5. History of  HBV, HCV 6. History of chronic cough.  ACEI stopped.  Possible contribution from GERD. 7. History of PVCs and PACs. 8. GERD 9. IBS 10. Depression 11. Hyperlipidemia: CPK elevated while on Crestor.  She thinks pravastatin caused a rash.  12. Obesity 13. h/o pancreatitis 14. Osteoporosis 15. Knee osteoarthritis s/p left TKR.  16. Dyspnea/cough: Echo (3/10): EF 55-60%, no regional wall motion abnormalities, aortic sclerosis.  Echo (3/11) in [place]: EF 60%, grade I diastolic dysfunction, normal RV, no regional wall motion abnormalities, valves are ok.  PFTs (10/10): FVC 103%, FEV1 109%, ratio 74%.  Mild obstruction.  CT chest (6/11) did not show evidence for interstitial lung disease.  Possible asthma or upper airways cough syndrome.  Aspirin and beta blocker have been held as potential causes for bronchospasm.   17. Asthma  Family History: Family History Asthma-sister, mother Family History Emphysema-sister Family History Rheumatoid Arthritis-mother Family History Colon Cancer-maternal aunt Family History Lung Cancer-maternal aunt Pancreatic Cancer-brother Family History C V A / Stroke -mother  Social History: The patient does not drink or smoke (never smoked).   Marital Status:Married lives with husband  Children: Yes Occupation: Retired [employer]  Patient never smoked. (  2nd hand smoke exposure x 40 years)  Review of Systems        All systems reviewed and negative except as per HPI.   Current Outpatient Prescriptions  Medication Sig Dispense Refill  . albuterol (PROVENTIL) (2.5 MG/3ML) 0.083% nebulizer solution Take 2.5 mg by nebulization every 6 (six) hours as needed.        Marland Kitchen albuterol (VENTOLIN HFA) 108  (90 BASE) MCG/ACT inhaler Inhale 2 puffs into the lungs every 6 (six) hours as needed.        Marland Kitchen amLODipine (NORVASC) 5 MG tablet Take 1 tablet (5 mg total) by mouth daily.  30 tablet  11  . clopidogrel (PLAVIX) 75 MG tablet Take 1 tablet (75 mg total) by mouth daily.  90 tablet  3  . Coenzyme Q10 (COQ10 PO) Take by mouth daily.      . Fluticasone-Salmeterol (ADVAIR DISKUS) 250-50 MCG/DOSE AEPB Inhale 1 puff into the lungs 2 (two) times daily.  60 each  5  . losartan (COZAAR) 100 MG tablet Take 1 tablet (100 mg total) by mouth daily.  90 tablet  3  . nitroGLYCERIN (NITROSTAT) 0.4 MG SL tablet Place 0.4 mg under the tongue every 5 (five) minutes as needed.        . pantoprazole (PROTONIX) 40 MG tablet Take 40 mg by mouth daily as needed.      . rosuvastatin (CRESTOR) 20 MG tablet Take 20 mg by mouth daily.       BP 130/82  Pulse 80  Ht 5\' 2"  (1.575 m)  Wt 184 lb 12.8 oz (83.825 kg)  BMI 33.80 kg/m2 General:  Well developed, well nourished, in no acute distress.  Obese.  Neck:  Neck supple, no JVD. No masses, thyromegaly or abnormal cervical nodes. Lungs:  Clear bilaterally to auscultation and percussion. Heart:  Non-displaced PMI, chest non-tender; regular rate and rhythm, S1, S2 without rubs or gallops. 1/6 early systolic ejection murmur at RUSB.  Carotid upstroke normal, no bruit.  Pedals normal pulses. No edema. Abdomen:  Bowel sounds positive; abdomen soft and non-tender without masses, organomegaly, or hernias noted. No hepatosplenomegaly. Extremities:  No clubbing or cyanosis. Neurologic:  Alert and oriented x 3. Psych:  Normal affect.

## 2011-08-29 NOTE — Assessment & Plan Note (Signed)
Patient has extensive CAD but no good options for revascularization.  Last cath showed occluded RCA with left to right collaterals.  Myoview done recently showed mild inferior ischemia that likely correlates to the known occluded RCA.  No other perfusion defects.  No further chest pain.  No indication at this time for cath.  Continue Plavix (wheezes with aspirin), ARB, and Crestor.  Cannot tolerate beta blockers either due to bronchospasm.  

## 2011-08-29 NOTE — Assessment & Plan Note (Signed)
Shoulder girdle and neck pain for the last several months, worse in the morning.  ? PMR.  I will get an ESR and have her followup with her rheumatologist (Dr. Dareen Piano).

## 2011-08-29 NOTE — Assessment & Plan Note (Signed)
BP under good control. Did not tolerate amlodipine due to itching.

## 2011-08-29 NOTE — Telephone Encounter (Signed)
LMTCB

## 2011-08-29 NOTE — Telephone Encounter (Signed)
Pt returned call. [name] [name]  

## 2011-08-29 NOTE — Patient Instructions (Addendum)
Your physician recommends that you have  a FASTING lipid profile /liver profile/BMET/Sed rate today.  Your physician wants you to follow-up in: 4 months with Dr Shirlee Latch. (July 2013).You will receive a reminder letter in the mail two months in advance. If you don't receive a letter, please call our office to schedule the follow-up appointment.   Dr Vincenza Hews Alanson Puls

## 2011-08-30 NOTE — Telephone Encounter (Signed)
Returning call.  [phone]

## 2011-08-30 NOTE — Telephone Encounter (Signed)
Unfortunately, no other alternative for advair - samples will not last her long. She can try flovent 110 mcg 2 puffs bid - have her find out the cost of this

## 2011-08-30 NOTE — Telephone Encounter (Signed)
Spoke with pt. She states would like RA to prescribe a different maintenance inhaler for her. She states that the advair is covered by her insurance, but is still 70 $ per month supply and this is unaffordable for her. She would like to try something different. She has tried symbicort in the past, but states that this did not work as well as the advair.  Please advise, thanks!

## 2011-08-30 NOTE — Telephone Encounter (Signed)
Spoke with pt and notified of recs per RA. She will contact pharm and ask about cost of flovent 110 vs advair and call back and let us know if he should send rx or not.

## 2011-08-31 NOTE — Telephone Encounter (Signed)
I called, spoke with pt to see if she has checked on this yet.  Per pt, she has not called the pharm yet but will do so today and call back to let us know.

## 2011-09-01 NOTE — Telephone Encounter (Signed)
I spoke with the patient and she did not check with pharmacy yet. I advised that I was going to sign off on note and she can let us know when she has decided if flovent will be a better option. Pt states understanding. Carron Curie, CMA

## 2011-09-08 ENCOUNTER — Other Ambulatory Visit: Payer: Self-pay | Admitting: Internal Medicine

## 2011-09-08 DIAGNOSIS — Z1231 Encounter for screening mammogram for malignant neoplasm of breast: Secondary | ICD-10-CM

## 2011-09-19 ENCOUNTER — Ambulatory Visit: Payer: 59 | Admitting: Pulmonary Disease

## 2011-10-06 ENCOUNTER — Ambulatory Visit: Payer: 59

## 2011-10-07 ENCOUNTER — Ambulatory Visit
Admission: RE | Admit: 2011-10-07 | Discharge: 2011-10-07 | Disposition: A | Discharge status: Home / Self Care | Payer: PRIVATE HEALTH INSURANCE | Source: Ambulatory Visit | Attending: Internal Medicine | Admitting: Internal Medicine

## 2011-10-07 DIAGNOSIS — Z1231 Encounter for screening mammogram for malignant neoplasm of breast: Secondary | ICD-10-CM

## 2011-10-25 ENCOUNTER — Telehealth: Payer: Self-pay | Admitting: Cardiology

## 2011-10-25 MED ORDER — TRAMADOL HCL 50 MG PO TABS: 50.0000 mg | ORAL_TABLET | Freq: Four times a day (QID) | ORAL | Status: AC | PRN

## 2011-10-25 NOTE — Telephone Encounter (Signed)
New msg Pt said she has been having some arthritis pain and wants to know what she can take.

## 2011-10-25 NOTE — Telephone Encounter (Signed)
Spoke with pt, she is having a lot arthritis pain and inflammation. She wants to know what she can take with her plavix. Tylenol is not available due to recall. The pt says dr Shirlee Latch had given her something before but does not know what it was. Will forward for dr mclean's review

## 2011-10-25 NOTE — Telephone Encounter (Signed)
She can take acetaminophen (generic Tylenol) or tramadol 50 mg bid prn if she needs something stronger.

## 2011-10-25 NOTE — Telephone Encounter (Signed)
Spoke with pt, she would like to try tramadol. Script called to the pharm.

## 2011-11-07 ENCOUNTER — Ambulatory Visit (INDEPENDENT_AMBULATORY_CARE_PROVIDER_SITE_OTHER): Payer: PRIVATE HEALTH INSURANCE | Admitting: Pulmonary Disease

## 2011-11-07 DIAGNOSIS — R059 Cough, unspecified: Secondary | ICD-10-CM

## 2011-11-07 DIAGNOSIS — R05 Cough: Secondary | ICD-10-CM

## 2011-11-07 NOTE — Progress Notes (Signed)
  Subjective:    Patient ID: [name] [name], female    DOB: 1935/06/09, 76 y.o.   MRN: [id number]  HPI 36F never smoker with CAD for FU of reactive airway disease - trigger ? GERD.  10/29/09 - initial  This was of insidious onset - - She had an ER visit during a trip to [place] - echoi nml LV fn, BNP 55, stress myoview nml, given cipro for 'bronchitis.'  Review of CXR 08/10/09 shows bibasal interstitial prominence, lt volume loss with raised hemidiaphragm  CT angio chest 8/09 was neg for PE, stable RUL 5mm nodule as compared to 05/2005, no obvious fibrosis. CT sinuses 8/11 nml  She reports good control of heartburn with protonix , had an EGD by Dr Loreta Ave in 11/10  PFTs in 10/10 were essentially nml, no obstruction, nml lung volumes & diffusion.  She had a sleep study at [facility] neurology. Spirometry6/11 shows nml lung function, RAST neg, Labs showed neg ANA, and low ESR. RA factor was elevated.  ER visit on 01/09/10 - absolute eos count 800 ,BNP 69, CXR - cardiomegaly, was on prednisone .  Sep, 2011 >> Referred to Rheum Dareen Piano) - favors OA rather than RA, CCP neg, CK was high 709 - muscle cramps, crestor stopped Stopped advair x 2 weeks - 'ran out', beta blockers stopped  Underwent EMG,  06/13/11  Self dc'd advair again, copay of $33, Cardiac meds too expensive  Takes prednisone sporadically rather than as prescribed, has run out of albuterol rescue inhaler  Required prednisone tapers frequently last few months  >>no changes   11/07/2011  07/25/2011 Follow up  Returns for follow up . Says she has been doing well since last ov with her breathing. No flare in symptoms , no steroid use . No increased SABA use.  Does have stress myoview later today as she has known CAD .  Pt returns for follow up . Says she is taking Advair most days.  No cough, fever or edema.   a different maintenance inhaler for her. She states that the advair is covered by her insurance, but is still 70 $ per month supply and  this is unaffordable    Past Medical History:  1. Coronary artery disease. Left heart catheterization was done in September 2009 because of exertional chest pain. It showed an EF of 60% on left ventriculogram, RCA was totally occluded proximally with left-to-right collaterals>> There were poor distal targets for bypass surgery and no good interventional target so medical management was planned. Myoview in [place] in 3/11 was normal per report.  2. Hypertension.  3. Peptic ulcer disease.  4. Hysterectomy.  5. History of HBV, HCV  6. History of chronic cough. ACEI stopped. Possible contribution from GERD.  7. History of PVCs and PACs.  8. GERD  9. IBS  10. Depression  11. Hyperlipidemia  12. Obesity  13. h/o pancreatitis  14. Osteoporosis  15. Knee osteoarthritis  16. Dyspnea/cough: Echo (3/10): EF 55-60%, no regional wall motion abnormalities, aortic sclerosis. Echo (3/11) in [place]: EF 60%, grade I diastolic dysfunction, normal RV, no regional wall motion abnormalities, valves are ok. PFTs (10/10): FVC 103%, FEV1 109%, ratio 74%. Mild obstruction. CT chest (6/11) did not show evidence for interstitial lung disease.       Review of Systems     Objective:   Physical Exam        Assessment & Plan:

## 2011-11-09 ENCOUNTER — Telehealth: Payer: Self-pay | Admitting: Cardiology

## 2011-11-09 NOTE — Telephone Encounter (Signed)
LM on identified voice mail--OK to take 50,000 IU of vit D

## 2011-11-09 NOTE — Telephone Encounter (Signed)
New Problem:    Patient called in wanting to know if she would be able to take 50,000 IU of Vitamin D.  Please call back.

## 2011-11-09 NOTE — Telephone Encounter (Signed)
Spoke with pt. Pt states she was told her Vitamin D level was low. She was given a prescription for Vit D 50,000 IU to take weekly. She wants to be sure it is OK with Dr Shirlee Latch to take this. I will forward to Dr Shirlee Latch for review.

## 2011-12-28 ENCOUNTER — Encounter: Payer: Self-pay | Admitting: Cardiology

## 2011-12-28 ENCOUNTER — Ambulatory Visit (INDEPENDENT_AMBULATORY_CARE_PROVIDER_SITE_OTHER): Payer: PRIVATE HEALTH INSURANCE | Admitting: Cardiology

## 2011-12-28 VITALS — BP 136/86 | HR 73 | Ht 62.0 in | Wt 177.0 lb

## 2011-12-28 DIAGNOSIS — E785 Hyperlipidemia, unspecified: Secondary | ICD-10-CM

## 2011-12-28 DIAGNOSIS — I2510 Atherosclerotic heart disease of native coronary artery without angina pectoris: Secondary | ICD-10-CM

## 2011-12-28 DIAGNOSIS — M069 Rheumatoid arthritis, unspecified: Secondary | ICD-10-CM

## 2011-12-28 NOTE — Patient Instructions (Addendum)
Your physician recommends that you return for a FASTING lipid profile in September 2013.  Your physician wants you to follow-up in: 6 months with Dr Shirlee Latch. (January 2014).You will receive a reminder letter in the mail two months in advance. If you don't receive a letter, please call our office to schedule the follow-up appointment.

## 2011-12-29 DIAGNOSIS — M069 Rheumatoid arthritis, unspecified: Secondary | ICD-10-CM | POA: Insufficient documentation

## 2011-12-29 NOTE — Assessment & Plan Note (Signed)
Patient has extensive CAD but no good options for revascularization.  Last cath showed occluded RCA with left to right collaterals.  Myoview done recently showed mild inferior ischemia that likely correlates to the known occluded RCA.  No other perfusion defects.  No further chest pain.  No indication at this time for cath.  Continue Plavix (wheezes with aspirin), ARB, and Crestor.  Cannot tolerate beta blockers either due to bronchospasm.

## 2011-12-29 NOTE — Assessment & Plan Note (Signed)
Check lipids in 9/13.  Goal LDL < 70.

## 2011-12-29 NOTE — Assessment & Plan Note (Signed)
I encouraged her to have this treated.  Beyond the pain, the inflammation of active rheumatoid probably can increase risk of cardiac events.

## 2011-12-29 NOTE — Progress Notes (Signed)
Patient ID: [name] [name], female   DOB: [birth year], 76 y.o.   MRN: [id number] PCP: Eloise Harman  76 yo with history of CAD, asthma, and osteoarthritis presents for cardiology followup.  Patient had a long workup for bronchospasm with Dr. Vassie Loll.  It now appears that she is very sensitive to beta blockers, with bronchospasm/dyspnea with even the most beta-1 selective agents.  She is, therefore, no longer on a beta blocker.  Her aspirin was also stopped due to concern that it could have been contributing to bronchospasm.    She has been doing well lately from a cardiac standpoint.  She exercises 3 times a week at the [facility] on the stairclimber and the treadmill.  No chest pain or exertional dyspnea.  Main complaint is joint pain.  She has pain in her shoulders, neck, feet, and hands.  She saw Dr. Dareen Piano and was told she has rheumatoid arthritis.  He offered to treat her with a DMARD but she is thinking about it.   Labs (6/11): LDL 101, HDL 53 Labs (9/11): K 4.6, creatinine 0.9 Labs (7/12): LDL 82, HDL 57, LFTs normal Labs (4/09): K 3.7, creatinine 0.8 Labs (3/13): K 4.4, creatinine 1.1, LDL 58, HDL 41  Allergies (verified):  1)  ! Pcn 2)  ! Biaxin 3)  ! * Pravastatin 4)  * Contrast Dye  Past Medical History: 1. Coronary artery disease.  Left heart catheterization was done in September 2009 because of exertional chest pain.  It showed an EF of 60% on left ventriculogram, RCA was totally occluded proximally with left-to-right collaterals, the distal left main had a 20% lesion, there was a small ramus with a 60-70% stenosis, and the mid LAD had  serial 60% stenoses with a distal subtotalled LAD.  There was a 50% first diagonal stenosis and a 50% first OM stenosis. There were poor distal targets for bypass surgery and no good interventional target so medical management was planned. Myoview in [place] in 3/11 was normal per report. Lexiscan myoview (2/13): EF 67%, mild reversible inferior perfusion defect  suggestive of mild ischemia.  2. Hypertension: amlodipine caused itching.  3. Peptic ulcer disease. 4. Hysterectomy. 5. History of  HBV, HCV 6. History of chronic cough.  ACEI stopped.  Possible contribution from GERD. 7. History of PVCs and PACs. 8. GERD 9. IBS 10. Depression 11. Hyperlipidemia: CPK elevated while on Crestor.  She thinks pravastatin caused a rash.  12. Obesity 13. h/o pancreatitis 14. Osteoporosis 15. Knee osteoarthritis s/p left TKR.  16. Dyspnea/cough: Echo (3/10): EF 55-60%, no regional wall motion abnormalities, aortic sclerosis.  Echo (3/11) in [place]: EF 60%, grade I diastolic dysfunction, normal RV, no regional wall motion abnormalities, valves are ok.  PFTs (10/10): FVC 103%, FEV1 109%, ratio 74%.  Mild obstruction.  CT chest (6/11) did not show evidence for interstitial lung disease.  Possible asthma or upper airways cough syndrome.  Aspirin and beta blocker have been held as potential causes for bronchospasm.   17. Asthma 18. Rheumatoid arthritis.   Family History: Family History Asthma-sister, mother Family History Emphysema-sister Family History Rheumatoid Arthritis-mother Family History Colon Cancer-maternal aunt Family History Lung Cancer-maternal aunt Pancreatic Cancer-brother Family History C V A / Stroke -mother  Social History: The patient does not drink or smoke (never smoked).   Marital Status:Married lives with husband  Children: Yes Occupation: Retired [employer]  Patient never smoked. (2nd hand smoke exposure x 40 years)   Current Outpatient Prescriptions  Medication Sig Dispense Refill  .  albuterol (PROVENTIL) (2.5 MG/3ML) 0.083% nebulizer solution Take 2.5 mg by nebulization every 6 (six) hours as needed.        Marland Kitchen albuterol (VENTOLIN HFA) 108 (90 BASE) MCG/ACT inhaler Inhale 2 puffs into the lungs every 6 (six) hours as needed.        . clopidogrel (PLAVIX) 75 MG tablet Take 1 tablet (75 mg total) by mouth daily.   90 tablet  3  . Coenzyme Q10 (COQ10 PO) Take by mouth daily.      . Fluticasone-Salmeterol (ADVAIR DISKUS) 250-50 MCG/DOSE AEPB Inhale 1 puff into the lungs 2 (two) times daily.  60 each  5  . losartan (COZAAR) 100 MG tablet Take 1 tablet (100 mg total) by mouth daily.  90 tablet  3  . nitroGLYCERIN (NITROSTAT) 0.4 MG SL tablet Place 0.4 mg under the tongue every 5 (five) minutes as needed.        . pantoprazole (PROTONIX) 40 MG tablet Take 40 mg by mouth daily as needed.      . rosuvastatin (CRESTOR) 20 MG tablet Take 20 mg by mouth daily.      . Vitamin D, Ergocalciferol, (DRISDOL) 50000 UNITS CAPS Take 50,000 Units by mouth every 7 (seven) days.      Marland Kitchen DISCONTD: rosuvastatin (CRESTOR) 10 MG tablet Take 1 tablet (10 mg total) by mouth at bedtime.  90 tablet  3   BP 136/86  Pulse 73  Ht 5\' 2"  (1.575 m)  Wt 177 lb (80.287 kg)  BMI 32.37 kg/m2  SpO2 98% General:  Well developed, well nourished, in no acute distress.  Obese.  Neck:  Neck supple, no JVD. No masses, thyromegaly or abnormal cervical nodes. Lungs:  Clear bilaterally to auscultation and percussion. Heart:  Non-displaced PMI, chest non-tender; regular rate and rhythm, S1, S2 without rubs or gallops. 1/6 early systolic ejection murmur at RUSB.  Carotid upstroke normal, no bruit.  Pedals normal pulses. No edema. Abdomen:  Bowel sounds positive; abdomen soft and non-tender without masses, organomegaly, or hernias noted. No hepatosplenomegaly. Extremities:  No clubbing or cyanosis. Neurologic:  Alert and oriented x 3. Psych:  Normal affect.

## 2012-01-31 ENCOUNTER — Telehealth: Payer: Self-pay | Admitting: Cardiology

## 2012-01-31 MED ORDER — CLOPIDOGREL BISULFATE 75 MG PO TABS: 75.0000 mg | ORAL_TABLET | Freq: Every day | ORAL | Status: DC

## 2012-01-31 NOTE — Telephone Encounter (Signed)
Pt ran out of Plavix yesterday. She said she saw on the Internet that Plavix was taken off the market. I informed her that we have not heard this, confirmed with pharmacy also. Pt wants something else in place of plavix/ taken for CAD no stents currently. Told her I would send a note to DR/Nurse and she will hear back tomorrow when they are in clinic. Pt agreed to plan.

## 2012-01-31 NOTE — Telephone Encounter (Signed)
Left msg that she needed to be on the med/ plavix, no other options and that I would send another rx to be sure she had refills, provided number to return call if further questions.

## 2012-01-31 NOTE — Telephone Encounter (Signed)
Please return call to patient [phone], regarding medication questions about plavix

## 2012-01-31 NOTE — Telephone Encounter (Signed)
She needs to be on Plavix.  It is not off the market.  Have her take clopidogrel (generic version) 75 mg daily.  Needs to restart now.  No other option.

## 2012-02-09 ENCOUNTER — Telehealth: Payer: Self-pay | Admitting: Cardiology

## 2012-02-09 DIAGNOSIS — I10 Essential (primary) hypertension: Secondary | ICD-10-CM

## 2012-02-09 DIAGNOSIS — I2510 Atherosclerotic heart disease of native coronary artery without angina pectoris: Secondary | ICD-10-CM

## 2012-02-09 MED ORDER — LOSARTAN POTASSIUM 100 MG PO TABS: 100.0000 mg | ORAL_TABLET | Freq: Every day | ORAL | Status: DC

## 2012-02-09 MED ORDER — ROSUVASTATIN CALCIUM 20 MG PO TABS: 20.0000 mg | ORAL_TABLET | Freq: Every day | ORAL | Status: DC

## 2012-02-09 NOTE — Telephone Encounter (Signed)
New Problem:    Called in needing refills of her rosuvastatin (CRESTOR) 20 MG tablet and  losartan (COZAAR) 100 MG tablet sent in the the [facility] on [employer] phone#- [phone].

## 2012-02-10 ENCOUNTER — Other Ambulatory Visit: Payer: Self-pay | Admitting: Cardiology

## 2012-02-28 ENCOUNTER — Other Ambulatory Visit (INDEPENDENT_AMBULATORY_CARE_PROVIDER_SITE_OTHER): Payer: PRIVATE HEALTH INSURANCE

## 2012-02-28 DIAGNOSIS — I2510 Atherosclerotic heart disease of native coronary artery without angina pectoris: Secondary | ICD-10-CM

## 2012-02-28 LAB — LIPID PANEL
Cholesterol: 123 mg/dL (ref 0–200)
HDL: 42.6 mg/dL (ref >39.00)
LDL Cholesterol: 63 mg/dL (ref 0–99)
Total CHOL/HDL Ratio: 3
Triglycerides: 89 mg/dL (ref 0.0–149.0)
VLDL: 17.8 mg/dL (ref 0.0–40.0)

## 2012-03-06 ENCOUNTER — Telehealth: Payer: Self-pay | Admitting: Cardiology

## 2012-03-06 NOTE — Telephone Encounter (Signed)
LMOM with recent lipid results. Pt to call if questions

## 2012-03-06 NOTE — Telephone Encounter (Signed)
Pt rtn call re test results °

## 2012-03-06 NOTE — Telephone Encounter (Signed)
LMTCB

## 2012-03-23 ENCOUNTER — Telehealth: Payer: Self-pay | Admitting: Cardiology

## 2012-03-23 MED ORDER — PANTOPRAZOLE SODIUM 40 MG PO TBEC: 40.0000 mg | DELAYED_RELEASE_TABLET | Freq: Every day | ORAL | Status: DC | PRN

## 2012-03-23 NOTE — Telephone Encounter (Signed)
[facility] states they need a prescription for Pantoprazole faxed to them for a mailorder prescription instead of in house.  Re-order wasn't checked since they are doing mail order.

## 2012-03-23 NOTE — Telephone Encounter (Signed)
Refilled Pantoprazole 

## 2012-06-11 ENCOUNTER — Telehealth: Payer: Self-pay | Admitting: Cardiology

## 2012-06-11 NOTE — Telephone Encounter (Signed)
PT CALLING RE PLAVIX  SAMPLES

## 2012-06-11 NOTE — Telephone Encounter (Signed)
Advised pt that we do not have samples of Plavix.

## 2012-07-06 ENCOUNTER — Telehealth: Payer: Self-pay | Admitting: Cardiology

## 2012-07-06 MED ORDER — CLOPIDOGREL BISULFATE 75 MG PO TABS: 75.0000 mg | ORAL_TABLET | Freq: Every day | ORAL | Status: DC

## 2012-07-06 MED ORDER — ROSUVASTATIN CALCIUM 20 MG PO TABS: 20.0000 mg | ORAL_TABLET | Freq: Every day | ORAL | Status: DC

## 2012-07-06 NOTE — Telephone Encounter (Signed)
Pt out of plavix and crestor for 3 weeks, needs refill to [facility] [address]

## 2012-07-11 ENCOUNTER — Encounter: Payer: Self-pay | Admitting: *Deleted

## 2012-07-18 ENCOUNTER — Encounter: Payer: Self-pay | Admitting: Cardiology

## 2012-07-18 ENCOUNTER — Ambulatory Visit (INDEPENDENT_AMBULATORY_CARE_PROVIDER_SITE_OTHER): Payer: PRIVATE HEALTH INSURANCE | Admitting: Cardiology

## 2012-07-18 VITALS — BP 136/93 | HR 86 | Ht 62.0 in | Wt 185.5 lb

## 2012-07-18 DIAGNOSIS — I2510 Atherosclerotic heart disease of native coronary artery without angina pectoris: Secondary | ICD-10-CM

## 2012-07-18 DIAGNOSIS — E785 Hyperlipidemia, unspecified: Secondary | ICD-10-CM

## 2012-07-18 DIAGNOSIS — I10 Essential (primary) hypertension: Secondary | ICD-10-CM

## 2012-07-18 MED ORDER — HYDROCHLOROTHIAZIDE 25 MG PO TABS: 25.0000 mg | ORAL_TABLET | Freq: Every day | ORAL | Status: DC

## 2012-07-18 MED ORDER — POTASSIUM CHLORIDE ER 10 MEQ PO TBCR: 10.0000 meq | EXTENDED_RELEASE_TABLET | Freq: Every day | ORAL | Status: DC

## 2012-07-18 NOTE — Patient Instructions (Addendum)
Start HCTZ(hydrochlorothiazide) 25mg  daily.  Start KCL(potassium) 10 mEq daily.   Your physician recommends that you have  lab work on Friday February 21,2014. The lab opens at 7:30am.  You have  follow-up appointment on Tuesday June 17,2014 at 8:45am.

## 2012-07-19 NOTE — Progress Notes (Signed)
Patient ID: [name] [name], female   DOB: [birth year], 77 y.o.   MRN: [id number] PCP: Eloise Harman  77 yo with history of CAD, asthma, and osteoarthritis presents for cardiology followup.  Patient had a long workup for bronchospasm with Dr. Vassie Loll.  It now appears that she is very sensitive to beta blockers, with bronchospasm/dyspnea with even the most beta-1 selective agents.  She is, therefore, no longer on a beta blocker.  Her aspirin was also stopped due to concern that it could have been contributing to bronchospasm.    She continues to exercise 3 times a week at the [facility] on the stairclimber and the treadmill.  No exertional chest pain or exertional dyspnea.  She continues to have pain in her shoulders, neck, feet, and hands from rheumatoid arthritis.  She gets occasional "pin prick"sensation chest pain that lasts for a couple of seconds and has no particular trigger.  Her BP has been running high at home, it is 136/93 today.  She feels like she has been "retaining fluid." Weight is up 8 lbs since last visit.   ECG: NSR, old inferior MI  Labs (6/11): LDL 101, HDL 53 Labs (9/11): K 4.6, creatinine 0.9 Labs (7/12): LDL 82, HDL 57, LFTs normal Labs (6/21): K 3.7, creatinine 0.8 Labs (3/13): K 4.4, creatinine 1.1, LDL 58, HDL 41 Labs (9/13): LDL 63, HDL 43  Allergies (verified):  1)  ! Pcn 2)  ! Biaxin 3)  ! * Pravastatin 4)  * Contrast Dye  Past Medical History: 1. Coronary artery disease.  Left heart catheterization was done in September 2009 because of exertional chest pain.  It showed an EF of 60% on left ventriculogram, RCA was totally occluded proximally with left-to-right collaterals, the distal left main had a 20% lesion, there was a small ramus with a 60-70% stenosis, and the mid LAD had  serial 60% stenoses with a distal subtotalled LAD.  There was a 50% first diagonal stenosis and a 50% first OM stenosis. There were poor distal targets for bypass surgery and no good interventional target  so medical management was planned. Myoview in [place] in 3/11 was normal per report. Lexiscan myoview (2/13): EF 67%, mild reversible inferior perfusion defect suggestive of mild ischemia.  2. Hypertension: amlodipine caused itching.  3. Peptic ulcer disease. 4. Hysterectomy. 5. History of  HBV, HCV 6. History of chronic cough.  ACEI stopped.  Possible contribution from GERD. 7. History of PVCs and PACs. 8. GERD 9. IBS 10. Depression 11. Hyperlipidemia: CPK elevated while on Crestor.  She thinks pravastatin caused a rash.  12. Obesity 13. h/o pancreatitis 14. Osteoporosis 15. Knee osteoarthritis s/p left TKR.  16. Dyspnea/cough: Echo (3/10): EF 55-60%, no regional wall motion abnormalities, aortic sclerosis.  Echo (3/11) in [place]: EF 60%, grade I diastolic dysfunction, normal RV, no regional wall motion abnormalities, valves are ok.  PFTs (10/10): FVC 103%, FEV1 109%, ratio 74%.  Mild obstruction.  CT chest (6/11) did not show evidence for interstitial lung disease.  Possible asthma or upper airways cough syndrome.  Aspirin and beta blocker have been held as potential causes for bronchospasm.   17. Asthma 18. Rheumatoid arthritis.   Family History: Family History Asthma-sister, mother Family History Emphysema-sister Family History Rheumatoid Arthritis-mother Family History Colon Cancer-maternal aunt Family History Lung Cancer-maternal aunt Pancreatic Cancer-brother Family History C V A / Stroke -mother  Social History: The patient does not drink or smoke (never smoked).   Marital Status:Married lives with husband  Children: Yes  Occupation: Retired [employer]  Patient never smoked. (2nd hand smoke exposure x 40 years)  ROS: All systems reviewed and negative except as per HPI.    Current Outpatient Prescriptions  Medication Sig Dispense Refill  . albuterol (PROVENTIL) (2.5 MG/3ML) 0.083% nebulizer solution Take 2.5 mg by nebulization every 6 (six) hours as  needed.        Marland Kitchen albuterol (VENTOLIN HFA) 108 (90 BASE) MCG/ACT inhaler Inhale 2 puffs into the lungs every 6 (six) hours as needed.        . clopidogrel (PLAVIX) 75 MG tablet Take 1 tablet (75 mg total) by mouth daily.  90 tablet  3  . Coenzyme Q10 (COQ10 PO) Take by mouth daily.      . Fluticasone-Salmeterol (ADVAIR DISKUS) 250-50 MCG/DOSE AEPB Inhale 1 puff into the lungs 2 (two) times daily.  60 each  5  . losartan (COZAAR) 100 MG tablet Take 1 tablet (100 mg total) by mouth daily.  90 tablet  1  . nitroGLYCERIN (NITROSTAT) 0.4 MG SL tablet Place 0.4 mg under the tongue every 5 (five) minutes as needed.        . pantoprazole (PROTONIX) 40 MG tablet Take 1 tablet (40 mg total) by mouth daily as needed.  30 tablet  3  . rosuvastatin (CRESTOR) 20 MG tablet Take 1 tablet (20 mg total) by mouth daily.  90 tablet  1  . hydrochlorothiazide (HYDRODIURIL) 25 MG tablet Take 1 tablet (25 mg total) by mouth daily.  30 tablet  6  . potassium chloride (K-DUR) 10 MEQ tablet Take 1 tablet (10 mEq total) by mouth daily.  30 tablet  6  . Vitamin D, Ergocalciferol, (DRISDOL) 50000 UNITS CAPS Take 50,000 Units by mouth every 7 (seven) days.       No current facility-administered medications for this visit.   BP 136/93  Pulse 86  Ht 5\' 2"  (1.575 m)  Wt 185 lb 8 oz (84.142 kg)  BMI 33.92 kg/m2 General:  Well developed, well nourished, in no acute distress.  Obese.  Neck:  Neck supple, no JVD. No masses, thyromegaly or abnormal cervical nodes. Lungs:  Clear bilaterally to auscultation and percussion. Heart:  Non-displaced PMI, chest non-tender; regular rate and rhythm, S1, S2 without rubs or gallops. 1/6 early systolic ejection murmur at RUSB.  Carotid upstroke normal, no bruit.  Pedals normal pulses. No edema. Abdomen:  Bowel sounds positive; abdomen soft and non-tender without masses, organomegaly, or hernias noted. No hepatosplenomegaly. Extremities:  No clubbing or cyanosis. Neurologic:  Alert and  oriented x 3. Psych:  Normal affect.  Assessment/Plan: 1. CAD:  Patient has extensive CAD but no good options for revascularization. Last cath showed occluded RCA with left to right collaterals. Myoview done in 2013 showed mild inferior ischemia that likely correlates to the known occluded RCA. No other perfusion defects. She has had only mild atypical chest pain recently.  No indication at this time for cath. Continue Plavix (wheezes with aspirin), ARB, and Crestor. Cannot tolerate beta blockers either due to bronchospasm.   2. Hyperlipidemia: Good lipids in 9/13 on Crestor.  She will continue this.  Joint pain likely due to rheumatoid arthritis, not statin.  3. HTN: BP has been running high.  Patient also feels like she has been "retaining fluid" and her weight is up.  On exam, she does not appear volume overloaded and she is not short of breath.  I will have her start a diuretic for BP treatment, will use  HCTZ 25 mg daily along with KCl 20 mEq daily.  BMET in 10 days.   Marca Ancona 07/19/2012 2:36 PM

## 2012-07-26 ENCOUNTER — Other Ambulatory Visit: Payer: Self-pay | Admitting: Cardiology

## 2012-07-27 ENCOUNTER — Other Ambulatory Visit (INDEPENDENT_AMBULATORY_CARE_PROVIDER_SITE_OTHER): Payer: PRIVATE HEALTH INSURANCE

## 2012-07-27 ENCOUNTER — Telehealth: Payer: Self-pay | Admitting: Cardiology

## 2012-07-27 DIAGNOSIS — I2510 Atherosclerotic heart disease of native coronary artery without angina pectoris: Secondary | ICD-10-CM

## 2012-07-27 LAB — BASIC METABOLIC PANEL
BUN: 22 mg/dL (ref 6–23)
CO2: 25 mEq/L (ref 19–32)
Calcium: 8.7 mg/dL (ref 8.4–10.5)
Chloride: 110 mEq/L (ref 96–112)
Creatinine, Ser: 1 mg/dL (ref 0.4–1.2)
GFR: 67.98 mL/min (ref >60.00)
Glucose, Bld: 92 mg/dL (ref 70–99)
Potassium: 4.3 mEq/L (ref 3.5–5.1)
Sodium: 141 mEq/L (ref 135–145)

## 2012-07-27 NOTE — Telephone Encounter (Signed)
Spoke with pt who states the newest medications she was given (K and HCTZ) have caused her to have a really bad stomach ache and nausea.  She has been using a heating pad on it to make it feel better.  She states when she first started taking them her urine output increased but has been decreasing now.  I asked pt to hold the medications over the weekend and call back Monday to let us know if there was improvement of her s/s.  If she doesn't feel any better off the medications she should restart the medications and follow up with her PCP.  Pt is agreeable and will call back on Monday.  Of note she is aware her lab work from today was normal.

## 2012-07-27 NOTE — Telephone Encounter (Signed)
New problem   Pt called with correct phone number.

## 2012-07-27 NOTE — Telephone Encounter (Signed)
Walk In Pt form " Med List Dropped Off" Questions about Medication Sent to Message Nurse  07/27/12/KM

## 2012-07-28 NOTE — Telephone Encounter (Signed)
What medication is it?

## 2012-07-30 NOTE — Telephone Encounter (Signed)
Spoke with pt. She is feeling better. Pt is going to restart HCTZ and KCL today. Pt will call if she has problems after restarting.

## 2012-08-27 ENCOUNTER — Telehealth: Payer: Self-pay | Admitting: Cardiology

## 2012-08-27 NOTE — Telephone Encounter (Signed)
New Prob   Has questions regarding medication (pain medication). Would like to speak to nurse.

## 2012-08-27 NOTE — Telephone Encounter (Signed)
Spoke with patient. Pt states she is having a lot of joint and arthritis pain. She had been given prednisone in the past for this and took 1 on Saturday and 1 on Sunday. She states she is retaining fluid but can exercise without unusual SOB and denies orthopnea. Pt states she did not lose any weight after she started HCTZ in February. Pt was unable to come to see Dr Shirlee Latch tomorrow so I have scheduled her to see him 08/29/12.

## 2012-08-29 ENCOUNTER — Ambulatory Visit (INDEPENDENT_AMBULATORY_CARE_PROVIDER_SITE_OTHER): Payer: PRIVATE HEALTH INSURANCE | Admitting: Cardiology

## 2012-08-29 ENCOUNTER — Encounter: Payer: Self-pay | Admitting: Cardiology

## 2012-08-29 VITALS — BP 144/90 | HR 69 | Ht 62.0 in | Wt 187.0 lb

## 2012-08-29 DIAGNOSIS — I2510 Atherosclerotic heart disease of native coronary artery without angina pectoris: Secondary | ICD-10-CM

## 2012-08-29 DIAGNOSIS — I10 Essential (primary) hypertension: Secondary | ICD-10-CM

## 2012-08-29 DIAGNOSIS — E785 Hyperlipidemia, unspecified: Secondary | ICD-10-CM

## 2012-08-29 LAB — LIPID PANEL
Cholesterol: 125 mg/dL (ref 0–200)
HDL: 44.4 mg/dL (ref >39.00)
LDL Cholesterol: 54 mg/dL (ref 0–99)
Total CHOL/HDL Ratio: 3
Triglycerides: 131 mg/dL (ref 0.0–149.0)
VLDL: 26.2 mg/dL (ref 0.0–40.0)

## 2012-08-29 MED ORDER — TRAMADOL HCL 50 MG PO TABS: 50.0000 mg | ORAL_TABLET | Freq: Two times a day (BID) | ORAL | Status: DC | PRN

## 2012-08-29 NOTE — Progress Notes (Signed)
Patient ID: [name] [name], female   DOB: 1935/09/17, 77 y.o.   MRN: [id number] PCP: Eloise Harman  77 yo with history of CAD, asthma, and osteoarthritis presents for cardiology followup.  Patient had a long workup for bronchospasm with Dr. Vassie Loll.  It now appears that she is very sensitive to beta blockers, with bronchospasm/dyspnea with even the most beta-1 selective agents.  She is, therefore, no longer on a beta blocker.  Her aspirin was also stopped due to concern that it could have been contributing to bronchospasm.    She continues to exercise 3 times a week at the [facility] on the stairclimber and the treadmill.  No exertional chest pain or exertional dyspnea.  She continues to have pain in her shoulders, neck, feet, and hands from rheumatoid arthritis.  BP is mildly elevated today but she has not taken any of her medications.  It has been within normal range when she has checked it at home.  Weight is stable.   Labs (6/11): LDL 101, HDL 53 Labs (9/11): K 4.6, creatinine 0.9 Labs (7/12): LDL 82, HDL 57, LFTs normal Labs (4/09): K 3.7, creatinine 0.8 Labs (3/13): K 4.4, creatinine 1.1, LDL 58, HDL 41 Labs (9/13): LDL 63, HDL 43 Labs (2/14): K 4.3, creatinine 1.0  Allergies (verified):  1)  ! Pcn 2)  ! Biaxin 3)  ! * Pravastatin 4)  * Contrast Dye  Past Medical History: 1. Coronary artery disease.  Left heart catheterization was done in September 2009 because of exertional chest pain.  It showed an EF of 60% on left ventriculogram, RCA was totally occluded proximally with left-to-right collaterals, the distal left main had a 20% lesion, there was a small ramus with a 60-70% stenosis, and the mid LAD had  serial 60% stenoses with a distal subtotalled LAD.  There was a 50% first diagonal stenosis and a 50% first OM stenosis. There were poor distal targets for bypass surgery and no good interventional target so medical management was planned. Myoview in [place] in 3/11 was normal per report. Lexiscan  myoview (2/13): EF 67%, mild reversible inferior perfusion defect suggestive of mild ischemia.  2. Hypertension: amlodipine caused itching.  3. Peptic ulcer disease. 4. Hysterectomy. 5. History of  HBV, HCV 6. History of chronic cough.  ACEI stopped.  Possible contribution from GERD. 7. History of PVCs and PACs. 8. GERD 9. IBS 10. Depression 11. Hyperlipidemia: CPK elevated while on Crestor.  She thinks pravastatin caused a rash.  12. Obesity 13. h/o pancreatitis 14. Osteoporosis 15. Knee osteoarthritis s/p left TKR.  16. Dyspnea/cough: Echo (3/10): EF 55-60%, no regional wall motion abnormalities, aortic sclerosis.  Echo (3/11) in [place]: EF 60%, grade I diastolic dysfunction, normal RV, no regional wall motion abnormalities, valves are ok.  PFTs (10/10): FVC 103%, FEV1 109%, ratio 74%.  Mild obstruction.  CT chest (6/11) did not show evidence for interstitial lung disease.  Possible asthma or upper airways cough syndrome.  Aspirin and beta blocker have been held as potential causes for bronchospasm.   17. Asthma 18. Rheumatoid arthritis.   Family History: Family History Asthma-sister, mother Family History Emphysema-sister Family History Rheumatoid Arthritis-mother Family History Colon Cancer-maternal aunt Family History Lung Cancer-maternal aunt Pancreatic Cancer-brother Family History C V A / Stroke -mother  Social History: The patient does not drink or smoke (never smoked).   Marital Status:Married lives with husband  Children: Yes Occupation: Retired [employer]  Patient never smoked. (2nd hand smoke exposure x 40 years)  Current Outpatient Prescriptions  Medication Sig Dispense Refill  . albuterol (PROVENTIL) (2.5 MG/3ML) 0.083% nebulizer solution Take 2.5 mg by nebulization every 6 (six) hours as needed.        Marland Kitchen albuterol (VENTOLIN HFA) 108 (90 BASE) MCG/ACT inhaler Inhale 2 puffs into the lungs every 6 (six) hours as needed.        . clopidogrel  (PLAVIX) 75 MG tablet Take 1 tablet (75 mg total) by mouth daily.  90 tablet  3  . Coenzyme Q10 (COQ10 PO) Take by mouth daily.      . Fluticasone-Salmeterol (ADVAIR DISKUS) 250-50 MCG/DOSE AEPB Inhale 1 puff into the lungs 2 (two) times daily.  60 each  5  . hydrochlorothiazide (HYDRODIURIL) 25 MG tablet Take 1 tablet (25 mg total) by mouth daily.  30 tablet  6  . losartan (COZAAR) 100 MG tablet Take 1 tablet (100 mg total) by mouth daily.  90 tablet  1  . nitroGLYCERIN (NITROSTAT) 0.4 MG SL tablet Place 0.4 mg under the tongue every 5 (five) minutes as needed.        . pantoprazole (PROTONIX) 40 MG tablet TAKE 1 TABLET (40 MG TOTAL) BY MOUTH DAILY AS NEEDED.  30 tablet  3  . potassium chloride (K-DUR) 10 MEQ tablet Take 1 tablet (10 mEq total) by mouth daily.  30 tablet  6  . rosuvastatin (CRESTOR) 20 MG tablet Take 1 tablet (20 mg total) by mouth daily.  90 tablet  1  . Vitamin D, Ergocalciferol, (DRISDOL) 50000 UNITS CAPS Take 50,000 Units by mouth every 7 (seven) days.      . traMADol (ULTRAM) 50 MG tablet Take 1 tablet (50 mg total) by mouth 2 (two) times daily as needed for pain.  30 tablet  0   No current facility-administered medications for this visit.   BP 144/90  Pulse 69  Ht 5\' 2"  (1.575 m)  Wt 187 lb (84.823 kg)  BMI 34.19 kg/m2  SpO2 98% General:  Well developed, well nourished, in no acute distress.  Obese.  Neck:  Neck supple, no JVD. No masses, thyromegaly or abnormal cervical nodes. Lungs:  Clear bilaterally to auscultation and percussion. Heart:  Non-displaced PMI, chest non-tender; regular rate and rhythm, S1, S2 without rubs or gallops. 1/6 early systolic ejection murmur at RUSB.  Carotid upstroke normal, no bruit.  Pedals normal pulses. No edema. Abdomen:  Bowel sounds positive; abdomen soft and non-tender without masses, organomegaly, or hernias noted. No hepatosplenomegaly. Extremities:  No clubbing or cyanosis. Neurologic:  Alert and oriented x 3. Psych:  Normal  affect.  Assessment/Plan:  1. CAD: Patient has extensive CAD but no good options for revascularization. Last cath showed occluded RCA with left to right collaterals. Myoview done in 2013 showed mild inferior ischemia that likely correlates to the known occluded RCA. No other perfusion defects. No ischemic symptoms currently. Continue Plavix (wheezes with aspirin), ARB, and Crestor. Cannot tolerate beta blockers either due to bronchospasm.   2. Hyperlipidemia:  Check lipids today, continue Crestor.  3. HTN:  BP has been doing better on HCTZ.   Marca Ancona 08/29/2012 11:10 PM

## 2012-08-29 NOTE — Patient Instructions (Addendum)
Your physician recommends that you have  a FASTING lipid profile.   Your physician wants you to follow-up in: 6 months with Dr Shirlee Latch. (September 2014) You will receive a reminder letter in the mail two months in advance. If you don't receive a letter, please call our office to schedule the follow-up appointment.

## 2012-08-30 ENCOUNTER — Other Ambulatory Visit: Payer: Self-pay

## 2012-08-30 DIAGNOSIS — Z1231 Encounter for screening mammogram for malignant neoplasm of breast: Secondary | ICD-10-CM

## 2012-09-10 ENCOUNTER — Other Ambulatory Visit: Payer: Self-pay | Admitting: Cardiology

## 2012-10-08 ENCOUNTER — Ambulatory Visit
Admission: RE | Admit: 2012-10-08 | Discharge: 2012-10-08 | Disposition: A | Discharge status: Home / Self Care | Payer: PRIVATE HEALTH INSURANCE | Source: Ambulatory Visit

## 2012-10-08 DIAGNOSIS — Z1231 Encounter for screening mammogram for malignant neoplasm of breast: Secondary | ICD-10-CM

## 2012-10-18 ENCOUNTER — Telehealth: Payer: Self-pay | Admitting: Cardiology

## 2012-10-18 NOTE — Telephone Encounter (Signed)
New problem  Calling about a medication that another dr prescribed- she doesn't know if she should be taking it

## 2012-10-18 NOTE — Telephone Encounter (Signed)
Pt called because she said her PCP Dr. Jarold Motto prescrided Amlodipine 2.5 mg once a day and a NTG patch 0.1 once daily on Monday 10/15/12. Pt states the NTG gives the great headache and does not want to use it anymore. Pt's BP today is 145/90 today and pulse 100 beats/ minute. Pt is aware to call her PCP for the NTG causing her to have a great headache and HR. Pt verbalized understanding.

## 2012-11-12 ENCOUNTER — Encounter: Payer: Self-pay | Admitting: Neurology

## 2012-11-12 ENCOUNTER — Ambulatory Visit (INDEPENDENT_AMBULATORY_CARE_PROVIDER_SITE_OTHER): Payer: Medicare Other | Admitting: Neurology

## 2012-11-12 VITALS — BP 133/85 | HR 70 | Temp 98.4°F | Ht 62.0 in | Wt 187.0 lb

## 2012-11-12 DIAGNOSIS — G4730 Sleep apnea, unspecified: Secondary | ICD-10-CM | POA: Insufficient documentation

## 2012-11-12 DIAGNOSIS — I2510 Atherosclerotic heart disease of native coronary artery without angina pectoris: Secondary | ICD-10-CM

## 2012-11-12 DIAGNOSIS — I4949 Other premature depolarization: Secondary | ICD-10-CM

## 2012-11-12 DIAGNOSIS — I493 Ventricular premature depolarization: Secondary | ICD-10-CM

## 2012-11-12 NOTE — Patient Instructions (Addendum)
CPAP and BIPAP CPAP and BIPAP are methods of helping you breathe. CPAP stands for "continuous positive airway pressure." BIPAP stands for "bi-level positive airway pressure." Both CPAP and BIPAP are provided by a small machine with a flexible plastic tube that attaches to a plastic mask that goes over your nose or mouth. Air is blown into your air passages through your nose or mouth. This helps to keep your airways open and helps to keep you breathing well. The amount of pressure that is used to blow the air into your air passages can be set on the machine. The pressure setting is based on your needs. With CPAP, the amount of pressure stays the same while you breathe in and out. With BIPAP, the amount of pressure changes when you inhale and exhale. Your caregiver will recommend whether CPAP or BIPAP would be more helpful for you.  CPAP and BIPAP can be helpful for both adults and children with:  Sleep apnea.  Chronic Obstructive Pulmonary Disease (COPD), a condition like emphysema.  Diseases which weaken the muscles of the chest such as muscular dystrophy or neurological diseases.  Other problems that cause breathing to be weak or difficult. USE OF CPAP OR BIPAP The respiratory therapist or technician will help you get used to wearing the mask. Some people feel claustrophobic (a trapped or closed in feeling) at first, because the mask needs to be fairly snug on your face.   It may help you to get used to the mask gradually, by first holding the mask loosely over your nose or mouth using a low pressure setting on the machine. Gradually the mask can be applied more snugly with increased pressure. You can also gradually increase the amount of time the mask is used.  People with sleep apnea will use the mask and machine at night when they are sleeping. Others, like those with ALS or other breathing difficulties, may need the CPAP or BIPAP all the time.  If the first mask you try does not fit well, or  is uncomfortable, there are other types and sizes that can be tried.  If you tend to breathe through your mouth, a chin strap may be applied to help keep your mouth closed (if you are using a nasal mask).  The CPAP and BIPAP machines have alarms that may sound if the mask comes off or develops a leak.  You should not eat or drink while the CPAP or BIPAP is on. Food or fluids could get pushed into your lungs by the pressure of the CPAP or BIPAP. Sometimes CPAP or BIPAP machines are ordered for home use. If you are going to use the CPAP or BIPAP machine at home, follow these instructions  CPAP or BIPAP machines can be rented or purchased through home health care companies. There are many different brands of machines available. If you rent a machine before purchasing you may find which particular machine works well for you.  Ask questions if there is something you do not understand when picking out your machine.  Place your CPAP or BIPAP machine on a secure table or stand near an electrical outlet.  Know where the On/Off switch is.  Follow your doctor's instructions for how to set the pressure on your machine and when you should use it.  Do not smoke! Tobacco smoke residue can damage the machine. SEEK IMMEDIATE MEDICAL CARE IF:   You have redness or open areas around your nose or mouth.  You have trouble operating  the CPAP or BIPAP machine.  You cannot tolerate wearing the CPAP or BIPAP mask.  You have any questions or concerns. Document Released: 02/19/2004 Document Revised: 08/15/2011 Document Reviewed: 05/20/2008 [facility] Patient Information 2014 [place]. Sleep Apnea  Sleep apnea is a sleep disorder characterized by abnormal pauses in breathing while you sleep. When your breathing pauses, the level of oxygen in your blood decreases. This causes you to move out of deep sleep and into light sleep. As a result, your quality of sleep is poor, and the system that carries your blood  throughout your body (cardiovascular system) experiences stress. If sleep apnea remains untreated, the following conditions can develop:  High blood pressure (hypertension).  Coronary artery disease.  Inability to achieve or maintain an erection (impotence).  Impairment of your thought process (cognitive dysfunction). There are three types of sleep apnea: 1. Obstructive sleep apnea Pauses in breathing during sleep because of a blocked airway. 2. Central sleep apnea Pauses in breathing during sleep because the area of the brain that controls your breathing does not send the correct signals to the muscles that control breathing. 3. Mixed sleep apnea A combination of both obstructive and central sleep apnea. RISK FACTORS The following risk factors can increase your risk of developing sleep apnea:  Being overweight.  Smoking.  Having narrow passages in your nose and throat.  Being of older age.  Being female.  Alcohol use.  Sedative and tranquilizer use.  Ethnicity. Among individuals younger than 35 years, African Americans are at increased risk of sleep apnea. SYMPTOMS   Difficulty staying asleep.  Daytime sleepiness and fatigue.  Loss of energy.  Irritability.  Loud, heavy snoring.  Morning headaches.  Trouble concentrating.  Forgetfulness.  Decreased interest in sex. DIAGNOSIS  In order to diagnose sleep apnea, your caregiver will perform a physical examination. Your caregiver may suggest that you take a home sleep test. Your caregiver may also recommend that you spend the night in a sleep lab. In the sleep lab, several monitors record information about your heart, lungs, and brain while you sleep. Your leg and arm movements and blood oxygen level are also recorded. TREATMENT The following actions may help to resolve mild sleep apnea:  Sleeping on your side.   Using a decongestant if you have nasal congestion.   Avoiding the use of depressants, including  alcohol, sedatives, and narcotics.   Losing weight and modifying your diet if you are overweight. There also are devices and treatments to help open your airway:  Oral appliances. These are custom-made mouthpieces that shift your lower jaw forward and slightly open your bite. This opens your airway.  Devices that create positive airway pressure. This positive pressure "splints" your airway open to help you breathe better during sleep. The following devices create positive airway pressure:  Continuous positive airway pressure (CPAP) device. The CPAP device creates a continuous level of air pressure with an air pump. The air is delivered to your airway through a mask while you sleep. This continuous pressure keeps your airway open.  Nasal expiratory positive airway pressure (EPAP) device. The EPAP device creates positive air pressure as you exhale. The device consists of single-use valves, which are inserted into each nostril and held in place by adhesive. The valves create very little resistance when you inhale but create much more resistance when you exhale. That increased resistance creates the positive airway pressure. This positive pressure while you exhale keeps your airway open, making it easier to breath when you  inhale again.  Bilevel positive airway pressure (BPAP) device. The BPAP device is used mainly in patients with central sleep apnea. This device is similar to the CPAP device because it also uses an air pump to deliver continuous air pressure through a mask. However, with the BPAP machine, the pressure is set at two different levels. The pressure when you exhale is lower than the pressure when you inhale.  Surgery. Typically, surgery is only done if you cannot comply with less invasive treatments or if the less invasive treatments do not improve your condition. Surgery involves removing excess tissue in your airway to create a wider passage way. Document Released: 05/13/2002 Document  Revised: 11/22/2011 Document Reviewed: 09/29/2011 [facility] Patient Information 2014 [place], [place]. Exercise to Lose Weight Exercise and a healthy diet may help you lose weight. Your doctor may suggest specific exercises. EXERCISE IDEAS AND TIPS  Choose low-cost things you enjoy doing, such as walking, bicycling, or exercising to workout videos.  Take stairs instead of the elevator.  Walk during your lunch break.  Park your car further away from work or school.  Go to a gym or an exercise class.  Start with 5 to 10 minutes of exercise each day. Build up to 30 minutes of exercise 4 to 6 days a week.  Wear shoes with good support and comfortable clothes.  Stretch before and after working out.  Work out until you breathe harder and your heart beats faster.  Drink extra water when you exercise.  Do not do so much that you hurt yourself, feel dizzy, or get very short of breath. Exercises that burn about 150 calories:  Running 1  miles in 15 minutes.  Playing volleyball for 45 to 60 minutes.  Washing and waxing a car for 45 to 60 minutes.  Playing touch football for 45 minutes.  Walking 1  miles in 35 minutes.  Pushing a stroller 1  miles in 30 minutes.  Playing basketball for 30 minutes.  Raking leaves for 30 minutes.  Bicycling 5 miles in 30 minutes.  Walking 2 miles in 30 minutes.  Dancing for 30 minutes.  Shoveling snow for 15 minutes.  Swimming laps for 20 minutes.  Walking up stairs for 15 minutes.  Bicycling 4 miles in 15 minutes.  Gardening for 30 to 45 minutes.  Jumping rope for 15 minutes.  Washing windows or floors for 45 to 60 minutes. Document Released: 06/25/2010 Document Revised: 08/15/2011 Document Reviewed: 06/25/2010 [facility] Patient Information 2014 [place], [place].

## 2012-11-12 NOTE — Progress Notes (Signed)
[facility]  Provider:  Dr Vickey Huger Referring Provider: No ref. provider found Primary Care Physician:  Jarome Matin  Patient never returned to CPAP titration after a PSG documented  OSA in 2011.   HPI:  [name] [name] is a 77 y.o. female here as a referral from Dr.Paterson, for a sleep consult. This patient had been seen over 3 years ago and had been diagnosed with sleep apnea , but did not follow up with CPAP . And 2011 the patient endorsed the Epworth sleepiness score at 7 and the Beck Depression Inventory at 4 points at the time she had a body mass index of 34.6 adnexa conference of 15 inches and a blood pressure of 142/85 mm. The patient had an apnea hypotony index of 23.8 and an oxygen nadir of 81% but only for very brief period of time. The apnea was noted to be accentuated during REM sleep. She also had bradytachycardia heart rate varied between 44 beats a minute to 74 beats per minute. Based on this the patient was asked to return for CPAP titration 11/12/2009.    The patient reports today that she still thinks she sleeps actually rather well, she does have frequent or recurrent bouts of bronchitis and has some PRN medication for its treatment. The patient reports that her bedtime is around 10:30 and 11 PM that she falls asleep quickly. She has a 5 to 6  Times nocturia at night, beginning just a couple of months ago. She states normally she doesn't hear her husband going  to the bathroom while getting up , she has a deep sleep. Reports that she snores since he is quickly asleep too he has not been witnessing any apneas or could not report such.  She rises at 5:30 AM and she awakens spontaneously does not meet the alarm. She will to the gym in the morning 3 times a week Monday ,Tuesday, Wednesday. Couple of times she had heartburn or some shortness of breath doing workout, but that resolved quickly and has not been the case again for at least  4weeks now.  He has no  history of shift work She has a daughter with a  OSA related sleep disorder, her sister uses an oxygen concentrator for COPD.  She also states that for a while and her past she was insomnic and treated with Ambien ,which was not becoming. He discontinued that medication and does not need any sleep aids she insists.   The patient endorsed the Epworth sleepiness score of only 7 points, FSS at 9 points, GDS at 2 points, fall risk is 6 points,  5 for meds and 1 for age  . No falls recently .   The patient does not consider asleep poor of poor quality, she denies any excessive daytime sleepiness and does given that the results of a new sleep study may be similar to the one from 3 years ago it is questionable air she would be compliant with treatment. She has neither heart disease nor strokes, she reports.  When I asked her about dr Alford Highland  evaluation for advanced CAD , she relented but  she was unable to correlate  CAD  to "heart disease".       Review of Systems: Out of a complete 14 system review, the patient complains of only the following symptoms, and all other reviewed systems are negative.  CAD , HTN and nocturia, CHF in [place], recurrent bronchitis.     History   Social History  .  Marital Status: Married    Spouse Name: N/A    Number of Children: 4  . Years of Education: 12   Occupational History  . retired    Social History Main Topics  . Smoking status: Never Smoker   . Smokeless tobacco: Never Used     Comment: 2nd hand exposure x40 years   . Alcohol Use: No  . Drug Use: No  . Sexually Active: Not on file   Other Topics Concern  . Not on file   Social History Narrative   Married, lives with husband; has children.   Retired Government social research officer.       Cell # [phone]; okay to leave a message.           Family History  Problem Relation Age of Onset  . Asthma Mother   . Arthritis Mother     rheumatiod arthritis  . Stroke Mother   . Asthma Sister    . Stroke Sister   . COPD Sister   . Emphysema Sister   . Colon cancer Maternal Aunt   . Lung cancer Maternal Aunt   . Pancreatic cancer Brother     Past Medical History  Diagnosis Date  . CAD (coronary artery disease)     L heart cath done 9/09 - exertional chest pain. EF 60%, RCA totally occluded w/L-to-R collateral, distal main had a 20% lesion. small ramus with a 60-70% stenosis, mid LAD had serial 60% stenosis with a distal subtotalled LAD. 50% 1st diagonal and 50% 1st OM stenosis. poor distal targets for bypass surgery/no good interventional target - medical managment planned. Myoview in [place] in 3/11 -nml  . HTN (hypertension)   . PUD (peptic ulcer disease)   . HBV (hepatitis B virus) infection   . HCV (hepatitis C virus)   . Chronic cough     hx; ACEI stoped, possible contribution from GERD  . PVC (premature ventricular contraction)     hx  . PAC (premature atrial contraction)     hx  . GERD (gastroesophageal reflux disease)   . IBS (irritable bowel syndrome)   . Depression   . HLD (hyperlipidemia)     unable to tolaerate crestor due to myalgias and elevated CK. thinks pravastatin casued a rash.   . Obesity   . Pancreatitis     hx  . Osteoporosis   . Osteoarthritis     knee; s/p TKR  . Dyspnea     EF 55-60%, no regional wall motion abnormalities, aortic sclerosis. echo (3/11) in [place]: EF 60%, grade 1 diastolic dysfunction, normal RV, no regional wall motion abntml. valves ok. PFTs (10/10): FVC 103%, ratio 74%. mild obstruction. CT chest (6/11) no evidence for interstitial lung dz. possible asthma/upper airway cough sydrome. aspirin & beta blockers- potential causes for bronchospasm.   . Arthritis   . Sleep apnea     diagnosed in 2011 , did not want CPAP -     Past Surgical History  Procedure Laterality Date  . Cholecystectomy    . Hyesterectomy, unspec. area  1998  . Pancreatic duct stent    . Tubal ligation  1995   . Cardiac catheterization  2009  . Calcification  of right breast-lumps Right 1998    Current Outpatient Prescriptions  Medication Sig Dispense Refill  . albuterol (PROVENTIL) (2.5 MG/3ML) 0.083% nebulizer solution Take 2.5 mg by nebulization every 6 (six) hours as needed.        Marland Kitchen albuterol (VENTOLIN HFA) 108 (90 BASE)  MCG/ACT inhaler Inhale 2 puffs into the lungs every 6 (six) hours as needed.        Marland Kitchen AMLODIPINE BESYLATE PO Take 2.5 mg by mouth daily.      . clopidogrel (PLAVIX) 75 MG tablet Take 1 tablet (75 mg total) by mouth daily.  90 tablet  3  . Coenzyme Q10 (COQ10 PO) Take by mouth daily.      . Fluticasone-Salmeterol (ADVAIR DISKUS) 250-50 MCG/DOSE AEPB Inhale 1 puff into the lungs 2 (two) times daily.  60 each  5  . hydrochlorothiazide (HYDRODIURIL) 25 MG tablet Take 1 tablet (25 mg total) by mouth daily.  30 tablet  6  . HYDROcodone-homatropine (HYDROMET) 5-1.5 MG/5ML syrup Take by mouth every 6 (six) hours as needed for cough.      . hydrOXYzine (ATARAX/VISTARIL) 25 MG tablet Take 25 mg by mouth daily. One every 6 hours as needed for itching, caution on sedation      . losartan (COZAAR) 100 MG tablet TAKE 1 TABLET (100 MG TOTAL) BY MOUTH DAILY.  90 tablet  1  . losartan-hydrochlorothiazide (HYZAAR) 100-25 MG per tablet Take 1 tablet by mouth daily.      . Multiple Minerals-Vitamins (CITRACAL PLUS PO) Take by mouth daily.      . nitroGLYCERIN (NITROSTAT) 0.4 MG SL tablet Place 0.4 mg under the tongue every 5 (five) minutes as needed.        . pantoprazole (PROTONIX) 40 MG tablet TAKE 1 TABLET (40 MG TOTAL) BY MOUTH DAILY AS NEEDED.  30 tablet  5  . potassium chloride (K-DUR) 10 MEQ tablet Take 1 tablet (10 mEq total) by mouth daily.  30 tablet  6  . prednisoLONE 5 MG TABS Take 5 mg by mouth 2 (two) times a week.      . rosuvastatin (CRESTOR) 20 MG tablet Take 1 tablet (20 mg total) by mouth daily.  90 tablet  1  . traMADol (ULTRAM) 50 MG tablet Take 1 tablet (50 mg total) by mouth 2 (two) times daily as needed for pain.  30 tablet   0  . Vitamin D, Ergocalciferol, (DRISDOL) 50000 UNITS CAPS Take 50,000 Units by mouth every 7 (seven) days.       No current facility-administered medications for this visit.    Allergies as of 11/12/2012 - Review Complete 11/12/2012  Allergen Reaction Noted  . Clarithromycin  12/22/2006  . Penicillins  12/22/2006  . Pravastatin  05/26/2010    Vitals: BP 133/85  Pulse 70  Temp(Src) 98.4 F (36.9 C) (Oral)  Ht 5\' 2"  (1.575 m)  Wt 187 lb (84.823 kg)  BMI 34.19 kg/m2 Last Weight:  Wt Readings from Last 1 Encounters:  11/12/12 187 lb (84.823 kg)   Last Height:   Ht Readings from Last 1 Encounters:  11/12/12 5\' 2"  (1.575 m)     Physical exam:  General: The patient is awake, alert and appears not in acute distress. The patient is well groomed. Head: Normocephalic, atraumatic. Neck is supple. Mallampati 3 , neck circumference: 16 inches.  Retrognathia .  Cardiovascular:  Regular rate and rhythm, hear rate 53 bpm, regular , without  murmurs or carotid bruit, and without distended neck veins. Respiratory: Lungs are clear to auscultation. Skin:  Ankle edema, no rash Trunk: BMI is elevated and patient  has normal posture. 187 pounds.   Neurologic exam : The patient is awake and alert, oriented to place and time.  Memory subjective described as intact.  There is a normal  attention span & concentration ability. Speech is fluent without  dysarthria, dysphonia or aphasia.  Mood and affect are appropriate.  Cranial nerves: Pupils are equal and briskly reactive to light. Funduscopic exam without evidence of  edema. Extraocular movements  in vertical and horizontal planes intact and without nystagmus.  Visual fields by finger perimetry are intact. Hearing to finger rub intact.  Facial sensation intact to fine touch. Facial motor strength is symmetric and tongue and uvula moved midline.  Motor exam:   Normal tone and normal muscle bulk and symmetric, normal strength in all  extremities. Right knee pain restricts ROM and pain   Sensory:  Fine touch, pinprick and vibration were tested in all extremities. Proprioception is tested in the upper extremities only. This was  normal.  Coordination: Rapid alternating movements in the fingers/hands is tested and normal. Finger-to-nose maneuver tested and normal without evidence of ataxia, dysmetria or tremor.  Gait and station: Patient walks without assistive device . Strength within normal limits.  Stance is stable and normal.  Tandem gait is  unfragmented. Romberg testing is negative . Deep tendon reflexes: in the  upper and lower extremities are symmetric , attenuated. . Babinski maneuver response is downgoing.   Assessment:  After physical and neurologic examination, review of laboratory studies, imaging, neurophysiology testing and pre-existing records, assessment will be reviewed on the problem list.  Plan:  Treatment plan and additional workup will be reviewed under Problem List.

## 2012-11-12 NOTE — Assessment & Plan Note (Signed)
The CPAP titration study was never completed. The patient did cancel her follow up appointments , she is now here to finally start CPAP, but needs after 3 years a return SPLIT study. Make her early patient.

## 2012-11-19 ENCOUNTER — Ambulatory Visit (INDEPENDENT_AMBULATORY_CARE_PROVIDER_SITE_OTHER): Payer: Medicare Other | Admitting: Neurology

## 2012-11-19 VITALS — BP 158/76 | HR 51 | Ht 62.0 in | Wt 183.0 lb

## 2012-11-19 DIAGNOSIS — G4761 Periodic limb movement disorder: Secondary | ICD-10-CM

## 2012-11-19 DIAGNOSIS — I2510 Atherosclerotic heart disease of native coronary artery without angina pectoris: Secondary | ICD-10-CM

## 2012-11-19 DIAGNOSIS — G4733 Obstructive sleep apnea (adult) (pediatric): Secondary | ICD-10-CM

## 2012-11-19 DIAGNOSIS — G4730 Sleep apnea, unspecified: Secondary | ICD-10-CM

## 2012-11-20 ENCOUNTER — Encounter: Payer: Self-pay | Admitting: Cardiology

## 2012-11-20 ENCOUNTER — Ambulatory Visit (INDEPENDENT_AMBULATORY_CARE_PROVIDER_SITE_OTHER): Payer: PRIVATE HEALTH INSURANCE | Admitting: Cardiology

## 2012-11-20 VITALS — BP 124/84 | HR 59 | Ht 62.0 in | Wt 184.0 lb

## 2012-11-20 DIAGNOSIS — I2510 Atherosclerotic heart disease of native coronary artery without angina pectoris: Secondary | ICD-10-CM

## 2012-11-20 DIAGNOSIS — E785 Hyperlipidemia, unspecified: Secondary | ICD-10-CM

## 2012-11-20 DIAGNOSIS — I10 Essential (primary) hypertension: Secondary | ICD-10-CM

## 2012-11-20 NOTE — Progress Notes (Signed)
Patient ID: [name] [name], female   DOB: 1935-09-02, 77 y.o.   MRN: [id number] PCP: Eloise Harman  77 yo with history of CAD, asthma, and osteoarthritis presents for cardiology followup.  Patient had a long workup for bronchospasm with Dr. Vassie Loll.  It now appears that she is very sensitive to beta blockers, with bronchospasm/dyspnea with even the most beta-1 selective agents.  She is, therefore, no longer on a beta blocker.  Her aspirin was also stopped due to concern that it could have been contributing to bronchospasm.    Since her last visit, she reports she has overall been doing well.  She did have one episode the middle of May of bad pain that started behind her left ear, that radiated down to her chest and down her left arm.  It was associated with nausea but she did not have SOB, diaphoresis, or dizziness.  Luckily, a [place] was already scheduled to come out to see them that same morning.  She saw her PCP, who told her it was not a heart attack and started her on amlodipine and gave her nitro tabs and patches.  She reports that the patch was giving her bad headaches, so her PCP stopped this medicine.  She continues to exercise 3 times a week at the [facility] on the stairclimber, bicycle, and the treadmill.  No exertional chest pain or exertional dyspnea.  BP is normotensive today and has been within normal range when she has checked it at home.  Weight is stable, she reports a total of 5 lb weight loss in the past 1-2 weeks by changing her eating habits.   Labs (6/11): LDL 101, HDL 53 Labs (9/11): K 4.6, creatinine 0.9 Labs (7/12): LDL 82, HDL 57, LFTs normal Labs (4/25): K 3.7, creatinine 0.8 Labs (3/13): K 4.4, creatinine 1.1, LDL 58, HDL 41 Labs (9/13): LDL 63, HDL 43 Labs (2/14): K 4.3, creatinine 1.0 Labs (3/14): LDL 54, HDL 44  Allergies (verified):  1)  ! Pcn 2)  ! Biaxin 3)  ! * Pravastatin 4)  * Contrast Dye  Past Medical History: 1. Coronary artery disease.  Left heart  catheterization was done in September 2009 because of exertional chest pain.  It showed an EF of 60% on left ventriculogram, RCA was totally occluded proximally with left-to-right collaterals, the distal left main had a 20% lesion, there was a small ramus with a 60-70% stenosis, and the mid LAD had  serial 60% stenoses with a distal subtotalled LAD.  There was a 50% first diagonal stenosis and a 50% first OM stenosis. There were poor distal targets for bypass surgery and no good interventional target so medical management was planned. Myoview in [place] in 3/11 was normal per report. Lexiscan myoview (2/13): EF 67%, mild reversible inferior perfusion defect suggestive of mild ischemia.  2. Hypertension: amlodipine caused itching.  3. Peptic ulcer disease. 4. Hysterectomy. 5. History of  HBV, HCV 6. History of chronic cough.  ACEI stopped.  Possible contribution from GERD. 7. History of PVCs and PACs. 8. GERD 9. IBS 10. Depression 11. Hyperlipidemia: CPK elevated while on Crestor.  She thinks pravastatin caused a rash.  12. Obesity 13. h/o pancreatitis 14. Osteoporosis 15. Knee osteoarthritis s/p left TKR.  16. Dyspnea/cough: Echo (3/10): EF 55-60%, no regional wall motion abnormalities, aortic sclerosis.  Echo (3/11) in [place]: EF 60%, grade I diastolic dysfunction, normal RV, no regional wall motion abnormalities, valves are ok.  PFTs (10/10): FVC 103%, FEV1 109%, ratio  74%.  Mild obstruction.  CT chest (6/11) did not show evidence for interstitial lung disease.  Possible asthma or upper airways cough syndrome.  Aspirin and beta blocker have been held as potential causes for bronchospasm. 17. Asthma 18. Rheumatoid arthritis.  19. OSA  Family History: Family History Asthma-sister, mother Family History Emphysema-sister Family History Rheumatoid Arthritis-mother Family History Colon Cancer-maternal aunt Family History Lung Cancer-maternal aunt Pancreatic Cancer-brother Family History  C V A / Stroke -mother  Social History: The patient does not drink or smoke (never smoked).   Marital Status:Married lives with husband  Children: Yes Occupation: Retired [employer]  Patient never smoked. (2nd hand smoke exposure x 40 years)  Current Outpatient Prescriptions  Medication Sig Dispense Refill  . albuterol (PROVENTIL) (2.5 MG/3ML) 0.083% nebulizer solution Take 2.5 mg by nebulization every 6 (six) hours as needed.        Marland Kitchen AMLODIPINE BESYLATE PO Take 2.5 mg by mouth daily.      . clopidogrel (PLAVIX) 75 MG tablet Take 1 tablet (75 mg total) by mouth daily.  90 tablet  3  . Coenzyme Q10 (COQ10 PO) Take by mouth daily.      . Fluticasone-Salmeterol (ADVAIR DISKUS) 250-50 MCG/DOSE AEPB Inhale 1 puff into the lungs 2 (two) times daily.  60 each  5  . hydrochlorothiazide (HYDRODIURIL) 25 MG tablet Take 1 tablet (25 mg total) by mouth daily.  30 tablet  6  . HYDROcodone-homatropine (HYDROMET) 5-1.5 MG/5ML syrup Take by mouth every 6 (six) hours as needed for cough.      . hydrOXYzine (ATARAX/VISTARIL) 25 MG tablet Take 25 mg by mouth daily. One every 6 hours as needed for itching, caution on sedation      . losartan (COZAAR) 100 MG tablet TAKE 1 TABLET (100 MG TOTAL) BY MOUTH DAILY.  90 tablet  1  . losartan-hydrochlorothiazide (HYZAAR) 100-25 MG per tablet Take 1 tablet by mouth daily.      . Multiple Minerals-Vitamins (CITRACAL PLUS PO) Take by mouth daily.      . nitroGLYCERIN (NITROSTAT) 0.4 MG SL tablet Place 0.4 mg under the tongue every 5 (five) minutes as needed.        . pantoprazole (PROTONIX) 40 MG tablet TAKE 1 TABLET (40 MG TOTAL) BY MOUTH DAILY AS NEEDED.  30 tablet  5  . potassium chloride (K-DUR) 10 MEQ tablet Take 1 tablet (10 mEq total) by mouth daily.  30 tablet  6  . prednisoLONE 5 MG TABS Take 5 mg by mouth 2 (two) times a week.      . rosuvastatin (CRESTOR) 20 MG tablet Take 1 tablet (20 mg total) by mouth daily.  90 tablet  1  . traMADol (ULTRAM)  50 MG tablet Take 1 tablet (50 mg total) by mouth 2 (two) times daily as needed for pain.  30 tablet  0  . Vitamin D, Ergocalciferol, (DRISDOL) 50000 UNITS CAPS Take 50,000 Units by mouth every 7 (seven) days.       No current facility-administered medications for this visit.   BP 124/84  Pulse 59  Ht 5\' 2"  (1.575 m)  Wt 184 lb (83.462 kg)  BMI 33.65 kg/m2  SpO2 98% General:  Well developed, well nourished, in no acute distress.  Obese.  Neck:  Neck supple, no JVD. No masses, thyromegaly or abnormal cervical nodes. Lungs:  Clear bilaterally to auscultation and percussion. Heart:  Non-displaced PMI, chest non-tender; regular rate and rhythm, S1, S2 without rubs or gallops. 1/6  early systolic ejection murmur at RUSB.  Carotid upstroke normal, no bruit.  Pedals normal pulses. No edema. Abdomen:  Bowel sounds positive; abdomen soft and non-tender without masses, organomegaly, or hernias noted. No hepatosplenomegaly. Extremities:  No clubbing or cyanosis. Neurologic:  Alert and oriented x 3. Psych:  Normal affect.  Assessment/Plan:  1. CAD: Patient has extensive CAD but no good options for revascularization. Last cath showed occluded RCA with left to right collaterals. Myoview done in 2013 showed mild inferior ischemia that likely correlates to the known occluded RCA. No other perfusion defects. No ischemic symptoms currently. Continue Plavix (wheezes with aspirin), ARB, and Crestor. Cannot tolerate beta blockers either due to bronchospasm.   2. Hyperlipidemia:  Good lipids in 3/14, continue Crestor.  3. HTN:  BP has been doing better on amlodipine.  HCTZ was d/c'ed due to increased urinary frequency.   Alondra Vandeven 11/20/2012 9:58 AM  Patient seen with resident, agree with the above note.  No major changes to therapy today, she is doing well overall.   Marca Ancona 11/20/2012

## 2012-11-20 NOTE — Patient Instructions (Addendum)
Your physician wants you to follow-up in: 6 months with dr Earlean Shawl will receive a reminder letter in the mail two months in advance. If you don't receive a letter, please call our office to schedule the follow-up appointment.

## 2012-11-22 ENCOUNTER — Telehealth: Payer: Self-pay | Admitting: Neurology

## 2012-11-22 DIAGNOSIS — I25810 Atherosclerosis of coronary artery bypass graft(s) without angina pectoris: Secondary | ICD-10-CM

## 2012-11-22 DIAGNOSIS — G4733 Obstructive sleep apnea (adult) (pediatric): Secondary | ICD-10-CM

## 2012-11-22 NOTE — Telephone Encounter (Signed)
i am sorry - the patient already returned for a titration, her baseline study was 6-10- return 6-1-6 . Please disregard my order for a new titration. CD

## 2012-11-22 NOTE — Telephone Encounter (Signed)
Patient developed an AHi of 45 . [employer] set a mnimum for SPIT at 20 ,but given this  patients severe comorbidities I feel she should have been split. Now need to return for a titration.. Order in EPic. Chais Fehringer, MD

## 2012-11-25 ENCOUNTER — Ambulatory Visit (INDEPENDENT_AMBULATORY_CARE_PROVIDER_SITE_OTHER): Payer: Medicare Other | Admitting: Neurology

## 2012-11-25 DIAGNOSIS — I25810 Atherosclerosis of coronary artery bypass graft(s) without angina pectoris: Secondary | ICD-10-CM

## 2012-11-25 DIAGNOSIS — G4733 Obstructive sleep apnea (adult) (pediatric): Secondary | ICD-10-CM

## 2012-11-28 NOTE — Progress Notes (Signed)
See media tab for full report  

## 2012-11-28 NOTE — Progress Notes (Signed)
Sleep Technologist Summary Report ROOM #: 2 REFERRING MD:  Jarome Matin, MD READING MD:  Melvyn Novas, MD  OBSERVATIONS: SPO2:  Stable, stayed within 90% most of the time CO2:  Not ordered EEG:  Artifact due to technical issues during the first part of study, this later resolved and impedences were excellent, no significant alpha intrusion or seizure like activity EKG:  Occasional - frequent (sometimes once or twice every 30 seconds) unifocal PVC's, otherwise NSR. EMG:  Frequent PLMD, some with arousals, some PLM's observed in REM but then dissipated. VIDEO:  Restless behavior due to pain, no significant other activity SLEEP STAGING:  REM rebound, N3 rebound,  SNORE:  Not observed RESPIRATORY:  No significant respiratory events, only 3 hypopneas with 4% desaturations in REM - there was notching seen in airflow and flow limitations as well as 3% desaturations, CPAP was titrated for these events. CPAP: pt tolerated better with EPR, tried multiple settings and EPR combinations, pt seemed to do best at CPAP 8 cm with EPR of 2 IF PATIENT ON CPAP, CURRENT PRESSURE CHECKED BY MANOMETER AS:  N/A ADDITIONAL COMMENTS/OBSERVATIONS FOR MEDICAL RECORD NUMBER Significant technical issues at the beginning of test, lights out was actually 10:15 but we lost almost 2 hours of recording time due to equipment issues.  Pt reported she had pain and that kept her from establishing sleep earlier even though she was very tired.  Let study run longer than usual in order to make up the difference for the time lost at the beginning of recording.

## 2012-11-29 NOTE — Progress Notes (Signed)
OSA and PVCs.

## 2012-12-05 ENCOUNTER — Other Ambulatory Visit: Payer: Self-pay | Admitting: Neurology

## 2012-12-05 DIAGNOSIS — G4733 Obstructive sleep apnea (adult) (pediatric): Secondary | ICD-10-CM

## 2012-12-10 ENCOUNTER — Encounter: Payer: Self-pay | Admitting: *Deleted

## 2012-12-10 ENCOUNTER — Telehealth: Payer: Self-pay | Admitting: *Deleted

## 2012-12-10 NOTE — Telephone Encounter (Signed)
Tried to call patient regarding CPAP titration results and orders to start using CPAP, got no answer at home number and disconnected message on mobile.  Sent patient a letter with her CPAP report explaining that DME company would contact her.  Orders were forwarded to [facility] on Thursday 12/06/2012.  Letter will state patient needs to call for follow up appt as soon as she receives her CPAP so that she can be seen 30-60 days after starting CPAP therapy. -sh

## 2013-03-01 ENCOUNTER — Other Ambulatory Visit: Payer: Self-pay | Admitting: Cardiology

## 2013-04-29 ENCOUNTER — Telehealth: Payer: Self-pay | Admitting: Gastroenterology

## 2013-04-29 NOTE — Telephone Encounter (Signed)
Complaining of nausea and abdominal pain since starting Carafate 2 days ago.  Patient was instructed to discontinue Carafate and to contact Dr. Loreta Ave

## 2013-06-05 ENCOUNTER — Other Ambulatory Visit: Payer: Self-pay | Admitting: Cardiology

## 2013-06-11 ENCOUNTER — Ambulatory Visit (INDEPENDENT_AMBULATORY_CARE_PROVIDER_SITE_OTHER)
Admission: RE | Admit: 2013-06-11 | Discharge: 2013-06-11 | Disposition: A | Discharge status: Home / Self Care | Payer: PRIVATE HEALTH INSURANCE | Source: Ambulatory Visit | Attending: Cardiology | Admitting: Cardiology

## 2013-06-11 ENCOUNTER — Encounter: Payer: Self-pay | Admitting: Cardiology

## 2013-06-11 ENCOUNTER — Ambulatory Visit (INDEPENDENT_AMBULATORY_CARE_PROVIDER_SITE_OTHER): Payer: PRIVATE HEALTH INSURANCE | Admitting: Cardiology

## 2013-06-11 ENCOUNTER — Telehealth: Payer: Self-pay | Admitting: Cardiology

## 2013-06-11 VITALS — BP 130/90 | HR 65 | Ht 61.5 in | Wt 185.0 lb

## 2013-06-11 DIAGNOSIS — R05 Cough: Secondary | ICD-10-CM

## 2013-06-11 DIAGNOSIS — I10 Essential (primary) hypertension: Secondary | ICD-10-CM

## 2013-06-11 DIAGNOSIS — E785 Hyperlipidemia, unspecified: Secondary | ICD-10-CM

## 2013-06-11 DIAGNOSIS — I2510 Atherosclerotic heart disease of native coronary artery without angina pectoris: Secondary | ICD-10-CM

## 2013-06-11 DIAGNOSIS — R059 Cough, unspecified: Secondary | ICD-10-CM

## 2013-06-11 LAB — LIPID PANEL
Cholesterol: 119 mg/dL (ref 0–200)
HDL: 40.7 mg/dL (ref >39.00)
LDL Cholesterol: 57 mg/dL (ref 0–99)
Total CHOL/HDL Ratio: 3
Triglycerides: 109 mg/dL (ref 0.0–149.0)
VLDL: 21.8 mg/dL (ref 0.0–40.0)

## 2013-06-11 NOTE — Patient Instructions (Signed)
Your physician recommends that you have  lab work today--Lipid profile.  A chest x-ray takes a picture of the organs and structures inside the chest, including the heart, lungs, and blood vessels. This test can show several things, including, whether the heart is enlarges; whether fluid is building up in the lungs; and whether pacemaker / defibrillator leads are still in place. TODAY  Your physician recommends that you schedule a follow-up appointment in: 1 month with PA/NP.

## 2013-06-11 NOTE — Telephone Encounter (Signed)
New Message  Pt called for lab results// very anxious// please call

## 2013-06-11 NOTE — Progress Notes (Signed)
Patient ID: [name] [name], female   DOB: 08/01/1935, 78 y.o.   MRN: [id number] PCP: Eloise Harman  78 yo with history of CAD, asthma, and osteoarthritis presents for cardiology followup.  Patient had a long workup for bronchospasm with Dr. Vassie Loll.  It now appears that she is very sensitive to beta blockers, with bronchospasm/dyspnea with even the most beta-1 selective agents.  She is, therefore, no longer on a beta blocker.  Her aspirin was also stopped due to concern that it could have been contributing to bronchospasm.    Over the last week, she has been feeling bad. She has been coughing and wheezing.  She notes dyspnea walking more than short distances.  She does not think that she has had a fever.  She has been sneezing a lot.  Her chest is sore when she coughs but she has not had exertional chest pain.  Prior to about a week ago, she had been doing well, exercising without dyspnea or chest pain.  Weight is stable.  BP is ok.   Labs (6/11): LDL 101, HDL 53 Labs (9/11): K 4.6, creatinine 0.9 Labs (7/12): LDL 82, HDL 57, LFTs normal Labs (8/67): K 3.7, creatinine 0.8 Labs (3/13): K 4.4, creatinine 1.1, LDL 58, HDL 41 Labs (9/13): LDL 63, HDL 43 Labs (2/14): K 4.3, creatinine 1.0  ECG: NSR, LVH, old inferior MI  Allergies (verified):  1)  ! Pcn 2)  ! Biaxin 3)  ! * Pravastatin 4)  * Contrast Dye  Past Medical History: 1. Coronary artery disease.  Left heart catheterization was done in September 2009 because of exertional chest pain.  It showed an EF of 60% on left ventriculogram, RCA was totally occluded proximally with left-to-right collaterals, the distal left main had a 20% lesion, there was a small ramus with a 60-70% stenosis, and the mid LAD had  serial 60% stenoses with a distal subtotalled LAD.  There was a 50% first diagonal stenosis and a 50% first OM stenosis. There were poor distal targets for bypass surgery and no good interventional target so medical management was planned. Myoview  in [place] in 3/11 was normal per report. Lexiscan myoview (2/13): EF 67%, mild reversible inferior perfusion defect suggestive of mild ischemia.  2. Hypertension: amlodipine caused itching.  3. Peptic ulcer disease. 4. Hysterectomy. 5. History of  HBV, HCV 6. History of chronic cough.  ACEI stopped.  Possible contribution from GERD. 7. History of PVCs and PACs. 8. GERD 9. IBS 10. Depression 11. Hyperlipidemia: CPK elevated while on Crestor.  She thinks pravastatin caused a rash.  12. Obesity 13. h/o pancreatitis 14. Osteoporosis 15. Knee osteoarthritis s/p left TKR.  16. Dyspnea/cough: Echo (3/10): EF 55-60%, no regional wall motion abnormalities, aortic sclerosis.  Echo (3/11) in [place]: EF 60%, grade I diastolic dysfunction, normal RV, no regional wall motion abnormalities, valves are ok.  PFTs (10/10): FVC 103%, FEV1 109%, ratio 74%.  Mild obstruction.  CT chest (6/11) did not show evidence for interstitial lung disease.  Possible asthma or upper airways cough syndrome.  Aspirin and beta blocker have been held as potential causes for bronchospasm.   17. Asthma 18. Rheumatoid arthritis.   Family History: Family History Asthma-sister, mother Family History Emphysema-sister Family History Rheumatoid Arthritis-mother Family History Colon Cancer-maternal aunt Family History Lung Cancer-maternal aunt Pancreatic Cancer-brother Family History C V A / Stroke -mother  Social History: The patient does not drink or smoke (never smoked).   Marital Status:Married lives with husband  Children: Yes  Occupation: Retired [employer]  Patient never smoked. (2nd hand smoke exposure x 40 years)  ROS: All systems reviewed and negative except as per HPI  Current Outpatient Prescriptions  Medication Sig Dispense Refill  . albuterol (PROVENTIL) (2.5 MG/3ML) 0.083% nebulizer solution Take 2.5 mg by nebulization every 6 (six) hours as needed.        . clopidogrel (PLAVIX) 75 MG  tablet TAKE 1 TABLET (75 MG TOTAL) BY MOUTH DAILY.  90 tablet  3  . Coenzyme Q10 (COQ10 PO) Take by mouth daily.      . CRESTOR 20 MG tablet TAKE 1 TABLET (20 MG TOTAL) BY MOUTH DAILY.  90 tablet  3  . HYDROcodone-homatropine (HYDROMET) 5-1.5 MG/5ML syrup Take by mouth every 6 (six) hours as needed for cough.      . losartan (COZAAR) 100 MG tablet TAKE 1 TABLET (100 MG TOTAL) BY MOUTH DAILY.  90 tablet  3  . nitroGLYCERIN (NITROSTAT) 0.4 MG SL tablet Place 0.4 mg under the tongue every 5 (five) minutes as needed.        . pantoprazole (PROTONIX) 40 MG tablet TAKE 1 TABLET (40 MG TOTAL) BY MOUTH DAILY AS NEEDED.  30 tablet  0  . prednisoLONE 5 MG TABS Take 5 mg by mouth 2 (two) times a week.      . traMADol (ULTRAM) 50 MG tablet Take 1 tablet (50 mg total) by mouth 2 (two) times daily as needed for pain.  30 tablet  0  . Vitamin D, Ergocalciferol, (DRISDOL) 50000 UNITS CAPS Take 50,000 Units by mouth every 7 (seven) days.       No current facility-administered medications for this visit.   BP 130/90  Pulse 65  Ht 5' 1.5" (1.562 m)  Wt 83.915 kg (185 lb)  BMI 34.39 kg/m2 General:  Well developed, well nourished, in no acute distress.  Obese.  Neck:  Neck supple, no JVD. No masses, thyromegaly or abnormal cervical nodes. Lungs:  Upper airways-type rhonchi and crackles at bases.  Heart:  Non-displaced PMI, chest non-tender; regular rate and rhythm, S1, S2 without rubs or gallops. 1/6 early systolic ejection murmur at RUSB.  Carotid upstroke normal, no bruit.  Pedals normal pulses. No edema. Abdomen:  Bowel sounds positive; abdomen soft and non-tender without masses, organomegaly, or hernias noted. No hepatosplenomegaly. Extremities:  No clubbing or cyanosis. Neurologic:  Alert and oriented x 3. Psych:  Normal affect.  Assessment/Plan:  1. CAD: Patient has extensive CAD but no good options for revascularization. Last cath showed occluded RCA with left to right collaterals. Myoview done in 2013  showed mild inferior ischemia that likely correlates to the known occluded RCA. No other perfusion defects. I do not think that she is having ischemic symptoms currently (suspect current symptoms are related to acute bronchitis and asthma exacerbation). Continue Plavix (wheezes with aspirin), ARB, and Crestor. Cannot tolerate beta blockers either due to bronchospasm.   2. Hyperlipidemia:  Check lipids today, continue Crestor.  3. HTN:  BP ok currently.  4. Acute bronchitis: Patient seems to have acute bronchitis with asthma exacerbation.  She denies fever.  She has extensive rhonchi on lung exam.  - Continue to use nebulized albuterol at home.  - I will get a CXR to look for PNA.  - Followup with PCP  I will have her followup in a month or so once she is over this acute event to see the PA.  Marca Ancona 06/11/2013

## 2013-06-11 NOTE — Telephone Encounter (Signed)
Spoke with patient about CXR results.

## 2013-06-17 ENCOUNTER — Telehealth: Payer: Self-pay | Admitting: Cardiology

## 2013-06-17 NOTE — Telephone Encounter (Signed)
New message  Would like results of lab work, please call and advice.

## 2013-06-17 NOTE — Telephone Encounter (Signed)
Spoke with patient about recent lab results 

## 2013-06-18 ENCOUNTER — Encounter: Payer: Self-pay | Admitting: Adult Health

## 2013-06-18 ENCOUNTER — Ambulatory Visit (INDEPENDENT_AMBULATORY_CARE_PROVIDER_SITE_OTHER): Payer: Medicare Other | Admitting: Adult Health

## 2013-06-18 ENCOUNTER — Ambulatory Visit (INDEPENDENT_AMBULATORY_CARE_PROVIDER_SITE_OTHER)
Admission: RE | Admit: 2013-06-18 | Discharge: 2013-06-18 | Disposition: A | Discharge status: Home / Self Care | Payer: Medicare Other | Source: Ambulatory Visit | Attending: Cardiology | Admitting: Cardiology

## 2013-06-18 VITALS — BP 128/80 | HR 72 | Temp 98.5°F | Ht 61.5 in | Wt 186.6 lb

## 2013-06-18 DIAGNOSIS — R05 Cough: Secondary | ICD-10-CM

## 2013-06-18 DIAGNOSIS — R059 Cough, unspecified: Secondary | ICD-10-CM

## 2013-06-18 DIAGNOSIS — J45909 Unspecified asthma, uncomplicated: Secondary | ICD-10-CM

## 2013-06-18 DIAGNOSIS — E785 Hyperlipidemia, unspecified: Secondary | ICD-10-CM

## 2013-06-18 DIAGNOSIS — I2510 Atherosclerotic heart disease of native coronary artery without angina pectoris: Secondary | ICD-10-CM

## 2013-06-18 MED ORDER — AZITHROMYCIN 250 MG PO TABS: ORAL_TABLET | ORAL | Status: AC

## 2013-06-18 MED ORDER — PREDNISONE 10 MG PO TABS: ORAL_TABLET | ORAL | Status: DC

## 2013-06-18 NOTE — Patient Instructions (Signed)
Zpack take as directed  Mucinex DM Twice daily  As needed  Cough/congestion  Fliuds and rest  Saline nasal rinses As needed   Prednisone taper over next week  Please contact office for sooner follow up if symptoms do not improve or worsen or seek emergency care  Follow up Dr. Vassie Loll  In 6-8 weeks and As needed

## 2013-06-18 NOTE — Progress Notes (Signed)
Subjective:    Patient ID: [name] [name], female    DOB: 1935/11/26, 78 y.o.   MRN: [id number]  HPI  53F never smoker with CAD for FU of reactive airway disease - trigger ? GERD.   10/29/09 - initial  This was of insidious onset - - She had an ER visit during a trip to [place] - echoi nml LV fn, BNP 55, stress myoview nml, given cipro for 'bronchitis.'  Review of CXR 08/10/09 shows bibasal interstitial prominence, lt volume loss with raised hemidiaphragm  CT angio chest 8/09 was neg for PE, stable RUL 56m nodule as compared to 05/2005, no obvious fibrosis. CT sinuses 8/11 nml  She reports good control of heartburn with protonix , had an EGD by Dr MCollene Maresin 11/10  PFTs in 10/10 were essentially nml, no obstruction, nml lung volumes & diffusion.  [facility] Spirometry6/11 shows nml lung function, RAST neg, Labs showed neg ANA, and low ESR. RA factor was elevated.  ER visit on 01/09/10 - absolute eos count 800 ,BNP 69, CXR - cardiomegaly, was on prednisone .  Sep, 2011 >> Referred to Rheum ([place] - favors OA rather than RA, CCP neg, CK was high 709 - muscle cramps, crestor stopped Stopped advair x 2 weeks - 'ran out', beta blockers stopped  Underwent EMG,  06/13/11  Self dc'd advair again, copay of $33, Cardiac meds too expensive  Takes prednisone sporadically rather than as prescribed, has run out of albuterol rescue inhaler  Required prednisone tapers frequently last few months  >>no changes    07/25/2011 Follow up  Returns for follow up . Says she has been doing well since last ov with her breathing. No flare in symptoms , no steroid use . No increased SABA use.  Does have stress myoview later today as she has known CAD .  Pt returns for follow up . Says she is taking Advair most days.  No cough, fever or edema.   a different maintenance inhaler for her. She states that the advair is covered by her insurance, but is still 70 $ per month supply and this  is unaffordable  06/18/2013 Acute OV  Complains of c/o sob increased x 3 wks.,cough-brown,green,very small amt. speck of bld. yesterday, no fcs, wheezing,denies cp or tightness CXR today with NAD .  Patient complains that her cough is not get any better. Over the last 3 weeks. She complains of thick, green mucus, nasal congestion, and postnasal drip. She denies any hemoptysis, orthopnea, PND, or recent travel. She's had no recent antibiotics or hospitalizations. She has been using Mucinex without much relief.  Past Medical History:  1. Coronary artery disease. Left heart catheterization was done in September 2009 because of exertional chest pain. It showed an EF of 60% on left ventriculogram, RCA was totally occluded proximally with left-to-right collaterals>> There were poor distal targets for bypass surgery and no good interventional target so medical management was planned[place].  2. Hypertension.  3. Peptic ulcer disease.  4. Hysterectomy.  5. History of HBV, HCV  6. History of chronic cough. ACEI stopped. Possible contribution from GERD.  7. History of PVCs and PACs.  8. GERD  9. IBS  10. Depression  11. Hyperlipidemia  12. Obesity  13. h/o pancreatitis  14. Osteoporosis  15. Knee osteoarthritis  16. Dyspnea/cough: Echo (3/10): EF 55-60%, no regional wall motion abnormalities, aortic sclerosis. [place]  EF 86%, grade I diastolic dysfunction, normal RV, no regional wall motion abnormalities, valves are ok. PFTs (10/10): FVC 103%, FEV1 109%, ratio 74%. Mild obstruction. CT chest (6/11) did not show evidence for interstitial lung disease.       Review of Systems  Constitutional:   No  weight loss, night sweats,  Fevers, chills, fatigue, or  lassitude.  HEENT:   No headaches,  Difficulty swallowing,  Tooth/dental problems, or  Sore throat,                No sneezing, itching, ear ache,  +nasal congestion, post nasal  drip,   CV:  No chest pain,  Orthopnea, PND, swelling in lower extremities, anasarca, dizziness, palpitations, syncope.   GI  No heartburn, indigestion, abdominal pain, nausea, vomiting, diarrhea, change in bowel habits, loss of appetite, bloody stools.   Resp:  .  No chest wall deformity  Skin: no rash or lesions.  GU: no dysuria, change in color of urine, no urgency or frequency.  No flank pain, no hematuria   MS:  No joint pain or swelling.  No decreased range of motion.  No back pain.  Psych:  No change in mood or affect. No depression or anxiety.  No memory loss.         Objective:   Physical Exam   GEN: A/Ox3; pleasant , NAD, well nourished   HEENT:  [place]/AT,  EACs-clear, TMs-wnl, NOSE-clear, THROAT-clear, no lesions, no postnasal drip or exudate noted.   NECK:  Supple w/ fair ROM; no JVD; normal carotid impulses w/o bruits; no thyromegaly or nodules palpated; no lymphadenopathy.  RESP  Faint exp wheeze no accessory muscle use, no dullness to percussion  CARD:  RRR, no m/r/g  , no peripheral edema, pulses intact, no cyanosis or clubbing.  GI:   Soft & nt; nml bowel sounds; no organomegaly or masses detected.  Musco: Warm bil, no deformities or joint swelling noted.   Neuro: alert, no focal deficits noted.    Skin: Warm, no lesions or rashes        Assessment & Plan:

## 2013-06-18 NOTE — Assessment & Plan Note (Addendum)
Mild flare with URI  cxr w/ nad   Plan  Zpack take as directed (says she is not allergic)  Mucinex DM Twice daily  As needed  Cough/congestion  Fliuds and rest  Saline nasal rinses As needed   Prednisone taper over next week  Please contact office for sooner follow up if symptoms do not improve or worsen or seek emergency care  Follow up Dr. Vassie Loll  In 6-8 weeks and As needed

## 2013-07-09 ENCOUNTER — Encounter: Payer: Self-pay | Admitting: Nurse Practitioner

## 2013-07-09 ENCOUNTER — Ambulatory Visit (INDEPENDENT_AMBULATORY_CARE_PROVIDER_SITE_OTHER): Payer: PRIVATE HEALTH INSURANCE | Admitting: Nurse Practitioner

## 2013-07-09 VITALS — BP 140/70 | HR 89 | Ht 61.5 in | Wt 186.8 lb

## 2013-07-09 DIAGNOSIS — R011 Cardiac murmur, unspecified: Secondary | ICD-10-CM

## 2013-07-09 DIAGNOSIS — I2510 Atherosclerotic heart disease of native coronary artery without angina pectoris: Secondary | ICD-10-CM

## 2013-07-09 NOTE — Progress Notes (Signed)
[name] [name] Date of Birth: 04-Dec-1935 Medical Record #[id number]  History of Present Illness: [name] is seen back today for a one month check. Seen for Dr. Shirlee Latch. She has multiple medical issues. She has known CAD and is felt to best be managed medically since her cath back in 2009. She has poor distal targets for bypass surgery and no lesion amenable to PCI. Her last Myoview was in February of 2013 showed an EF of 67% with mild reversible inferior perfusion defect suggestive of mild ischemia - felt to correlate with her known occluded RCA. She has continued to be managed medically. Her other problems include significant bronchospasm. She is not able to take beta blockers and is not on aspirin because of the bronchospasm. She also has HTN, PUD, GERD, IBS, obesity and PVC/PAC's. Sees Dr. Jarome Matin for her PCP.  Seen last month with a one week of feeling bad - felt to have bronchitis. Referred for CXR and had to have a repeat study due to poor inspiration. Then went on to pulmonary and was treated with antibiotics/steroids.   Comes back today. Here alone. Doing ok for the most part. Notes that she did do a lot of coughing with her bronchitis. Resolved with the steroids and antibiotics. No real chest pain. Notes that she will feel a "pin pricking" sensation thru out her chest at times. Nothing exertional. Not really short of breath. Says she will have some swelling - mostly in the left leg ever since her knee surgery. Tries to not use salt. Tolerating her medicines. BP ok and usually lower at home.   Current Outpatient Prescriptions  Medication Sig Dispense Refill  . albuterol (PROVENTIL) (2.5 MG/3ML) 0.083% nebulizer solution Take 2.5 mg by nebulization every 6 (six) hours as needed.        . clopidogrel (PLAVIX) 75 MG tablet TAKE 1 TABLET (75 MG TOTAL) BY MOUTH DAILY.  90 tablet  3  . Coenzyme Q10 (COQ10 PO) Take by mouth daily.      . CRESTOR 20 MG tablet TAKE 1 TABLET (20 MG  TOTAL) BY MOUTH DAILY.  90 tablet  3  . losartan (COZAAR) 100 MG tablet TAKE 1 TABLET (100 MG TOTAL) BY MOUTH DAILY.  90 tablet  3  . nitroGLYCERIN (NITROSTAT) 0.4 MG SL tablet Place 0.4 mg under the tongue every 5 (five) minutes as needed.        . pantoprazole (PROTONIX) 40 MG tablet TAKE 1 TABLET (40 MG TOTAL) BY MOUTH DAILY AS NEEDED.  30 tablet  0  . traMADol (ULTRAM) 50 MG tablet Take 1 tablet (50 mg total) by mouth 2 (two) times daily as needed for pain.  30 tablet  0  . Vitamin D, Ergocalciferol, (DRISDOL) 50000 UNITS CAPS Take 50,000 Units by mouth every 7 (seven) days.       No current facility-administered medications for this visit.    Allergies  Allergen Reactions  . Ace Inhibitors   . Clarithromycin   . Penicillins   . Pravastatin     REACTION: itching   Past Medical History:  1. Coronary artery disease. Left heart catheterization was done in September 2009 because of exertional chest pain. It showed an EF of 60% on left ventriculogram, RCA was totally occluded proximally with left-to-right collaterals, the distal left main had a 20% lesion, there was a small ramus with a 60-70% stenosis, and the mid LAD had serial 60% stenoses with a distal subtotalled LAD. There was a 50%  first diagonal stenosis and a 50% first OM stenosis. There were poor distal targets for bypass surgery and no good interventional target so medical management was planned. Myoview in [place] in 3/11 was normal per report. Lexiscan myoview (2/13): EF 67%, mild reversible inferior perfusion defect suggestive of mild ischemia.  2. Hypertension: amlodipine caused itching.  3. Peptic ulcer disease.  4. Hysterectomy.  5. History of HBV, HCV  6. History of chronic cough. ACEI stopped. Possible contribution from GERD.  7. History of PVCs and PACs.  8. GERD  9. IBS  10. Depression  11. Hyperlipidemia: CPK elevated while on Crestor. She thinks pravastatin caused a rash.  12. Obesity  13. h/o pancreatitis    14. Osteoporosis  15. Knee osteoarthritis s/p left TKR.  16. Dyspnea/cough: Echo (3/10): EF 55-60%, no regional wall motion abnormalities, aortic sclerosis. Echo (3/11) in [place]: EF 60%, grade I diastolic dysfunction, normal RV, no regional wall motion abnormalities, valves are ok. PFTs (10/10): FVC 103%, FEV1 109%, ratio 74%. Mild obstruction. CT chest (6/11) did not show evidence for interstitial lung disease. Possible asthma or upper airways cough syndrome. Aspirin and beta blocker have been held as potential causes for bronchospasm.  17. Asthma  18. Rheumatoid arthritis.    Past Surgical History  Procedure Laterality Date  . Cholecystectomy    . Hyesterectomy, unspec. area  1998  . Pancreatic duct stent    . Tubal ligation  1995   . Cardiac catheterization  2009  . Calcification of right breast-lumps Right 1998    History  Smoking status  . Never Smoker   Smokeless tobacco  . Never Used    Comment: 2nd hand exposure x40 years     History  Alcohol Use No    Family History  Problem Relation Age of Onset  . Asthma Mother   . Arthritis Mother     rheumatiod arthritis  . Stroke Mother   . Asthma Sister   . Stroke Sister   . COPD Sister   . Emphysema Sister   . Colon cancer Maternal Aunt   . Lung cancer Maternal Aunt   . Pancreatic cancer Brother     Review of Systems: The review of systems is per the HPI.  All other systems were reviewed and are negative.  Physical Exam: BP 140/70  Pulse 89  Ht 5' 1.5" (1.562 m)  Wt 186 lb 12.8 oz (84.732 kg)  BMI 34.73 kg/m2  SpO2 100% Patient is very pleasant and in no acute distress. Looks younger than her stated age. Skin is warm and dry. Color is normal.  HEENT is unremarkable. Normocephalic/atraumatic. PERRL. Sclera are nonicteric. Neck is supple. No masses. No JVD. Lungs are clear. Cardiac exam shows a regular rate and rhythm. + outflow murmur noted. Abdomen is soft. Extremities are without edema. Op scar of the left  knee noted. Gait and ROM are intact. No gross neurologic deficits noted.  Wt Readings from Last 3 Encounters:  07/09/13 186 lb 12.8 oz (84.732 kg)  06/18/13 186 lb 9.6 oz (84.641 kg)  06/11/13 185 lb (83.915 kg)     LABORATORY DATA: EKG today shows sinus rhythm, prior inferior infarct.   Lab Results  Component Value Date   WBC 8.5 07/04/2010   HGB 11.9* 07/04/2010   HCT 35.1* 07/04/2010   PLT 295 07/04/2010   GLUCOSE 92 07/27/2012   CHOL 119 06/11/2013   TRIG 109.0 06/11/2013   HDL 40.70 06/11/2013   LDLCALC 57 06/11/2013   ALT  24 08/29/2011   AST 26 08/29/2011   NA 141 07/27/2012   K 4.3 07/27/2012   CL 110 07/27/2012   CREATININE 1.0 07/27/2012   BUN 22 07/27/2012   CO2 25 07/27/2012   TSH 1.04 08/21/2009   INR 0.93 07/04/2010   Dg Chest 2 View  06/18/2013    IMPRESSION: No acute cardiopulmonary disease.   Electronically Signed   By: Amie Portland M.D.   On: 06/18/2013 11:01   Dg Chest 2 View  06/11/2013    IMPRESSION: 1. The study is limited due to hypo inflation. Mildly increased interstitial markings especially on the left just above the hemidiaphragm may reflect subsegmental atelectasis. There is no focal pneumonia. 2. There is nodular density that projects lateral to the aortic arch overlying the anterior aspect of the left 1st rib at its junction with the sternum. This merits further evaluation with a repeat PA and lateral chest x-ray with deep inspiration as well as with an apical lordotic view when the patient can tolerate the procedure. 3. There is mild prominence of the cardiac silhouette which is stable but there is no evidence of pulmonary edema.   Electronically Signed   By: David  [place]   On: 06/11/2013 14:46   ECHO SUMMARY FROM 2008 - LVEF is approximately 50% with hypokinesis of the distal anterior, distal anterolateral and apical walls. - Frequent PVCs during study. IMPRESSIONS - Frequent PVCs during  study. --------------------------------------------------------------- Prepared and Electronically Authenticated by Dietrich Pates M.D. Confirmed 05-Oct-2006 20:43:50  Myoview Impression from Feb 2013  Exercise Capacity: Lexiscan with low level exercise.  BP Response: Hypertensive blood pressure response.  Clinical Symptoms: nausea  ECG Impression: No significant ST segment change suggestive of ischemia.  Comparison with Prior Nuclear Study: Inferior perfusion defect with stress mildly worse.  Overall Impression: Abnormal stress nuclear study. There is a mild reversible inferior perfusion defect suggestive of ischemia. Normal EF.  Dalton McLean  Mild reversible inferior defect suggestive of ischemia. She has a known occluded RCA. If she has had more chest pain, may consider repeating cath.  Marca Ancona  07/26/2011  4:24 PM      Assessment / Plan:  1. Chronic bronchitis - seems to be resolved.   2. CAD - managed medically - has no good options for revascularization. Not able to take beta blocker or aspirin due to bronchospasm - remains on her Plavix. She is having some atypical discomfort - seems to be more musculoskeletal.   3. HTN - BP lower at home - no change in her current regimen.   4. Swelling - would favor continued salt restriction. Will update her echo in light of the murmur.   Further disposition to follow. Tentatively see Dr. Shirlee Latch in 4 months. Her labs are followed by Dr. Eloise Harman.   Patient is agreeable to this plan and will call if any problems develop in the interim.   Rosalio Macadamia, RN, ANP-C [facility] [address] [place], [place]  [phone]

## 2013-07-09 NOTE — Patient Instructions (Addendum)
Stay on your current medicines  We need to get an ultrasound of your heart because of the murmur I heard today  See Dr. Shirlee Latch in 4 months - we will see you back sooner if your ultrasound is abnormal  Call the [facility] office at [phone] if you have any questions, problems or concerns.   Heart Murmur A heart murmur is an extra sound heard by your health care provider when listening to your heart with a device called a stethoscope. The sound comes from turbulence when blood flows through the heart and may be a "hum" or "whoosh" sound heard when the heart beats. There are two types of heart murmurs:  Innocent murmurs Most people with this type of heart murmur do not have a heart problem. Many children have innocent heart murmurs. Your health care provider may suggest some basic testing to know whether your murmur is an innocent murmur. If an innocent heart murmur is found, there is no need for further tests or treatment and no need to restrict activities or stop playing sports.  Abnormal murmurs These types of murmurs can occur in children and adults. In children, abnormal heart murmurs are typically caused from heart defects that are present at birth (congenital). In adults, abnormal murmurs are usually from heart valve problems caused by disease, infection, or aging. CAUSES  All heart murmurs are a result of an issue with your heart valves. Normally, these valves open to let blood flow through or out of your heart and then shut to keep it from flowing backward. If they do not work properly, you could have:  Regurgitation When blood leaks back through the valve in the wrong direction.  Mitral valve prolapse When the mitral valve of the heart has a loose flap and does not close tightly.  Stenosis When the valve does not open enough and blocks blood flow. SIGNS AND SYMPTOMS  Innocent murmurs do not cause symptoms, and many people with abnormal murmurs may or may  not have symptoms. If symptoms do develop, they may include:  Shortness of breath.  Blue coloring of the skin, especially on the fingertips.  Chest pain.  Palpitations, or feeling a fluttering or skipped heartbeat.  Fainting.  Persistent cough.  Getting tired much faster than expected. DIAGNOSIS  A heart murmur might be heard during a sports physical or during any type of examination. When a murmur is heard, it may suggest a possible problem. When this happens, your health care provider may ask you to see a heart specialist (cardiologist). You may also be asked to have one or more heart tests. In these cases, testing may vary depending on what your health care provider heard. Tests for a heart murmur may include:  Electrocardiogram.  Echocardiogram.  MRI. For children and adults who have an abnormal heart murmur and want to play sports, it is important to complete testing, review test results, and receive recommendations from your health care provider. If heart disease is present, it may not be safe to play. TREATMENT  Innocent murmurs require no treatment or activity restriction. If an abnormal murmur represents a problem with the heart, treatment will depend on the exact nature of the problem. In these cases, medicine or surgery may be needed to treat the problem. HOME CARE INSTRUCTIONS If you want to participate in sports or other types of strenuous physical activity, it is important to discuss this first with your health care provider. If the murmur represents a problem  with the heart and you choose to participate in sports, there is a small chance that a serious problem (including sudden death) could result.  SEEK MEDICAL CARE IF:   You feel that your symptoms are slowly worsening.  You develop any new symptoms that cause concern.  You feel that you are having side effects from any medicines prescribed. SEEK IMMEDIATE MEDICAL CARE IF:   You develop chest pain.  You have  shortness of breath.  You notice that your heart beats irregularly often enough to cause you to worry.  You have fainting spells.  Your symptoms suddenly get worse. Document Released: 06/30/2004 Document Revised: 03/13/2013 Document Reviewed: 01/28/2013 [facility] Patient Information 2014 [place], [place].

## 2013-07-11 ENCOUNTER — Telehealth: Payer: Self-pay | Admitting: Pulmonary Disease

## 2013-07-11 MED ORDER — DOXYCYCLINE HYCLATE 100 MG PO TABS: 100.0000 mg | ORAL_TABLET | Freq: Two times a day (BID) | ORAL | Status: DC

## 2013-07-11 NOTE — Telephone Encounter (Signed)
Doxycycline 100mg  Twice daily  For 7 days #14 , no refills.  mucinex DM Twice daily  As needed  Cough/congestion  Fluids and rest  Please contact office for sooner follow up if symptoms do not improve or worsen or seek emergency care  Follow up as planned in 2 weeks and As needed

## 2013-07-11 NOTE — Telephone Encounter (Signed)
Called and spoke with pt and she stated that yesterday she started back with cough with thick clear sputum and wheezing.   She denies any fever.  Is having some SOB and chills at times.  Pt stated that she just does not feel good.  She was seen by TP on 06/18/2013 and given zpak and pred taper.  Pt stated that she felt better after this medication but now feels that since she has finished the medication she is getting sick again.  Would like something else called in for her.  TP please advise. Thanks  Last ov with TP--06/18/2013 Next ov--07/24/2013  Allergies  Allergen Reactions  . Ace Inhibitors   . Clarithromycin   . Penicillins   . Pravastatin     REACTION: itching    Scheduled Meds: Continuous Infusions: PRN Meds:.

## 2013-07-11 NOTE — Telephone Encounter (Signed)
Called and spoke with pt and she is aware of TP recs.  Pt is aware of doxycycline that has been sent to her pharmacy.  Pt is aware to keep her appt with RA.  Nothing further is needed.

## 2013-07-15 ENCOUNTER — Encounter: Payer: Self-pay | Admitting: Internal Medicine

## 2013-07-15 ENCOUNTER — Ambulatory Visit (INDEPENDENT_AMBULATORY_CARE_PROVIDER_SITE_OTHER): Payer: Medicare Other | Admitting: Internal Medicine

## 2013-07-15 VITALS — BP 144/92 | HR 86 | Temp 98.0°F | Ht 61.5 in | Wt 189.4 lb

## 2013-07-15 DIAGNOSIS — J45909 Unspecified asthma, uncomplicated: Secondary | ICD-10-CM

## 2013-07-15 DIAGNOSIS — R0602 Shortness of breath: Secondary | ICD-10-CM

## 2013-07-15 MED ORDER — LEVOFLOXACIN 500 MG PO TABS: 500.0000 mg | ORAL_TABLET | Freq: Every day | ORAL | Status: DC

## 2013-07-15 MED ORDER — METHYLPREDNISOLONE ACETATE 80 MG/ML IJ SUSP
120.0000 mg | Freq: Once | INTRAMUSCULAR | Status: AC
  Administered 2013-07-15: 120 mg via INTRAMUSCULAR

## 2013-07-15 MED ORDER — PREDNISONE 10 MG PO TABS: ORAL_TABLET | ORAL | Status: DC

## 2013-07-15 NOTE — Patient Instructions (Addendum)
Prednisone 10 mg take  4 each am x 2 days,   2 each am x 2 days,  1 each am x 2 days and stop   Levaquin 500 mg daily x 7 days   Symbicort 160 Take 2 puffs first thing in am and then another 2 puffs about 12 hours later.

## 2013-07-15 NOTE — Progress Notes (Signed)
Subjective:    Patient ID: [name] [name], female    DOB: 11/12/1935, 78 y.o.   MRN: [id number]  HPI  89F never smoker with CAD for FU of reactive airway disease - trigger ? GERD.   10/29/09 - initial  This was of insidious onset - - She had an ER visit during a trip to [place] - echoi nml LV fn, BNP 55, stress myoview nml, given cipro for 'bronchitis.'  Review of CXR 08/10/09 shows bibasal interstitial prominence, lt volume loss with raised hemidiaphragm  CT angio chest 8/09 was neg for PE, stable RUL 80mm nodule as compared to 05/2005, no obvious fibrosis. CT sinuses 8/11 nml  She reports good control of heartburn with protonix , had an EGD by Dr Collene Mares in 11/10  PFTs in 10/10 were essentially nml, no obstruction, nml lung volumes & diffusion.  She had a sleep study at [facility] neurology. Spirometry6/11 shows nml lung function, RAST neg, Labs showed neg ANA, and low ESR. RA factor was elevated.  ER visit on 01/09/10 - absolute eos count 800 ,BNP 69, CXR - cardiomegaly, was on prednisone .  Sep, 2011 >> Referred to Rheum Ouida Sills) - favors OA rather than RA, CCP neg, CK was high 709 - muscle cramps, crestor stopped Stopped advair x 2 weeks - 'ran out', beta blockers stopped  Underwent EMG,  06/13/11  Self dc'd advair again, copay of $33, Cardiac meds too expensive  Takes prednisone sporadically rather than as prescribed, has run out of albuterol rescue inhaler  Required prednisone tapers frequently last few months  >>no changes    07/25/2011 Follow up  Returns for follow up . Says she has been doing well since last ov with her breathing. No flare in symptoms , no steroid use . No increased SABA use.  Does have stress myoview later today as she has known CAD .  Pt returns for follow up . Says she is taking Advair most days.  No cough, fever or edema.   a different maintenance inhaler for her. She states that the advair is covered by her insurance, but is still 70 $ per month supply and this  is unaffordable  06/18/2013 Acute OV  Complains of c/o sob increased x 3 wks.,cough-brown,green,very small amt. speck of bld. yesterday, no fcs, wheezing,denies cp or tightness CXR today with NAD .  Patient complains that her cough is not get any better. Over the last 3 weeks. She complains of thick, green mucus, nasal congestion, and postnasal drip. She denies any hemoptysis, orthopnea, PND, or recent travel. She's had no recent antibiotics or hospitalizations. She has been using Mucinex without much relief. rec Zpack take as directed  Mucinex DM Twice daily  As needed  Cough/congestion  Fliuds and rest  Saline nasal rinses As needed   Prednisone taper over next week    07/15/2013  Acute  ov/Kuulei Kleier re: asthma, poor hfa Chief Complaint  Patient presents with  . Advice Only    Pt c/o SOB, wheezing, prod cough with green mucous X2 weeks.   reports always completely better p prednisone then gradually worse off on saba only which helps less and less as time off prednisone increases despite stating she maintains gerd rx - not able to afford advair so tends to use prn   No obvious day to day or daytime variabilty or assoc   cp or chest tightness,   overt sinus or hb symptoms. No unusual exp hx or h/o childhood pna/ asthma or knowledge  of premature birth.  Sleeping ok without nocturnal  or early am exacerbation  of respiratory  c/o's or need for noct saba. Also denies any obvious fluctuation of symptoms with weather or environmental changes or other aggravating or alleviating factors except as outlined above   Current Medications, Allergies, Complete Past Medical History, Past Surgical History, Family History, and Social History were reviewed in [employer] record.  ROS  The following are not active complaints unless bolded sore throat, dysphagia, dental problems, itching, sneezing,  nasal congestion or excess/ purulent secretions, ear ache,   fever, chills, sweats, unintended  wt loss, pleuritic or exertional cp, hemoptysis,  orthopnea pnd or leg swelling, presyncope, palpitations, heartburn, abdominal pain, anorexia, nausea, vomiting, diarrhea  or change in bowel or urinary habits, change in stools or urine, dysuria,hematuria,  rash, arthralgias, visual complaints, headache, numbness weakness or ataxia or problems with walking or coordination,  change in mood/affect or memory.       Past Medical History:  1. Coronary artery disease. Left heart catheterization was done in September 2009 because of exertional chest pain. It showed an EF of 60% on left ventriculogram, RCA was totally occluded proximally with left-to-right collaterals>> There were poor distal targets for bypass surgery and no good interventional target so medical management was planned. Myoview in [place] in 3/11 was normal per report.  2. Hypertension.  3. Peptic ulcer disease.  4. Hysterectomy.  5. History of HBV, HCV  6. History of chronic cough. ACEI stopped. Possible contribution from GERD.  7. History of PVCs and PACs.  8. GERD  9. IBS  10. Depression  11. Hyperlipidemia  12. Obesity  13. h/o pancreatitis  14. Osteoporosis  15. Knee osteoarthritis  16. Dyspnea/cough: Echo (3/10): EF 55-60%, no regional wall motion abnormalities, aortic sclerosis. Echo (3/11) in [place]: EF 55%, grade I diastolic dysfunction, normal RV, no regional wall motion abnormalities, valves are ok. PFTs (10/10): FVC 103%, FEV1 109%, ratio 74%. Mild obstruction. CT chest (6/11) did not show evidence for interstitial lung disease.             Objective:   Physical Exam  Wt Readings from Last 3 Encounters:  07/15/13 189 lb 6.4 oz (85.911 kg)  07/09/13 186 lb 12.8 oz (84.732 kg)  06/18/13 186 lb 9.6 oz (84.641 kg)       GEN: A/Ox3; pleasant , NAD, well nourished   HEENT:  [place]/AT,  EACs-clear, TMs-wnl, NOSE-clear, THROAT-clear, no lesions, no postnasal drip or exudate noted.   NECK:  Supple w/ fair ROM;  no JVD; normal carotid impulses w/o bruits; no thyromegaly or nodules palpated; no lymphadenopathy.  RESP  Mid exp bilateral wheeze no accessory muscle use, no dullness to percussion  CARD:  RRR, no m/r/g  , no peripheral edema, pulses intact, no cyanosis or clubbing.  GI:   Soft & nt; nml bowel sounds; no organomegaly or masses detected.  Musco: Warm bil, no deformities or joint swelling noted.   Neuro: alert, no focal deficits noted.    Skin: Warm, no lesions or rashes        Assessment & Plan:

## 2013-07-16 NOTE — Assessment & Plan Note (Addendum)
Still having subjective /objective wheeze on gerd and selective BB so will added ics/laba  The proper method of use, as well as anticipated side effects, of a metered-dose inhaler are discussed and demonstrated to the patient. Improved effectiveness after extensive coaching during this visit to a level of approximately    rx trial of symbicort 160 2bid and rx purulent sputum, active wheeze with pred x 6 and levaquin x 7    Each maintenance medication was reviewed in detail including most importantly the difference between maintenance and as needed and under what circumstances the prns are to be used.  Please see instructions for details which were reviewed in writing and the patient given a copy.

## 2013-07-17 ENCOUNTER — Other Ambulatory Visit: Payer: Self-pay | Admitting: Cardiology

## 2013-07-17 ENCOUNTER — Telehealth: Payer: Self-pay | Admitting: Cardiology

## 2013-07-17 NOTE — Telephone Encounter (Signed)
Patient called no answer.Unable to leave a message no answering machine

## 2013-07-17 NOTE — Telephone Encounter (Signed)
Returned call to patient no answer.Unable to leave a message. 

## 2013-07-17 NOTE — Telephone Encounter (Signed)
New message     Have medication prescribed by another doctor---want to make sure it is ok to take.  Pls advise

## 2013-07-18 ENCOUNTER — Telehealth: Payer: Self-pay | Admitting: Pulmonary Disease

## 2013-07-18 MED ORDER — DOXYCYCLINE HYCLATE 100 MG PO TABS: 100.0000 mg | ORAL_TABLET | Freq: Two times a day (BID) | ORAL | Status: DC

## 2013-07-18 NOTE — Telephone Encounter (Signed)
Sorry to hear that because running out of options List the levaquin as allergy Doxy 100 mg bid x 10 days

## 2013-07-18 NOTE — Telephone Encounter (Signed)
Spoke with the pt and notified of recs per MW She verbalized understanding  Rx was sent to pharm and allergy list updated

## 2013-07-18 NOTE — Telephone Encounter (Signed)
Patient states she is taking levoquin for asthmatic bronchitis.  Now has whelps, and continues to have breathing difficulties. Wants to make sure antibiotic is not interacting with any of her heart medications.  Told her I do not believe this is a interaction with another medication.  Also verified this with pharmacist, Kennon Rounds.  Instructed patient to call the pulmonary Dr this am to discuss.

## 2013-07-18 NOTE — Telephone Encounter (Signed)
Spoke with pt. She saw MW on 07/15/13 and was given Levquin and Prednisone. Levaquin is causing a red rash, headache and sores in her mouth. Advised pt to not take any more of the abx until she heard back from [place].  Per Dr. Vassie Loll, this message should go to [facility].  MW - please advise. Thanks.

## 2013-07-18 NOTE — Telephone Encounter (Signed)
New message   Patient has questions regarding medication she taken . C/O side effect from some medication that causing her to break -out .

## 2013-07-18 NOTE — Telephone Encounter (Signed)
ATC NA at home number.  Fast busy signal on mobile number.  Will try back

## 2013-07-24 ENCOUNTER — Encounter: Payer: Self-pay | Admitting: Pulmonary Disease

## 2013-07-24 ENCOUNTER — Ambulatory Visit (INDEPENDENT_AMBULATORY_CARE_PROVIDER_SITE_OTHER): Payer: Medicare Other | Admitting: Pulmonary Disease

## 2013-07-24 VITALS — BP 124/78 | HR 62 | Temp 97.9°F | Ht 61.5 in | Wt 191.4 lb

## 2013-07-24 DIAGNOSIS — J45909 Unspecified asthma, uncomplicated: Secondary | ICD-10-CM

## 2013-07-24 NOTE — Progress Notes (Signed)
   Subjective:    Patient ID: [name] [name], female    DOB: 10/04/1935, 78 y.o.   MRN: [id number]  HPI  70F never smoker with CAD for FU of reactive airway disease - trigger ? GERD & mild AS  10/29/09 - initial  This was of insidious onset - - She had an ER visit during a trip to [place] - echoi nml LV fn, BNP 55, stress myoview nml, given cipro for 'bronchitis.'  CXR 08/10/09 shows bibasal interstitial prominence, lt volume loss with raised hemidiaphragm  CT angio chest 8/09 was neg for PE, stable RUL 22m nodule as compared to 05/2005, no obvious fibrosis. CT sinuses 8/11 nml  She reports good control of heartburn with protonix , had an EGD by Dr MCollene Maresin 11/10  PFTs in 10/10 were essentially nml, no obstruction, nml lung volumes & diffusion.  [facility] Spirometry 6/11 shows nml lung function, RAST neg, Labs showed neg ANA, and low ESR. RA factor was elevated.  ER visit on 01/09/10 - absolute eos count 800 ,BNP 69, CXR - cardiomegaly, was on prednisone .  Sep, 2011 >> Referred to Rheum (Ouida Sills - favors OA rather than RA, CCP neg, CK was high 709 - muscle cramps, crestor stopped Stopped advair x 2 weeks - 'ran out', beta blockers stopped  Underwent EMG,   07/24/2013  06/18/2013 Acute OV - Zpack ,Prednisone taper   07/15/2013 Acute ov/Wert -reports always completely better p prednisone then gradually worse off on saba only which helps less and less as time off prednisone increases despite stating she maintains gerd rx - not able to afford advair so tends to use prn   >> added symbicort Levaquin caused a red rash, headache and sores in her mouth >> changed to doxy Today, back to baseline Spirometry no airway obsn, FEV1 97% -1.53 Rpt echo scheduled by cards    Review of Systems neg for any significant sore throat, dysphagia, itching, sneezing, nasal congestion or excess/ purulent secretions, fever, chills, sweats, unintended wt loss, pleuritic or  exertional cp, hempoptysis, orthopnea pnd or change in chronic leg swelling. Also denies presyncope, palpitations, heartburn, abdominal pain, nausea, vomiting, diarrhea or change in bowel or urinary habits, dysuria,hematuria, rash, arthralgias, visual complaints, headache, numbness weakness or ataxia.     Objective:   Physical Exam  Gen. Pleasant, well-nourished, in no distress ENT - no lesions, no post nasal drip Neck: No JVD, no thyromegaly, no carotid bruits Lungs: no use of accessory muscles, no dullness to percussion, clear without rales or rhonchi  Cardiovascular: Rhythm regular, heart sounds  normal, no murmurs or gallops, no peripheral edema Musculoskeletal: No deformities, no cyanosis or clubbing        Assessment & Plan:

## 2013-07-24 NOTE — Assessment & Plan Note (Signed)
Mild persistent Stay on symbicort twice daily, we will decrease in 3 months if quiescent to inhaled steroid alone Emphasized compliance

## 2013-07-24 NOTE — Patient Instructions (Signed)
Stay on symbicort twice daily, we will decrease in 3 months Breathing test

## 2013-07-31 ENCOUNTER — Other Ambulatory Visit (HOSPITAL_COMMUNITY): Payer: PRIVATE HEALTH INSURANCE

## 2013-08-14 ENCOUNTER — Ambulatory Visit (HOSPITAL_COMMUNITY): Payer: Medicare Other | Attending: Cardiovascular Disease | Admitting: Cardiology

## 2013-08-14 ENCOUNTER — Encounter: Payer: Self-pay | Admitting: Cardiovascular Disease

## 2013-08-14 DIAGNOSIS — I2510 Atherosclerotic heart disease of native coronary artery without angina pectoris: Secondary | ICD-10-CM | POA: Insufficient documentation

## 2013-08-14 DIAGNOSIS — R011 Cardiac murmur, unspecified: Secondary | ICD-10-CM | POA: Insufficient documentation

## 2013-08-14 NOTE — Progress Notes (Signed)
Echo performed. 

## 2013-08-19 ENCOUNTER — Other Ambulatory Visit: Payer: Self-pay | Admitting: Cardiology

## 2013-09-10 ENCOUNTER — Other Ambulatory Visit: Payer: Self-pay

## 2013-09-10 DIAGNOSIS — Z1231 Encounter for screening mammogram for malignant neoplasm of breast: Secondary | ICD-10-CM

## 2013-09-20 ENCOUNTER — Other Ambulatory Visit: Payer: Self-pay | Admitting: Cardiology

## 2013-10-16 ENCOUNTER — Ambulatory Visit
Admission: RE | Admit: 2013-10-16 | Discharge: 2013-10-16 | Disposition: A | Discharge status: Home / Self Care | Payer: Medicare Other | Source: Ambulatory Visit

## 2013-10-16 ENCOUNTER — Encounter (INDEPENDENT_AMBULATORY_CARE_PROVIDER_SITE_OTHER): Payer: Self-pay

## 2013-10-16 DIAGNOSIS — Z1231 Encounter for screening mammogram for malignant neoplasm of breast: Secondary | ICD-10-CM

## 2013-10-25 ENCOUNTER — Other Ambulatory Visit: Payer: Self-pay | Admitting: Cardiology

## 2013-11-04 ENCOUNTER — Ambulatory Visit (INDEPENDENT_AMBULATORY_CARE_PROVIDER_SITE_OTHER): Payer: Medicare Other | Admitting: Pulmonary Disease

## 2013-11-04 ENCOUNTER — Encounter: Payer: Self-pay | Admitting: Pulmonary Disease

## 2013-11-04 VITALS — BP 132/82 | HR 70 | Temp 97.1°F | Ht 61.0 in | Wt 188.2 lb

## 2013-11-04 DIAGNOSIS — J45909 Unspecified asthma, uncomplicated: Secondary | ICD-10-CM

## 2013-11-04 MED ORDER — PREDNISONE 10 MG PO TABS: ORAL_TABLET | ORAL | Status: DC

## 2013-11-04 NOTE — Patient Instructions (Signed)
Prednisone 10 mg tabs  Take 2 tabs daily with food x 5ds, then 1 tab daily with food x 5ds then STOP - [facility] at [facility] Stay on symbicort 2 puffs twice daily Use albuterol as needed only for wheezing Take protonix daily x 6 weeks -make appt with Dr Loreta Ave for reflux

## 2013-11-04 NOTE — Progress Notes (Signed)
   Subjective:    Patient ID: JAMIELEE MCHALE, female    DOB: [birth year], 78 y.o.   MRN: [id number]  HPI  42F never smoker with CAD for FU of reactive airway disease - trigger ? GERD & mild AS   10/29/09 - initial  This was of insidious onset - - She had an ER visit during a trip to [place] - echo nml LV fn, BNP 55, stress myoview nml, given cipro for 'bronchitis.'  CXR 08/10/09 shows bibasal interstitial prominence, lt volume loss with raised hemidiaphragm  CT angio chest 01/2008 was neg for PE, stable RUL 15mm nodule as compared to 05/2005, no obvious fibrosis. CT sinuses 8/11 nml  She reports intermittent heartburn with protonix , had an EGD by Dr Collene Mares in 11/10  PFTs in 10/10 were essentially nml, no obstruction, nml lung volumes & diffusion.  She had a sleep study at [facility] neurology. Spirometry 6/11 shows nml lung function, RAST neg, Labs showed neg ANA, and low ESR. RA factor was elevated.  ER visit on 01/09/10 - absolute eos count 800 ,BNP 69, CXR - cardiomegaly, was on prednisone .  Sep, 2011 >> Referred to Rheum Ouida Sills) - favors OA rather than RA, CCP neg, CK was high 709 - muscle cramps, crestor stopped Stopped advair x 2 weeks - 'ran out', beta blockers stopped Underwent EMG   06/18/2013 Acute OV - Zpack ,Prednisone taper  07/15/2013 Acute ov/Wert -reports always completely better p prednisone then gradually worse off on saba only which helps less and less as time off prednisone increases despite stating she maintains gerd rx - not able to afford advair so tends to use prn  >> added symbicort  Levaquin caused a red rash, headache and sores in her mouth >> changed to doxy   11/04/2013  Chief Complaint  Patient presents with  . Follow-up    Pt states she is still wheezing. Pt finished zpak and recieved a shot, pt unsure of injection, at family doctor 2 weeks ago. Pt c/o increase SOB and productive cough with thick white mucous and c/o intermittent midsternal CP .   She developed  fever and cough 2 weeks ago, took a Z-Pak and a shot of? Solu-Medrol. Should he takes Protonix when necessary  07/24/2013 Spirometry no airway obsn, FEV1 97% -1.53  Rpt echo  - mild AS   Review of Systems neg for any significant sore throat, dysphagia, itching, sneezing, nasal congestion or excess/ purulent secretions, fever, chills, sweats, unintended wt loss, pleuritic or exertional cp, hempoptysis, orthopnea pnd or change in chronic leg swelling. Also denies presyncope, palpitations, heartburn, abdominal pain, nausea, vomiting, diarrhea or change in bowel or urinary habits, dysuria,hematuria, rash, arthralgias, visual complaints, headache, numbness weakness or ataxia.     Objective:   Physical Exam  Gen. Pleasant, well-nourished, in no distress, normal affect ENT - no lesions, no post nasal drip Neck: No JVD, no thyromegaly, no carotid bruits Lungs: no use of accessory muscles, no dullness to percussion, bibasal rales ,no rhonchi  Cardiovascular: Rhythm regular, heart sounds  normal, no murmurs or gallops, no peripheral edema Abdomen: soft and non-tender, no hepatosplenomegaly, BS normal. Musculoskeletal: No deformities, no cyanosis or clubbing Neuro:  alert, non focal       Assessment & Plan:

## 2013-11-04 NOTE — Assessment & Plan Note (Signed)
Prednisone 10 mg tabs  Take 2 tabs daily with food x 5ds, then 1 tab daily with food x 5ds then STOP - [facility] at [place] Stay on symbicort 2 puffs twice daily Use albuterol as needed only for wheezing Take protonix daily x 6 weeks -make appt with Dr mann for reflux 

## 2013-11-14 ENCOUNTER — Telehealth: Payer: Self-pay | Admitting: Pulmonary Disease

## 2013-11-14 MED ORDER — ALBUTEROL SULFATE (2.5 MG/3ML) 0.083% IN NEBU: 2.5000 mg | INHALATION_SOLUTION | Freq: Four times a day (QID) | RESPIRATORY_TRACT | Status: DC | PRN

## 2013-11-14 NOTE — Telephone Encounter (Signed)
Called and spoke with pt and she stated that she needs the refill of the albuterol for the nebulizer to be sent to [facility] on [address].  This has been sent to the pharmacy and pt is aware.

## 2013-11-21 ENCOUNTER — Telehealth: Payer: Self-pay | Admitting: Pulmonary Disease

## 2013-11-21 MED ORDER — PREDNISONE 10 MG PO TABS: ORAL_TABLET | ORAL | Status: DC

## 2013-11-21 NOTE — Telephone Encounter (Signed)
pred 10 mg -Take 4 tabs  daily with food x 4 days, then 3 tabs daily x 4 days, then 2 tabs daily x 4 days, then 1 tab daily x4 days then stop. #40

## 2013-11-21 NOTE — Telephone Encounter (Signed)
Pt aware of recs. RX called in. Nothing further needed 

## 2013-11-21 NOTE — Telephone Encounter (Signed)
Per 11/04/13 OV w/ RA: Patient Instructions      Prednisone 10 mg tabs  Take 2 tabs daily with food x 5ds, then 1 tab daily with food x 5ds then STOP - [facility] at [facility] Stay on symbicort 2 puffs twice daily Use albuterol as needed only for wheezing Take protonix daily x 6 weeks -make appt with Dr Loreta Ave for reflux  --  Spoke with pt. C/o prod cough w/ white phlem, wheezing, SOB no change. NO chest tx. Pt is requesting a round of prednisone. Please advise RA thanks  Allergies  Allergen Reactions  . Ace Inhibitors   . Clarithromycin   . Levaquin [Levofloxacin In D5w]     Rash, HA  . Penicillins   . Pravastatin     REACTION: itching

## 2013-12-09 ENCOUNTER — Other Ambulatory Visit: Payer: Self-pay | Admitting: Cardiology

## 2013-12-23 ENCOUNTER — Other Ambulatory Visit (INDEPENDENT_AMBULATORY_CARE_PROVIDER_SITE_OTHER): Payer: Medicare Other

## 2013-12-23 ENCOUNTER — Ambulatory Visit (INDEPENDENT_AMBULATORY_CARE_PROVIDER_SITE_OTHER)
Admission: RE | Admit: 2013-12-23 | Discharge: 2013-12-23 | Disposition: A | Discharge status: Home / Self Care | Payer: Medicare Other | Source: Ambulatory Visit | Attending: Internal Medicine | Admitting: Internal Medicine

## 2013-12-23 ENCOUNTER — Ambulatory Visit (INDEPENDENT_AMBULATORY_CARE_PROVIDER_SITE_OTHER): Payer: Medicare Other | Admitting: Internal Medicine

## 2013-12-23 ENCOUNTER — Encounter (INDEPENDENT_AMBULATORY_CARE_PROVIDER_SITE_OTHER): Payer: Self-pay

## 2013-12-23 ENCOUNTER — Encounter: Payer: Self-pay | Admitting: Internal Medicine

## 2013-12-23 VITALS — BP 114/78 | HR 84 | Temp 99.0°F | Ht 61.0 in | Wt 190.4 lb

## 2013-12-23 DIAGNOSIS — R0609 Other forms of dyspnea: Secondary | ICD-10-CM

## 2013-12-23 DIAGNOSIS — R0600 Dyspnea, unspecified: Secondary | ICD-10-CM

## 2013-12-23 DIAGNOSIS — R0989 Other specified symptoms and signs involving the circulatory and respiratory systems: Secondary | ICD-10-CM

## 2013-12-23 DIAGNOSIS — J45909 Unspecified asthma, uncomplicated: Secondary | ICD-10-CM

## 2013-12-23 LAB — CBC WITH DIFFERENTIAL/PLATELET
Basophils Absolute: 0 10*3/uL (ref 0.0–0.1)
Basophils Relative: 0.4 % (ref 0.0–3.0)
Eosinophils Absolute: 0.4 10*3/uL (ref 0.0–0.7)
Eosinophils Relative: 5.6 % — ABNORMAL HIGH (ref 0.0–5.0)
HCT: 37.6 % (ref 36.0–46.0)
Hemoglobin: 12.8 g/dL (ref 12.0–15.0)
Lymphocytes Relative: 33.7 % (ref 12.0–46.0)
Lymphs Abs: 2.2 10*3/uL (ref 0.7–4.0)
MCHC: 33.9 g/dL (ref 30.0–36.0)
MCV: 92.3 fl (ref 78.0–100.0)
Monocytes Absolute: 0.7 10*3/uL (ref 0.1–1.0)
Monocytes Relative: 11.1 % (ref 3.0–12.0)
Neutro Abs: 3.2 10*3/uL (ref 1.4–7.7)
Neutrophils Relative %: 49.2 % (ref 43.0–77.0)
Platelets: 175 10*3/uL (ref 150.0–400.0)
RBC: 4.08 Mil/uL (ref 3.87–5.11)
RDW: 13.1 % (ref 11.5–15.5)
WBC: 6.5 10*3/uL (ref 4.0–10.5)

## 2013-12-23 LAB — BASIC METABOLIC PANEL
BUN: 13 mg/dL (ref 6–23)
CO2: 26 mEq/L (ref 19–32)
Calcium: 9 mg/dL (ref 8.4–10.5)
Chloride: 111 mEq/L (ref 96–112)
Creatinine, Ser: 0.9 mg/dL (ref 0.4–1.2)
GFR: 81.37 mL/min (ref >60.00)
Glucose, Bld: 87 mg/dL (ref 70–99)
Potassium: 3.7 mEq/L (ref 3.5–5.1)
Sodium: 143 mEq/L (ref 135–145)

## 2013-12-23 LAB — TSH: TSH: 1.08 u[IU]/mL (ref 0.35–4.50)

## 2013-12-23 LAB — BRAIN NATRIURETIC PEPTIDE: Pro B Natriuretic peptide (BNP): 43 pg/mL (ref 0.0–100.0)

## 2013-12-23 MED ORDER — TRAMADOL HCL 50 MG PO TABS: 50.0000 mg | ORAL_TABLET | Freq: Two times a day (BID) | ORAL | Status: DC | PRN

## 2013-12-23 MED ORDER — PREDNISONE 10 MG PO TABS: ORAL_TABLET | ORAL | Status: DC

## 2013-12-23 MED ORDER — FAMOTIDINE 20 MG PO TABS: ORAL_TABLET | ORAL | Status: DC

## 2013-12-23 NOTE — Progress Notes (Signed)
Subjective:    Patient ID: [name] [name], female    DOB: 1935/09/29, 78 y.o.   MRN: [id number]  HPI  38F never smoker with CAD for FU of reactive airway disease - trigger ? GERD & mild AS   10/29/09 - initial  This was of insidious onset - - She had an ER visit during a trip to [place] - echo nml LV fn, BNP 55, stress myoview nml, given cipro for 'bronchitis.'  CXR 08/10/09 shows bibasal interstitial prominence, lt volume loss with raised hemidiaphragm  CT angio chest 01/2008 was neg for PE, stable RUL 46mm nodule as compared to 05/2005, no obvious fibrosis. CT sinuses 8/11 nml  She reports intermittent heartburn with protonix , had an EGD by Dr Collene Mares in 11/10  PFTs in 10/10 were essentially nml, no obstruction, nml lung volumes & diffusion.  She had a sleep study at [facility]. Spirometry 6/11 shows nml lung function, RAST neg, Labs showed neg ANA, and low ESR. RA factor was elevated.  ER visit on 01/09/10 - absolute eos count 800 ,BNP 69, CXR - cardiomegaly, was on prednisone .  Sep, 2011 >> Referred to Rheum O[facility] Sills) - favors OA rather than RA, CCP neg, CK was high 709 - muscle cramps, crestor stopped Stopped advair x 2 weeks - 'ran out', beta blockers stopped Underwent EMG   06/18/2013 Acute OV - Zpack ,Prednisone taper  07/15/2013 Acute ov/Desha Bitner -reports always completely better p prednisone then gradually worse off on saba only which helps less and less as time off prednisone increases despite stating she maintains gerd rx - not able to afford advair so tends to use prn  >> added symbicort  Levaquin caused a red rash, headache and sores in her mouth >> changed to doxy   11/04/2013 Chief Complaint  Patient presents with  . Follow-up    Pt states she is still wheezing. Pt finished zpak and recieved a shot, pt unsure of injection, at family doctor 2 weeks ago. Pt c/o increase SOB and productive cough with thick white mucous and c/o intermittent midsternal CP .  She developed fever  and cough 2 weeks ago, took a Z-Pak and a shot of? Solu-Medrol. Says she takes Protonix when necessary rec Prednisone 10 mg tabs  Take 2 tabs daily with food x 5ds, then 1 tab daily with food x 5ds then STOP - [facility] Stay on symbicort 2 puffs twice daily Use albuterol as needed only for wheezing Take protonix daily x 6 weeks -make appt with Dr Collene Mares for reflux   12/23/2013 acute extended ov/Lissy Deuser re: asthma exac  Chief Complaint  Patient presents with  . Acute Visit    Pt c/o increased cough, DOE and fatigue x 3 wks. Cough is prod with minimal clear sputum.    better while on prednisone, but husband took her pill  symbicort only took one pff daily "I thought that's what 2 twice daily meant" protonix 40 mg 30-60 min breakfast Sob varies from at rest to just with exertion depending how recently she took pred as does saba dependency    No obvious patterns in day to day or daytime variabilty or assoc  cp or chest tightness, subjective wheeze overt sinus or hb symptoms. No unusual exp hx or h/o childhood pna/ asthma or knowledge of premature birth.  Sleeping ok without nocturnal  or early am exacerbation  of respiratory  c/o's or need for noct saba. Also denies any obvious fluctuation of symptoms with  weather or environmental changes or other aggravating or alleviating factors except as outlined above   Current Medications, Allergies, Complete Past Medical History, Past Surgical History, Family History, and Social History were reviewed in [employer] record.  ROS  The following are not active complaints unless bolded sore throat, dysphagia, dental problems, itching, sneezing,  nasal congestion or excess/ purulent secretions, ear ache,   fever, chills, sweats, unintended wt loss, pleuritic or exertional cp, hemoptysis,  orthopnea pnd or leg swelling, presyncope, palpitations, heartburn, abdominal pain, anorexia, nausea, vomiting, diarrhea  or change in bowel or  urinary habits, change in stools or urine, dysuria,hematuria,  rash, arthralgias, visual complaints, headache, numbness weakness or ataxia or problems with walking or coordination,  change in mood/affect or memory.         07/24/2013 Spirometry no airway obsn, FEV1 97% -1.53  Rpt echo  - mild AS         Objective:  Physical Exam  Wt Readings from Last 3 Encounters:  12/23/13 190 lb 6.4 oz (86.365 kg)  11/04/13 188 lb 3.2 oz (85.367 kg)  07/24/13 191 lb 6.4 oz (86.818 kg)       Gen. Pleasant, well-nourished, in no distress, normal affect ENT - no lesions, no post nasal drip Neck: No JVD, no thyromegaly, no carotid bruits Lungs: no use of accessory muscles, distant insp and exp rhonchi bilaterally  Cardiovascular: Rhythm regular, heart sounds  normal, no murmurs or gallops, no peripheral edema Abdomen: soft and non-tender, no hepatosplenomegaly, BS normal. Musculoskeletal: No deformities, no cyanosis or clubbing Neuro:  alert, non focal   CXR  12/23/2013 :  Stable mild to moderate cardiac enlargement. Stable uncoiling of the aorta. Vascular pattern is normal. Lungs are clear. No pleural effusions.   Recent Labs Lab 12/23/13 1304  NA 143  K 3.7  CL 111  CO2 26  BUN 13  CREATININE 0.9  GLUCOSE 87    Recent Labs Lab 12/23/13 1304  HGB 12.8  HCT 37.6  WBC 6.5  PLT 175.0      Lab Results  Component Value Date   PROBNP 43.0 12/23/2013     Lab Results  Component Value Date   TSH 1.08 12/23/2013       Assessment & Plan:

## 2013-12-23 NOTE — Assessment & Plan Note (Signed)
Labs reviewed, believe this is most explained by poorly controlled asthma > rec optimize rx then regroup     Each maintenance medication was reviewed in detail including most importantly the difference between maintenance and as needed and under what circumstances the prns are to be used.  Please see instructions for details which were reviewed in writing and the patient given a copy.

## 2013-12-23 NOTE — Progress Notes (Signed)
Quick Note:  Spoke with pt and notified of results per Dr. Wert. Pt verbalized understanding and denied any questions.  ______ 

## 2013-12-23 NOTE — Assessment & Plan Note (Signed)
DDX of  difficult airways management all start with A and  include Adherence, Ace Inhibitors, Acid Reflux, Active Sinus Disease, Alpha 1 Antitripsin deficiency, Anxiety masquerading as Airways dz,  ABPA,  allergy(esp in young), Aspiration (esp in elderly), Adverse effects of DPI,  Active smokers, plus two Bs  = Bronchiectasis and Beta blocker use..and one C= CHF  In this case Adherence is the biggest issue and starts with  inability to use HFA effectively and also  understand that SABA treats the symptoms but doesn't get to the underlying problem (inflammation).  I used  the analogy of putting steroid cream on a rash to help explain the meaning of topical therapy and the need to get the drug to the target tissue and she seemed to "get it"  - really very poor insight into how to take her meds - The proper method of use, as well as anticipated side effects, of a metered-dose inhaler are discussed and demonstrated to the patient. Improved effectiveness after extensive coaching during this visit to a level of approximately  75% so continue symbicort 160 2bid for now and gave her sample of 60 puffs, f/u 2 week for trust but verify approach   ? Acid (or non-acid) GERD > always difficult to exclude as up to 75% of pts in some series report no assoc GI/ Heartburn symptoms> rec continue max (24h)  acid suppression and diet restrictions/ reviewed     ? Allergy > suggested by transient consistent responsed to steroids > Prednisone 10 mg take  4 each am x 2 days,   2 each am x 2 days,  1 each am x 2 days and stop   ? Anxiety > dx of exclusion but may be playing a role in her adherence issues.   ? chf > excluded with bnp <<100

## 2013-12-23 NOTE — Patient Instructions (Signed)
Prednisone 10 mg take  4 each am x 2 days,   2 each am x 2 days,  1 each am x 2 days and stop   symbicort 160 Take 2 puffs first thing in am and then another 2 puffs about 12 hours later.   Keep taking protonix 40 mg Take 30-60 min before first meal of the day but add pepcid 20 mg at bedtime until you see Dr Loreta Ave  GERD (REFLUX)  is an extremely common cause of respiratory symptoms, many times with no significant heartburn at all.    It can be treated with medication, but also with lifestyle changes including avoidance of late meals, excessive alcohol, smoking cessation, and avoid fatty foods, chocolate, peppermint, colas, red wine, and acidic juices such as orange juice.  NO MINT OR MENTHOL PRODUCTS SO NO COUGH DROPS  USE SUGARLESS CANDY INSTEAD (jolley ranchers or Stover's)  NO OIL BASED VITAMINS - use powdered substitutes.    For cough > take tramadol 50 mg one every 4 hour as needed   For breathing > albuterol per neb up to every 4 hours as needed   Please remember to go to the lab and x-ray department downstairs for your tests - we will call you with the results when they are available.     Please schedule a follow up office visit in 2 weeks, sooner if needed to see Tammy NP

## 2014-01-09 ENCOUNTER — Ambulatory Visit: Payer: Medicare Other | Admitting: Pulmonary Disease

## 2014-01-10 ENCOUNTER — Ambulatory Visit (INDEPENDENT_AMBULATORY_CARE_PROVIDER_SITE_OTHER): Payer: Medicare Other | Admitting: Pulmonary Disease

## 2014-01-10 ENCOUNTER — Encounter: Payer: Self-pay | Admitting: Pulmonary Disease

## 2014-01-10 VITALS — BP 110/62 | HR 80 | Temp 98.2°F | Ht 61.0 in | Wt 188.4 lb

## 2014-01-10 DIAGNOSIS — J45909 Unspecified asthma, uncomplicated: Secondary | ICD-10-CM

## 2014-01-10 DIAGNOSIS — K219 Gastro-esophageal reflux disease without esophagitis: Secondary | ICD-10-CM

## 2014-01-10 DIAGNOSIS — G4730 Sleep apnea, unspecified: Secondary | ICD-10-CM

## 2014-01-10 MED ORDER — ALBUTEROL SULFATE HFA 108 (90 BASE) MCG/ACT IN AERS: 2.0000 | INHALATION_SPRAY | Freq: Four times a day (QID) | RESPIRATORY_TRACT | Status: DC | PRN

## 2014-01-10 MED ORDER — MONTELUKAST SODIUM 10 MG PO TABS: 10.0000 mg | ORAL_TABLET | Freq: Every day | ORAL | Status: DC

## 2014-01-10 MED ORDER — PREDNISONE 10 MG PO TABS: ORAL_TABLET | ORAL | Status: DC

## 2014-01-10 NOTE — Patient Instructions (Signed)
Key is to keep reflux under control Stay on protonix daytime & pepcid at night Keep appt with Dr Loreta Ave Prednisone 10 mg tabs  Take 2 tabs daily with food x 7ds, then 1 tab daily with food x 7ds then STOP  # 40 Stay on symbicort Albuterol MDI 2 puffs upto 4 times daily as needed for wheezing Start singulair 10 mg at bedtime

## 2014-01-10 NOTE — Assessment & Plan Note (Signed)
Does not want to pursue CPAP

## 2014-01-10 NOTE — Assessment & Plan Note (Signed)
She is again relapsed, and a few days of prednisone. I do believe she needs an extended taper. I still favor GERD is the likely trigger, will need to step up therapy this time. Prednisone 10 mg tabs  Take 2 tabs daily with food x 7ds, then 1 tab daily with food x 7ds then STOP  # 40 Stay on symbicort Albuterol MDI 2 puffs upto 4 times daily as needed for wheezing Start singulair 10 mg at bedtime

## 2014-01-10 NOTE — Progress Notes (Signed)
   Subjective:    Patient ID: [name] [name], female    DOB: 10/05/1935, 78 y.o.   MRN: [id number]  HPI  78F never smoker with CAD for FU of reactive airway disease - trigger ? GERD & mild AS   Significant tests/ events  10/29/09 - initial  OV-  ER visit during a trip to [place] - echo nml LV fn, BNP 55, stress myoview nml CXR 08/10/09 shows bibasal interstitial prominence, lt volume loss with raised hemidiaphragm  CT angio chest 01/2008 was neg for PE, stable RUL 5mm nodule as compared to 05/2005, no obvious fibrosis.  CT sinuses 01/2010 nml  EGD by Dr Mann in 11/10  For GERD PFTs in 10/10 were essentially nml, no obstruction, nml lung volumes & diffusion.  Spirometry 6/11 shows nml lung function, RAST neg, Labs showed neg ANA, and low ESR. RA factor was elevated.  PSG at [facility] 6-9 2011 - REM AHI of 43, overall AHI of 24 Sep, 2011 >> Referred to Rheum ([place]) - favors OA rather than RA, CCP neg, CK was high 709 - muscle cramps, crestor stopped ,EMG done   01/10/2014  Chief Complaint  Patient presents with  . Follow-up    Pt reports breathing is fair. Felt better when she was on prednisone. Denies any wheezing/chest tx. c/o prd cough-clear phlem   It seems like she is always completely better with prednisone then gradually worse off on saba only which helps less and less as time off prednisone increases despite stating she maintains gerd rx - >> added symbicort  3-4 flares this year, last acute office visit was 12/23/2013 with MW- given short prednisone taper which made her feel better, but now she is wheezing again after being off prednisone for a few days. No obvious trigger, she wonders about whether changes.    Review of Systems neg for any significant sore throat, dysphagia, itching, sneezing, nasal congestion or excess/ purulent secretions, fever, chills, sweats, unintended wt loss, pleuritic or exertional cp, hempoptysis, orthopnea pnd or change in chronic leg  swelling. Also denies presyncope, palpitations, heartburn, abdominal pain, nausea, vomiting, diarrhea or change in bowel or urinary habits, dysuria,hematuria, rash, arthralgias, visual complaints, headache, numbness weakness or ataxia.     Objective:   Physical Exam  Gen. Pleasant, well-nourished, in no distress, normal affect ENT - no lesions, no post nasal drip Neck: No JVD, no thyromegaly, no carotid bruits Lungs: no use of accessory muscles, no dullness to percussion, no rales, bilateral scattered rhonchi  Cardiovascular: Rhythm regular, heart sounds  normal, no murmurs or gallops, no peripheral edema Abdomen: soft and non-tender, no hepatosplenomegaly, BS normal. Musculoskeletal: No deformities, no cyanosis or clubbing Neuro:  alert, non focal      Assessment & Plan:   

## 2014-01-10 NOTE — Assessment & Plan Note (Signed)
Key is to keep reflux under control Stay on protonix daytime & pepcid at night Keep appt with Dr Loreta Ave

## 2014-02-12 ENCOUNTER — Telehealth: Payer: Self-pay | Admitting: Neurology

## 2014-02-12 NOTE — Telephone Encounter (Signed)
I called and left a message for the patient about her appointment on 02/14/14. I informed the patient that there was an error made in scheduling her appointment for Friday, she was scheduled for Dr. Frances Furbish instead of Dr. Vickey Huger. I informed the patient that Dr. Vickey Huger has opening on Monday 02/17/14 and to call to schedule.

## 2014-02-14 ENCOUNTER — Ambulatory Visit: Payer: Medicare Other | Admitting: Neurology

## 2014-02-17 ENCOUNTER — Encounter: Payer: Self-pay | Admitting: Adult Health

## 2014-02-17 ENCOUNTER — Institutional Professional Consult (permissible substitution): Payer: Medicare Other | Admitting: Neurology

## 2014-02-17 ENCOUNTER — Encounter: Payer: Self-pay | Admitting: Neurology

## 2014-02-17 ENCOUNTER — Ambulatory Visit (INDEPENDENT_AMBULATORY_CARE_PROVIDER_SITE_OTHER): Payer: Medicare Other | Admitting: Adult Health

## 2014-02-17 VITALS — Ht 61.0 in | Wt 187.0 lb

## 2014-02-17 DIAGNOSIS — Z9989 Dependence on other enabling machines and devices: Principal | ICD-10-CM

## 2014-02-17 DIAGNOSIS — G4733 Obstructive sleep apnea (adult) (pediatric): Secondary | ICD-10-CM

## 2014-02-17 NOTE — Progress Notes (Signed)
PATIENT: [name] [name] DOB: 1936/01/30  REASON FOR VISIT: follow up HISTORY FROM: patient  HISTORY OF PRESENT ILLNESS: Ms. heming is a 78 year old female with history of obstructive sleep Apnea.. She returns today for a 90 day compliance download. She brought her machine with her today and the reports shows an AHI of 4.9 at  9 cm of water, uses her machine for 3:14  hours a night, with _50%compliance. She only used the machine for 3 days. Her Epworth score is 22 points was previously 7 points. Her fatigue severity score is 43 was previously 9.  She reports that she has not been using the CPAP like she should because the nose piece bothers her. However Dr. Philip Aspen told her she needs to use it every night therefore she started last week trying to use it again. She goes to bed around 8:00 pm and arises at 4:00 am. She denies having trouble falling asleep or staying a sleep. States that the she gets up about 3 times a night to urinate. Overall patient feels that CPAP has improved her sleepiness and fatigue. She does states that she has bronchitis due to her asthma.  Since the last visit the patient has had no new medical issues  HISTORY 11/12/12 (CD): is a 78 y.o. female here as a referral from [place], for a sleep consult. This patient had been seen over 3 years ago and had been diagnosed with sleep apnea , but did not follow up with CPAP .  And 2011 the patient endorsed the Epworth sleepiness score at 7 and the Beck Depression Inventory at 4 points at the time she had a body mass index of 34.6 adnexa conference of 15 inches and a blood pressure of 142/85 mm.  The patient had an apnea hypotony index of 23.8 and an oxygen nadir of 81% but only for very brief period of time. The apnea was noted to be accentuated during REM sleep. She also had bradytachycardia heart rate varied between 44 beats a minute to 74 beats per minute. Based on this the patient was asked to return for CPAP titration  11/12/2009.  The patient reports today that she still thinks she sleeps actually rather well, she does have frequent or recurrent bouts of bronchitis and has some PRN medication for its treatment.  The patient reports that her bedtime is around 10:30 and 11 PM that she falls asleep quickly. She has a 5 to 6 Times nocturia at night, beginning just a couple of months ago. She states normally she doesn't hear her husband going to the bathroom while getting up , she has a deep sleep. Reports that she snores since he is quickly asleep too he has not been witnessing any apneas or could not report such.  She rises at 5:30 AM and she awakens spontaneously does not meet the alarm. She will to the gym in the morning 3 times a week Monday ,Tuesday, Wednesday.  Couple of times she had heartburn or some shortness of breath doing workout, but that resolved quickly and has not been the case again for at least 4weeks now.  He has no history of shift work  She has a daughter with a OSA related sleep disorder, her sister uses an oxygen concentrator for COPD.  She also states that for a while and her past she was insomnic and treated with Ambien ,which was not becoming. He discontinued that medication and does not need any sleep aids she insists.  The patient endorsed the Epworth sleepiness score of only 7 points, FSS at 9 points, GDS at 2 points, fall risk is 6 points, 5 for meds and 1 for age . No falls recently .  The patient does not consider asleep poor of poor quality, she denies any excessive daytime sleepiness and does given that the results of a new sleep study may be similar to the one from 3 years ago it is questionable air she would be compliant with treatment. She has neither heart disease nor strokes, she reports. When I asked her about dr Claris Gladden evaluation for advanced CAD , she relented but she was unable to correlate CAD to "heart disease".   REVIEW OF SYSTEMS: Full 14 system review of systems performed  and notable only for:  Constitutional: N/A  Eyes: eye itching. Eye pain, blurred vision Ear/Nose/Throat: N/A  Skin: N/A  Cardiovascular: N/A  Respiratory: cough, wheezing, shortness of breath Gastrointestinal: N/A  Genitourinary: frequency of urination Hematology/Lymphatic: N/A  Endocrine: N/A Musculoskeletal: back pain, muscle cramps Allergy/Immunology: N/A  Neurological: N/A Psychiatric: N/A Sleep: N/A   ALLERGIES: Allergies  Allergen Reactions  . Ace Inhibitors   . Clarithromycin   . Levaquin [Levofloxacin In D5w]     Rash, HA  . Penicillins   . Pravastatin     REACTION: itching    HOME MEDICATIONS: Outpatient Prescriptions Prior to Visit  Medication Sig Dispense Refill  . albuterol (PROVENTIL HFA;VENTOLIN HFA) 108 (90 BASE) MCG/ACT inhaler Inhale 2 puffs into the lungs 4 (four) times daily as needed for wheezing or shortness of breath.  1 Inhaler  2  . albuterol (PROVENTIL) (2.5 MG/3ML) 0.083% nebulizer solution Take 3 mLs (2.5 mg total) by nebulization every 6 (six) hours as needed.  360 mL  6  . budesonide-formoterol (SYMBICORT) 160-4.5 MCG/ACT inhaler Inhale 2 puffs into the lungs 2 (two) times daily.       . clopidogrel (PLAVIX) 75 MG tablet TAKE 1 TABLET (75 MG TOTAL) BY MOUTH DAILY.  90 tablet  3  . Coenzyme Q10 (COQ10 PO) Take by mouth daily.      . CRESTOR 20 MG tablet TAKE 1 TABLET (20 MG TOTAL) BY MOUTH DAILY.  90 tablet  3  . famotidine (PEPCID) 20 MG tablet One at bedtime  30 tablet  2  . LINZESS 290 MCG CAPS capsule Take by mouth as needed.      Marland Kitchen losartan (COZAAR) 100 MG tablet TAKE 1 TABLET (100 MG TOTAL) BY MOUTH DAILY.  90 tablet  3  . montelukast (SINGULAIR) 10 MG tablet Take 1 tablet (10 mg total) by mouth at bedtime.  30 tablet  0  . nitroGLYCERIN (NITROSTAT) 0.4 MG SL tablet Place 0.4 mg under the tongue every 5 (five) minutes as needed.        . pantoprazole (PROTONIX) 40 MG tablet TAKE 1 TABLET BY MOUTH DAILY AS NEEDED.  30 tablet  0  .  predniSONE (DELTASONE) 10 MG tablet Take 2 tabs daily w/ food x 7 days, 1 tab daily x 7 days then stop  40 tablet  0  . traMADol (ULTRAM) 50 MG tablet Take 1 tablet (50 mg total) by mouth 2 (two) times daily as needed (for cough or pain).  40 tablet  0   No facility-administered medications prior to visit.    PAST MEDICAL HISTORY: Past Medical History  Diagnosis Date  . CAD (coronary artery disease)     L heart cath done 9/09 - exertional chest pain.  EF 60%, RCA totally occluded w/L-to-R collateral, distal main had a 20% lesion. small ramus with a 60-70% stenosis, mid LAD had serial 60% stenosis with a distal subtotalled LAD. 50% 1st diagonal and 50% 1st OM stenosis. poor distal targets for bypass surgery/no good interventional target - medical managment planned. Myoview in [place] in 3/11 -nml  . HTN (hypertension)   . PUD (peptic ulcer disease)   . HBV (hepatitis B virus) infection   . HCV (hepatitis C virus)   . Chronic cough     hx; ACEI stoped, possible contribution from GERD  . PVC (premature ventricular contraction)     hx  . PAC (premature atrial contraction)     hx  . GERD (gastroesophageal reflux disease)   . IBS (irritable bowel syndrome)   . Depression   . HLD (hyperlipidemia)     unable to tolaerate crestor due to myalgias and elevated CK. thinks pravastatin casued a rash.   . Obesity   . Pancreatitis     hx  . Osteoporosis   . Osteoarthritis     knee; s/p TKR  . Dyspnea     EF 55-60%, no regional wall motion abnormalities, aortic sclerosis. echo (3/11) in [place]: EF 79%, grade 1 diastolic dysfunction, normal RV, no regional wall motion abntml. valves ok. PFTs (10/10): FVC 103%, ratio 74%. mild obstruction. CT chest (6/11) no evidence for interstitial lung dz. possible asthma/upper airway cough sydrome. aspirin & beta blockers- potential causes for bronchospasm.   . Arthritis   . Sleep apnea     diagnosed in 2011 , did not want CPAP -     PAST SURGICAL HISTORY: Past  Surgical History  Procedure Laterality Date  . Cholecystectomy    . Hyesterectomy, unspec. area  1998  . Pancreatic duct stent    . Tubal ligation  1995   . Cardiac catheterization  2009  . Calcification of right breast-lumps Right 1998    FAMILY HISTORY: Family History  Problem Relation Age of Onset  . Asthma Mother   . Arthritis Mother     rheumatiod arthritis  . Stroke Mother   . Asthma Sister   . Stroke Sister   . COPD Sister   . Emphysema Sister   . Colon cancer Maternal Aunt   . Lung cancer Maternal Aunt   . Pancreatic cancer Brother     SOCIAL HISTORY: History   Social History  . Marital Status: Married    Spouse Name: N/A    Number of Children: 4  . Years of Education: 12   Occupational History  . retired    Social History Main Topics  . Smoking status: Never Smoker   . Smokeless tobacco: Never Used     Comment: 2nd hand exposure x40 years   . Alcohol Use: No  . Drug Use: No  . Sexual Activity: Not Currently   Other Topics Concern  . Not on file   Social History Narrative   Married, lives with husband; has children.   Retired Materials engineer.       Cell # [phone]; okay to leave a message.             PHYSICAL EXAM  Filed Vitals:   02/17/14 1444  Height: _0  (1.549 m)  Weight: 187 lb (84.823 kg)   Body mass index is 35.35 kg/(m^2).  Generalized: Well developed, in no acute distress  Neck: Circumference 16 inches, Mallampati 4+  Neurological examination  Mentation: Alert oriented to time,  place, history taking. Follows all commands speech and language fluent Cranial nerve II-XII: Pupils were equal round reactive to light. Extraocular movements were full, visual field were full on confrontational test. Facial sensation and strength were normal.  Uvula tongue midline. Head turning and shoulder shrug  were normal and symmetric. Motor: The motor testing reveals 5 over 5 strength of all 4 extremities. Good symmetric motor tone is  noted throughout.  Sensory: Sensory testing is intact to soft touch on all 4 extremities. No evidence of extinction is noted.  Coordination: Cerebellar testing reveals good finger-nose-finger and heel-to-shin bilaterally.  Gait and station: Gait is normal.   Reflexes: Deep tendon reflexes are symmetric and normal bilaterally.    DIAGNOSTIC DATA (LABS, IMAGING, TESTING) - I reviewed patient records, labs, notes, testing and imaging myself where available.  Lab Results  Component Value Date   WBC 6.5 12/23/2013   HGB 12.8 12/23/2013   HCT 37.6 12/23/2013   MCV 92.3 12/23/2013   PLT 175.0 12/23/2013      Component Value Date/Time   NA 143 12/23/2013 1304   K 3.7 12/23/2013 1304   CL 111 12/23/2013 1304   CO2 26 12/23/2013 1304   GLUCOSE 87 12/23/2013 1304   BUN 13 12/23/2013 1304   CREATININE 0.9 12/23/2013 1304   CALCIUM 9.0 12/23/2013 1304   PROT 7.0 08/29/2011 1028   ALBUMIN 3.7 08/29/2011 1028   AST 26 08/29/2011 1028   ALT 24 08/29/2011 1028   ALKPHOS 82 08/29/2011 1028   BILITOT 0.4 08/29/2011 1028   GFRNONAA >60 06/29/2010 0519   GFRAA  Value: >60        The eGFR has been calculated using the MDRD equation. This calculation has not been validated in all clinical situations. eGFR's persistently <60 mL/min signify possible Chronic Kidney Disease. 06/29/2010 0519   Lab Results  Component Value Date   CHOL 119 06/11/2013   HDL 40.70 06/11/2013   LDLCALC 57 06/11/2013   TRIG 109.0 06/11/2013   CHOLHDL 3 06/11/2013    Lab Results  Component Value Date   TSH 1.08 12/23/2013      ASSESSMENT AND PLAN 78 y.o. year old female  has a past medical history of CAD (coronary artery disease); HTN (hypertension); PUD (peptic ulcer disease); HBV (hepatitis B virus) infection; HCV (hepatitis C virus); Chronic cough; PVC (premature ventricular contraction); PAC (premature atrial contraction); GERD (gastroesophageal reflux disease); IBS (irritable bowel syndrome); Depression; HLD (hyperlipidemia); Obesity;  Pancreatitis; Osteoporosis; Osteoarthritis; Dyspnea; Arthritis; and Sleep apnea. here with:  1. OSA on CPAP  Patient has not been using her CPAP because she does not like the nose piece. She has agreed to start using the machine nightly, if the nose piece continues to bother her we will have her fitted for a new mask. Patient verbalized understanding and states that she will call [place] if the mask needs to be adjusted.   Ward Givens, MSN, NP-C 02/17/2014, 2:47 PM [facility] [address] [place], [place] [address] [phone]  Note: This document was prepared with digital dictation and possible smart phrase technology. Any transcriptional errors that result from this process are unintentional.

## 2014-02-18 NOTE — Progress Notes (Signed)
I agree with the assessment and plan as directed by NP .The patient is known to me .   Lailah Marcelli, MD  

## 2014-03-10 ENCOUNTER — Other Ambulatory Visit: Payer: Self-pay | Admitting: Cardiology

## 2014-03-18 ENCOUNTER — Other Ambulatory Visit: Payer: Self-pay | Admitting: Internal Medicine

## 2014-03-18 MED ORDER — PREDNISONE 10 MG PO TABS: ORAL_TABLET | ORAL | Status: DC

## 2014-03-31 ENCOUNTER — Ambulatory Visit (INDEPENDENT_AMBULATORY_CARE_PROVIDER_SITE_OTHER): Payer: Medicare Other | Admitting: Internal Medicine

## 2014-03-31 ENCOUNTER — Encounter (INDEPENDENT_AMBULATORY_CARE_PROVIDER_SITE_OTHER): Payer: Self-pay

## 2014-03-31 ENCOUNTER — Encounter: Payer: Self-pay | Admitting: Internal Medicine

## 2014-03-31 VITALS — BP 140/86 | HR 79 | Ht 61.0 in | Wt 187.0 lb

## 2014-03-31 DIAGNOSIS — I10 Essential (primary) hypertension: Secondary | ICD-10-CM

## 2014-03-31 DIAGNOSIS — J4520 Mild intermittent asthma, uncomplicated: Secondary | ICD-10-CM

## 2014-03-31 MED ORDER — VALSARTAN 160 MG PO TABS: 160.0000 mg | ORAL_TABLET | Freq: Every day | ORAL | Status: DC

## 2014-03-31 NOTE — Progress Notes (Signed)
Subjective:    Patient ID: [name] [name], female    DOB: 02/02/1936, 78 y.o.   MRN: [id number]    Brief patient profile:  89 yobf  never smoker with CAD for FU of reactive airway disease - trigger ? GERD & mild AS   10/29/09 - initial  This was of insidious onset - - She had an ER visit during a trip to [place] - echo nml LV fn, BNP 55, stress myoview nml, given cipro for 'bronchitis.'  CXR 08/10/09 shows bibasal interstitial prominence, lt volume loss with raised hemidiaphragm  CT angio chest 01/2008 was neg for PE, stable RUL 39mm nodule as compared to 05/2005, no obvious fibrosis. CT sinuses 8/11 nml  She reports intermittent heartburn with protonix , had an EGD by Dr Collene Mares in 11/10  PFTs in 10/10 were essentially nml, no obstruction, nml lung volumes & diffusion.  She had a sleep study at [facility] neurology. Spirometry 6/11 shows nml lung function, RAST neg, Labs showed neg ANA, and low ESR. RA factor was elevated.  ER visit on 01/09/10 - absolute eos count 800 ,BNP 69, CXR - cardiomegaly, was on prednisone .  Sep, 2011 >> Referred to [facility]) - favors OA rather than RA, CCP neg, CK was high 709 - muscle cramps, crestor stopped Stopped advair x 2 weeks - 'ran out', beta blockers stopped       11/04/2013 ov  Chief Complaint  Patient presents with  . Follow-up    Pt states she is still wheezing. Pt finished zpak and recieved a shot, pt unsure of injection, at family doctor 2 weeks ago. Pt c/o increase SOB and productive cough with thick white mucous and c/o intermittent midsternal CP .  She developed fever and cough 2 weeks ago, took a Z-Pak and a shot of? Solu-Medrol. Says she takes Protonix when necessary rec Prednisone 10 mg tabs  Take 2 tabs daily with food x 5ds, then 1 tab daily with food x 5ds then STOP -   Stay on symbicort 2 puffs twice daily Use albuterol as needed only for wheezing Take protonix daily x 6 weeks -make appt with Dr Collene Mares for reflux   12/23/2013 acute  extended ov/Rubbie Goostree re: ? asthma exac  Chief Complaint  Patient presents with  . Acute Visit    Pt c/o increased cough, DOE and fatigue x 3 wks. Cough is prod with minimal clear sputum.    better while on prednisone, but husband took her pill  symbicort only took one pff daily "I thought that's what 2 twice daily meant" protonix 40 mg 30-60 min breakfast Sob varies from at rest to just with exertion depending how recently she took pred as does saba dependency  rec Prednisone 10 mg take  4 each am x 2 days,   2 each am x 2 days,  1 each am x 2 days and stop  Symbicort 160 Take 2 puffs first thing in am and then another 2 puffs about 12 hours later.  Keep taking protonix 40 mg Take 30-60 min before first meal of the day but add pepcid 20 mg at bedtime until you see Dr Collene Mares GERD   For cough > take tramadol 50 mg one every 4 hour as needed  For breathing > albuterol per neb up to every 4 hours as needed    01/10/14 [place] is to keep reflux under control Stay on protonix daytime & pepcid at night Keep appt with Dr Collene Mares Prednisone  10 mg tabs  Take 2 tabs daily with food x 7ds, then 1 tab daily with food x 7ds then STOP  # 40 Stay on symbicort Albuterol MDI 2 puffs upto 4 times daily as needed for wheezing Start singulair 10 mg at bedtime> never started it    03/31/2014 f/u ov/Joeanna Howdyshell re: ? Asthma  Chief Complaint  Patient presents with  . Follow-up    Pt states that her breathing is doing better since the last visit. She states does not have rescue inhaler, and has neb but has not needed since last visit.    maintaining protonix 40 mg ac and pepcid at bedtime but easily confused with names of meds and instructions  No obvious patterns in day to day or daytime variabilty or assoc cough or  cp or chest tightness, subjective wheeze overt sinus or hb symptoms. No unusual exp hx or h/o childhood pna/ asthma or knowledge of premature birth.  Sleeping ok without nocturnal  or early am exacerbation   of respiratory  c/o's or need for noct saba. Also denies any obvious fluctuation of symptoms with weather or environmental changes or other aggravating or alleviating factors except as outlined above   Current Medications, Allergies, Complete Past Medical History, Past Surgical History, Family History, and Social History were reviewed in [employer] record.  ROS  The following are not active complaints unless bolded sore throat, dysphagia, dental problems, itching, sneezing,  nasal congestion or excess/ purulent secretions, ear ache,   fever, chills, sweats, unintended wt loss, pleuritic or exertional cp, hemoptysis,  orthopnea pnd or leg swelling, presyncope, palpitations, heartburn, abdominal pain, anorexia, nausea, vomiting, diarrhea  or change in bowel or urinary habits, change in stools or urine, dysuria,hematuria,  rash, arthralgias, visual complaints, headache, numbness weakness or ataxia or problems with walking or coordination,  change in mood/affect or memory.         07/24/2013 Spirometry no airway obsn, FEV1 97% -1.53  Rpt echo  - mild AS         Objective:  Physical Exam  Wt Readings from Last 3 Encounters:  03/31/14 187 lb (84.823 kg)  02/17/14 187 lb (84.823 kg)  01/10/14 188 lb 6.4 oz (85.458 kg)      amb bf nad       HEENT: nl dentition, turbinates, and orophanx. Nl external ear canals without cough reflex   NECK :  without JVD/Nodes/TM/ nl carotid upstrokes bilaterally   LUNGS: no acc muscle use, clear to A and P bilaterally without cough on insp or exp maneuvers   CV:  RRR  no s3,  II/IV SEM  no increase in P2, no edema   ABD:  soft and nontender with nl excursion in the supine position. No bruits or organomegaly, bowel sounds nl  MS:  warm without deformities, calf tenderness, cyanosis or clubbing  SKIN: warm and dry without lesions    NEURO:  alert, approp, no deficits    CXR  12/23/2013 :  Stable mild to moderate cardiac  enlargement. Stable uncoiling of the aorta. Vascular pattern is normal. Lungs are clear. No pleural effusions.   Recent Labs Lab 12/23/13 1304  NA 143  K 3.7  CL 111  CO2 26  BUN 13  CREATININE 0.9  GLUCOSE 87    Recent Labs Lab 12/23/13 1304  HGB 12.8  HCT 37.6  WBC 6.5  PLT 175.0      Lab Results  Component Value Date   PROBNP 43.0  12/23/2013     Lab Results  Component Value Date   TSH 1.08 12/23/2013       Assessment & Plan:

## 2014-03-31 NOTE — Patient Instructions (Addendum)
Continue symbicort 160 Take 2 puffs first thing in am and then another 2 puffs about 12 hours later.   Work on inhaler technique:  relax and gently blow all the way out then take a nice smooth deep breath back in, triggering the inhaler at same time you start breathing in.  Hold for up to 5 seconds if you can.  Rinse and gargle with water when done  Only use your albuterol as a rescue medication to be used if you can't catch your breath by resting or doing a relaxed purse lip breathing pattern.  - The less you use it, the better it will work when you need it. - Ok to use up to   every 4 hours  Stop cozar and take diovan (valsartan) 160 mg one daily   Please schedule a follow up office visit in 4 weeks, sooner if needed

## 2014-04-01 ENCOUNTER — Telehealth: Payer: Self-pay | Admitting: Cardiology

## 2014-04-01 ENCOUNTER — Other Ambulatory Visit: Payer: Self-pay

## 2014-04-01 DIAGNOSIS — I10 Essential (primary) hypertension: Secondary | ICD-10-CM

## 2014-04-01 NOTE — Telephone Encounter (Signed)
New message      Can pt take valsartan 160mg ?  She has bronchitis and asthma.  Her lung doctor prescribed this.  Also, when is pt due to see Dr again?

## 2014-04-01 NOTE — Assessment & Plan Note (Signed)
Unclear to what extent her symptoms are all explained by asthma as she is not able to use symbicort effectively and yet reports it works when she uses it.  Therefore Symptoms are markedly disproportionate to objective findings and not clear this is a lung problem but pt does appear to have difficult airway management issues. DDX of  difficult airways management all start with A and  include Adherence, Ace Inhibitors, Acid Reflux, Active Sinus Disease, Alpha 1 Antitripsin deficiency, Anxiety masquerading as Airways dz,  ABPA,  allergy(esp in young), Aspiration (esp in elderly), Adverse effects of DPI,  Active smokers, plus two Bs  = Bronchiectasis and Beta blocker use..and one C= CHF  Adherence is always the initial "prime suspect" and is a multilayered concern that requires a "trust but verify" approach in every patient - starting with knowing how to use medications, especially inhalers, correctly, keeping up with refills and understanding the fundamental difference between maintenance and prns vs those medications only taken for a very short course and then stopped and not refilled.  - The proper method of use, as well as anticipated side effects, of a metered-dose inhaler are discussed and demonstrated to the patient. Improved effectiveness after extensive coaching during this visit to a level of approximately  75% > continue symbiocrt for now, given samples x 4 weeks - declines ov for med reconciliation though I strongly rec she do this  ? Acid (or non-acid) GERD > always difficult to exclude as up to 75% of pts in some series report no assoc GI/ Heartburn symptoms> rec continue max (24h)  acid suppression and diet restrictions/ reviewed     ? Anxiety contributing to symptoms  ? Anxiety > not taking singulair as rec  ? ACE or ARB effect.  For reasons that may related to vascular permability and nitric oxide pathways but not elevated  bradykinin levels (as seen with  ACEi use) losartan in the generic  form has been reported now from mulitple sources  to cause a similar pattern of non-specific  upper airway symptoms as seen with acei.   This has not been reported with exposure to the other ARB's to date, so it seems reasonable for now to try either generic avapro if ARB needed or use an alternative class altogether.  See:  Dewayne Hatch Allergy Asthma Immunol  2008: 101: p [phone]

## 2014-04-01 NOTE — Assessment & Plan Note (Signed)
Changed to diovan in place of losartan 04/01/2014 due to concerns with pseudoasthma

## 2014-04-01 NOTE — Telephone Encounter (Signed)
Informed patient that Dr. Shirlee Latch and his nurse are not in the office today. Explained that I would make sure these questions are sent to them to address. She verbalized understanding and appreciative of keeping her informed.  (Pt saw Shirlee Latch in January, followed up with Lawson Fiscal, [place] in February. Per Lori's note patient was to follow up with Shirlee Latch in 4 months, but no recall/appt made)

## 2014-04-01 NOTE — Telephone Encounter (Signed)
Ok to take valsartan instead of losartan.  Needs BMET 2 wks.  If she's feeling ok, followup next available with me.

## 2014-04-01 NOTE — Telephone Encounter (Signed)
Informed patient that per Dr. Shirlee Latch, it is OK to take Valsartan instead of Losartan.  Made appointment for BMET 11/10.   Informed patient message would be sent to Dr. Alford Highland nurse to F/U for office visit as patient is unable to plan today.

## 2014-04-02 NOTE — Telephone Encounter (Signed)
I will ask scheduler to call patient to arrange follow up appt

## 2014-04-07 ENCOUNTER — Telehealth: Payer: Self-pay | Admitting: Cardiology

## 2014-04-07 NOTE — Telephone Encounter (Signed)
Spoke with patient about changing from losartan to valsartan and that Dr Shirlee Latch had OK'd this change.

## 2014-04-07 NOTE — Telephone Encounter (Signed)
New message    Patient calling need to discuss medication and other questions

## 2014-04-15 ENCOUNTER — Other Ambulatory Visit (INDEPENDENT_AMBULATORY_CARE_PROVIDER_SITE_OTHER): Payer: Medicare Other

## 2014-04-15 DIAGNOSIS — I10 Essential (primary) hypertension: Secondary | ICD-10-CM

## 2014-04-15 LAB — BASIC METABOLIC PANEL
BUN: 17 mg/dL (ref 6–23)
CO2: 25 mEq/L (ref 19–32)
Calcium: 8.8 mg/dL (ref 8.4–10.5)
Chloride: 110 mEq/L (ref 96–112)
Creatinine, Ser: 1 mg/dL (ref 0.4–1.2)
GFR: 72.57 mL/min (ref >60.00)
Glucose, Bld: 94 mg/dL (ref 70–99)
Potassium: 4.1 mEq/L (ref 3.5–5.1)
Sodium: 143 mEq/L (ref 135–145)

## 2014-04-20 ENCOUNTER — Emergency Department (HOSPITAL_COMMUNITY): Payer: Medicare Other

## 2014-04-20 ENCOUNTER — Emergency Department (HOSPITAL_COMMUNITY)
Admission: EM | Admit: 2014-04-20 | Discharge: 2014-04-20 | Disposition: A | Discharge status: Home / Self Care | Payer: Medicare Other | Attending: Emergency Medicine | Admitting: Emergency Medicine

## 2014-04-20 ENCOUNTER — Encounter (HOSPITAL_COMMUNITY): Payer: Self-pay | Admitting: Emergency Medicine

## 2014-04-20 DIAGNOSIS — F329 Major depressive disorder, single episode, unspecified: Secondary | ICD-10-CM | POA: Diagnosis not present

## 2014-04-20 DIAGNOSIS — K219 Gastro-esophageal reflux disease without esophagitis: Secondary | ICD-10-CM | POA: Insufficient documentation

## 2014-04-20 DIAGNOSIS — R519 Headache, unspecified: Secondary | ICD-10-CM

## 2014-04-20 DIAGNOSIS — R51 Headache: Secondary | ICD-10-CM | POA: Diagnosis present

## 2014-04-20 DIAGNOSIS — Z7902 Long term (current) use of antithrombotics/antiplatelets: Secondary | ICD-10-CM | POA: Diagnosis not present

## 2014-04-20 DIAGNOSIS — J0120 Acute ethmoidal sinusitis, unspecified: Secondary | ICD-10-CM | POA: Diagnosis not present

## 2014-04-20 DIAGNOSIS — R110 Nausea: Secondary | ICD-10-CM | POA: Diagnosis not present

## 2014-04-20 DIAGNOSIS — R630 Anorexia: Secondary | ICD-10-CM | POA: Diagnosis not present

## 2014-04-20 DIAGNOSIS — Z9889 Other specified postprocedural states: Secondary | ICD-10-CM | POA: Diagnosis not present

## 2014-04-20 DIAGNOSIS — K279 Peptic ulcer, site unspecified, unspecified as acute or chronic, without hemorrhage or perforation: Secondary | ICD-10-CM | POA: Diagnosis not present

## 2014-04-20 DIAGNOSIS — Z79899 Other long term (current) drug therapy: Secondary | ICD-10-CM | POA: Insufficient documentation

## 2014-04-20 DIAGNOSIS — R1013 Epigastric pain: Secondary | ICD-10-CM | POA: Diagnosis not present

## 2014-04-20 DIAGNOSIS — E785 Hyperlipidemia, unspecified: Secondary | ICD-10-CM | POA: Insufficient documentation

## 2014-04-20 DIAGNOSIS — R011 Cardiac murmur, unspecified: Secondary | ICD-10-CM | POA: Diagnosis not present

## 2014-04-20 DIAGNOSIS — R197 Diarrhea, unspecified: Secondary | ICD-10-CM | POA: Diagnosis not present

## 2014-04-20 DIAGNOSIS — I2510 Atherosclerotic heart disease of native coronary artery without angina pectoris: Secondary | ICD-10-CM | POA: Insufficient documentation

## 2014-04-20 DIAGNOSIS — E669 Obesity, unspecified: Secondary | ICD-10-CM | POA: Diagnosis not present

## 2014-04-20 DIAGNOSIS — I10 Essential (primary) hypertension: Secondary | ICD-10-CM | POA: Diagnosis not present

## 2014-04-20 DIAGNOSIS — Z8619 Personal history of other infectious and parasitic diseases: Secondary | ICD-10-CM | POA: Diagnosis not present

## 2014-04-20 DIAGNOSIS — Z880 Allergy status to penicillin: Secondary | ICD-10-CM | POA: Insufficient documentation

## 2014-04-20 DIAGNOSIS — M1990 Unspecified osteoarthritis, unspecified site: Secondary | ICD-10-CM | POA: Insufficient documentation

## 2014-04-20 DIAGNOSIS — Z8669 Personal history of other diseases of the nervous system and sense organs: Secondary | ICD-10-CM | POA: Insufficient documentation

## 2014-04-20 LAB — CBC WITH DIFFERENTIAL/PLATELET
Basophils Absolute: 0.1 10*3/uL (ref 0.0–0.1)
Basophils Relative: 1 % (ref 0–1)
Eosinophils Absolute: 0.2 10*3/uL (ref 0.0–0.7)
Eosinophils Relative: 3 % (ref 0–5)
HCT: 38.8 % (ref 36.0–46.0)
Hemoglobin: 13.2 g/dL (ref 12.0–15.0)
Lymphocytes Relative: 29 % (ref 12–46)
Lymphs Abs: 2.1 10*3/uL (ref 0.7–4.0)
MCH: 30.6 pg (ref 26.0–34.0)
MCHC: 34 g/dL (ref 30.0–36.0)
MCV: 90 fL (ref 78.0–100.0)
Monocytes Absolute: 0.9 10*3/uL (ref 0.1–1.0)
Monocytes Relative: 12 % (ref 3–12)
Neutro Abs: 4 10*3/uL (ref 1.7–7.7)
Neutrophils Relative %: 55 % (ref 43–77)
Platelets: 170 10*3/uL (ref 150–400)
RBC: 4.31 MIL/uL (ref 3.87–5.11)
RDW: 12.7 % (ref 11.5–15.5)
WBC: 7.3 10*3/uL (ref 4.0–10.5)

## 2014-04-20 LAB — URINALYSIS, ROUTINE W REFLEX MICROSCOPIC
Bilirubin Urine: NEGATIVE
Glucose, UA: NEGATIVE mg/dL
Hgb urine dipstick: NEGATIVE
Ketones, ur: NEGATIVE mg/dL
Leukocytes, UA: NEGATIVE
Nitrite: NEGATIVE
Protein, ur: NEGATIVE mg/dL
Specific Gravity, Urine: 1.022 (ref 1.005–1.030)
Urobilinogen, UA: 0.2 mg/dL (ref 0.0–1.0)
pH: 5 (ref 5.0–8.0)

## 2014-04-20 LAB — I-STAT CHEM 8, ED
BUN: 17 mg/dL (ref 6–23)
Calcium, Ion: 1.19 mmol/L (ref 1.13–1.30)
Chloride: 108 mEq/L (ref 96–112)
Creatinine, Ser: 0.9 mg/dL (ref 0.50–1.10)
Glucose, Bld: 109 mg/dL — ABNORMAL HIGH (ref 70–99)
HCT: 41 % (ref 36.0–46.0)
Hemoglobin: 13.9 g/dL (ref 12.0–15.0)
Potassium: 3.7 mEq/L (ref 3.7–5.3)
Sodium: 140 mEq/L (ref 137–147)
TCO2: 20 mmol/L (ref 0–100)

## 2014-04-20 LAB — HEPATIC FUNCTION PANEL
ALT: 18 U/L (ref 0–35)
AST: 20 U/L (ref 0–37)
Albumin: 3.5 g/dL (ref 3.5–5.2)
Alkaline Phosphatase: 70 U/L (ref 39–117)
Bilirubin, Direct: <0.2 mg/dL (ref 0.0–0.3)
Total Bilirubin: 0.4 mg/dL (ref 0.3–1.2)
Total Protein: 7.4 g/dL (ref 6.0–8.3)

## 2014-04-20 LAB — LIPASE, BLOOD: Lipase: 25 U/L (ref 11–59)

## 2014-04-20 LAB — I-STAT TROPONIN, ED: Troponin i, poc: 0.01 ng/mL (ref 0.00–0.08)

## 2014-04-20 MED ORDER — ACETAMINOPHEN 325 MG PO TABS
650.0000 mg | ORAL_TABLET | Freq: Once | ORAL | Status: AC
  Administered 2014-04-20: 650 mg via ORAL
  Filled 2014-04-20: qty 2

## 2014-04-20 MED ORDER — ONDANSETRON 4 MG PO TBDP
4.0000 mg | ORAL_TABLET | Freq: Once | ORAL | Status: AC
  Administered 2014-04-20: 4 mg via ORAL
  Filled 2014-04-20: qty 1

## 2014-04-20 MED ORDER — GI COCKTAIL ~~LOC~~
30.0000 mL | Freq: Once | ORAL | Status: AC
  Administered 2014-04-20: 30 mL via ORAL
  Filled 2014-04-20: qty 30

## 2014-04-20 MED ORDER — DOXYCYCLINE HYCLATE 100 MG PO CAPS: 100.0000 mg | ORAL_CAPSULE | Freq: Two times a day (BID) | ORAL | Status: DC

## 2014-04-20 MED ORDER — MOMETASONE FUROATE 50 MCG/ACT NA SUSP: 2.0000 | Freq: Every day | NASAL | Status: DC

## 2014-04-20 NOTE — ED Provider Notes (Signed)
Medical screening examination/treatment/procedure(s) were conducted as a shared visit with non-physician practitioner(s) and myself.  I personally evaluated the patient during the encounter.   EKG Interpretation   Date/Time:  Sunday April 20 2014 07:24:42 EST Ventricular Rate:  61 PR Interval:  298 QRS Duration: 100 QT Interval:  398 QTC Calculation: 401 R Axis:   -26 Text Interpretation:  Sinus rhythm Prolonged PR interval Abnormal R-wave  progression, early transition Inferior infarct, old Lateral leads are also  involved Baseline wander in lead(s) V6 No significant change since last  tracing Confirmed by Ashante Yellin  MD, Kathren Scearce ([id number]) on 04/20/2014 7:41:32 AM     Patient here complaining of frontal headache watery diarrhea. GI symptoms have been persistent and constant. She is seen by a specialist for this. Headache has no warning signs for subarachnoid hemorrhage. Head CT consistent with sinusitis. Patient be placed in a boxing discharged home. Neurological exam stable  Toy Baker, MD 04/20/14 [phone]

## 2014-04-20 NOTE — ED Provider Notes (Signed)
CSN: [id number]     Arrival date & time 04/20/14  6063 History   First MD Initiated Contact with Patient 04/20/14 0602     Chief Complaint  Patient presents with  . Headache  . Abdominal Pain  . Nausea  . Diarrhea     (Consider location/radiation/quality/duration/timing/severity/associated sxs/prior Treatment) HPI Comments: Patient with history of cholecystectomy, CAD -- presents with complaint of new headache for 5 days with associated watery nonbloody diarrhea and upper abdominal pain which is similar to previous GERD. Patient states that her symptoms started after a medication change, losartan changed to diovan. This was done by Dr. Sherene Sires who treats her bronchospasm. Patient denies fever, vomiting. She has had nausea and decreased appetite. Has not taken any treatments prior to arrival other than her daily medications. Patient states that her headache is in the front of her face and in the front of her head, described as throbbing without photophobia. She has had some nasal congestion. Denies dental issues. Patient does not typically get headaches. No vision change, trouble walking however she states that her balance has been off at times over the past 5 days. She has not fallen. No slurred speech or facial droop.  Patient is a 78 y.o. female presenting with headaches, abdominal pain, and diarrhea. The history is provided by the patient.  Headache Associated symptoms: abdominal pain, congestion, diarrhea and nausea   Associated symptoms: no fever, no neck pain, no neck stiffness, no numbness, no photophobia, no sinus pressure and no vomiting   Abdominal Pain Associated symptoms: diarrhea and nausea   Associated symptoms: no chest pain, no fever, no shortness of breath and no vomiting   Diarrhea Associated symptoms: abdominal pain and headaches   Associated symptoms: no fever and no vomiting     Past Medical History  Diagnosis Date  . CAD (coronary artery disease)     L heart cath  done 9/09 - exertional chest pain. EF 60%, RCA totally occluded w/L-to-R collateral, distal main had a 20% lesion. small ramus with a 60-70% stenosis, mid LAD had serial 60% stenosis with a distal subtotalled LAD. 50% 1st diagonal and 50% 1st OM stenosis. poor distal targets for bypass surgery/no good interventional target - medical managment planned. Myoview in [place] in 3/11 -nml  . HTN (hypertension)   . PUD (peptic ulcer disease)   . HBV (hepatitis B virus) infection   . HCV (hepatitis C virus)   . Chronic cough     hx; ACEI stoped, possible contribution from GERD  . PVC (premature ventricular contraction)     hx  . PAC (premature atrial contraction)     hx  . GERD (gastroesophageal reflux disease)   . IBS (irritable bowel syndrome)   . Depression   . HLD (hyperlipidemia)     unable to tolaerate crestor due to myalgias and elevated CK. thinks pravastatin casued a rash.   . Obesity   . Pancreatitis     hx  . Osteoporosis   . Osteoarthritis     knee; s/p TKR  . Dyspnea     EF 55-60%, no regional wall motion abnormalities, aortic sclerosis. echo (3/11) in [place]: EF 60%, grade 1 diastolic dysfunction, normal RV, no regional wall motion abntml. valves ok. PFTs (10/10): FVC 103%, ratio 74%. mild obstruction. CT chest (6/11) no evidence for interstitial lung dz. possible asthma/upper airway cough sydrome. aspirin & beta blockers- potential causes for bronchospasm.   . Arthritis   . Sleep apnea     diagnosed  in 2011 , did not want CPAP -    Past Surgical History  Procedure Laterality Date  . Cholecystectomy    . Hyesterectomy, unspec. area  1998  . Pancreatic duct stent    . Tubal ligation  1995   . Cardiac catheterization  2009  . Calcification of right breast-lumps Right 1998   Family History  Problem Relation Age of Onset  . Asthma Mother   . Arthritis Mother     rheumatiod arthritis  . Stroke Mother   . Asthma Sister   . Stroke Sister   . COPD Sister   . Emphysema Sister   .  Colon cancer Maternal Aunt   . Lung cancer Maternal Aunt   . Pancreatic cancer Brother    History  Substance Use Topics  . Smoking status: Never Smoker   . Smokeless tobacco: Never Used     Comment: 2nd hand exposure x40 years   . Alcohol Use: No   OB History    No data available     Review of Systems  Constitutional: Positive for appetite change. Negative for fever.  HENT: Positive for congestion. Negative for dental problem, rhinorrhea and sinus pressure.   Eyes: Negative for photophobia, discharge, redness and visual disturbance.  Respiratory: Negative for shortness of breath.   Cardiovascular: Negative for chest pain.  Gastrointestinal: Positive for nausea, abdominal pain and diarrhea. Negative for vomiting.  Musculoskeletal: Negative for gait problem, neck pain and neck stiffness.  Skin: Negative for rash.  Neurological: Positive for headaches. Negative for syncope, speech difficulty, weakness, light-headedness and numbness.  Psychiatric/Behavioral: Negative for confusion.    Allergies  Clarithromycin; Levaquin; Pravastatin; and Penicillins  Home Medications   Prior to Admission medications   Medication Sig Start Date End Date Taking? Authorizing Provider  albuterol (PROVENTIL HFA;VENTOLIN HFA) 108 (90 BASE) MCG/ACT inhaler Inhale 2 puffs into the lungs 4 (four) times daily as needed for wheezing or shortness of breath. 01/10/14  Yes Oretha Milch, MD  budesonide-formoterol (SYMBICORT) 160-4.5 MCG/ACT inhaler Inhale 2 puffs into the lungs 2 (two) times daily as needed (shortness of breath).    Yes Historical Provider, MD  clopidogrel (PLAVIX) 75 MG tablet TAKE 1 TABLET (75 MG TOTAL) BY MOUTH DAILY. 03/11/14  Yes Laurey Morale, MD  Coenzyme Q10 (COQ10 PO) Take 1 tablet by mouth daily.    Yes Historical Provider, MD  CRESTOR 20 MG tablet TAKE 1 TABLET (20 MG TOTAL) BY MOUTH DAILY. 03/11/14  Yes Laurey Morale, MD  famotidine (PEPCID) 20 MG tablet One at bedtime 12/23/13  Yes  Nyoka Cowden, MD  nitroGLYCERIN (NITROSTAT) 0.4 MG SL tablet Place 0.4 mg under the tongue every 5 (five) minutes as needed for chest pain.    Yes Historical Provider, MD  pantoprazole (PROTONIX) 40 MG tablet 1 every am 30 min before first meal   Yes Laurey Morale, MD  Polyethyl Glycol-Propyl Glycol (SYSTANE OP) Place 1 drop into both eyes daily as needed (dry eyes).   Yes Historical Provider, MD  traMADol (ULTRAM) 50 MG tablet Take 1 tablet (50 mg total) by mouth 2 (two) times daily as needed (for cough or pain). 12/23/13  Yes Nyoka Cowden, MD  valsartan (DIOVAN) 160 MG tablet Take 1 tablet (160 mg total) by mouth daily. 03/31/14  Yes Nyoka Cowden, MD  Vitamin D, Ergocalciferol, (DRISDOL) 50000 UNITS CAPS capsule Take 50,000 Units by mouth every 7 (seven) days.  01/24/14  Yes Historical Provider, MD  albuterol (  PROVENTIL) (2.5 MG/3ML) 0.083% nebulizer solution Take 3 mLs (2.5 mg total) by nebulization every 6 (six) hours as needed. Patient not taking: Reported on 04/20/2014 11/14/13   Oretha Milch, MD   BP 145/86 mmHg  Pulse 63  Temp(Src) 98.9 F (37.2 C) (Oral)  Resp 19  Ht 5' (1.524 m)  Wt 177 lb (80.287 kg)  BMI 34.57 kg/m2  SpO2 93%   Physical Exam  Constitutional: She is oriented to person, place, and time. She appears well-developed and well-nourished.  HENT:  Head: Normocephalic and atraumatic.  Right Ear: Tympanic membrane, external ear and ear canal normal.  Left Ear: Tympanic membrane, external ear and ear canal normal.  Nose: Nose normal. No mucosal edema.  Mouth/Throat: Uvula is midline, oropharynx is clear and moist and mucous membranes are normal. No oropharyngeal exudate, posterior oropharyngeal edema or posterior oropharyngeal erythema.  Patient with partials in place. No tooth abscess.   Eyes: Conjunctivae, EOM and lids are normal. Pupils are equal, round, and reactive to light. Right eye exhibits no nystagmus. Left eye exhibits no nystagmus.  Neck: Normal range  of motion. Neck supple.  No meningismus. No carotid bruits.   Cardiovascular: Normal rate and regular rhythm.   Murmur (early systolic, upper LSB, radiates to bilateral neck. ) heard. Pulmonary/Chest: Effort normal and breath sounds normal. No respiratory distress. She has no wheezes. She has no rales.  Abdominal: Soft. There is tenderness (mild epigastric.). There is no rebound and no guarding.  Musculoskeletal: She exhibits no edema or tenderness.       Cervical back: She exhibits normal range of motion, no tenderness and no bony tenderness.  No LE edema.   Neurological: She is alert and oriented to person, place, and time. She has normal strength and normal reflexes. No cranial nerve deficit or sensory deficit. She displays a negative Romberg sign. Coordination normal. GCS eye subscore is 4. GCS verbal subscore is 5. GCS motor subscore is 6.  Skin: Skin is warm and dry.  Psychiatric: She has a normal mood and affect.  Nursing note and vitals reviewed.   ED Course  Procedures (including critical care time) Labs Review Labs Reviewed  URINALYSIS, ROUTINE W REFLEX MICROSCOPIC - Abnormal; Notable for the following:    APPearance HAZY (*)    All other components within normal limits  I-STAT CHEM 8, ED - Abnormal; Notable for the following:    Glucose, Bld 109 (*)    All other components within normal limits  URINE CULTURE  CBC WITH DIFFERENTIAL  HEPATIC FUNCTION PANEL  LIPASE, BLOOD  I-STAT TROPOININ, ED    Imaging Review Ct Head Wo Contrast  04/20/2014   CLINICAL DATA:  78 year old female with headache  EXAM: CT HEAD WITHOUT CONTRAST  TECHNIQUE: Contiguous axial images were obtained from the base of the skull through the vertex without intravenous contrast.  COMPARISON:  Prior CT scan of the sinuses 01/19/2010  FINDINGS: Negative for acute intracranial hemorrhage, acute infarction, mass, mass effect, hydrocephalus or midline shift. Gray-white differentiation is preserved throughout.  Mild cortical atrophy. Periventricular, subcortical and deep white matter hypoattenuation is nonspecific but most consistent with the sequelae of longstanding chronic microvascular ischemic white matter disease. Focal calcification present on the right aspect of the anterior falx. No focal soft tissue or calvarial abnormality. Globes and orbits are symmetric bilaterally. Mucoperiosteal thickening noted throughout the ethmoid air cells. Small air-fluid level layering within the sphenoid sinus. Atherosclerotic calcification of both cavernous and supra clinoid carotid arteries. The mastoid air  cells are well aerated.  IMPRESSION: 1. No acute intracranial abnormality. 2. Diffuse mucoperiosteal thickening and partial opacification of the visualized ethmoid air cells. Small air-fluid level in the right sphenoid air cell. Findings suggest inflammatory paranasal sinus disease. Acute sinusitis is not excluded. 3. Intracranial atherosclerosis. 4. Mild cortical atrophy and chronic ischemic microvascular white matter disease.   Electronically Signed   By: Malachy Moan M.D.   On: 04/20/2014 07:20     EKG Interpretation   Date/Time:  Sunday April 20 2014 07:24:42 EST Ventricular Rate:  61 PR Interval:  298 QRS Duration: 100 QT Interval:  398 QTC Calculation: 401 R Axis:   -26 Text Interpretation:  Sinus rhythm Prolonged PR interval Abnormal R-wave  progression, early transition Inferior infarct, old Lateral leads are also  involved Baseline wander in lead(s) V6 No significant change since last  tracing Confirmed by ALLEN  MD, ANTHONY ([id number]) on 04/20/2014 7:41:32 AM       6:15 AM Patient seen and examined. Medications ordered. Informed of results to this point. CT ordered due to new HA, reports of being 'off balance'. Also EKG and troponin added given epigastric pain and cardiac history, although this is likely her GERD. GI cocktail ordered.   Vital signs reviewed and are as follows: BP 145/86 mmHg   Pulse 63  Temp(Src) 98.9 F (37.2 C) (Oral)  Resp 19  Ht 5' (1.524 m)  Wt 177 lb (80.287 kg)  BMI 34.57 kg/m2  SpO2 93%   8:10 AM CT shows sinusitis which is likely cause of patient's HA. Results reviewed with and patient seen by Dr. Freida Busman. Will d/c to home with treatment for acute sinusitis (doxycycline as patient is pen allergic, nasal steroid).   Patient was counseled on symptoms that should indicate their return to the ED.  These include severe worsening headache, vision changes, confusion, loss of consciousness, trouble walking, nausea & vomiting, or weakness/tingling in extremities.      MDM   Final diagnoses:  Headache  Acute ethmoidal sinusitis, recurrence not specified   HA: 78 yo F with new-type of headache with associated nasal congestion and facial pain. CT shows sinusitis. Patient without high-risk features of headache including: sudden onset/thunderclap HA, altered mental status, accompanying seizure, headache with exertion, history of immunocompromise, neck or shoulder pain, fever, use of anticoagulation, family history of spontaneous SAH, concomitant drug use, toxic exposure.   Patient has a normal complete neurological exam, normal vital signs, normal level of consciousness, no signs of meningismus, is well-appearing/non-toxic appearing, no papilledema, no signs of trauma, no pain over the temporal arteries.   Abd pain: abd is soft, mildly tender in epigastrium, pain is same as previous GERD, labs are normal, UA is normal, suspect this is patient's typical gastritis/GERD and she should continue with her usual therapy and f/u with PCP for recheck. Trop neg, EKG unchanged so doubt this is anginal equivalent.   No dangerous or life-threatening conditions suspected or identified by history, physical exam, and by work-up. No indications for hospitalization identified.      Renne Crigler, PA-C 04/20/14 [phone]

## 2014-04-20 NOTE — Discharge Instructions (Signed)
Please read and follow all provided instructions.  Your diagnoses today include:  1. Acute ethmoidal sinusitis, recurrence not specified   2. Headache     Tests performed today include:  CT of your head which shows sinus infection  Blood counts and electrolytes - normal  Urine test - normal  EKG and blood test for your heart - no signs of heart attack  Vital signs. See below for your results today.   Medications:   Doxycycline - antibiotic  You have been prescribed an antibiotic medicine: take the entire course of medicine even if you are feeling better. Stopping early can cause the antibiotic not to work.   Take any prescribed medications only as directed.  Additional information:  Follow any educational materials contained in this packet.  Your headache today does not appear to be life-threatening or require hospitalization, but often the exact cause of headaches is not determined in the emergency department. Therefore, follow-up with your doctor is very important to find out what may have caused your headache and whether or not you need any further diagnostic testing or treatment.   Sometimes headaches can appear benign (not harmful), but then more serious symptoms can develop which should prompt an immediate re-evaluation by your doctor or the emergency department.  BE VERY CAREFUL not to take multiple medicines containing Tylenol (also called acetaminophen). Doing so can lead to an overdose which can damage your liver and cause liver failure and possibly death.   Follow-up instructions: Please follow-up with your primary care provider in the next 3 days for further evaluation of your symptoms.   Return instructions:   Please return to the Emergency Department if you experience worsening symptoms.  Return if the medications do not resolve your headache, if it recurs, or if you have multiple episodes of vomiting or cannot keep down fluids.  Return if you have a change  from the usual headache.  RETURN IMMEDIATELY IF you:  Develop a sudden, severe headache  Develop confusion or become poorly responsive or faint  Develop a fever above 100.41F or problem breathing  Have a change in speech, vision, swallowing, or understanding  Develop new weakness, numbness, tingling, incoordination in your arms or legs  Have a seizure  Please return if you have any other emergent concerns.  Additional Information:  Your vital signs today were: BP 134/98 mmHg   Pulse 59   Temp(Src) 98.9 F (37.2 C) (Oral)   Resp 16   Ht 5' (1.524 m)   Wt 177 lb (80.287 kg)   BMI 34.57 kg/m2   SpO2 98% If your blood pressure (BP) was elevated above 135/85 this visit, please have this repeated by your doctor within one month. --------------

## 2014-04-20 NOTE — ED Notes (Signed)
Pt c/o headache, abd pain, nausea without vomiting.  Also diarrhea. Onset last week

## 2014-04-21 LAB — URINE CULTURE: Colony Count: 3000

## 2014-04-23 ENCOUNTER — Encounter: Payer: Self-pay | Admitting: Neurology

## 2014-04-28 ENCOUNTER — Encounter: Payer: Self-pay | Admitting: Internal Medicine

## 2014-04-28 ENCOUNTER — Ambulatory Visit (INDEPENDENT_AMBULATORY_CARE_PROVIDER_SITE_OTHER): Payer: Medicare Other | Admitting: Internal Medicine

## 2014-04-28 DIAGNOSIS — J4530 Mild persistent asthma, uncomplicated: Secondary | ICD-10-CM

## 2014-04-28 DIAGNOSIS — I10 Essential (primary) hypertension: Secondary | ICD-10-CM

## 2014-04-28 MED ORDER — BUDESONIDE-FORMOTEROL FUMARATE 80-4.5 MCG/ACT IN AERO: INHALATION_SPRAY | RESPIRATORY_TRACT | Status: DC

## 2014-04-28 NOTE — Patient Instructions (Signed)
When you finish the symbicort 160 switch to the 80 Take 2 puffs first thing in am and then another 2 puffs about 12 hours later.   Please schedule a follow up office visit in 6 weeks, call sooner if needed

## 2014-04-28 NOTE — Progress Notes (Signed)
Subjective:    Patient ID: [name] [name], female    DOB: 17-Feb-1936, 78 y.o.   MRN: [id number]    Brief patient profile:  72 yobf  never smoker with CAD for FU of reactive airway disease - trigger ? GERD & mild AS   10/29/09 - initial  This was of insidious onset - - She had an ER visit during a trip to [place] - echo nml LV fn, BNP 55, stress myoview nml, given cipro for 'bronchitis.'  CXR 08/10/09 shows bibasal interstitial prominence, lt volume loss with raised hemidiaphragm  CT angio chest 01/2008 was neg for PE, stable RUL 13mm nodule as compared to 05/2005, no obvious fibrosis. CT sinuses 8/11 nml  She reports intermittent heartburn with protonix , had an EGD by Dr Collene Mares in 11/10  PFTs in 03/2009 were essentially nml, no obstruction, nml lung volumes & diffusion.  She had a sleep study at [facility]. Spirometry 6/11 shows nml lung function, RAST neg, Labs showed neg ANA, and low ESR. RA factor was elevated.  ER visit on 01/09/10 - absolute eos count 800 ,BNP 69, CXR - cardiomegaly, was on prednisone .  Sep, 2011 >> Referred to Rheum Ouida Sills) - favors OA rather than RA, CCP neg, CK was high 709 - muscle cramps, crestor stopped Stopped advair x 2 weeks - 'ran out', beta blockers stopped       11/04/2013 ov  Chief Complaint  Patient presents with  . Follow-up    Pt states she is still wheezing. Pt finished zpak and recieved a shot, pt unsure of injection, at family doctor 2 weeks ago. Pt c/o increase SOB and productive cough with thick white mucous and c/o intermittent midsternal CP .  She developed fever and cough 2 weeks ago, took a Z-Pak and a shot of? Solu-Medrol. Says she takes Protonix when necessary rec Prednisone 10 mg tabs  Take 2 tabs daily with food x 5ds, then 1 tab daily with food x 5ds then STOP -   Stay on symbicort 2 puffs twice daily Use albuterol as needed only for wheezing Take protonix daily x 6 weeks -make appt with Dr Collene Mares for reflux   12/23/2013 acute  extended ov/Mohamedamin Nifong re: ? asthma exac  Chief Complaint  Patient presents with  . Acute Visit    Pt c/o increased cough, DOE and fatigue x 3 wks. Cough is prod with minimal clear sputum.    better while on prednisone, but husband took her pill  symbicort only took one pff daily "I thought that's what 2 twice daily meant" protonix 40 mg 30-60 min breakfast Sob varies from at rest to just with exertion depending how recently she took pred as does saba dependency  rec Prednisone 10 mg take  4 each am x 2 days,   2 each am x 2 days,  1 each am x 2 days and stop  Symbicort 160 Take 2 puffs first thing in am and then another 2 puffs about 12 hours later.  Keep taking protonix 40 mg Take 30-60 min before first meal of the day but add pepcid 20 mg at bedtime until you see Dr Collene Mares GERD   For cough > take tramadol 50 mg one every 4 hour as needed  For breathing > albuterol per neb up to every 4 hours as needed    01/10/14 [place] is to keep reflux under control Stay on protonix daytime & pepcid at night Keep appt with Dr Collene Mares Prednisone  10 mg tabs  Take 2 tabs daily with food x 7ds, then 1 tab daily with food x 7ds then STOP  # 40 Stay on symbicort Albuterol MDI 2 puffs upto 4 times daily as needed for wheezing Start singulair 10 mg at bedtime> never started it    03/31/2014 f/u ov/Edith Groleau re: ? Asthma  Chief Complaint  Patient presents with  . Follow-up    Pt states that her breathing is doing better since the last visit. She states does not have rescue inhaler, and has neb but has not needed since last visit.    maintaining protonix 40 mg ac and pepcid at bedtime but easily confused with names of meds and instructions rec Continue symbicort 160 Take 2 puffs first thing in am and then another 2 puffs about 12 hours later.  Work on inhaler technique:    Only use your albuterol as a rescue medication  Stop cozar and take diovan (valsartan) 160 mg one daily          04/28/2014 f/u ov/Nakoa Ganus re:  cough/ throat clearing/ ? Pseudoasthma Chief Complaint  Patient presents with  . Follow-up    Pt states that her breathing seems to be doing better since her last visit. No wheezing, cough or need for rescue inhaler or neb.   symbicort 160 2bid and no need rescue at all, Not limited by breathing from desired activities     No obvious patterns in day to day or daytime variabilty or assoc cough or  cp or chest tightness, subjective wheeze overt sinus or hb symptoms. No unusual exp hx or h/o childhood pna/ asthma or knowledge of premature birth.  Sleeping ok without nocturnal  or early am exacerbation  of respiratory  c/o's or need for noct saba. Also denies any obvious fluctuation of symptoms with weather or environmental changes or other aggravating or alleviating factors except as outlined above   Current Medications, Allergies, Complete Past Medical History, Past Surgical History, Family History, and Social History were reviewed in [employer] record.  ROS  The following are not active complaints unless bolded sore throat, dysphagia, dental problems, itching, sneezing,  nasal congestion or excess/ purulent secretions, ear ache,   fever, chills, sweats, unintended wt loss, pleuritic or exertional cp, hemoptysis,  orthopnea pnd or leg swelling, presyncope, palpitations, heartburn, abdominal pain, anorexia, nausea, vomiting, diarrhea  or change in bowel or urinary habits, change in stools or urine, dysuria,hematuria,  rash, arthralgias, visual complaints, headache, numbness weakness or ataxia or problems with walking or coordination,  change in mood/affect or memory.         07/24/2013 Spirometry no airway obsn, FEV1 97% -1.53  Rpt echo  - mild AS         Objective:  Physical Exam   04/28/2014      183  Wt Readings from Last 3 Encounters:  03/31/14 187 lb (84.823 kg)  02/17/14 187 lb (84.823 kg)  01/10/14 188 lb 6.4 oz (85.458 kg)      amb bf nad /vital signs  reviewed/ bp fine       HEENT: nl dentition, turbinates, and orophanx. Nl external ear canals without cough reflex   NECK :  without JVD/Nodes/TM/ nl carotid upstrokes bilaterally   LUNGS: no acc muscle use, clear to A and P bilaterally without cough on insp or exp maneuvers   CV:  RRR  no s3,  II/IV SEM  no increase in P2, no edema   ABD:  soft and nontender with nl excursion in the supine position. No bruits or organomegaly, bowel sounds nl  MS:  warm without deformities, calf tenderness, cyanosis or clubbing      CXR  12/23/2013 : Stable mild to moderate cardiac enlargement. Stable uncoiling of the aorta. Vascular pattern is normal. Lungs are clear. No pleural effusions.   Recent Labs Lab 12/23/13 1304  NA 143  K 3.7  CL 111  CO2 26  BUN 13  CREATININE 0.9  GLUCOSE 87    Recent Labs Lab 12/23/13 1304  HGB 12.8  HCT 37.6  WBC 6.5  PLT 175.0      Lab Results  Component Value Date   PROBNP 43.0 12/23/2013     Lab Results  Component Value Date   TSH 1.08 12/23/2013       Assessment & Plan:   Outpatient Encounter Prescriptions as of 04/28/2014  Medication Sig  . albuterol (PROVENTIL HFA;VENTOLIN HFA) 108 (90 BASE) MCG/ACT inhaler Inhale 2 puffs into the lungs 4 (four) times daily as needed for wheezing or shortness of breath.  Marland Kitchen albuterol (PROVENTIL) (2.5 MG/3ML) 0.083% nebulizer solution Take 3 mLs (2.5 mg total) by nebulization every 6 (six) hours as needed.  . clopidogrel (PLAVIX) 75 MG tablet TAKE 1 TABLET (75 MG TOTAL) BY MOUTH DAILY.  Marland Kitchen Coenzyme Q10 (COQ10 PO) Take 1 tablet by mouth daily.   . CRESTOR 20 MG tablet TAKE 1 TABLET (20 MG TOTAL) BY MOUTH DAILY.  . famotidine (PEPCID) 20 MG tablet One at bedtime  . mometasone (NASONEX) 50 MCG/ACT nasal spray Place 2 sprays into the nose daily.  . nitroGLYCERIN (NITROSTAT) 0.4 MG SL tablet Place 0.4 mg under the tongue every 5 (five) minutes as needed for chest pain.   . pantoprazole (PROTONIX) 40  MG tablet 1 every am 30 min before first meal  . Polyethyl Glycol-Propyl Glycol (SYSTANE OP) Place 1 drop into both eyes daily as needed (dry eyes).  Marland Kitchen sulfamethoxazole-trimethoprim (BACTRIM DS,SEPTRA DS) 800-160 MG per tablet Take 1 tablet by mouth 2 (two) times daily. for 10 days  . traMADol (ULTRAM) 50 MG tablet Take 1 tablet (50 mg total) by mouth 2 (two) times daily as needed (for cough or pain).  . valsartan (DIOVAN) 160 MG tablet Take 1 tablet (160 mg total) by mouth daily.  . Vitamin D, Ergocalciferol, (DRISDOL) 50000 UNITS CAPS capsule Take 50,000 Units by mouth every 7 (seven) days.   . [DISCONTINUED] budesonide-formoterol (SYMBICORT) 160-4.5 MCG/ACT inhaler Inhale 2 puffs into the lungs 2 (two) times daily as needed (shortness of breath).   . budesonide-formoterol (SYMBICORT) 80-4.5 MCG/ACT inhaler Take 2 puffs first thing in am and then another 2 puffs about 12 hours later.  . [DISCONTINUED] budesonide-formoterol (SYMBICORT) 80-4.5 MCG/ACT inhaler Take 2 puffs first thing in am and then another 2 puffs about 12 hours later.  . [DISCONTINUED] doxycycline (VIBRAMYCIN) 100 MG capsule Take 1 capsule (100 mg total) by mouth 2 (two) times daily. (Patient not taking: Reported on 04/28/2014)

## 2014-04-28 NOTE — Assessment & Plan Note (Addendum)
NPFTs in 03/2009 were essentially nml, no obstruction, nml lung volumes & diffusion - improved symptoms p stopped losartan 04/01/14  Not clear how much of her cough/ wheeze in retrospect were ever truly a primary airway problem and  Now that she's on max gerd rx and on an arb not aso c with acei like effects she's doing well enough to try taper symbicort to 80 bid and then even consider trial off at next ov    Each maintenance medication was reviewed in detail including most importantly the difference between maintenance and as needed and under what circumstances the prns are to be used.  Please see instructions for details which were reviewed in writing and the patient given a copy.    The proper method of use, as well as anticipated side effects, of a metered-dose inhaler are discussed and demonstrated to the patient. Improved effectiveness after extensive coaching during this visit to a level of approximately  90% from a baseline of 50% so should be able to do just as well witht he 80 2bid dosing

## 2014-04-28 NOTE — Assessment & Plan Note (Signed)
Changed to diovan in place of losartan 04/01/2014 due to concerns with pseudoasthma > resolved   Adequate control on present rx, reviewed > no change in rx needed  = valsartan 160 mg daily

## 2014-04-29 ENCOUNTER — Encounter: Payer: Self-pay | Admitting: Neurology

## 2014-05-12 ENCOUNTER — Other Ambulatory Visit: Payer: Self-pay | Admitting: Cardiology

## 2014-05-20 ENCOUNTER — Ambulatory Visit: Payer: Medicare Other | Admitting: Adult Health

## 2014-05-29 ENCOUNTER — Telehealth: Payer: Self-pay | Admitting: Pulmonary Disease

## 2014-05-29 NOTE — Telephone Encounter (Signed)
proventil not covered -pro-air & ventolin are Pl send Rx for generic albuterol x 62m x 3 [facility]

## 2014-06-02 ENCOUNTER — Other Ambulatory Visit: Payer: Self-pay | Admitting: *Deleted

## 2014-06-02 MED ORDER — ALBUTEROL SULFATE HFA 108 (90 BASE) MCG/ACT IN AERS: 2.0000 | INHALATION_SPRAY | Freq: Four times a day (QID) | RESPIRATORY_TRACT | Status: DC | PRN

## 2014-06-02 NOTE — Telephone Encounter (Signed)
[facility]. Nothing further needed.

## 2014-06-02 NOTE — Telephone Encounter (Signed)
Ventolin Inhaler sent to [facility].

## 2014-06-05 ENCOUNTER — Telehealth: Payer: Self-pay | Admitting: Pulmonary Disease

## 2014-06-05 MED ORDER — ALBUTEROL SULFATE HFA 108 (90 BASE) MCG/ACT IN AERS: 2.0000 | INHALATION_SPRAY | Freq: Four times a day (QID) | RESPIRATORY_TRACT | Status: DC | PRN

## 2014-06-05 NOTE — Telephone Encounter (Signed)
[facility] and they stated that the ventolin is not covered by insurance but they will cover the proair.  This has been changed and they will get this out to the pt. Nothing further is needed.

## 2014-06-09 ENCOUNTER — Encounter: Payer: Self-pay | Admitting: Internal Medicine

## 2014-06-09 ENCOUNTER — Other Ambulatory Visit (INDEPENDENT_AMBULATORY_CARE_PROVIDER_SITE_OTHER): Payer: Medicare Other

## 2014-06-09 ENCOUNTER — Ambulatory Visit (INDEPENDENT_AMBULATORY_CARE_PROVIDER_SITE_OTHER): Payer: Medicare Other | Admitting: Internal Medicine

## 2014-06-09 VITALS — BP 140/80 | HR 75 | Ht 60.0 in | Wt 178.4 lb

## 2014-06-09 DIAGNOSIS — R05 Cough: Secondary | ICD-10-CM

## 2014-06-09 DIAGNOSIS — R059 Cough, unspecified: Secondary | ICD-10-CM

## 2014-06-09 DIAGNOSIS — R058 Other specified cough: Secondary | ICD-10-CM

## 2014-06-09 DIAGNOSIS — J4520 Mild intermittent asthma, uncomplicated: Secondary | ICD-10-CM

## 2014-06-09 DIAGNOSIS — I10 Essential (primary) hypertension: Secondary | ICD-10-CM

## 2014-06-09 DIAGNOSIS — R0600 Dyspnea, unspecified: Secondary | ICD-10-CM

## 2014-06-09 LAB — BASIC METABOLIC PANEL
BUN: 17 mg/dL (ref 6–23)
CO2: 26 mEq/L (ref 19–32)
Calcium: 9.2 mg/dL (ref 8.4–10.5)
Chloride: 108 mEq/L (ref 96–112)
Creatinine, Ser: 0.8 mg/dL (ref 0.4–1.2)
GFR: 96.45 mL/min (ref >60.00)
Glucose, Bld: 78 mg/dL (ref 70–99)
Potassium: 3.9 mEq/L (ref 3.5–5.1)
Sodium: 143 mEq/L (ref 135–145)

## 2014-06-09 LAB — CBC WITH DIFFERENTIAL/PLATELET
Basophils Absolute: 0 10*3/uL (ref 0.0–0.1)
Basophils Relative: 0.1 % (ref 0.0–3.0)
Eosinophils Absolute: 0.3 10*3/uL (ref 0.0–0.7)
Eosinophils Relative: 3.6 % (ref 0.0–5.0)
HCT: 40.7 % (ref 36.0–46.0)
Hemoglobin: 13.3 g/dL (ref 12.0–15.0)
Lymphocytes Relative: 20.8 % (ref 12.0–46.0)
Lymphs Abs: 2 10*3/uL (ref 0.7–4.0)
MCHC: 32.7 g/dL (ref 30.0–36.0)
MCV: 92.2 fl (ref 78.0–100.0)
Monocytes Absolute: 0.7 10*3/uL (ref 0.1–1.0)
Monocytes Relative: 7.7 % (ref 3.0–12.0)
Neutro Abs: 6.6 10*3/uL (ref 1.4–7.7)
Neutrophils Relative %: 67.8 % (ref 43.0–77.0)
Platelets: 186 10*3/uL (ref 150.0–400.0)
RBC: 4.42 Mil/uL (ref 3.87–5.11)
RDW: 13.9 % (ref 11.5–15.5)
WBC: 9.7 10*3/uL (ref 4.0–10.5)

## 2014-06-09 LAB — SEDIMENTATION RATE: Sed Rate: 21 mm/hr (ref 0–22)

## 2014-06-09 LAB — TSH: TSH: 2.18 u[IU]/mL (ref 0.35–4.50)

## 2014-06-09 NOTE — Progress Notes (Signed)
Subjective:    Patient ID: [name] [name], female    DOB: May 11, 1936, 79 y.o.   MRN: [id number]    Brief patient profile:  59 yobf  never smoker with CAD for FU of reactive airway disease - trigger ? GERD & mild AS   10/29/09 - initial  This was of insidious onset - - She had an ER visit during a trip to [place] - echo nml LV fn, BNP 55, stress myoview nml, given cipro for 'bronchitis.'  CXR 08/10/09 shows bibasal interstitial prominence, lt volume loss with raised hemidiaphragm  CT angio chest 01/2008 was neg for PE, stable RUL 62m nodule as compared to 05/2005, no obvious fibrosis. CT sinuses 8/11 nml  She reports intermittent heartburn with protonix , had an EGD by Dr MCollene Maresin 11/10  PFTs in 03/2009 were essentially nml, no obstruction, nml lung volumes & diffusion.  [facility] Spirometry 6/11 shows nml lung function, RAST neg, Labs showed neg ANA, and low ESR. RA factor was elevated.  ER visit on 01/09/10 - absolute eos count 800 ,BNP 69, CXR - cardiomegaly, was on prednisone .  Sep, 2011 >> Referred to Rheum (Ouida Sills - favors OA rather than RA, CCP neg, CK was high 709 - muscle cramps, crestor stopped Stopped advair x 2 weeks - 'ran out', beta blockers stopped     01/10/14 [name] eval  Key is to keep reflux under control Stay on protonix daytime & pepcid at night Keep appt with Dr MCollene MaresPrednisone 10 mg tabs  Take 2 tabs daily with food x 7ds, then 1 tab daily with food x 7ds then STOP  # 40 Stay on symbicort Albuterol MDI 2 puffs upto 4 times daily as needed for wheezing Start singulair 10 mg at bedtime> never started it    03/31/2014 f/u ov/Ariyon Mittleman re: ? Asthma  Chief Complaint  Patient presents with  . Follow-up    Pt states that her breathing is doing better since the last visit. She states does not have rescue inhaler, and has neb but has not needed since last visit.    maintaining protonix 40 mg ac and pepcid at bedtime but easily confused with  names of meds and instructions rec Continue symbicort 160 Take 2 puffs first thing in am and then another 2 puffs about 12 hours later.  Work on inhaler technique:    Only use your albuterol as a rescue medication  Stop cozar and take diovan (valsartan) 160 mg one daily          04/28/2014 f/u ov/Casey Fye re: cough/ throat clearing/ ? Pseudoasthma Chief Complaint  Patient presents with  . Follow-up    Pt states that her breathing seems to be doing better since her last visit. No wheezing, cough or need for rescue inhaler or neb.   symbicort 160 2bid and no need rescue at all, Not limited by breathing from desired activities  rec When you finish the symbicort 160 switch to the 80 Take 2 puffs first thing in am and then another 2 puffs about 12 hours later.   06/09/2014 f/u ov/Braxtyn Bojarski re: asthma vs uacs no longer on any inhalers at all x one month and doing well  Chief Complaint  Patient presents with  . Follow-up    Pt stats that her breathing is doing great. She has not had to use rescue inhaler since the last visit. She states since she started on valsartan end of Oct 2015  she has noticed joint pain and loss of appetite.     Not limited by breathing from desired activities    No obvious patterns in day to day or daytime variabilty or assoc cough or  cp or chest tightness, subjective wheeze overt sinus or hb symptoms. No unusual exp hx or h/o childhood pna/ asthma or knowledge of premature birth.  Sleeping ok without nocturnal  or early am exacerbation  of respiratory  c/o's or need for noct saba. Also denies any obvious fluctuation of symptoms with weather or environmental changes or other aggravating or alleviating factors except as outlined above   Current Medications, Allergies, Complete Past Medical History, Past Surgical History, Family History, and Social History were reviewed in [employer] record.  ROS  The following are not active complaints unless  bolded sore throat, dysphagia, dental problems, itching, sneezing,  nasal congestion or excess/ purulent secretions, ear ache,   fever, chills, sweats, unintended wt loss, pleuritic or exertional cp, hemoptysis,  orthopnea pnd or leg swelling, presyncope, palpitations, heartburn, abdominal pain, anorexia, nausea, vomiting, diarrhea  or change in bowel or urinary habits, change in stools or urine, dysuria,hematuria,  rash, arthralgias, visual complaints, headache, numbness weakness or ataxia or problems with walking or coordination,  change in mood/affect or memory.         07/24/2013 Spirometry no airway obsn, FEV1 97% -1.53  Rpt echo  - mild AS         Objective:  Physical Exam   04/28/2014      183 >  06/09/13 178  Wt Readings from Last 3 Encounters:  03/31/14 187 lb (84.823 kg)  02/17/14 187 lb (84.823 kg)  01/10/14 188 lb 6.4 oz (85.458 kg)      amb bf nad /vital signs reviewed/ bp fine       HEENT: nl dentition, turbinates, and orophanx. Nl external ear canals without cough reflex   NECK :  without JVD/Nodes/TM/ nl carotid upstrokes bilaterally   LUNGS: no acc muscle use, clear to A and P bilaterally without cough on insp or exp maneuvers   CV:  RRR  no s3,  II/IV SEM  no increase in P2, no edema   ABD:  soft and nontender with nl excursion in the supine position. No bruits or organomegaly, bowel sounds nl  MS:  warm without deformities, calf tenderness, cyanosis or clubbing      CXR  12/23/2013 : Stable mild to moderate cardiac enlargement. Stable uncoiling of the aorta. Vascular pattern is normal. Lungs are clear. No pleural effusions.             Recent Labs Lab 06/09/14 0957  NA 143  K 3.9  CL 108  CO2 26  BUN 17  CREATININE 0.8  GLUCOSE 78    Recent Labs Lab 06/09/14 0957  HGB 13.3  HCT 40.7  WBC 9.7  PLT 186.0     Lab Results  Component Value Date   TSH 2.18 06/09/2014     Lab Results  Component Value Date   PROBNP 43.0  12/23/2013     Lab Results  Component Value Date   ESRSEDRATE 21 06/09/2014        Assessment & Plan:   Outpatient Encounter Prescriptions as of 06/09/2014  Medication Sig  . clopidogrel (PLAVIX) 75 MG tablet TAKE 1 TABLET (75 MG TOTAL) BY MOUTH DAILY.  Marland Kitchen Coenzyme Q10 (COQ10 PO) Take 1 tablet by mouth daily.   . CRESTOR 20 MG tablet  TAKE 1 TABLET (20 MG TOTAL) BY MOUTH DAILY.  . famotidine (PEPCID) 20 MG tablet One at bedtime  . nitroGLYCERIN (NITROSTAT) 0.4 MG SL tablet Place 0.4 mg under the tongue every 5 (five) minutes as needed for chest pain.   . pantoprazole (PROTONIX) 40 MG tablet 1 every am 30 min before first meal  . Polyethyl Glycol-Propyl Glycol (SYSTANE OP) Place 1 drop into both eyes daily as needed (dry eyes).  . valsartan (DIOVAN) 160 MG tablet Take 1 tablet (160 mg total) by mouth daily.  . Vitamin D, Ergocalciferol, (DRISDOL) 50000 UNITS CAPS capsule Take 50,000 Units by mouth every 7 (seven) days.   . [DISCONTINUED] albuterol (PROAIR HFA) 108 (90 BASE) MCG/ACT inhaler Inhale 2 puffs into the lungs every 6 (six) hours as needed for wheezing or shortness of breath. (Patient not taking: Reported on 06/09/2014)  . [DISCONTINUED] albuterol (PROVENTIL) (2.5 MG/3ML) 0.083% nebulizer solution Take 3 mLs (2.5 mg total) by nebulization every 6 (six) hours as needed. (Patient not taking: Reported on 06/09/2014)  . [DISCONTINUED] budesonide-formoterol (SYMBICORT) 80-4.5 MCG/ACT inhaler Take 2 puffs first thing in am and then another 2 puffs about 12 hours later. (Patient not taking: Reported on 06/09/2014)  . [DISCONTINUED] clopidogrel (PLAVIX) 75 MG tablet TAKE 1 TABLET (75 MG TOTAL) BY MOUTH DAILY.  . [DISCONTINUED] CRESTOR 20 MG tablet TAKE 1 TABLET (20 MG TOTAL) BY MOUTH DAILY.  . [DISCONTINUED] mometasone (NASONEX) 50 MCG/ACT nasal spray Place 2 sprays into the nose daily. (Patient not taking: Reported on 06/09/2014)  . [DISCONTINUED] sulfamethoxazole-trimethoprim (BACTRIM DS,SEPTRA DS)  800-160 MG per tablet Take 1 tablet by mouth 2 (two) times daily. for 10 days  . [DISCONTINUED] traMADol (ULTRAM) 50 MG tablet Take 1 tablet (50 mg total) by mouth 2 (two) times daily as needed (for cough or pain). (Patient not taking: Reported on 06/09/2014)

## 2014-06-09 NOTE — Patient Instructions (Signed)
Please remember to go to the lab department downstairs for your tests - we will call you with the results when they are available.   If you are satisfied with your treatment plan,  let your doctor know and he/she can either refill your medications or you can return here when your prescription runs out.     If in any way you are not 100% satisfied,  please tell us.  If 100% better, tell your friends!  Pulmonary follow up is as needed      

## 2014-06-10 ENCOUNTER — Telehealth: Payer: Self-pay | Admitting: Internal Medicine

## 2014-06-10 NOTE — Telephone Encounter (Signed)
Called and spoke with pt and she is aware of lab results per MW.  Pt voiced her understanding and nothing further is needed.

## 2014-06-10 NOTE — Progress Notes (Signed)
Quick Note:  LMTCB ______ 

## 2014-06-11 ENCOUNTER — Encounter: Payer: Self-pay | Admitting: Internal Medicine

## 2014-06-11 NOTE — Assessment & Plan Note (Signed)
Changed to diovan in place of losartan 04/01/2014 due to concerns with pseudoasthma > resolved at ov 04/28/2014   bp well controlled and doubt any of her present non-specific complaints are related to bp or bp meds though I would avoid losartan here based on  For reasons that may related to vascular permability and nitric oxide pathways but not elevated  bradykinin levels (as seen with  ACEi use) losartan in the generic form has been reported now from mulitple sources  to cause a similar pattern of non-specific  upper airway symptoms as seen with acei.   This has not been reported with exposure to the other ARB's to date, so it seems reasonable for now to try either generic diovan or avapro if ARB needed or use an alternative class altogether.  See:  Dewayne Hatch Allergy Asthma Immunol  2008: 101: p [phone]

## 2014-06-11 NOTE — Assessment & Plan Note (Signed)
Improved with no evidence of limiting airflow off all inhalers and no anemia or chf  No need for pulmonary f/u at this point

## 2014-06-11 NOTE — Assessment & Plan Note (Signed)
-   CT head 04/20/14 Diffuse mucoperiosteal thickening and partial opacification of the visualized ethmoid air cells. Small air-fluid level in the right sphenoid air cell. Findings suggest inflammatory paranasal sinus disease. Acute sinusitis is not excluded    Classic Upper airway cough syndrome, so named because it's frequently impossible to sort out how much is  CR/sinusitis with freq throat clearing (which can be related to primary GERD)   vs  causing  secondary (" extra esophageal")  GERD from wide swings in gastric pressure that occur with throat clearing, often  promoting self use of mint and menthol lozenges that reduce the lower esophageal sphincter tone and exacerbate the problem further in a cyclical fashion.   These are the same pts (now being labeled as having "irritable larynx syndrome" by some cough centers) who not infrequently have a history of having failed to tolerate ace inhibitors,  dry powder inhalers or biphosphonates or report having atypical reflux symptoms that don't respond to standard doses of PPI , and are easily confused as having aecopd or asthma flares by even experienced allergists/ pulmonologists.   She is doing great on a regimen directed at GERD only and time will tell whether that's enough to control patter of recurrent cyclical cough  I had an extended discussion with the patient reviewing all relevant studies completed to date and  lasting 15 to 20 minutes of a 25 minute visit on the following ongoing concerns: Explained the natural history of uri and why it's necessary in patients at risk to treat GERD aggressively - at least  short term -   to reduce risk of evolving cyclical cough initially  triggered by epithelial injury and a heightened sensitivty to the effects of any upper airway irritants,  most importantly acid - related - then perpetuated by epithelial injury related to the cough itself as the upper airway collapses on itself.  That is, the more  sensitive the epithelium becomes once it is damaged by the virus, the more the ensuing irritability> the more the cough, the more the secondary reflux (especially in those prone to reflux) the more the irritation of the sensitive mucosa and so on in a  Classic cyclical pattern.

## 2014-06-11 NOTE — Assessment & Plan Note (Signed)
PFTs in 03/2009 were essentially nml, no obstruction, nml lung volumes & diffusion - improved symptoms p stopped losartan 04/01/14 - 04/28/2014 p extensive coaching HFA effectiveness =    90%   No evidence of active asthma, ok to have saba on hand prn and return here if the cough/ breathing flare despite adequate gerd rx

## 2014-06-13 ENCOUNTER — Encounter: Payer: Self-pay | Admitting: Cardiology

## 2014-06-13 ENCOUNTER — Ambulatory Visit (INDEPENDENT_AMBULATORY_CARE_PROVIDER_SITE_OTHER): Payer: Medicare Other | Admitting: Cardiology

## 2014-06-13 VITALS — BP 122/73 | HR 73 | Ht 61.0 in | Wt 178.0 lb

## 2014-06-13 DIAGNOSIS — E785 Hyperlipidemia, unspecified: Secondary | ICD-10-CM

## 2014-06-13 DIAGNOSIS — R058 Other specified cough: Secondary | ICD-10-CM

## 2014-06-13 DIAGNOSIS — R05 Cough: Secondary | ICD-10-CM

## 2014-06-13 DIAGNOSIS — I2510 Atherosclerotic heart disease of native coronary artery without angina pectoris: Secondary | ICD-10-CM

## 2014-06-13 LAB — LIPID PANEL
Cholesterol: 155 mg/dL (ref 0–200)
HDL: 44.6 mg/dL (ref >39.00)
LDL Cholesterol: 84 mg/dL (ref 0–99)
NonHDL: 110.4
Total CHOL/HDL Ratio: 3
Triglycerides: 132 mg/dL (ref 0.0–149.0)
VLDL: 26.4 mg/dL (ref 0.0–40.0)

## 2014-06-13 NOTE — Patient Instructions (Signed)
Your physician recommends that you have  a FASTING lipid profile today.  Your physician wants you to follow-up in: 1 year with Dr Shirlee Latch. (January 2017).  You will receive a reminder letter in the mail two months in advance. If you don't receive a letter, please call our office to schedule the follow-up appointment.

## 2014-06-15 NOTE — Progress Notes (Signed)
Patient ID: [name] [name], female   DOB: [birth year], 79 y.o.   MRN: [id number] PCP: Eloise Harman  79 yo with history of CAD, asthma, and osteoarthritis presents for cardiology followup.  Patient had a long workup for bronchospasm with Dr. Vassie Loll.  It now appears that she is very sensitive to beta blockers, with bronchospasm/dyspnea with even the most beta-1 selective agents.  She is, therefore, no longer on a beta blocker.  Her aspirin was also stopped due to concern that it could have been contributing to bronchospasm.  Echo (3/15) showed EF 60-65%, with mild AS.   She has been doing well generally. She has occasional leg cramps.  Very rare chest pain.  She did have one episode about 2 weeks ago with burning substernal chest pain.  This was after a meal and she thinks it was GERD.  She gets "tired" if she rushes.  No chest pain or dyspnea with activity.  She is active.    Labs (6/11): LDL 101, HDL 53 Labs (9/11): K 4.6, creatinine 0.9 Labs (7/12): LDL 82, HDL 57, LFTs normal Labs (3/38): K 3.7, creatinine 0.8 Labs (3/13): K 4.4, creatinine 1.1, LDL 58, HDL 41 Labs (9/13): LDL 63, HDL 43 Labs (2/14): K 4.3, creatinine 1.0 Labs (1/15): LDL 57, HDL 41 Labs (1/16): K 3.9, creatinine 0.8, TSH normal, HCT 40.7  Allergies (verified):  1)  ! Pcn 2)  ! Biaxin 3)  ! * Pravastatin 4)  * Contrast Dye  Past Medical History: 1. Coronary artery disease.  Left heart catheterization was done in September 2009 because of exertional chest pain.  It showed an EF of 60% on left ventriculogram, RCA was totally occluded proximally with left-to-right collaterals, the distal left main had a 20% lesion, there was a small ramus with a 60-70% stenosis, and the mid LAD had  serial 60% stenoses with a distal subtotalled LAD.  There was a 50% first diagonal stenosis and a 50% first OM stenosis. There were poor distal targets for bypass surgery and no good interventional target so medical management was planned. Myoview in  [place] in 3/11 was normal per report. Lexiscan myoview (2/13): EF 67%, mild reversible inferior perfusion defect suggestive of mild ischemia.  Echo (3/15) with mild AS, EF 60-65%.  2. Hypertension: amlodipine caused itching.  3. Peptic ulcer disease. 4. Hysterectomy. 5. History of  HBV, HCV 6. History of chronic cough.  ACEI stopped.  Possible contribution from GERD. 7. History of PVCs and PACs. 8. GERD 9. IBS 10. Depression 11. Hyperlipidemia: CPK elevated while on Crestor.  She thinks pravastatin caused a rash.  12. Obesity 13. h/o pancreatitis 14. Osteoporosis 15. Knee osteoarthritis s/p left TKR.  16. Dyspnea/cough: Echo (3/10): EF 55-60%, no regional wall motion abnormalities, aortic sclerosis.  Echo (3/11) in [place]: EF 60%, grade I diastolic dysfunction, normal RV, no regional wall motion abnormalities, valves are ok.  PFTs (10/10): FVC 103%, FEV1 109%, ratio 74%.  Mild obstruction.  CT chest (6/11) did not show evidence for interstitial lung disease.  Possible asthma or upper airways cough syndrome.  Aspirin and beta blocker have been held as potential causes for bronchospasm.   17. Asthma 18. Rheumatoid arthritis.  19. Aortic stenosis: Mild on 3/15 echo.   Family History: Family History Asthma-sister, mother Family History Emphysema-sister Family History Rheumatoid Arthritis-mother Family History Colon Cancer-maternal aunt Family History Lung Cancer-maternal aunt Pancreatic Cancer-brother Family History C V A / Stroke -mother  Social History: The patient does not drink or smoke (  never smoked).   Marital Status:Married lives with husband  Children: Yes Occupation: Retired [employer]  Patient never smoked. (2nd hand smoke exposure x 40 years)  ROS: All systems reviewed and negative except as per HPI  Current Outpatient Prescriptions  Medication Sig Dispense Refill  . clopidogrel (PLAVIX) 75 MG tablet TAKE 1 TABLET (75 MG TOTAL) BY MOUTH DAILY. 90  tablet 0  . Coenzyme Q10 (COQ10 PO) Take 1 tablet by mouth daily.     . CRESTOR 20 MG tablet TAKE 1 TABLET (20 MG TOTAL) BY MOUTH DAILY. 90 tablet 0  . famotidine (PEPCID) 20 MG tablet One at bedtime 30 tablet 2  . pantoprazole (PROTONIX) 40 MG tablet 1 every am 30 min before first meal    . Polyethyl Glycol-Propyl Glycol (SYSTANE OP) Place 1 drop into both eyes daily as needed (dry eyes).    . valsartan (DIOVAN) 160 MG tablet Take 1 tablet (160 mg total) by mouth daily. 30 tablet 11  . Vitamin D, Ergocalciferol, (DRISDOL) 50000 UNITS CAPS capsule Take 50,000 Units by mouth every 7 (seven) days.     . nitroGLYCERIN (NITROSTAT) 0.4 MG SL tablet Place 0.4 mg under the tongue every 5 (five) minutes as needed for chest pain.     Marland Kitchen PROAIR HFA 108 (90 BASE) MCG/ACT inhaler as needed.     No current facility-administered medications for this visit.   BP 122/73 mmHg  Pulse 73  Ht 5\' 1"  (1.549 m)  Wt 178 lb (80.74 kg)  BMI 33.65 kg/m2 General:  Well developed, well nourished, in no acute distress.  Obese.  Neck:  Neck supple, no JVD. No masses, thyromegaly or abnormal cervical nodes. Lungs:  CTAB.  Heart:  Non-displaced PMI, chest non-tender; regular rate and rhythm, S1, S2 without rubs or gallops. 2/6 early systolic ejection murmur at RUSB.  Carotid upstroke normal, no bruit.  Pedals normal pulses. No edema. Abdomen:  Bowel sounds positive; abdomen soft and non-tender without masses, organomegaly, or hernias noted. No hepatosplenomegaly. Extremities:  No clubbing or cyanosis. Neurologic:  Alert and oriented x 3. Psych:  Normal affect.  Assessment/Plan:  1. CAD: Patient has extensive CAD but no good options for revascularization. Last cath showed occluded RCA with left to right collaterals. Myoview done in 2013 showed mild inferior ischemia that likely correlates to the known occluded RCA. No other perfusion defects. I do not think that she is having ischemic symptoms currently (rare atypical  chest pain that may be GERD). Continue Plavix (wheezes with aspirin), ARB, and Crestor. Cannot tolerate beta blockers either due to bronchospasm.   2. Hyperlipidemia:  Check lipids today, continue Crestor.  3. HTN:  BP ok currently.  4. Asthma/upper airways cough syndrome: Follows with Dr 2014.   Sherene Sires 06/15/2014

## 2014-06-23 ENCOUNTER — Other Ambulatory Visit: Payer: Self-pay | Admitting: Internal Medicine

## 2014-06-23 ENCOUNTER — Telehealth: Payer: Self-pay | Admitting: Internal Medicine

## 2014-06-23 NOTE — Telephone Encounter (Signed)
Called and spoke to pt. Pt requesting refill of Pepcid. Informed pt Pepcid is OTC and MW wanted pt f/u as needed so refills will need to go through PCP. Pt verbalized understanding and denied any further questions or concerns at this time.

## 2014-07-31 ENCOUNTER — Ambulatory Visit: Payer: Medicare Other | Admitting: Adult Health

## 2014-08-16 ENCOUNTER — Telehealth: Payer: Self-pay | Admitting: Cardiology

## 2014-08-16 NOTE — Telephone Encounter (Signed)
She has increased leg edema but she is not having SOB.  She has had this before and she has been up on her feet quite a bit.  She has no PND or orthopnea.  Her last EF was normal on echo.  I discussed conservative measures such as putting on the compression stockings that she has and keeping her feet elevated.  She will call on Monday if not improved.

## 2014-08-21 ENCOUNTER — Other Ambulatory Visit: Payer: Self-pay | Admitting: Gastroenterology

## 2014-08-21 DIAGNOSIS — R1033 Periumbilical pain: Secondary | ICD-10-CM

## 2014-08-22 ENCOUNTER — Encounter (HOSPITAL_COMMUNITY): Payer: Self-pay

## 2014-08-22 ENCOUNTER — Ambulatory Visit (HOSPITAL_COMMUNITY)
Admission: RE | Admit: 2014-08-22 | Discharge: 2014-08-22 | Disposition: A | Discharge status: Home / Self Care | Payer: Medicare Other | Source: Ambulatory Visit | Attending: Gastroenterology | Admitting: Gastroenterology

## 2014-08-22 DIAGNOSIS — K869 Disease of pancreas, unspecified: Secondary | ICD-10-CM | POA: Insufficient documentation

## 2014-08-22 DIAGNOSIS — R1033 Periumbilical pain: Secondary | ICD-10-CM | POA: Diagnosis present

## 2014-08-22 DIAGNOSIS — K449 Diaphragmatic hernia without obstruction or gangrene: Secondary | ICD-10-CM | POA: Diagnosis not present

## 2014-08-22 DIAGNOSIS — Z9049 Acquired absence of other specified parts of digestive tract: Secondary | ICD-10-CM | POA: Diagnosis not present

## 2014-08-22 MED ORDER — IOHEXOL 300 MG/ML  SOLN
100.0000 mL | Freq: Once | INTRAMUSCULAR | Status: AC | PRN
  Administered 2014-08-22: 100 mL via INTRAVENOUS

## 2014-08-26 ENCOUNTER — Other Ambulatory Visit: Payer: Self-pay | Admitting: Gastroenterology

## 2014-08-26 DIAGNOSIS — R1033 Periumbilical pain: Secondary | ICD-10-CM

## 2014-08-26 DIAGNOSIS — R9389 Abnormal findings on diagnostic imaging of other specified body structures: Secondary | ICD-10-CM

## 2014-09-04 ENCOUNTER — Ambulatory Visit (HOSPITAL_COMMUNITY): Admission: RE | Admit: 2014-09-04 | Payer: Medicare Other | Source: Ambulatory Visit

## 2014-09-08 ENCOUNTER — Other Ambulatory Visit: Payer: Self-pay | Admitting: Cardiology

## 2014-09-15 ENCOUNTER — Other Ambulatory Visit: Payer: Self-pay

## 2014-09-15 DIAGNOSIS — Z1231 Encounter for screening mammogram for malignant neoplasm of breast: Secondary | ICD-10-CM

## 2014-09-19 ENCOUNTER — Other Ambulatory Visit (HOSPITAL_COMMUNITY): Payer: Self-pay | Admitting: Gastroenterology

## 2014-09-19 ENCOUNTER — Ambulatory Visit (HOSPITAL_COMMUNITY)
Admission: RE | Admit: 2014-09-19 | Discharge: 2014-09-19 | Disposition: A | Discharge status: Home / Self Care | Payer: Medicare Other | Source: Ambulatory Visit | Attending: Gastroenterology | Admitting: Gastroenterology

## 2014-09-19 DIAGNOSIS — R938 Abnormal findings on diagnostic imaging of other specified body structures: Secondary | ICD-10-CM | POA: Diagnosis not present

## 2014-09-19 DIAGNOSIS — R932 Abnormal findings on diagnostic imaging of liver and biliary tract: Secondary | ICD-10-CM | POA: Insufficient documentation

## 2014-09-19 DIAGNOSIS — R1033 Periumbilical pain: Secondary | ICD-10-CM | POA: Diagnosis not present

## 2014-09-19 DIAGNOSIS — R9389 Abnormal findings on diagnostic imaging of other specified body structures: Secondary | ICD-10-CM

## 2014-09-19 LAB — POCT I-STAT CREATININE: Creatinine, Ser: 0.9 mg/dL (ref 0.50–1.10)

## 2014-09-19 MED ORDER — GADOBENATE DIMEGLUMINE 529 MG/ML IV SOLN
17.0000 mL | Freq: Once | INTRAVENOUS | Status: AC | PRN
  Administered 2014-09-19: 17 mL via INTRAVENOUS

## 2014-09-30 ENCOUNTER — Ambulatory Visit (INDEPENDENT_AMBULATORY_CARE_PROVIDER_SITE_OTHER): Payer: Medicare Other | Admitting: Internal Medicine

## 2014-09-30 ENCOUNTER — Telehealth: Payer: Self-pay | Admitting: Internal Medicine

## 2014-09-30 ENCOUNTER — Encounter: Payer: Self-pay | Admitting: Internal Medicine

## 2014-09-30 VITALS — BP 128/84 | HR 65 | Ht 60.0 in | Wt 181.0 lb

## 2014-09-30 DIAGNOSIS — R0600 Dyspnea, unspecified: Secondary | ICD-10-CM

## 2014-09-30 DIAGNOSIS — E785 Hyperlipidemia, unspecified: Secondary | ICD-10-CM

## 2014-09-30 DIAGNOSIS — R05 Cough: Secondary | ICD-10-CM | POA: Diagnosis not present

## 2014-09-30 DIAGNOSIS — R058 Other specified cough: Secondary | ICD-10-CM

## 2014-09-30 MED ORDER — ALBUTEROL SULFATE HFA 108 (90 BASE) MCG/ACT IN AERS: INHALATION_SPRAY | RESPIRATORY_TRACT | Status: DC

## 2014-09-30 MED ORDER — PREDNISONE 10 MG PO TABS: ORAL_TABLET | ORAL | Status: DC

## 2014-09-30 MED ORDER — HYDROCODONE-HOMATROPINE 5-1.5 MG/5ML PO SYRP: 5.0000 mL | ORAL_SOLUTION | Freq: Four times a day (QID) | ORAL | Status: DC | PRN

## 2014-09-30 NOTE — Assessment & Plan Note (Signed)
Denies limiting sob at ov despite "need for symbicort" ? All due to uacs (see sep a/p)

## 2014-09-30 NOTE — Assessment & Plan Note (Signed)
Muscle cramps ? Due to crestor > reduce dose by one half and if still cramping try off

## 2014-09-30 NOTE — Telephone Encounter (Signed)
Spoke with Cordelia Pen at front desk-she will add patient to MW schedule to be seen today while here in office.

## 2014-09-30 NOTE — Progress Notes (Signed)
Subjective:    Patient ID: [name] [name], female    DOB: [birth year], 79 y.o.   MRN: [id number]    Brief patient profile:  78 yobf  never smoker with CAD for FU of reactive airway disease - trigger ? GERD & mild AS / with completely nl lung function during flare 09/30/2014   10/29/09 - initial  This was of insidious onset - - She had an ER visit during a trip to [place] - echo nml LV fn, BNP 55, stress myoview nml, given cipro for 'bronchitis.'  CXR 08/10/09 shows bibasal interstitial prominence, lt volume loss with raised hemidiaphragm  CT angio chest 01/2008 was neg for PE, stable RUL 44m nodule as compared to 05/2005, no obvious fibrosis. CT sinuses 8/11 nml  She reports intermittent heartburn with protonix , had an EGD by Dr MCollene Maresin 11/10  PFTs in 03/2009 were essentially nml, no obstruction, nml lung volumes & diffusion.  [facility] Spirometry 6/11 shows nml lung function, RAST neg, Labs showed neg ANA, and low ESR. RA factor was elevated.  ER visit on 01/09/10 - absolute eos count 800 ,BNP 69, CXR - cardiomegaly, was on prednisone .  Sep, 2011 >> Referred to Rheum ([facility] Sills - favors OA rather than RA, CCP neg, CK was high 709 - muscle cramps, crestor stopped Stopped advair x 2 weeks - 'ran out', beta blockers stopped     01/10/14 [name] eval  Key is to keep reflux under control Stay on protonix daytime & pepcid at night Keep appt with Dr MCollene MaresPrednisone 10 mg tabs  Take 2 tabs daily with food x 7ds, then 1 tab daily with food x 7ds then STOP  # 40 Stay on symbicort Albuterol MDI 2 puffs upto 4 times daily as needed for wheezing Start singulair 10 mg at bedtime> never started it    03/31/2014 f/u ov/Vasil Juhasz re: ? Asthma  Chief Complaint  Patient presents with  . Follow-up    Pt states that her breathing is doing better since the last visit. She states does not have rescue inhaler, and has neb but has not needed since last visit.   maintaining protonix  40 mg ac and pepcid at bedtime but easily confused with names of meds and instructions rec Continue symbicort 160 Take 2 puffs first thing in am and then another 2 puffs about 12 hours later.  Work on inhaler technique:    Only use your albuterol as a rescue medication  Stop cozar and take diovan (valsartan) 160 mg one daily          04/28/2014 f/u ov/[name] [name] re: cough/ throat clearing/ ? Pseudoasthma Chief Complaint  Patient presents with  . Follow-up    Pt states that her breathing seems to be doing better since her last visit. No wheezing, cough or need for rescue inhaler or neb.   symbicort 160 2bid and no need rescue at all, Not limited by breathing from desired activities  rec When you finish the symbicort 160 switch to the 80 Take 2 puffs first thing in am and then another 2 puffs about 12 hours later.   06/09/2014 f/u ov/Niyam Bisping re: asthma vs uacs no longer on any inhalers at all x one month and doing well  Chief Complaint  Patient presents with  . Follow-up    Pt stats that her breathing is doing great. She has not had to use rescue inhaler since the last visit. She states since  she started on valsartan end of Oct 2015 she has noticed joint pain and loss of appetite.   Not limited by breathing from desired activities   rec F/u prn    09/30/2014 acute ov/ re: cough/ out of all inhalers x weeks/ took 2 pred pill one week prior to OV  Left over  Chief Complaint  Patient presents with  . Acute Visit    Pt c/o increased cough for the past wk. She states cough is non prod, feels like mucus gets stuck in her throat. She has not had to use proair.    again easily confused with meds / not really clear what she's taking  Not limited by breathing from desired activities  But by R back/hip pain    No obvious patterns in day to day or daytime variabilty or assoc excess/ purulent sputum or  cp or chest tightness, subjective wheeze overt sinus or hb symptoms. No unusual exp hx or h/o  childhood pna/ asthma or knowledge of premature birth.  Sleeping ok without nocturnal  or early am exacerbation  of respiratory  c/o's or need for noct saba. Also denies any obvious fluctuation of symptoms with weather or environmental changes or other aggravating or alleviating factors except as outlined above   Current Medications, Allergies, Complete Past Medical History, Past Surgical History, Family History, and Social History were reviewed in [facility] electronic medical record.  ROS  The following are not active complaints unless bolded sore throat, dysphagia, dental problems, itching, sneezing,  nasal congestion or excess/ purulent secretions, ear ache,   fever, chills, sweats, unintended wt loss, pleuritic or exertional cp, hemoptysis,  orthopnea pnd or leg swelling, presyncope, palpitations, heartburn, abdominal pain, anorexia, nausea, vomiting, diarrhea  or change in bowel or urinary habits, change in stools or urine, dysuria,hematuria,  rash, arthralgias, visual complaints, headache, numbness weakness or ataxia or problems with walking or coordination,  change in mood/affect or memory.                     Objective:  Physical Exam   04/28/2014      183 >  06/09/13 178 > 09/30/2014  181 Wt Readings from Last 3 Encounters:  03/31/14 187 lb (84.823 kg)  02/17/14 187 lb (84.823 kg)  01/10/14 188 lb 6.4 oz (85.458 kg)      amb bf nad /vital signs reviewed        HEENT: nl dentition, turbinates, and orophanx. Nl external ear canals without cough reflex   NECK :  without JVD/Nodes/TM/ nl carotid upstrokes bilaterally   LUNGS: no acc muscle use, clear to A and P bilaterally without cough on insp or exp maneuvers   CV:  RRR  no s3,  II/IV SEM  no increase in P2, no edema   ABD:  soft and nontender with nl excursion in the supine position. No bruits or organomegaly, bowel sounds nl  MS:  warm without deformities, calf tenderness, cyanosis or clubbing                         Assessment & Plan:    

## 2014-09-30 NOTE — Telephone Encounter (Signed)
Will see her first with spirometry

## 2014-09-30 NOTE — Patient Instructions (Addendum)
Pantoprazole (protonix) 40 mg   Take 30-60 min before first meal of the day and(famotidine)  Pepcid ac 20 mg one bedtime until return to office - this is the best way to tell whether stomach acid is contributing to your problem.    Prednisone 10 mg take  4 each am x 2 days,   2 each am x 2 days,  1 each am x 2 days and stop   For drainage take chlortrimeton (chlorpheniramine) 4 mg every 4 hours available over the counter (may cause drowsiness)   GERD (REFLUX)  is an extremely common cause of respiratory symptoms just like yours , many times with no obvious heartburn at all.    It can be treated with medication, but also with lifestyle changes including avoidance of late meals, excessive alcohol, smoking cessation, and avoid fatty foods, chocolate, peppermint, colas, red wine, and acidic juices such as orange juice.  NO MINT OR MENTHOL PRODUCTS SO NO COUGH DROPS  USE SUGARLESS CANDY INSTEAD (Jolley ranchers or Stover's or Life Savers) or even ice chips will also do - the key is to swallow to prevent all throat clearing. NO OIL BASED VITAMINS - use powdered substitutes.   Try off  coQ 10 x 2 weeks and just take Crestor one half daily  and if cramps still bad stop crestor too   If still cough take hydromet 1-2 tsp every 4 hours as needed  If breathing problems > proair up to 2 pffs every 4 hours as needed only if stop the coughing first   See  Me in  2 weeks with all your medications, even over the counter meds, separated in two separate bags, the ones you take no matter what vs the ones you stop once you feel better and take only as needed when you feel you need them Late add:  Do allergy profile/ repeat sinus ct next and consider adding neurontin once we've achieved med rec Repeat bmet also .

## 2014-09-30 NOTE — Assessment & Plan Note (Addendum)
-   CT head 04/20/14 Diffuse mucoperiosteal thickening and partial opacification of the visualized ethmoid air cells. Small air-fluid level in the right sphenoid air cell. Findings suggest inflammatory paranasal sinus disease.   - spirometry supra nl 09/30/2014 during flare of cough    I had an extended discussion with the patient reviewing all relevant studies completed to date and  lasting 15 to 20 minutes of a 25 minute visit on the following ongoing concerns:  1) no evidence at all to support asthma based on today's exam  2) most likely this is  Classic Upper airway cough syndrome, so named because it's frequently impossible to sort out how much is  CR/sinusitis with freq throat clearing (which can be related to primary GERD)   vs  causing  secondary (" extra esophageal")  GERD from wide swings in gastric pressure that occur with throat clearing, often  promoting self use of mint and menthol lozenges that reduce the lower esophageal sphincter tone and exacerbate the problem further in a cyclical fashion.   These are the same pts (now being labeled as having "irritable larynx syndrome" by some cough centers) who not infrequently have a history of having failed to tolerate ace inhibitors,  dry powder inhalers or biphosphonates or report having atypical reflux symptoms that don't respond to standard doses of PPI , and are easily confused as having aecopd or asthma flares by even experienced allergists/ pulmonologists.   3) Unlike when you get a prescription for eyeglasses, it's not possible to always walk out of this or any medical office with a perfect prescription that is immediately effective  based on any test that we offer here.    On the contrary, it may take several weeks for the full impact of changes recommened today - hopefully you will respond well.  If not, then we'll adjust your medication on your next visit accordingly, knowing more then than we can possibly know now.  4) for now  work on eliminate cyclical cough with hydromet, treat only with 6 days of pred, then return for a trust but verify ov in 2 weeks  5) Each maintenance medication was reviewed in detail including most importantly the difference between maintenance and as needed and under what circumstances the prns are to be used.  Please see instructions for details which were reviewed in writing and the patient given a copy.

## 2014-09-30 NOTE — Telephone Encounter (Signed)
Spoke with patient-she is aware that last OV shows she was taken off all inhalers as she was doing good and told to follow up for pulmonary as needed. Pt stated she has went back on the Symbicort inhaler which is too expensive-unable to use a coupon due to having gvt type insurance. MW please advise if okay to give a sample or suggest what to change patient to.   Pt last seen 06-2014. Thanks.    Pt is in [place] now.

## 2014-10-16 ENCOUNTER — Ambulatory Visit (INDEPENDENT_AMBULATORY_CARE_PROVIDER_SITE_OTHER): Payer: Medicare Other | Admitting: Internal Medicine

## 2014-10-16 ENCOUNTER — Ambulatory Visit (INDEPENDENT_AMBULATORY_CARE_PROVIDER_SITE_OTHER)
Admission: RE | Admit: 2014-10-16 | Discharge: 2014-10-16 | Disposition: A | Discharge status: Home / Self Care | Payer: Medicare Other | Source: Ambulatory Visit | Attending: Internal Medicine | Admitting: Internal Medicine

## 2014-10-16 ENCOUNTER — Other Ambulatory Visit (INDEPENDENT_AMBULATORY_CARE_PROVIDER_SITE_OTHER): Payer: Medicare Other

## 2014-10-16 VITALS — BP 130/80 | HR 65 | Ht 60.0 in | Wt 181.0 lb

## 2014-10-16 DIAGNOSIS — E785 Hyperlipidemia, unspecified: Secondary | ICD-10-CM | POA: Diagnosis not present

## 2014-10-16 DIAGNOSIS — R05 Cough: Secondary | ICD-10-CM

## 2014-10-16 DIAGNOSIS — J45909 Unspecified asthma, uncomplicated: Secondary | ICD-10-CM | POA: Diagnosis not present

## 2014-10-16 DIAGNOSIS — R058 Other specified cough: Secondary | ICD-10-CM

## 2014-10-16 LAB — BASIC METABOLIC PANEL
BUN: 14 mg/dL (ref 6–23)
CO2: 29 mEq/L (ref 19–32)
Calcium: 9.1 mg/dL (ref 8.4–10.5)
Chloride: 110 mEq/L (ref 96–112)
Creatinine, Ser: 0.79 mg/dL (ref 0.40–1.20)
GFR: 90.76 mL/min (ref >60.00)
Glucose, Bld: 113 mg/dL — ABNORMAL HIGH (ref 70–99)
Potassium: 3.7 mEq/L (ref 3.5–5.1)
Sodium: 142 mEq/L (ref 135–145)

## 2014-10-16 LAB — CBC WITH DIFFERENTIAL/PLATELET
Basophils Absolute: 0 10*3/uL (ref 0.0–0.1)
Basophils Relative: 0.5 % (ref 0.0–3.0)
Eosinophils Absolute: 0.3 10*3/uL (ref 0.0–0.7)
Eosinophils Relative: 3.6 % (ref 0.0–5.0)
HCT: 37 % (ref 36.0–46.0)
Hemoglobin: 12.8 g/dL (ref 12.0–15.0)
Lymphocytes Relative: 31.9 % (ref 12.0–46.0)
Lymphs Abs: 2.3 10*3/uL (ref 0.7–4.0)
MCHC: 34.5 g/dL (ref 30.0–36.0)
MCV: 88 fl (ref 78.0–100.0)
Monocytes Absolute: 0.6 10*3/uL (ref 0.1–1.0)
Monocytes Relative: 8.9 % (ref 3.0–12.0)
Neutro Abs: 4 10*3/uL (ref 1.4–7.7)
Neutrophils Relative %: 55.1 % (ref 43.0–77.0)
Platelets: 155 10*3/uL (ref 150.0–400.0)
RBC: 4.21 Mil/uL (ref 3.87–5.11)
RDW: 12.7 % (ref 11.5–15.5)
WBC: 7.2 10*3/uL (ref 4.0–10.5)

## 2014-10-16 NOTE — Patient Instructions (Addendum)
Pantoprazole (protonix) 40 mg   Take 30-60 min before first meal of the day and(famotidine)  Pepcid ac 20 mg one bedtime     For drainage take chlortrimeton (chlorpheniramine) 4 mg every 4 hours available over the counter (may cause drowsiness)   Stop  Crestor and co Q10 for now   If still cough take hydromet 1-2 tsp every 4 hours as needed  If breathing problems > proair up to 2 pffs every 4 hours as needed only if stop the coughing first   See  Dr Eloise Harman with all your active medicines with you   Please remember to go to the lab  department downstairs for your tests - we will call you with the results when they are available.    Pulmonary follow up is as needed

## 2014-10-16 NOTE — Progress Notes (Signed)
Subjective:    Patient ID: [name] [name], female    DOB: [birth year], 79 y.o.   MRN: [id number]    Brief patient profile:  51 yobf  never smoker with CAD for FU of reactive airway disease - trigger ? GERD & mild AS / with completely nl lung function during flare 09/30/2014   10/29/09 - initial  This was of insidious onset - - She had an ER visit during a trip to [place] - echo nml LV fn, BNP 55, stress myoview nml, given cipro for 'bronchitis.'  CXR 08/10/09 shows bibasal interstitial prominence, lt volume loss with raised hemidiaphragm  CT angio chest 01/2008 was neg for PE, stable RUL 62m nodule as compared to 05/2005, no obvious fibrosis. CT sinuses 8/11 nml  She reports intermittent heartburn with protonix , had an EGD by Dr MCollene Maresin 11/10  PFTs in 03/2009 were essentially nml, no obstruction, nml lung volumes & diffusion.  [facility] Spirometry 6/11 shows nml lung function, RAST neg, Labs showed neg ANA, and low ESR. RA factor was elevated.  ER visit on 01/09/10 - absolute eos count 800 ,BNP 69, CXR - cardiomegaly, was on prednisone .  Sep, 2011 >> Referred to Rheum ([place] - favors OA rather than RA, CCP neg, CK was high 709 - muscle cramps, crestor stopped Stopped advair x 2 weeks - 'ran out', beta blockers stopped     01/10/14 [name] [name] eval  Key is to keep reflux under control Stay on protonix daytime & pepcid at night Keep appt with Dr MCollene MaresPrednisone 10 mg tabs  Take 2 tabs daily with food x 7ds, then 1 tab daily with food x 7ds then STOP  # 40 Stay on symbicort Albuterol MDI 2 puffs upto 4 times daily as needed for wheezing Start singulair 10 mg at bedtime> never started it    03/31/2014 f/u ov/[name] [name] re: ? Asthma  Chief Complaint  Patient presents with  . Follow-up    Pt states that her breathing is doing better since the last visit. She states does not have rescue inhaler, and has neb but has not needed since last visit.   maintaining protonix  40 mg ac and pepcid at bedtime but easily confused with names of meds and instructions rec Continue symbicort 160 Take 2 puffs first thing in am and then another 2 puffs about 12 hours later.  Work on inhaler technique:    Only use your albuterol as a rescue medication  Stop cozar and take diovan (valsartan) 160 mg one daily          04/28/2014 f/u ov/[name] [name] re: cough/ throat clearing/ ? Pseudoasthma Chief Complaint  Patient presents with  . Follow-up    Pt states that her breathing seems to be doing better since her last visit. No wheezing, cough or need for rescue inhaler or neb.   symbicort 160 2bid and no need rescue at all, Not limited by breathing from desired activities  rec When you finish the symbicort 160 switch to the 80 Take 2 puffs first thing in am and then another 2 puffs about 12 hours later.   06/09/2014 f/u ov/[name] [name] re: asthma vs uacs no longer on any inhalers at all x one month and doing well  Chief Complaint  Patient presents with  . Follow-up    Pt stats that her breathing is doing great. She has not had to use rescue inhaler since the last visit. She states since  she started on valsartan end of Oct 2015 she has noticed joint pain and loss of appetite.   Not limited by breathing from desired activities   rec F/u prn    09/30/2014 acute ov/[name] [name] re: cough/ out of all inhalers x weeks/ took 2 pred pill one week prior to OV  Left over  Chief Complaint  Patient presents with  . Acute Visit    Pt c/o increased cough for the past wk. She states cough is non prod, feels like mucus gets stuck in her throat. She has not had to use proair.   again easily confused with meds / not really clear what she's taking Not limited by breathing from desired activities  But by R back/hip pain  rec Pantoprazole (protonix) 40 mg   Take 30-60 min before first meal of the day and(famotidine)  Pepcid ac 20 mg one bedtime until return to office - this is the best way to tell whether stomach  acid is contributing to your problem.   Prednisone 10 mg take  4 each am x 2 days,   2 each am x 2 days,  1 each am x 2 days and stop  For drainage take chlortrimeton (chlorpheniramine) 4 mg every 4 hours available over the counter (may cause drowsiness)  GERD diet  Try off  coQ 10 x 2 weeks and just take Crestor one half daily  and if cramps still bad stop crestor too  If still cough take hydromet 1-2 tsp every 4 hours as needed If breathing problems > proair up to 2 pffs every 4 hours as needed only if stop the coughing first  See  Me in  2 weeks with all your medications, even over the counter meds, separated in two separate bags, the ones you take no matter what vs the ones you stop once you feel better and take only as needed when you feel you need them Late add:  Do allergy profile/ repeat sinus ct next and consider adding neurontin once we've achieved med rec Repeat bmet also .        10/16/2014 f/u ov/[name] [name] re: chronic cough improved off all inhalers  Chief Complaint  Patient presents with  . Follow-up    Pt states cough has resolved. She has been sneezing alot- started day after last visit. She also c/o rhinitis and HA.      Did not follow any of the instructions except for GERD but much better with just diet  rec until started having nasal congestion/ runny nose/ day of ov but still "not coughing but feel like something stuck in my throat"    No obvious patterns in day to day or daytime variabilty or assoc excess/ purulent sputum or  cp or chest tightness, subjective wheeze overt    hb symptoms. No unusual exp hx or h/o childhood pna/ asthma or knowledge of premature birth.  Sleeping ok without nocturnal  or early am exacerbation  of respiratory  c/o's or need for noct saba. Also denies any obvious fluctuation of symptoms with weather or environmental changes or other aggravating or alleviating factors except as outlined above   Current Medications, Allergies, Complete Past Medical  History, Past Surgical History, Family History, and Social History were reviewed in [employer] record.  ROS  The following are not active complaints unless bolded sore throat, dysphagia, dental problems, itching, sneezing,  nasal congestion or excess/ purulent secretions, ear ache,   fever, chills, sweats, unintended wt loss, pleuritic  or exertional cp, hemoptysis,  orthopnea pnd or leg swelling, presyncope, palpitations, heartburn, abdominal pain, anorexia, nausea, vomiting, diarrhea  or change in bowel or urinary habits, change in stools or urine, dysuria,hematuria,  rash, arthralgias/ cramps/aches in leg muscles bilaterally , visual complaints, headache, numbness weakness or ataxia or problems with walking or coordination,  change in mood/affect or memory.                     Objective:  Physical Exam   04/28/2014      183 >  06/09/13 178 > 09/30/2014  181> 10/16/2014   182  Wt Readings from Last 3 Encounters:  03/31/14 187 lb (84.823 kg)  02/17/14 187 lb (84.823 kg)  01/10/14 188 lb 6.4 oz (85.458 kg)     Vital signs reviewed  amb bf nad /freq throat clearing and mild pseudowheeeze       HEENT: nl dentition, turbinates, and orophanx. Nl external ear canals without cough reflex   NECK :  without JVD/Nodes/TM/ nl carotid upstrokes bilaterally   LUNGS: no acc muscle use, clear to A and P bilaterally without cough on insp or exp maneuvers   CV:  RRR  no s3,  II/IV SEM  no increase in P2, no edema   ABD:  soft and nontender with nl excursion in the supine position. No bruits or organomegaly, bowel sounds nl  MS:  warm without deformities, calf tenderness, cyanosis or clubbing    I personally reviewed images and agree with radiology impression as follows:  CXR:  10/16/14  Midline trachea. Mild cardiomegaly. Tortuous thoracic aorta. No pleural effusion or pneumothorax. No congestive failure. Clear lungs.               Labs ordered/ reviewed:     Lab 10/16/14 1600  NA 142  K 3.7  CL 110  CO2 29  BUN 14  CREATININE 0.79  GLUCOSE 113*     Lab 10/16/14 1600  HGB 12.8  HCT 37.0  WBC 7.2  PLT 155.0     Lab Results  Component Value Date   TSH 2.18 06/09/2014              Assessment & Plan:   Outpatient Encounter Prescriptions as of 10/16/2014  Medication Sig  . acetaminophen (TYLENOL) 500 MG tablet Take 500 mg by mouth every 6 (six) hours as needed.  . famotidine (PEPCID) 20 MG tablet TAKE 1 TABLET BY MOUTH AT BEDTIME  . nitroGLYCERIN (NITROSTAT) 0.4 MG SL tablet Place 0.4 mg under the tongue every 5 (five) minutes as needed for chest pain.   . pantoprazole (PROTONIX) 40 MG tablet 1 every am 30 min before first meal  . valsartan (DIOVAN) 160 MG tablet Take 1 tablet (160 mg total) by mouth daily.  . [DISCONTINUED] CRESTOR 20 MG tablet TAKE 1 TABLET (20 MG TOTAL) BY MOUTH DAILY. (Patient taking differently: 1/2 tablet daily)  . [DISCONTINUED] albuterol (PROAIR HFA) 108 (90 BASE) MCG/ACT inhaler 2 puffs every 4 hours as needed only  if your can't catch your breath  . [DISCONTINUED] clopidogrel (PLAVIX) 75 MG tablet TAKE 1 TABLET (75 MG TOTAL) BY MOUTH DAILY.  . [DISCONTINUED] HYDROcodone-homatropine (HYCODAN) 5-1.5 MG/5ML syrup Take 5 mLs by mouth every 6 (six) hours as needed for cough. (Patient not taking: Reported on 10/16/2014)  . [DISCONTINUED] Polyethyl Glycol-Propyl Glycol (SYSTANE OP) Place 1 drop into both eyes daily as needed (dry eyes).  . [DISCONTINUED] predniSONE (DELTASONE) 10 MG tablet Take  4 each am x 2 days,   2 each am x 2 days,  1 each am x 2 days and stop  . [DISCONTINUED] PROAIR HFA 108 (90 BASE) MCG/ACT inhaler as needed.   No facility-administered encounter medications on file as of 10/16/2014.

## 2014-10-17 ENCOUNTER — Encounter: Payer: Self-pay | Admitting: Internal Medicine

## 2014-10-17 LAB — ALLERGY FULL PROFILE
Allergen, D pternoyssinus,d7: <0.1 kU/L
Allergen,Goose feathers, e70: <0.1 kU/L
Alternaria Alternata: <0.1 kU/L
Aspergillus fumigatus, m3: <0.1 kU/L
Bahia Grass: <0.1 kU/L
Bermuda Grass: <0.1 kU/L
Box Elder IgE: <0.1 kU/L
Candida Albicans: <0.1 kU/L
Cat Dander: <0.1 kU/L
Common Ragweed: <0.1 kU/L
Curvularia lunata: <0.1 kU/L
D. farinae: <0.1 kU/L
Dog Dander: <0.1 kU/L
Elm IgE: <0.1 kU/L
Fescue: <0.1 kU/L
G005 Rye, Perennial: <0.1 kU/L
G009 Red Top: <0.1 kU/L
Goldenrod: <0.1 kU/L
Helminthosporium halodes: <0.1 kU/L
House Dust Hollister: <0.1 kU/L
IgE (Immunoglobulin E), Serum: 18 kU/L (ref <115)
Lamb's Quarters: <0.1 kU/L
Oak: <0.1 kU/L
Plantain: <0.1 kU/L
Stemphylium Botryosum: <0.1 kU/L
Sycamore Tree: <0.1 kU/L
Timothy Grass: <0.1 kU/L

## 2014-10-17 NOTE — Assessment & Plan Note (Addendum)
-   CT head 04/20/14 Diffuse mucoperiosteal thickening and partial opacification of the visualized ethmoid air cells. Small air-fluid level in the right sphenoid air cell. Findings suggest inflammatory paranasal sinus disease.   - spirometry wnl 09/30/2014 during flare of cough  - allergy profile 10/16/14 >  Eos 0.3, IgE  18 with neg RAST    Classic Upper airway cough syndrome, so named because it's frequently impossible to sort out how much is  CR/sinusitis with freq throat clearing (which can be related to primary GERD)   vs  causing  secondary (" extra esophageal")  GERD from wide swings in gastric pressure that occur with throat clearing, often  promoting self use of mint and menthol lozenges that reduce the lower esophageal sphincter tone and exacerbate the problem further in a cyclical fashion.   These are the same pts (now being labeled as having "irritable larynx syndrome" by some cough centers) who not infrequently have a history of having failed to tolerate ace inhibitors,  dry powder inhalers or biphosphonates or report having atypical reflux symptoms that don't respond to standard doses of PPI , and are easily confused as having aecopd or asthma flares by even experienced allergists/ pulmonologists.   I had an extended final summary discussion with the patient reviewing all relevant studies completed to date and  lasting 15 to 20 minutes of a 25 minute visit on the following issues:    1) she does not appear to have asthma, cough variant or otherwise   2) her UACS may be related to pnds / gerd/ or habitual throat clearing but no need to proceed further until she follows the recs we already made which were repeated line for line emphasizing the use of 1st gen h1 on trial basis to see if that helps  3) The standardized cough guidelines published in Chest by [place] in 2006 are still the best available and consist of a multiple step process (up to 12!) , not a single office visit,  and  are intended  to address this problem logically,  with an alogrithm dependent on response to empiric treatment at  each progressive step  to determine a specific diagnosis with  minimal addtional testing needed. Therefore if adherence is an issue or can't be accurately verified,  it's very unlikely the standard evaluation and treatment will be successful here.    Furthermore, response to therapy (other than acute cough suppression, which should only be used short term with avoidance of narcotic containing cough syrups if possible), can be a gradual process for which the patient may perceive immediate benefit.  Unlike going to an eye doctor where the best perscription is almost always the first one and is immediately effective, this is almost never the case in the management of chronic cough syndromes. Therefore the patient needs to commit up front to consistently adhere to recommendations  for up to 6 weeks of therapy directed at the likely underlying problem(s) before the response can be reasonably evaluated.   She has not been willing to comply with the type of medication reconcilation required to accomplish this task and refuses to return to our NP for this purpose so we have nothing to offer in this clinic > consider referral to Dr Barnie [facility]

## 2014-10-17 NOTE — Progress Notes (Signed)
Quick Note:  Spoke with pt and notified of results per Dr. Wert. Pt verbalized understanding and denied any questions.  ______ 

## 2014-10-17 NOTE — Assessment & Plan Note (Signed)
PFTs in 03/2009 were essentially nml, no obstruction, nml lung volumes & diffusion - improved symptoms p stopped losartan 04/01/14 - 04/28/2014 p extensive coaching HFA effectiveness =    90%  - symptoms improved off all rx 10/16/14 so rec prn saba only

## 2014-10-17 NOTE — Assessment & Plan Note (Signed)
Her muscle aches /cramps may be crestor related, rec trial off x 2 weeks and f/u with Dr Eloise Harman so see what difference this made and proceed with further w/u at his discretion.

## 2014-10-20 ENCOUNTER — Ambulatory Visit: Payer: Medicare Other

## 2014-10-20 NOTE — Progress Notes (Signed)
Quick Note:  Spoke with pt and notified of results per Dr. Wert. Pt verbalized understanding and denied any questions.  ______ 

## 2014-10-22 ENCOUNTER — Ambulatory Visit
Admission: RE | Admit: 2014-10-22 | Discharge: 2014-10-22 | Disposition: A | Discharge status: Home / Self Care | Payer: Medicare Other | Source: Ambulatory Visit

## 2014-10-22 DIAGNOSIS — Z1231 Encounter for screening mammogram for malignant neoplasm of breast: Secondary | ICD-10-CM

## 2014-11-27 ENCOUNTER — Ambulatory Visit (INDEPENDENT_AMBULATORY_CARE_PROVIDER_SITE_OTHER): Payer: Medicare Other | Admitting: Neurology

## 2014-11-27 ENCOUNTER — Encounter: Payer: Self-pay | Admitting: Neurology

## 2014-11-27 VITALS — BP 148/80 | HR 84 | Resp 20 | Ht 62.99 in | Wt 186.0 lb

## 2014-11-27 DIAGNOSIS — G4733 Obstructive sleep apnea (adult) (pediatric): Secondary | ICD-10-CM

## 2014-11-27 DIAGNOSIS — J4540 Moderate persistent asthma, uncomplicated: Secondary | ICD-10-CM

## 2014-11-27 NOTE — Progress Notes (Signed)
[name]: [name] [name] DOB: February 28, 1936  REASON FOR VISIT: follow up HISTORY FROM: [name]  HISTORY OF PRESENT ILLNESS: [name] [name] is a 79 year old female with history of obstructive sleep Apnea.. [name] [name] returns today for a 90 day compliance download. [name] [name] brought her machine with her today and [name] reports shows an AHI of 4.9 at  9 cm of water, uses her machine for 3:14  hours a night, with _50%compliance. [name] [name] only used [name] machine for 3 days. Her Epworth score is 22 points was previously 7 points. Her fatigue severity score is 43 was previously 9.  [name] [name] reports that [name] [name] has not been using [name] CPAP like [name] [name] should because [name] nose piece bothers her. However Dr. Philip Aspen told her [name] [name] needs to use it every night therefore [name] [name] started last week trying to use it again. [name] [name] goes to bed around 8:00 pm and arises at 4:00 am. [name] [name] denies having trouble falling asleep or staying a sleep. States that [name] [name] [name] gets up about 3 times a night to urinate. Overall [name] feels that CPAP has improved her sleepiness and fatigue. [name] [name] does states that [name] [name] has bronchitis due to her asthma.  Since [name] last visit [name] [name] has had no new medical issues  HISTORY 11/12/12 (CD): is a 79 y.o. female here as a referral from [place], for a sleep consult. This [name] had been seen over 3 years ago and had been diagnosed with sleep apnea , but did not follow up with CPAP .  And 2011 [name] [name] endorsed [name] Epworth sleepiness score at 7 and [name] Beck Depression Inventory at 4 points at [name] time [name] [name] had a body mass index of 34.6 adnexa conference of 15 inches and a blood pressure of 142/85 mm.  [name] [name] had an apnea hypotony index of 23.8 and an oxygen nadir of 81% but only for very brief period of time. [name] apnea was noted to be accentuated during REM sleep. [name] [name] also had bradytachycardia heart rate varied between 44 beats a minute to 74 beats per minute. Based on this [name] [name] was asked to return for CPAP titration  11/12/2009.  [name] [name] reports today that [name] [name] still thinks [name] [name] sleeps actually rather well, [name] [name] does have frequent or recurrent bouts of bronchitis and has some PRN medication for its treatment.  [name] [name] reports that her bedtime is around 10:30 and 11 PM that [name] [name] falls asleep quickly. [name] [name] has a 5 to 6 Times nocturia at night, beginning just a couple of months ago. [name] [name] states normally [name] [name] doesn't hear her husband going to [name] bathroom while getting up , [name] [name] has a deep sleep. Reports that [name] [name] snores since he is quickly asleep too he has not been witnessing any apneas or could not report such.  [name] [name] rises at 5:30 AM and [name] [name] awakens spontaneously does not need [name] alarm. [name] [name] will to [name] gym in [name] morning 3 times a week Monday ,Tuesday, Wednesday.  Couple of times [name] [name] had heartburn or some shortness of breath doing workout, but that resolved quickly and has not been [name] case again for at least 4weeks now.  He has no history of shift work  [name] [name] has a daughter with a OSA related sleep disorder, her sister uses an oxygen concentrator for COPD.  [name] [name] also states that for a while and her past [name] [name] was insomnic and treated with Ambien ,which was not becoming. He discontinued that medication and does not need any sleep aids [name] [name] insists.  [name] [name] endorsed [name] Epworth sleepiness score of only 7 points, FSS at 9 points, GDS at 2 points, fall risk is 6 points, 5 for meds and 1 for age . No falls recently .  [name] [name] does not consider asleep poor of poor quality, [name] [name] denies any excessive daytime sleepiness and does given that [name] results of a new sleep study may be similar to [name] one from 3 years ago it is questionable air [name] [name] would be compliant with treatment. [name] [name] has neither heart disease nor strokes, [name] [name] reports. When I asked her about dr Claris Gladden evaluation for advanced CAD , [name] [name] relented but [name] [name] was unable to correlate CAD to "heart disease".  [name] has not been using her CPAP because [name] [name] does not like [name]  nose piece. [name] [name] has agreed to start using [name] machine nightly, if [name] nose piece continues to bother her we will have her fitted for a new mask. [name] verbalized understanding and states that [name] [name] will call us if [name] mask needs to be adjusted.    Interval history from 11-27-14,  was last seen by nurse practitioner Ward Givens in September 2015. [name] [name] is had undergone to sleep studies at our last 1 on 11/19/2012 when [name] [name] was diagnosed with an AHI of 19.1 and an RDI of 26.1 REM AHI was 30.6 supine AHI was 31 oxygen nadir was 82% was 37 minutes of desaturation. [name] [name] return for CPAP titration on 6-20 2-14 and was titrated to 9 cm water pressure with a ResMed air-fluid P 10 in large size. [name] [name] states today that [name] [name] has been able to sleep longer but [name] [name] still has multiple bathroom breaks at night. [name] [name] states that [name] [name] woke up one night and [name] CPAP machines water compartment had leaked [name] [name] was went all over [name] [name] has not used CPAP symptoms. There are no compliance data to review a, fatigue severity scale is 10 Epworth sleepiness score is 1. geriatric depression score is 0 points Dr. Philip Aspen, her primary care physician has convinced her that [name] [name] should try CPAP again he [facility]. He has no documented compliance data and apparently [facility]  has not been able to get her back on CPAP. [name] [name] is a Medicare [name] and has to requalify for CPAP. [name] [name] has not used CPAP it in over a year. [name] [name] reports that her original CPAP machine was provided by her daughter and not covered by Medicare [name] [name] also covered [name] equipment or replacement parts and supplies by herself. For this reason [name] [name] should not be held as noncompliant.   [name] [name]'s bedtimes have changed [name] [name] goes to bed around 10 to 10:30, [name] [name] will watch TV for about 30 minutes, [name] [name] will watches TV in [name] bedroom. Sleep latency is usually short-lived 30 minutes. Currently her  nocturia is much improved maybe be due to a change in her antihypertension medication. [name] [name] only goes once at night now. That this may be 5 AMCarmen Sahasra [name], M.D. her husband wakes her usually and they have a cup of coffee at 4:30 or 435. Go back to bed for an hour or 2 after that. He does not take any naps in [name] afternoon, [name] [name] usually keeps stimulated and physically active during [name] day and is not falling asleep on [name] sofa before transferring to [name] bedroom. [name] [name] sleeps on her side. Her bedroom is cool but [name] TV is running all night ! Her husband , who is very deaf,  insists on [name] TV .       REVIEW OF SYSTEMS: Full 14 system review of systems performed and notable only for:  Constitutional: obesity,  SOB, no plapitations Eyes: eye itching. Eye pain, blurred vision Ear/Nose/Throat:  Retrognathia, nasal congestion Skin: no rash and no edema  Cardiovascular: no palpitation  Respiratory: cough, wheezing, shortness of breath Genitourinary: frequency of urination, nocturia  3-5 times or more- improved over [name] last 6 month-  After medication changed. .  Musculoskeletal: back pain, muscle cramps Sleep: untreated OSA,  non compliant with CPAP .    ALLERGIES: Allergies  Allergen Reactions  . Clarithromycin Other (See Comments)    unknown  . Doxycycline Nausea Only  . Levaquin [Levofloxacin In D5w]     Rash, HA  . Pravastatin     REACTION: itching  . Penicillins Itching and Rash    HOME MEDICATIONS: Outpatient Prescriptions Prior to Visit  Medication Sig Dispense Refill  . acetaminophen (TYLENOL) 500 MG tablet Take 500 mg by mouth every 6 (six) hours as needed.    . famotidine (PEPCID) 20 MG tablet TAKE 1 TABLET BY MOUTH AT BEDTIME 30 tablet 11  . nitroGLYCERIN (NITROSTAT) 0.4 MG SL tablet Place 0.4 mg under [name] tongue every 5 (five) minutes as needed for chest pain.     . pantoprazole (PROTONIX) 40 MG tablet 1 every am 30 min before first meal    . valsartan (DIOVAN) 160 MG tablet  Take 1 tablet (160 mg total) by mouth daily. 30 tablet 11   No facility-administered medications prior to visit.    PAST MEDICAL HISTORY: Past Medical History  Diagnosis Date  . CAD (coronary artery disease)     L heart cath done 9/09 - exertional chest pain. EF 60%, RCA totally occluded w/L-to-R collateral, distal main had a 20% lesion. small ramus with a 60-70% stenosis, mid LAD had serial 60% stenosis with a distal subtotalled LAD. 50% 1st diagonal and 50% 1st OM stenosis. poor distal targets for bypass surgery/no good interventional target - medical managment planned. Myoview in [place] in 3/11 -nml  . HTN (hypertension)   . PUD (peptic ulcer disease)   . HBV (hepatitis B virus) infection   . HCV (hepatitis C virus)   . Chronic cough     hx; ACEI stoped, possible contribution from GERD  . PVC (premature ventricular contraction)     hx  . PAC (premature atrial contraction)     hx  . GERD (gastroesophageal reflux disease)   . IBS (irritable bowel syndrome)   . Depression   . HLD (hyperlipidemia)     unable to tolaerate crestor due to myalgias and elevated CK. thinks pravastatin casued a rash.   . Obesity   . Pancreatitis     hx  . Osteoporosis   . Osteoarthritis     knee; s/p TKR  . Dyspnea     EF 55-60%, no regional wall motion abnormalities, aortic sclerosis. echo (3/11) in [place]: EF 32%, grade 1 diastolic dysfunction, normal RV, no regional wall motion abntml. valves ok. PFTs (10/10): FVC 103%, ratio 74%. mild obstruction. CT chest (6/11) no evidence for interstitial lung dz. possible asthma/upper airway cough sydrome. aspirin & beta blockers- potential causes for bronchospasm.   . Arthritis   . Sleep apnea     diagnosed in 2011 , did not want CPAP -     PAST SURGICAL HISTORY: Past Surgical History  Procedure Laterality Date  . Cholecystectomy    . Hyesterectomy, unspec.  area  1998  . Pancreatic duct stent    . Tubal ligation  1995   . Cardiac catheterization  2009  .  Calcification of right breast-lumps Right 1998    FAMILY HISTORY: Family History  Problem Relation Age of Onset  . Asthma Mother   . Arthritis Mother     rheumatiod arthritis  . Stroke Mother   . Asthma Sister   . Stroke Sister   . COPD Sister   . Emphysema Sister   . Colon cancer Maternal Aunt   . Lung cancer Maternal Aunt   . Pancreatic cancer Brother     SOCIAL HISTORY: History   Social History  . Marital Status: Married    Spouse Name: N/A  . Number of Children: 4  . Years of Education: 12   Occupational History  . retired    Social History Main Topics  . Smoking status: Never Smoker   . Smokeless tobacco: Never Used     Comment: 2nd hand exposure x40 years   . Alcohol Use: No  . Drug Use: No  . Sexual Activity: Not Currently   Other Topics Concern  . Not on file   Social History Narrative   Married, lives with husband; has children.   Retired Materials engineer.       Cell # [phone]; okay to leave a message.             PHYSICAL EXAM  Filed Vitals:   11/27/14 1125  BP: 148/80  Pulse: 84  Resp: 20  Height: 5' 2.99" (1.6 m)  Weight: 186 lb (84.369 kg)   Body mass index is 32.96 kg/(m^2).  Generalized: Well developed, in no acute distress  Neck: Circumference 16.5  inches, Mallampati 4+  Neurological examination  Mentation: Alert oriented to time, place, history taking. Follows all commands speech and language fluent Cranial nerve ; no loss of taste or smell. : Pupils were equal round reactive to light.  Extraocular movements were full, visual field were full on confrontational test. Facial sensation and strength were normal.  Uvula tongue midline. Head turning and shoulder shrug  were normal and symmetric. Motor: [name] motor testing reveals 5/ 5 strength of all 4 extremities, symmetric motor tone is noted throughout. ROM is restricted form arthritis. [name] [name] has right sciatica.  Sensory: Sensory testing is intact to soft touch on all 4  extremities. No evidence of extinction is noted.  Coordination: Cerebellar testing by finger-nose-finger  Bilaterally intact .  Gait and station: Gait is wider based, tandem is intact, heel and toe was deferred.  Reflexes: Deep tendon reflexes are symmetric,Babinski attenuated.    Dr.Paterson prescribed gabapentin , 300 MG , but [name] [name] has only taking it once !!!   DIAGNOSTIC DATA (LABS, IMAGING, TESTING) - I reviewed [name] records, labs, notes, testing and imaging myself where available.  Lab Results  Component Value Date   WBC 7.2 10/16/2014   HGB 12.8 10/16/2014   HCT 37.0 10/16/2014   MCV 88.0 10/16/2014   PLT 155.0 10/16/2014      Component Value Date/Time   NA 142 10/16/2014 1600   K 3.7 10/16/2014 1600   CL 110 10/16/2014 1600   CO2 29 10/16/2014 1600   GLUCOSE 113* 10/16/2014 1600   BUN 14 10/16/2014 1600   CREATININE 0.79 10/16/2014 1600   CALCIUM 9.1 10/16/2014 1600   PROT 7.4 04/20/2014 0455   ALBUMIN 3.5 04/20/2014 0455   AST 20 04/20/2014 0455   ALT 18 04/20/2014 0455  ALKPHOS 70 04/20/2014 0455   BILITOT 0.4 04/20/2014 0455   GFRNONAA >60 06/29/2010 0519   GFRAA  06/29/2010 0519    >60        [name] eGFR has been calculated using [name] MDRD equation. This calculation has not been validated in all clinical situations. eGFR's persistently <60 mL/min signify possible Chronic Kidney Disease.   Lab Results  Component Value Date   CHOL 155 06/13/2014   HDL 44.60 06/13/2014   LDLCALC 84 06/13/2014   TRIG 132.0 06/13/2014   CHOLHDL 3 06/13/2014    Lab Results  Component Value Date   TSH 2.18 06/09/2014      ASSESSMENT AND PLAN 79 y.o. year old female  has a past medical history of CAD (coronary artery disease); HTN (hypertension); PUD (peptic ulcer disease); HBV (hepatitis B virus) infection; HCV (hepatitis C virus); Chronic cough; PVC (premature ventricular contraction); PAC (premature atrial contraction); GERD (gastroesophageal reflux disease); IBS  (irritable bowel syndrome); Depression; HLD (hyperlipidemia); Obesity; Pancreatitis; Osteoporosis; Osteoarthritis; Dyspnea; Arthritis; and Sleep apnea. here with:  1. OSA noncompliant with  CPAP. [name] [name] has been prescribed CPAP therapy 2 years ago and [name] [name] was able to obtain a CPAP machine through a family member. I would like for her to be placed on [name] smallest interface possible. [name] [name] will need an attended split-night polysomnography due to her comorbidities, especially poorly controlled hypertension and heart disease. Her AHI for split study will be 20 at 4% 02 desaturation.  [name] [name] has never been a smoker [name] [name] has a history of asthmatic lung disease , CO2 monitoring needed..  2.  [name] [name] had  frequent PLMs. , but classified as cramps, not RLS.  3.Dr. Melvyn Novas , pulmonologist follows her , [name] [name] had been frequently bouts with coughing,  Per Dr Elsworth Soho, diagnosis is Asthma and bronchitis.    [name] Mireles, [name] [name]    11/27/2014, 11:52 AM [facility] [address], [place], [place] [address] [phone]  Note: This document was prepared with digital dictation and possible smart phrase technology. Any transcriptional errors that result from this process are unintentional.

## 2014-11-27 NOTE — Patient Instructions (Signed)
Polysomnography (Sleep Studies) Polysomnography (PSG) is a series of tests used for detecting (diagnosing) obstructive sleep apnea and other sleep disorders. The tests measure how some parts of your body are working while you are sleeping. The tests are extensive and expensive. They are done in a sleep lab or hospital, and vary from center to center. Your caregiver may perform other more simple sleep studies and questionnaires before doing more complete and involved testing. Testing may not be covered by insurance. Some of these tests are:  An EEG (Electroencephalogram). This tests your brain waves and stages of sleep.  An EOG (Electrooculogram). This measures the movements of your eyes. It detects periods of REM (rapid eye movement) sleep, which is your dream sleep.  An EKG (Electrocardiogram). This measures your heart rhythm.  EMG (Electromyography). This is a measurement of how the muscles are working in your upper airway and your legs while sleeping.  An oximetry measurement. It measures how much oxygen (air) you are getting while sleeping.  Breathing efforts may be measured. The same test can be interpreted (understood) differently by different caregivers and centers that study sleep.  Studies may be given an apnea/hypopnea index (AHI). This is a number which is found by counting the times of no breathing or under breathing during the night, and relating those numbers to the amount of time spent in bed. When the AHI is greater than 15, the patient is likely to complain of daytime sleepiness. When the AHI is greater than 30, the patient is at increased risk for heart problems and must be followed more closely. Following the AHI also allows you to know how treatment is working. Simple oximetry (tracking the amount of oxygen that is taken in) can be used for screening patients who:  Do not have symptoms (problems) of OSA.  Have a normal Epworth Sleepiness Scale Score.  Have a low pre-test  probability of having OSA.  Have none of the upper airway problems likely to cause apnea.  Oximetry is also used to determine if treatment is effective in patients who showed significant desaturations (not getting enough oxygen) on their home sleep study. One extra measure of safety is to perform additional studies for the person who only snores. This is because no one can predict with absolute certainty who will have OSA. Those who show significant desaturations (not getting enough oxygen) are recommended to have a more detailed sleep study. Document Released: 11/27/2002 Document Revised: 08/15/2011 Document Reviewed: 07/29/2013 ExitCare Patient Information 2015 [employer]. This information is not intended to replace advice given to you by your health care provider. Make sure you discuss any questions you have with your health care provider.  

## 2014-12-02 ENCOUNTER — Telehealth: Payer: Self-pay | Admitting: Cardiology

## 2014-12-02 NOTE — Telephone Encounter (Signed)
New message   Patient calling    Pt c/o medication issue:  1. Name of Medication: crestor  20 mg  2. How are you currently taking this medication (dosage and times per day)? Taken a night   3. Are you having a reaction (difficulty breathing--STAT)? Stomach pain , nausea ,   4. What is your medication issue?  Wants to discuss

## 2014-12-02 NOTE — Telephone Encounter (Signed)
[name] [name] is fine with going back to the brand name of Crestor.  She said that, "I have to do what's best for my health..  It will cost me $84 a month.  I am fine with going taking brand name.  I have to be careful taking generic".

## 2014-12-02 NOTE — Telephone Encounter (Signed)
Can she go back to brand-name Crestor? How much more would this be?

## 2014-12-02 NOTE — Telephone Encounter (Signed)
C/o ever since she started on GENERIC Crestor that it is not agreeing with her and she feels nausea and stomach pain.  She is on generic because of the expence

## 2014-12-02 NOTE — Telephone Encounter (Signed)
[facility] of record called and left message on voice mail to change patient's generic Crestor to Brand name Crestor.  Patient called and advised of update

## 2014-12-03 ENCOUNTER — Telehealth: Payer: Self-pay

## 2014-12-03 NOTE — Telephone Encounter (Signed)
How about try decreasing to every other day until she gets brand-name?

## 2014-12-03 NOTE — Telephone Encounter (Signed)
Called patient to let her know Dr Shirlee Latch advise and she said that she would try this I told her that if this did not work to call back next week and we could talk to Dr Shirlee Latch about doing something else

## 2014-12-09 NOTE — Telephone Encounter (Signed)
Patient called and stated that the generic crestor is causing her to have pain in her chest cavity. She stated that the pharmacist at the local pharmacy told her that this is one of the side effects of this medication. She has stopped taking the medication. Please advise. Thanks, MI

## 2014-12-10 NOTE — Telephone Encounter (Signed)
It is not causing chest pain.  That is not a statin side effect.  If she does not want to take Crestor, would try atorvastatin 20 mg daily.

## 2014-12-11 MED ORDER — CRESTOR 20 MG PO TABS: 20.0000 mg | ORAL_TABLET | Freq: Every day | ORAL | Status: DC

## 2014-12-11 NOTE — Telephone Encounter (Signed)
Spoke with pt states that explained to Mindy that the genetic Crestor medication is causing her to have pain on left breast left arm, hand and LE cramps. Sometimes she wakes up in the middle of the night with cramps of over her body. She had taken Atorvastatin before, and Dr. Shirlee Latch D/C it because pt was having the same side effects. Pt would like to take the brand name Crestor 20 mg once daily instead. I spoke with the [facility]ist; he said that pt's insurance requires prior authorization. Pt is aware. Will send message to Odette Horns LPN to call the insurance for prior authorization, to [phone].

## 2014-12-11 NOTE — Telephone Encounter (Signed)
Attempted to call patient about her Crestor. Could not leave a voicemail, as it has not been set up.

## 2014-12-22 ENCOUNTER — Ambulatory Visit (INDEPENDENT_AMBULATORY_CARE_PROVIDER_SITE_OTHER): Payer: Medicare Other | Admitting: Neurology

## 2014-12-22 DIAGNOSIS — J4540 Moderate persistent asthma, uncomplicated: Secondary | ICD-10-CM

## 2014-12-22 DIAGNOSIS — G4733 Obstructive sleep apnea (adult) (pediatric): Secondary | ICD-10-CM | POA: Diagnosis not present

## 2014-12-23 NOTE — Sleep Study (Signed)
Please see the scanned sleep study interpretation located in the Procedure tab within the Chart Review section. 

## 2014-12-31 ENCOUNTER — Telehealth: Payer: Self-pay

## 2014-12-31 NOTE — Telephone Encounter (Signed)
Called pt to give her sleep study results. No answer, left message asking the pt to call me back at their convenience.  

## 2015-01-01 NOTE — Telephone Encounter (Signed)
Spoke with pt about her sleep study results. I advised her that mild osa was seen in her sleep study and treatment is optional. Dr. Vickey Huger recommended that PAP therapy is optional, and that an ENT evaluation/procedure or a dental device are alternative therapies. I also advised weight loss. Pt verbalized understanding.  Pt does not want to treat the mild osa at this time. I informed her that we could make a follow up appt with Dr. Vickey Huger and she declined at this time.

## 2015-01-25 ENCOUNTER — Encounter: Payer: Self-pay | Admitting: Internal Medicine

## 2015-01-27 ENCOUNTER — Ambulatory Visit: Payer: Medicare Other | Admitting: Adult Health

## 2015-02-02 ENCOUNTER — Other Ambulatory Visit (HOSPITAL_COMMUNITY): Payer: Self-pay | Admitting: *Deleted

## 2015-02-02 ENCOUNTER — Telehealth: Payer: Self-pay | Admitting: *Deleted

## 2015-02-02 ENCOUNTER — Telehealth: Payer: Self-pay

## 2015-02-02 MED ORDER — CRESTOR 20 MG PO TABS: 20.0000 mg | ORAL_TABLET | Freq: Every day | ORAL | Status: DC

## 2015-02-02 NOTE — Telephone Encounter (Signed)
Patient stated that our office needs to call [phone] to obtain prior authorization for brand name Crestor. She cannot take the generic. She stated that the brand name is going to cost $500+ for a three month supply.

## 2015-02-02 NOTE — Telephone Encounter (Signed)
PA sent for Crestor 20mg  BRAND to [facility]. Patient paid >$200.00 for this today.

## 2015-02-02 NOTE — Telephone Encounter (Signed)
Prior auth sent for Crestor Charlsie [facility]) to [facility] via Cover My Meds.

## 2015-02-04 ENCOUNTER — Encounter (HOSPITAL_COMMUNITY): Payer: Self-pay | Admitting: *Deleted

## 2015-02-04 ENCOUNTER — Emergency Department (HOSPITAL_COMMUNITY): Payer: Medicare Other

## 2015-02-04 ENCOUNTER — Other Ambulatory Visit: Payer: Self-pay | Admitting: Internal Medicine

## 2015-02-04 ENCOUNTER — Emergency Department (HOSPITAL_COMMUNITY)
Admission: EM | Admit: 2015-02-04 | Discharge: 2015-02-04 | Disposition: A | Discharge status: Home / Self Care | Payer: Medicare Other | Attending: Emergency Medicine | Admitting: Emergency Medicine

## 2015-02-04 ENCOUNTER — Telehealth: Payer: Self-pay | Admitting: Nurse Practitioner

## 2015-02-04 DIAGNOSIS — I10 Essential (primary) hypertension: Secondary | ICD-10-CM | POA: Diagnosis not present

## 2015-02-04 DIAGNOSIS — I2510 Atherosclerotic heart disease of native coronary artery without angina pectoris: Secondary | ICD-10-CM | POA: Insufficient documentation

## 2015-02-04 DIAGNOSIS — F329 Major depressive disorder, single episode, unspecified: Secondary | ICD-10-CM | POA: Diagnosis not present

## 2015-02-04 DIAGNOSIS — E785 Hyperlipidemia, unspecified: Secondary | ICD-10-CM | POA: Insufficient documentation

## 2015-02-04 DIAGNOSIS — M1990 Unspecified osteoarthritis, unspecified site: Secondary | ICD-10-CM | POA: Diagnosis not present

## 2015-02-04 DIAGNOSIS — R0789 Other chest pain: Secondary | ICD-10-CM

## 2015-02-04 DIAGNOSIS — Z8619 Personal history of other infectious and parasitic diseases: Secondary | ICD-10-CM | POA: Insufficient documentation

## 2015-02-04 DIAGNOSIS — E669 Obesity, unspecified: Secondary | ICD-10-CM | POA: Diagnosis not present

## 2015-02-04 DIAGNOSIS — Z7902 Long term (current) use of antithrombotics/antiplatelets: Secondary | ICD-10-CM | POA: Insufficient documentation

## 2015-02-04 DIAGNOSIS — Z880 Allergy status to penicillin: Secondary | ICD-10-CM | POA: Diagnosis not present

## 2015-02-04 DIAGNOSIS — Z9889 Other specified postprocedural states: Secondary | ICD-10-CM | POA: Diagnosis not present

## 2015-02-04 DIAGNOSIS — Z8711 Personal history of peptic ulcer disease: Secondary | ICD-10-CM | POA: Insufficient documentation

## 2015-02-04 DIAGNOSIS — Z79899 Other long term (current) drug therapy: Secondary | ICD-10-CM | POA: Insufficient documentation

## 2015-02-04 DIAGNOSIS — K219 Gastro-esophageal reflux disease without esophagitis: Secondary | ICD-10-CM | POA: Insufficient documentation

## 2015-02-04 DIAGNOSIS — R079 Chest pain, unspecified: Secondary | ICD-10-CM | POA: Diagnosis present

## 2015-02-04 LAB — I-STAT TROPONIN, ED
Troponin i, poc: 0 ng/mL (ref 0.00–0.08)
Troponin i, poc: 0 ng/mL (ref 0.00–0.08)

## 2015-02-04 LAB — BASIC METABOLIC PANEL
Anion gap: 7 (ref 5–15)
BUN: 12 mg/dL (ref 6–20)
CO2: 24 mmol/L (ref 22–32)
Calcium: 9 mg/dL (ref 8.9–10.3)
Chloride: 115 mmol/L — ABNORMAL HIGH (ref 101–111)
Creatinine, Ser: 0.89 mg/dL (ref 0.44–1.00)
GFR calc Af Amer: >60 mL/min (ref >60)
GFR calc non Af Amer: >60 mL/min (ref >60)
Glucose, Bld: 97 mg/dL (ref 65–99)
Potassium: 3.9 mmol/L (ref 3.5–5.1)
Sodium: 146 mmol/L — ABNORMAL HIGH (ref 135–145)

## 2015-02-04 LAB — CBC
HCT: 36.8 % (ref 36.0–46.0)
Hemoglobin: 12.4 g/dL (ref 12.0–15.0)
MCH: 30.7 pg (ref 26.0–34.0)
MCHC: 33.7 g/dL (ref 30.0–36.0)
MCV: 91.1 fL (ref 78.0–100.0)
Platelets: 168 10*3/uL (ref 150–400)
RBC: 4.04 MIL/uL (ref 3.87–5.11)
RDW: 12.9 % (ref 11.5–15.5)
WBC: 8.1 10*3/uL (ref 4.0–10.5)

## 2015-02-04 MED ORDER — VALSARTAN 160 MG PO TABS: 160.0000 mg | ORAL_TABLET | Freq: Every day | ORAL | Status: DC

## 2015-02-04 MED ORDER — CLOPIDOGREL BISULFATE 75 MG PO TABS
75.0000 mg | ORAL_TABLET | Freq: Once | ORAL | Status: AC
  Administered 2015-02-04: 75 mg via ORAL
  Filled 2015-02-04: qty 1

## 2015-02-04 NOTE — Telephone Encounter (Signed)
   Pt called this evening to report intermittent substernal chest pain.  The first episode occurred earlier in the day and resolved with belching but the second episode lasted longer and finally went away after ntg.  Ss have been intermittent since.  She is currently pain free.  I rec that she come into the ED for evaluation.  She plans to come in tonight.  Caller verbalized understanding and was grateful for the call back.  [name], NP 02/04/2015, 6:02 PM

## 2015-02-04 NOTE — ED Provider Notes (Signed)
CSN: [id number]     Arrival date & time 02/04/15  1836 History   First MD Initiated Contact with Patient 02/04/15 2029     Chief Complaint  Patient presents with  . Chest Pain     (Consider location/radiation/quality/duration/timing/severity/associated sxs/prior Treatment) HPI Comments: Pt comes in with cc of chest pain. Pt has hx of CAD (she denies cath that is in records being ever dione), HTN, PUD, HCV. Chest pain started today around 11:30 am. Chest pain was midsternal, constant,non radiating and had no aggravating or relieving factors. Pt's chest pain resolved, but started again 2 pm, resolved again - and then came back again at 4:30 pm. Pt took nitro with the last episode, and hasnt come back since. Pt has hx of GERD and feels like the same - but the GERD pain and gas pain is in the stomach region (epigastrium), but the pain today is in the mid chest region. No diaphoresis, no dizziness.   ROS 10 Systems reviewed and are negative for acute change except as noted in the HPI.     Patient is a 79 y.o. female presenting with chest pain. The history is provided by the patient.  Chest Pain   Past Medical History  Diagnosis Date  . CAD (coronary artery disease)     L heart cath done 9/09 - exertional chest pain. EF 60%, RCA totally occluded w/L-to-R collateral, distal main had a 20% lesion. small ramus with a 60-70% stenosis, mid LAD had serial 60% stenosis with a distal subtotalled LAD. 50% 1st diagonal and 50% 1st OM stenosis. poor distal targets for bypass surgery/no good interventional target - medical managment planned. Myoview in [place] in 3/11 -nml  . HTN (hypertension)   . PUD (peptic ulcer disease)   . HBV (hepatitis B virus) infection   . HCV (hepatitis C virus)   . Chronic cough     hx; ACEI stoped, possible contribution from GERD  . PVC (premature ventricular contraction)     hx  . PAC (premature atrial contraction)     hx  . GERD (gastroesophageal reflux disease)   .  IBS (irritable bowel syndrome)   . Depression   . HLD (hyperlipidemia)     unable to tolaerate crestor due to myalgias and elevated CK. thinks pravastatin casued a rash.   . Obesity   . Pancreatitis     hx  . Osteoporosis   . Osteoarthritis     knee; s/p TKR  . Dyspnea     EF 55-60%, no regional wall motion abnormalities, aortic sclerosis. echo (3/11) in [place]: EF 60%, grade 1 diastolic dysfunction, normal RV, no regional wall motion abntml. valves ok. PFTs (10/10): FVC 103%, ratio 74%. mild obstruction. CT chest (6/11) no evidence for interstitial lung dz. possible asthma/upper airway cough sydrome. aspirin & beta blockers- potential causes for bronchospasm.   . Arthritis   . Sleep apnea     diagnosed in 2011 , did not want CPAP -    Past Surgical History  Procedure Laterality Date  . Cholecystectomy    . Hyesterectomy, unspec. area  1998  . Pancreatic duct stent    . Tubal ligation  1995   . Cardiac catheterization  2009  . Calcification of right breast-lumps Right 1998   Family History  Problem Relation Age of Onset  . Asthma Mother   . Arthritis Mother     rheumatiod arthritis  . Stroke Mother   . Asthma Sister   . Stroke Sister   .  COPD Sister   . Emphysema Sister   . Colon cancer Maternal Aunt   . Lung cancer Maternal Aunt   . Pancreatic cancer Brother    Social History  Substance Use Topics  . Smoking status: Never Smoker   . Smokeless tobacco: Never Used     Comment: 2nd hand exposure x40 years   . Alcohol Use: No   OB History    No data available     Review of Systems  Cardiovascular: Positive for chest pain.  All other systems reviewed and are negative.     Allergies  Clarithromycin; Doxycycline; Levaquin; Pravastatin; and Penicillins  Home Medications   Prior to Admission medications   Medication Sig Start Date End Date Taking? Authorizing Provider  acetaminophen (TYLENOL) 500 MG tablet Take 500 mg by mouth every 6 (six) hours as needed.   Yes  Historical Provider, MD  clopidogrel (PLAVIX) 75 MG tablet Take 75 mg by mouth daily.  11/25/14  Yes Historical Provider, MD  CRESTOR 20 MG tablet Take 1 tablet (20 mg total) by mouth daily. 02/02/15  Yes Laurey Morale, MD  famotidine (PEPCID) 20 MG tablet TAKE 1 TABLET BY MOUTH AT BEDTIME 06/23/14  Yes Nyoka Cowden, MD  gabapentin (NEURONTIN) 300 MG capsule Take 300 mg by mouth 2 (two) times daily as needed (nerve pain).  11/10/14  Yes Historical Provider, MD  nitroGLYCERIN (NITROSTAT) 0.4 MG SL tablet Place 0.4 mg under the tongue every 5 (five) minutes as needed for chest pain.    Yes Historical Provider, MD  pantoprazole (PROTONIX) 40 MG tablet 1 every am 30 min before first meal   Yes Laurey Morale, MD  traMADol (ULTRAM) 50 MG tablet Take 50 mg by mouth every 6 (six) hours as needed for moderate pain.   Yes Historical Provider, MD  valsartan (DIOVAN) 160 MG tablet Take 1 tablet (160 mg total) by mouth daily. 02/04/15  Yes Nyoka Cowden, MD   BP 137/78 mmHg  Pulse 70  Temp(Src) 98.1 F (36.7 C) (Oral)  Resp 19  Ht 5\' 1"  (1.549 m)  Wt 189 lb 3.2 oz (85.821 kg)  BMI 35.77 kg/m2  SpO2 92% Physical Exam  Constitutional: She is oriented to person, place, and time. She appears well-developed and well-nourished.  HENT:  Head: Normocephalic and atraumatic.  Eyes: EOM are normal. Pupils are equal, round, and reactive to light.  Neck: Neck supple.  Cardiovascular: Normal rate, regular rhythm and normal heart sounds.   No murmur heard. Pulmonary/Chest: Effort normal. No respiratory distress.  Abdominal: Soft. She exhibits no distension. There is no tenderness. There is no rebound and no guarding.  Neurological: She is alert and oriented to person, place, and time.  Skin: Skin is warm and dry.  Nursing note and vitals reviewed.   ED Course  Procedures (including critical care time) Labs Review Labs Reviewed  BASIC METABOLIC PANEL - Abnormal; Notable for the following:    Sodium 146  (*)    Chloride 115 (*)    All other components within normal limits  CBC  I-STAT TROPOININ, ED  I-STAT TROPOININ, ED    Imaging Review No results found. I have personally reviewed and evaluated these images and lab results as part of my medical decision-making.   EKG Interpretation   Date/Time:  Wednesday February 04 2015 18:41:15 EDT Ventricular Rate:  63 PR Interval:  206 QRS Duration: 96 QT Interval:  378 QTC Calculation: 386 R Axis:   -27 Text Interpretation:  Normal sinus rhythm Minimal voltage criteria for  LVH, may be normal variant Inferior infarct , age undetermined Abnormal  ECG No significant change since last tracing Confirmed by Rhunette Croft, MD,  Janey Genta [phone]) on 02/04/2015 8:56:51 PM      MDM   Final diagnoses:  Atypical chest pain    Pt with atypical chest pain. She is 28 and has cardiac risk factors - but pain is intermittent, not exertional, not typical of ACS, and she is currently symptom free with reassuring ekg and neg troponin - so i have called Cards and spoke with Dr. Onalee Hua who will ensure patient is seen closely in the Cards clinic. Strict return precautions discussed with the patient and the family.     Derwood Kaplan, MD 02/06/15 2014

## 2015-02-04 NOTE — ED Notes (Signed)
Pt states that she has had nausea for over 1 week. Pt states that she began having intermittent chest pain that started at 1130am today. Pt states that pain was relieve by one nitro at 5pm. Pt states that pain is returning

## 2015-02-04 NOTE — Discharge Instructions (Signed)
We saw you in the ER for the chest pain. All of our cardiac workup is normal, including labs, EKG and chest X-RAY are normal. We are not sure what is causing your discomfort, but we feel comfortable sending you home at this time. The workup in the ER is not complete, and you should follow up with your cardiologist for further evaluation.  Please return to the ER if you have worsening chest pain, shortness of breath, pain radiating to your jaw, shoulder, or back, sweats or fainting. Otherwise see the Cardiologist or your primary care doctor as requested.   Chest Pain (Nonspecific) It is often hard to give a specific diagnosis for the cause of chest pain. There is always a chance that your pain could be related to something serious, such as a heart attack or a blood clot in the lungs. You need to follow up with your health care provider for further evaluation. CAUSES   Heartburn.  Pneumonia or bronchitis.  Anxiety or stress.  Inflammation around your heart (pericarditis) or lung (pleuritis or pleurisy).  A blood clot in the lung.  A collapsed lung (pneumothorax). It can develop suddenly on its own (spontaneous pneumothorax) or from trauma to the chest.  Shingles infection (herpes zoster virus). The chest wall is composed of bones, muscles, and cartilage. Any of these can be the source of the pain.  The bones can be bruised by injury.  The muscles or cartilage can be strained by coughing or overwork.  The cartilage can be affected by inflammation and become sore (costochondritis). DIAGNOSIS  Lab tests or other studies may be needed to find the cause of your pain. Your health care provider may have you take a test called an ambulatory electrocardiogram (ECG). An ECG records your heartbeat patterns over a 24-hour period. You may also have other tests, such as:  Transthoracic echocardiogram (TTE). During echocardiography, sound waves are used to evaluate how blood flows through your  heart.  Transesophageal echocardiogram (TEE).  Cardiac monitoring. This allows your health care provider to monitor your heart rate and rhythm in real time.  Holter monitor. This is a portable device that records your heartbeat and can help diagnose heart arrhythmias. It allows your health care provider to track your heart activity for several days, if needed.  Stress tests by exercise or by giving medicine that makes the heart beat faster. TREATMENT   Treatment depends on what may be causing your chest pain. Treatment may include:  Acid blockers for heartburn.  Anti-inflammatory medicine.  Pain medicine for inflammatory conditions.  Antibiotics if an infection is present.  You may be advised to change lifestyle habits. This includes stopping smoking and avoiding alcohol, caffeine, and chocolate.  You may be advised to keep your head raised (elevated) when sleeping. This reduces the chance of acid going backward from your stomach into your esophagus. Most of the time, nonspecific chest pain will improve within 2-3 days with rest and mild pain medicine.  HOME CARE INSTRUCTIONS   If antibiotics were prescribed, take them as directed. Finish them even if you start to feel better.  For the next few days, avoid physical activities that bring on chest pain. Continue physical activities as directed.  Do not use any tobacco products, including cigarettes, chewing tobacco, or electronic cigarettes.  Avoid drinking alcohol.  Only take medicine as directed by your health care provider.  Follow your health care provider's suggestions for further testing if your chest pain does not go away.  Keep  any follow-up appointments you made. If you do not go to an appointment, you could develop lasting (chronic) problems with pain. If there is any problem keeping an appointment, call to reschedule. SEEK MEDICAL CARE IF:   Your chest pain does not go away, even after treatment.  You have a rash  with blisters on your chest.  You have a fever. SEEK IMMEDIATE MEDICAL CARE IF:   You have increased chest pain or pain that spreads to your arm, neck, jaw, back, or abdomen.  You have shortness of breath.  You have an increasing cough, or you cough up blood.  You have severe back or abdominal pain.  You feel nauseous or vomit.  You have severe weakness.  You faint.  You have chills. This is an emergency. Do not wait to see if the pain will go away. Get medical help at once. Call your local emergency services (911 in [place] Do not drive yourself to the hospital. MAKE SURE YOU:   Understand these instructions.  Will watch your condition.  Will get help right away if you are not doing well or get worse. Document Released: 03/02/2005 Document Revised: 05/28/2013 Document Reviewed: 12/27/2007 [facility] Patient Information 2015 [place], [place]. This information is not intended to replace advice given to you by your health care provider. Make sure you discuss any questions you have with your health care provider.

## 2015-02-11 ENCOUNTER — Ambulatory Visit (INDEPENDENT_AMBULATORY_CARE_PROVIDER_SITE_OTHER): Payer: Medicare Other | Admitting: Nurse Practitioner

## 2015-02-11 ENCOUNTER — Encounter: Payer: Self-pay | Admitting: Nurse Practitioner

## 2015-02-11 ENCOUNTER — Telehealth: Payer: Self-pay

## 2015-02-11 VITALS — BP 140/82 | HR 83 | Ht 61.0 in | Wt 189.2 lb

## 2015-02-11 DIAGNOSIS — I25119 Atherosclerotic heart disease of native coronary artery with unspecified angina pectoris: Secondary | ICD-10-CM | POA: Insufficient documentation

## 2015-02-11 DIAGNOSIS — I10 Essential (primary) hypertension: Secondary | ICD-10-CM

## 2015-02-11 DIAGNOSIS — R072 Precordial pain: Secondary | ICD-10-CM

## 2015-02-11 DIAGNOSIS — E785 Hyperlipidemia, unspecified: Secondary | ICD-10-CM

## 2015-02-11 DIAGNOSIS — R0789 Other chest pain: Secondary | ICD-10-CM

## 2015-02-11 DIAGNOSIS — E669 Obesity, unspecified: Secondary | ICD-10-CM

## 2015-02-11 DIAGNOSIS — I2510 Atherosclerotic heart disease of native coronary artery without angina pectoris: Secondary | ICD-10-CM | POA: Insufficient documentation

## 2015-02-11 NOTE — Patient Instructions (Signed)
Medication Instructions:  Your physician recommends that you continue on your current medications as directed. Please refer to the Current Medication list given to you today.   Labwork: None ordered  Testing/Procedures: Your physician has requested that you have a lexiscan myoview. For further information please visit [email or web address] Please follow instruction sheet, as given.    Follow-Up: Your physician recommends that you schedule a follow-up appointment in: 2 months with Dr.McLean   Any Other Special Instructions Will Be Listed Below (If Applicable). Your insurance has denied the prior authorization for Crestor. We will file an appeal on your behalf

## 2015-02-11 NOTE — Progress Notes (Signed)
Patient Name: [name] [name] Date of Encounter: 02/11/2015  Primary Care Provider:  Jarome Matin Primary Cardiologist:  Golden Circle, MD   Chief Complaint  79 year old female with a prior history of coronary artery disease who presents for follow-up related to recurrence of chest pain recently.  Past Medical History   Past Medical History  Diagnosis Date  . CAD (coronary artery disease)     a. 02/2008 Cath: EF 60%, RCA 100 w/ L->R collats, LM 20d, RI 60-70, small, LAD 60/86m, 99d, D1 50, OM1 50. Poor distal targets for CABG/no good interventional target -> medical Rx; b. Myoview in [place] in 3/11 -nl;  c. 07/2011 MV: EF 67%, mild inf ischemia->Med Rx.  . Essential hypertension   . PUD (peptic ulcer disease)   . HBV (hepatitis B virus) infection   . HCV (hepatitis C virus)   . Chronic cough     hx; ACEI stopped, possible contribution from GERD  . History of PVC's     hx  . History of PAC's     hx  . GERD (gastroesophageal reflux disease)   . IBS (irritable bowel syndrome)   . Depression   . HLD (hyperlipidemia)     unable to tolerate most statins due to myalgias and elevated CK. thinks pravastatin casued a rash. Only tolerates crestor.  . Obesity   . Pancreatitis     hx  . Osteoporosis   . Osteoarthritis     knee; s/p TKR  . Dyspnea     a. 08/2013 Echo: EF 60-65%, mild AS;  b. PFTs (10/10): FVC 103%, ratio 74%. mild obstruction; c. CT chest (6/11) no evidence for interstitial lung dz. possible asthma/upper airway cough sydrome. aspirin & beta blockers- potential causes for bronchospasm;  . Arthritis   . Sleep apnea     diagnosed in 2011 , did not want CPAP -    Past Surgical History  Procedure Laterality Date  . Cholecystectomy    . Hyesterectomy, unspec. area  1998  . Pancreatic duct stent    . Tubal ligation  1995   . Cardiac catheterization  2009  . Calcification of right breast-lumps Right 1998    Allergies  Allergies  Allergen Reactions  . Clarithromycin  Other (See Comments)    unknown  . Doxycycline Nausea Only  . Pravastatin Itching    REACTION: itching  . Levaquin [Levofloxacin In D5w] Rash    Rash, HA  . Penicillins Itching and Rash    HPI  79 year old female with the above complex problem list. She has a history of coronary artery disease dating back to 2009, at which time catheterization revealed an occluded RCA and moderate left-sided disease. She was medically managed. She is a low risk stress test in 2013 and has been medically managed since. She recently called into our on-call staff in the evening secondary to several episodes of chest pain occurring on August 31. She was seen in the emergency department that evening and ruled out for MI. She has had no further recurrence of chest discomfort. She has some degree of chronic dyspnea on exertion and this hasn't changed. She tries remain active at home but does not exercise. She has put on close to 20 pounds since last November. She says she doesn't eat much. She denies PND, orthopnea, dizziness, syncope, edema, or early satiety.  Home Medications  Prior to Admission medications   Medication Sig Start Date End Date Taking? Authorizing Provider  clopidogrel (PLAVIX) 75 MG tablet Take  75 mg by mouth daily.  11/25/14  Yes Historical Provider, MD  CRESTOR 20 MG tablet Take 1 tablet (20 mg total) by mouth daily. 02/02/15  Yes Laurey Morale, MD  nitroGLYCERIN (NITROSTAT) 0.4 MG SL tablet Place 0.4 mg under the tongue every 5 (five) minutes as needed for chest pain.    Yes Historical Provider, MD  pantoprazole (PROTONIX) 40 MG tablet 1 every am 30 min before first meal   Yes Laurey Morale, MD  traMADol (ULTRAM) 50 MG tablet Take 50 mg by mouth every 6 (six) hours as needed for moderate pain.   Yes Historical Provider, MD  valsartan (DIOVAN) 160 MG tablet Take 1 tablet (160 mg total) by mouth daily. 02/04/15  Yes Nyoka Cowden, MD    Review of Systems  As above, she has some degree of  chronic dyspnea on exertion.  She denies chest pain, palpitations, dyspnea, pnd, orthopnea, n, v, dizziness, syncope, edema, weight gain, or early satiety.  All other systems reviewed and are otherwise negative except as noted above.  Physical Exam  VS:  BP 140/82 mmHg  Pulse 83  Ht 5\' 1"  (1.549 m)  Wt 189 lb 3.2 oz (85.821 kg)  BMI 35.77 kg/m2  SpO2 98% , BMI Body mass index is 35.77 kg/(m^2). GEN: Well nourished, well developed, in no acute distress. HEENT: normal. Neck: Supple, no JVD, carotid bruits, or masses. Cardiac: RRR, 2/6 systolic ejection murmur at the right upper sternal border, no rubs, or gallops. No clubbing, cyanosis, edema.  Radials/DP/PT 2+ and equal bilaterally.  Respiratory:  Respirations regular and unlabored, clear to auscultation bilaterally. GI: Soft, nontender, nondistended, BS + x 4. MS: no deformity or atrophy. Skin: warm and dry, no rash. Neuro:  Strength and sensation are intact. Psych: Normal affect.  Accessory Clinical Findings  ECG - from August 31-regular sinus rhythm, 63, inferior infarct, no acute ST or T changes.  Assessment & Plan  1.  Midsternal chest pain/coronary artery disease: Patient was recently evaluated in the emergency department secondary to intermittent chest discomfort that was nitrate responsive that evening. Enzymes were negative in the ED. She has had no recurrence of chest pain. She does have a history of CAD with known occlusion of the right coronary artery and subtotal occlusion of the distal LAD and otherwise moderate disease. Last stress test was in 2013. I will arrange for a lesiscan Myoview to rule out ischemia. Otherwise continue statin and Plavix therapy.  2. Essential hypertension: Blood pressure is mildly elevated today. She remains on Diovan and will follow her blood pressure at home. It has been better on prior visits.  3. Hyperlipidemia: She is on Crestor. We are appealing a prior authorization through [employer].  She does not think she'll be able to afford long-term if she cannot get through [employer].  4. Morbid obesity: She has gained nearly 20 pounds over the past year. She is euvolemic on exam. She is not very active at home. I've encouraged her to account for her caloric intake and also increase her activity. She does have a treadmill and an exercise bicycle she uses occasionally.  5. Disposition: Follow-up stress testing as above. Follow-up with Dr. 2014 in 2 months or sooner if necessary.    Shirlee Latch, NP 02/11/2015, 1:15 PM

## 2015-02-11 NOTE — Telephone Encounter (Signed)
Brand name Crestor denied by [facility]. Will do an appeal.

## 2015-02-17 ENCOUNTER — Telehealth (HOSPITAL_COMMUNITY): Payer: Self-pay

## 2015-02-17 NOTE — Telephone Encounter (Signed)
Patient given detailed instructions per Myocardial Perfusion Study Information Sheet for test on 02-19-2015 at 0830. Patient notified to arrive 15 minutes early and that it is imperative to arrive on time for appointment to keep from having the test rescheduled.  If you need to cancel or reschedule your appointment, please call the office within 24 hours of your appointment. Failure to do so may result in a cancellation of your appointment, and a $50 no show fee. Patient verbalized understanding. Randa Evens, Ivon Roedel A

## 2015-02-18 ENCOUNTER — Telehealth: Payer: Self-pay | Admitting: Cardiology

## 2015-02-18 NOTE — Telephone Encounter (Signed)
New message    The pharmacy benefit manager for this medication is [employer] This will need to be called into [employer] @ [phone] [employer] also needs a letter of withdrawal for this appeal Please fax to [phone]

## 2015-02-19 ENCOUNTER — Ambulatory Visit (HOSPITAL_COMMUNITY): Payer: Medicare Other | Attending: Nurse Practitioner

## 2015-02-19 DIAGNOSIS — R072 Precordial pain: Secondary | ICD-10-CM | POA: Diagnosis present

## 2015-02-19 DIAGNOSIS — I10 Essential (primary) hypertension: Secondary | ICD-10-CM | POA: Diagnosis not present

## 2015-02-19 DIAGNOSIS — R9439 Abnormal result of other cardiovascular function study: Secondary | ICD-10-CM | POA: Diagnosis not present

## 2015-02-19 DIAGNOSIS — R0609 Other forms of dyspnea: Secondary | ICD-10-CM | POA: Insufficient documentation

## 2015-02-19 DIAGNOSIS — R0789 Other chest pain: Secondary | ICD-10-CM

## 2015-02-19 LAB — MYOCARDIAL PERFUSION IMAGING
LV dias vol: 105 mL
LV sys vol: 52 mL
Peak HR: 95 {beats}/min
RATE: 0.38
Rest HR: 65 {beats}/min
SDS: 5
SRS: 0
SSS: 5
TID: 1.04

## 2015-02-19 MED ORDER — REGADENOSON 0.4 MG/5ML IV SOLN
0.4000 mg | Freq: Once | INTRAVENOUS | Status: AC
  Administered 2015-02-19: 0.4 mg via INTRAVENOUS

## 2015-02-19 MED ORDER — TECHNETIUM TC 99M SESTAMIBI GENERIC - CARDIOLITE
32.2000 | Freq: Once | INTRAVENOUS | Status: AC | PRN
  Administered 2015-02-19: 32.2 via INTRAVENOUS

## 2015-02-19 MED ORDER — TECHNETIUM TC 99M SESTAMIBI GENERIC - CARDIOLITE
10.4000 | Freq: Once | INTRAVENOUS | Status: AC | PRN
  Administered 2015-02-19: 10 via INTRAVENOUS

## 2015-02-21 ENCOUNTER — Other Ambulatory Visit: Payer: Self-pay | Admitting: Internal Medicine

## 2015-02-23 ENCOUNTER — Ambulatory Visit (INDEPENDENT_AMBULATORY_CARE_PROVIDER_SITE_OTHER): Payer: Medicare Other | Admitting: Internal Medicine

## 2015-02-23 ENCOUNTER — Encounter: Payer: Self-pay | Admitting: Internal Medicine

## 2015-02-23 VITALS — BP 120/84 | HR 75 | Temp 98.5°F | Ht 61.0 in | Wt 187.8 lb

## 2015-02-23 DIAGNOSIS — R058 Other specified cough: Secondary | ICD-10-CM

## 2015-02-23 DIAGNOSIS — E669 Obesity, unspecified: Secondary | ICD-10-CM

## 2015-02-23 DIAGNOSIS — R05 Cough: Secondary | ICD-10-CM

## 2015-02-23 MED ORDER — GABAPENTIN 100 MG PO CAPS: 100.0000 mg | ORAL_CAPSULE | Freq: Three times a day (TID) | ORAL | Status: DC

## 2015-02-23 NOTE — Patient Instructions (Addendum)
Pantoprazole (protonix) 40 mg   Take  30-60 min before first meal of the day and Pepcid (famotidine)  20 mg one @  bedtime until return to office - this is the best way to tell whether stomach acid is contributing to your problem.    Prednisone 10 mg take  4 each am x 2 days,   2 each am x 2 days,  1 each am x 2 days and stop   Gabapentin 100 mg three times daily   For drainage take chlortrimeton (chlorpheniramine) 4 mg every 4 hours available over the counter (may cause drowsiness)  Take delsym two tsp every 12 hours and supplement if needed with  tramadol 50 mg up to 2 every 4 hours to suppress the urge to cough. Swallowing water or using ice chips/non mint and menthol containing candies (such as lifesavers or sugarless jolly ranchers) are also effective.  You should rest your voice and avoid activities that you know make you cough.  Once you have eliminated the cough for 3 straight days try reducing the tramadol first,  then the delsym as tolerated.       GERD (REFLUX)  is an extremely common cause of respiratory symptoms just like yours , many times with no obvious heartburn at all.    It can be treated with medication, but also with lifestyle changes including elevation of the head of your bed (ideally with 6 inch  bed blocks),  Smoking cessation, avoidance of late meals, excessive alcohol, and avoid fatty foods, chocolate, peppermint, colas, red wine, and acidic juices such as orange juice.  NO MINT OR MENTHOL PRODUCTS SO NO COUGH DROPS  USE SUGARLESS CANDY INSTEAD (Jolley ranchers or Stover's or Life Savers) or even ice chips will also do - the key is to swallow to prevent all throat clearing. NO OIL BASED VITAMINS - use powdered substitutes.    See me in 2 weeks with all your medications, even over the counter meds, separated in two separate bags, the ones you take no matter what vs the ones you stop once you feel better and take only as needed when you feel you need them.      Without  this process, it simply isn't possible to assure that we are providing  your outpatient care  with  the attention to detail we feel you deserve.   If we cannot assure that you're getting that kind of care,  then we cannot manage your problem effectively from this clinic. Add apparently never too singulair and had a f/u CT sinus > consider next ov if not better

## 2015-02-23 NOTE — Progress Notes (Signed)
Subjective:    Patient ID: [name] [name], female    DOB: 11/10/1935,    MRN: [id number]    Brief patient profile:  [id number]  never smoker with CAD for FU of reactive airway disease - trigger ? GERD & mild AS / with completely nl lung function during flare 09/30/2014   10/29/09 - initial  This was of insidious onset - - She had an ER visit during a trip to [place] - echo nml LV fn, BNP 55, stress myoview nml, given cipro for 'bronchitis.'  CXR 08/10/09 shows bibasal interstitial prominence, lt volume loss with raised hemidiaphragm  CT angio chest 01/2008 was neg for PE, stable RUL 78m nodule as compared to 05/2005, no obvious fibrosis. CT sinuses 8/11 nml  She reports intermittent heartburn with protonix , had an EGD by [name] [name] 11/10  PFTs in 03/2009 were essentially nml, no obstruction, nml lung volumes & diffusion.  [facility] Spirometry 6/11 shows nml lung function, RAST neg, Labs showed neg ANA, and low ESR. RA factor was elevated.  ER visit on 01/09/10 - absolute eos count 800 ,BNP 69, CXR - cardiomegaly, was on prednisone .  Sep, 2011 >> Referred to Rheum ([name] [name] - favors OA rather than RA, CCP neg, CK was high 709 - muscle cramps, crestor stopped Stopped advair x 2 weeks - 'ran out', beta blockers stopped     01/10/14 [name] [name] eval  Key is to keep reflux under control Stay on protonix daytime & pepcid at night Keep appt with [name] MCollene MaresPrednisone 10 mg tabs  Take 2 tabs daily with food x 7ds, then 1 tab daily with food x 7ds then STOP  # 40 Stay on symbicort Albuterol MDI 2 puffs upto 4 times daily as needed for wheezing Start singulair 10 mg at bedtime> never started it    03/31/2014 f/u ov/[name] [name] re: ? Asthma  Chief Complaint  Patient presents with  . Follow-up    Pt states that her breathing is doing better since the last visit. She states does not have rescue inhaler, and has neb but has not needed since last visit.   maintaining protonix 40 mg  ac and pepcid at bedtime but easily confused with names of meds and instructions rec Continue symbicort 160 Take 2 puffs first thing in am and then another 2 puffs about 12 hours later.  Work on inhaler technique:    Only use your albuterol as a rescue medication  Stop cozar and take diovan (valsartan) 160 mg one daily          04/28/2014 f/u ov/[name] [name] re: cough/ throat clearing/ ? Pseudoasthma Chief Complaint  Patient presents with  . Follow-up    Pt states that her breathing seems to be doing better since her last visit. No wheezing, cough or need for rescue inhaler or neb.   symbicort 160 2bid and no need rescue at all, Not limited by breathing from desired activities  rec When you finish the symbicort 160 switch to the 80 Take 2 puffs first thing in am and then another 2 puffs about 12 hours later.   06/09/2014 f/u ov/[name] [name] re: asthma vs uacs no longer on any inhalers at all x one month and doing well  Chief Complaint  Patient presents with  . Follow-up    Pt stats that her breathing is doing great. She has not had to use rescue inhaler since the last visit. She states since she started  on valsartan end of Oct 2015 she has noticed joint pain and loss of appetite.   Not limited by breathing from desired activities   rec F/u prn    09/30/2014 acute ov/[name] [name] re: cough/ out of all inhalers x weeks/ took 2 pred pill one week prior to OV  Left over  Chief Complaint  Patient presents with  . Acute Visit    Pt c/o increased cough for the past wk. She states cough is non prod, feels like mucus gets stuck in her throat. She has not had to use proair.   again easily confused with meds / not really clear what she's taking Not limited by breathing from desired activities  But by R back/hip pain  rec Pantoprazole (protonix) 40 mg   Take 30-60 min before first meal of the day and(famotidine)  Pepcid ac 20 mg one bedtime until return to office - this is the best way to tell whether stomach acid  is contributing to your problem.   Prednisone 10 mg take  4 each am x 2 days,   2 each am x 2 days,  1 each am x 2 days and stop  For drainage take chlortrimeton (chlorpheniramine) 4 mg every 4 hours available over the counter (may cause drowsiness)  GERD diet  Try off  coQ 10 x 2 weeks and just take Crestor one half daily  and if cramps still bad stop crestor too  If still cough take hydromet 1-2 tsp every 4 hours as needed If breathing problems > proair up to 2 pffs every 4 hours as needed only if stop the coughing first  See me in  2 weeks with all your medications, even over the counter meds, separated in two separate bags, the ones you take no matter what vs the ones you stop once you feel better and take only as needed when you feel you need them Late add:  Do allergy profile/ repeat sinus ct next and consider adding neurontin once we've achieved med rec          10/16/2014 f/u ov/[name] [name] re: chronic cough improved off all inhalers  Chief Complaint  Patient presents with  . Follow-up    Pt states cough has resolved. She has been sneezing alot- started day after last visit. She also c/o rhinitis and HA.   Did not follow any of the instructions except for GERD but much better with just diet - then started having nasal congestion/ runny nose/ day of ov but still "not coughing but feel like something stuck in my throat" rec Pantoprazole (protonix) 40 mg   Take 30-60 min before first meal of the day and(famotidine)  Pepcid ac 20 mg one bedtime   For drainage take chlortrimeton (chlorpheniramine) 4 mg every 4 hours available over the counter (may cause drowsiness)  Stop  Crestor and co Q10 for now  If still cough take hydromet 1-2 tsp every 4 hours as needed If breathing problems > proair up to 2 pffs every 4 hours as needed only if stop the coughing first  See  [name] [name] with all your active medicines with you  Please remember to go to the lab  department downstairs for your tests - we will  call you with the results when they are available. Pulmonary follow up is as needed       02/23/2015  Acute  Extended ov/Clarke Amburn re:  Recurrent cough / totally confused with meds  Chief Complaint  Patient presents with  .  Acute Visit    Pt c/o increased cough and wheezing for the past month. Cough is prod with thick, clear sputum.     Cough gradually worse x 2 m,  In ER 02/04/15 with cp/ nausea/ not clear at all what meds she actually takes, has not been back to [name] [name] with all meds in hand and refuses to see our NP for med calendar    No obvious patterns in day to day or daytime variabilty or assoc excess/ purulent sputum or  cp or chest tightness,  overt    hb symptoms. No unusual exp hx or h/o childhood pna/ asthma or knowledge of premature birth.  Sleeping ok without nocturnal  or early am exacerbation  of respiratory  c/o's or need for noct saba. Also denies any obvious fluctuation of symptoms with weather or environmental changes or other aggravating or alleviating factors except as outlined above   Current Medications, Allergies, Complete Past Medical History, Past Surgical History, Family History, and Social History were reviewed in [employer] record.    ROS  The following are not active complaints unless bolded sore throat, dysphagia, dental problems, itching, sneezing,  nasal congestion or excess/ purulent secretions, ear ache,   fever, chills, sweats, unintended wt loss, pleuritic or exertional cp, hemoptysis,  orthopnea pnd or leg swelling, presyncope, palpitations, heartburn, abdominal pain, anorexia, nausea, vomiting, diarrhea  or change in bowel or urinary habits, change in stools or urine, dysuria,hematuria,  rash, arthralgias/ cramps/aches in leg muscles bilaterally> not clear she ever tried off crestor as rec  , visual complaints, headache, numbness weakness or ataxia or problems with walking or coordination,  change in mood/affect or memory.            Objective:  Physical Exam  amb bf nad with harsh upper airway coughing fits   04/28/2014      183 >  06/09/13 178 > 09/30/2014  181> 10/16/2014   182 > 02/23/2015  188  Wt Readings from Last 3 Encounters:  03/31/14 187 lb (84.823 kg)  02/17/14 187 lb (84.823 kg)  01/10/14 188 lb 6.4 oz (85.458 kg)     Vital signs reviewed  amb bf nad /freq throat clearing and prominent  pseudowheeeze       HEENT: nl dentition, turbinates, and orophanx. Nl external ear canals without cough reflex   NECK :  without JVD/Nodes/TM/ nl carotid upstrokes bilaterally   LUNGS: no acc muscle use, clear to A and P bilaterally without cough on insp or exp maneuvers   CV:  RRR  no s3,  II/IV SEM  no increase in P2, no edema   ABD:  soft and nontender with nl excursion in the supine position. No bruits or organomegaly, bowel sounds nl  MS:  warm without deformities, calf tenderness, cyanosis or clubbing    I personally reviewed images and agree with radiology impression as follows:  CXR:  02/04/15 Mild cardiomegaly. No active lung disease.                         Assessment & Plan:

## 2015-02-24 ENCOUNTER — Telehealth: Payer: Self-pay

## 2015-02-24 ENCOUNTER — Other Ambulatory Visit: Payer: Self-pay

## 2015-02-24 MED ORDER — CRESTOR 20 MG PO TABS: 20.0000 mg | ORAL_TABLET | Freq: Every day | ORAL | Status: DC

## 2015-02-24 NOTE — Assessment & Plan Note (Addendum)
-   CT head 04/20/14 Diffuse mucoperiosteal thickening and partial opacification of the visualized ethmoid air cells. Small air-fluid level in the right sphenoid air cell. Findings suggest inflammatory paranasal sinus disease.   - spirometry wnl 09/30/2014 during flare of cough  - allergy profile 10/16/14 >  Eos 0.3, IgE  18 with neg RAST  Of the three most common causes of chronic cough, only one (GERD)  can actually cause the other two (asthma and post nasal drip syndrome)  and perpetuate the cylce of cough inducing airway trauma, inflammation, heightened sensitivity to reflux which is prompted by the cough itself via a cyclical mechanism.    This may partially respond to steroids and look like asthma and post nasal drainage but never erradicated completely unless the cough and the secondary reflux are eliminated, preferably both at the same time.  While not intuitively obvious, many patients with chronic low grade reflux do not cough until there is a secondary insult that disturbs the protective epithelial barrier and exposes sensitive nerve endings.  This can be viral or direct physical injury such as with an endotracheal tube.   The point is that once this occurs, it is difficult to eliminate using anything but a maximally effective acid suppression regimen at least in the short run, accompanied by an appropriate diet to address non acid GERD.   apparently never too singulair and had a f/u CT sinus > consider next ov if not better on neurontin 100 mg tid   I had an extended discussion with the patient reviewing all relevant studies completed to date and  lasting 25 minutes of a 40 minute visit    1)  I reviewed with the patient the process we employ  here of optimizing symptoms using a minimum number of medications using serial follow up visits to achieve the best outcome; however, this does require  a buy in / commitment on the pt's part to achieve and maintain accurate medication reconciliation  and to take this process as seriously as we do or we are unlikely to achieve the stated goal.   2) Until we are sure the patient is doing what we asking them to do it makes no sense to ask them to do more> so rec f/u in 2 weeks with all meds in hand  3) Each maintenance medication was reviewed in detail including most importantly the difference between maintenance and prns and under what circumstances the prns are to be triggered using an action plan format that is not reflected in the computer generated alphabetically organized AVS.    Please see instructions for details which were reviewed in writing and the patient given a copy highlighting the part that I personally wrote and discussed at today's ov.

## 2015-02-24 NOTE — Telephone Encounter (Signed)
Spoke with a Rep at [facility] re: Crestor 20mg  Tier Reduction. After review they will support this request. Her cost will be $75.00 locally or $188.00 for a 90 day supply. Effective 02/02/2015 through 02/23/2016. She will receive a letter to this effect as well.

## 2015-02-24 NOTE — Assessment & Plan Note (Signed)
Body mass index is 35.5    Lab Results  Component Value Date   TSH 2.18 06/09/2014     Contributing to gerd tendency/ doe/reviewed the need and the process to achieve and maintain neg calorie balance > defer f/u primary care including intermittently monitoring thyroid status

## 2015-02-25 ENCOUNTER — Telehealth: Payer: Self-pay | Admitting: Cardiology

## 2015-02-25 NOTE — Telephone Encounter (Signed)
New Message  Pt needs pt needs authorization   Please provide a Physician DAW   And call Please call  [phone]

## 2015-02-27 ENCOUNTER — Other Ambulatory Visit: Payer: Self-pay

## 2015-02-27 NOTE — Telephone Encounter (Signed)
Already sent in her Rx for Crestor 20mg  NAME BRAND ONLY, on 02/24/2015

## 2015-03-09 ENCOUNTER — Ambulatory Visit (INDEPENDENT_AMBULATORY_CARE_PROVIDER_SITE_OTHER): Payer: Medicare Other | Admitting: Internal Medicine

## 2015-03-09 ENCOUNTER — Encounter: Payer: Self-pay | Admitting: Internal Medicine

## 2015-03-09 VITALS — BP 112/78 | HR 96 | Ht 61.0 in | Wt 191.6 lb

## 2015-03-09 DIAGNOSIS — J45991 Cough variant asthma: Secondary | ICD-10-CM | POA: Diagnosis not present

## 2015-03-09 DIAGNOSIS — R05 Cough: Secondary | ICD-10-CM | POA: Diagnosis not present

## 2015-03-09 DIAGNOSIS — R058 Other specified cough: Secondary | ICD-10-CM

## 2015-03-09 DIAGNOSIS — E669 Obesity, unspecified: Secondary | ICD-10-CM

## 2015-03-09 MED ORDER — PREDNISONE 10 MG PO TABS: ORAL_TABLET | ORAL | Status: DC

## 2015-03-09 MED ORDER — MOMETASONE FURO-FORMOTEROL FUM 100-5 MCG/ACT IN AERO: INHALATION_SPRAY | RESPIRATORY_TRACT | Status: DC

## 2015-03-09 NOTE — Patient Instructions (Addendum)
Pantoprazole (protonix) 40 mg   Take  30-60 min before first meal of the day and Pepcid (famotidine)  20 mg one @  bedtime until return to office - this is the best way to tell whether stomach acid is contributing to your problem.    Prednisone 10 mg take  4 each am x 2 days,   2 each am x 2 days,  1 each am x 2 days and stop   Gabapentin 100 mg three times daily   dulera 100  Take 2 puffs first thing in am and then another 2 puffs about 12 hours later.   For short of breath/ wheeze > albuterol neb every 4 hours as needed   For drainage take chlortrimeton (chlorpheniramine) 4 mg every 4 hours available over the counter (may cause drowsiness)  Take delsym two tsp every 12 hours and supplement if needed with  tramadol 50 mg up to 2 every 4 hours to suppress the urge to cough. Swallowing water or using ice chips/non mint and menthol containing candies (such as lifesavers or sugarless jolly ranchers) are also effective.  You should rest your voice and avoid activities that you know make you cough.  Once you have eliminated the cough for 3 straight days try reducing the tramadol first,  then the delsym as tolerated.       GERD (REFLUX)  is an extremely common cause of respiratory symptoms just like yours , many times with no obvious heartburn at all.    It can be treated with medication, but also with lifestyle changes including elevation of the head of your bed (ideally with 6 inch  bed blocks),  Smoking cessation, avoidance of late meals, excessive alcohol, and avoid fatty foods, chocolate, peppermint, colas, red wine, and acidic juices such as orange juice.  NO MINT OR MENTHOL PRODUCTS SO NO COUGH DROPS  USE SUGARLESS CANDY INSTEAD (Jolley ranchers or Stover's or Life Savers) or even ice chips will also do - the key is to swallow to prevent all throat clearing. NO OIL BASED VITAMINS - use powdered substitutes.    See me in 2 weeks with all your medications, even over the counter meds,  separated in two separate bags, the ones you take no matter what vs the ones you stop once you feel better and take only as needed when you feel you need them.   Bring the pillboxes too

## 2015-03-09 NOTE — Progress Notes (Signed)
Subjective:    Patient ID: [name] [name], female    DOB: 11/10/1935,    MRN: [id number]    Brief patient profile:  [id number]  never smoker with CAD for FU of reactive airway disease - trigger ? GERD & mild AS / with completely nl lung function during flare 09/30/2014   10/29/09 - initial  This was of insidious onset - - She had an ER visit during a trip to [place] - echo nml LV fn, BNP 55, stress myoview nml, given cipro for 'bronchitis.'  CXR 08/10/09 shows bibasal interstitial prominence, lt volume loss with raised hemidiaphragm  CT angio chest 01/2008 was neg for PE, stable RUL 78m nodule as compared to 05/2005, no obvious fibrosis. CT sinuses 8/11 nml  She reports intermittent heartburn with protonix , had an EGD by Dr MCollene Maresin 11/10  PFTs in 03/2009 were essentially nml, no obstruction, nml lung volumes & diffusion.  [facility] Spirometry 6/11 shows nml lung function, RAST neg, Labs showed neg ANA, and low ESR. RA factor was elevated.  ER visit on 01/09/10 - absolute eos count 800 ,BNP 69, CXR - cardiomegaly, was on prednisone .  Sep, 2011 >> Referred to Rheum (Ouida Sills - favors OA rather than RA, CCP neg, CK was high 709 - muscle cramps, crestor stopped Stopped advair x 2 weeks - 'ran out', beta blockers stopped     01/10/14 [name] eval  Key is to keep reflux under control Stay on protonix daytime & pepcid at night Keep appt with Dr MCollene MaresPrednisone 10 mg tabs  Take 2 tabs daily with food x 7ds, then 1 tab daily with food x 7ds then STOP  # 40 Stay on symbicort Albuterol MDI 2 puffs upto 4 times daily as needed for wheezing Start singulair 10 mg at bedtime> never started it    03/31/2014 f/u ov/ re: ? Asthma  Chief Complaint  Patient presents with  . Follow-up    Pt states that her breathing is doing better since the last visit. She states does not have rescue inhaler, and has neb but has not needed since last visit.   maintaining protonix 40 mg  ac and pepcid at bedtime but easily confused with names of meds and instructions rec Continue symbicort 160 Take 2 puffs first thing in am and then another 2 puffs about 12 hours later.  Work on inhaler technique:    Only use your albuterol as a rescue medication  Stop cozar and take diovan (valsartan) 160 mg one daily          04/28/2014 f/u ov/ re: cough/ throat clearing/ ? Pseudoasthma Chief Complaint  Patient presents with  . Follow-up    Pt states that her breathing seems to be doing better since her last visit. No wheezing, cough or need for rescue inhaler or neb.   symbicort 160 2bid and no need rescue at all, Not limited by breathing from desired activities  rec When you finish the symbicort 160 switch to the 80 Take 2 puffs first thing in am and then another 2 puffs about 12 hours later.   06/09/2014 f/u ov/ re: asthma vs uacs no longer on any inhalers at all x one month and doing well  Chief Complaint  Patient presents with  . Follow-up    Pt stats that her breathing is doing great. She has not had to use rescue inhaler since the last visit. She states since she started  on valsartan end of Oct 2015 she has noticed joint pain and loss of appetite.   Not limited by breathing from desired activities   rec F/u prn    09/30/2014 acute ov/Sondra Blixt re: cough/ out of all inhalers x weeks/ took 2 pred pill one week prior to OV  Left over  Chief Complaint  Patient presents with  . Acute Visit    Pt c/o increased cough for the past wk. She states cough is non prod, feels like mucus gets stuck in her throat. She has not had to use proair.   again easily confused with meds / not really clear what she's taking Not limited by breathing from desired activities  But by R back/hip pain  rec Pantoprazole (protonix) 40 mg   Take 30-60 min before first meal of the day and(famotidine)  Pepcid ac 20 mg one bedtime until return to office - this is the best way to tell whether stomach acid  is contributing to your problem.   Prednisone 10 mg take  4 each am x 2 days,   2 each am x 2 days,  1 each am x 2 days and stop  For drainage take chlortrimeton (chlorpheniramine) 4 mg every 4 hours available over the counter (may cause drowsiness)  GERD diet  Try off  coQ 10 x 2 weeks and just take Crestor one half daily  and if cramps still bad stop crestor too  If still cough take hydromet 1-2 tsp every 4 hours as needed If breathing problems > proair up to 2 pffs every 4 hours as needed only if stop the coughing first  See me in  2 weeks with all your medications, even over the counter meds, separated in two separate bags, the ones you take no matter what vs the ones you stop once you feel better and take only as needed when you feel you need them Late add:  Do allergy profile/ repeat sinus ct next and consider adding neurontin once we've achieved med rec          10/16/2014 f/u ov/Ameisha Mcclellan re: chronic cough improved off all inhalers  Chief Complaint  Patient presents with  . Follow-up    Pt states cough has resolved. She has been sneezing alot- started day after last visit. She also c/o rhinitis and HA.   Did not follow any of the instructions except for GERD but much better with just diet - then started having nasal congestion/ runny nose/ day of ov but still "not coughing but feel like something stuck in my throat" rec Pantoprazole (protonix) 40 mg   Take 30-60 min before first meal of the day and(famotidine)  Pepcid ac 20 mg one bedtime   For drainage take chlortrimeton (chlorpheniramine) 4 mg every 4 hours available over the counter (may cause drowsiness)  Stop  Crestor and co Q10 for now  If still cough take hydromet 1-2 tsp every 4 hours as needed If breathing problems > proair up to 2 pffs every 4 hours as needed only if stop the coughing first  See  Dr Philip Aspen with all your active medicines with you  Please remember to go to the lab  department downstairs for your tests - we will  call you with the results when they are available. Pulmonary follow up is as needed       02/23/2015  Acute  Extended ov/Kamren Heskett re:  Recurrent cough / totally confused with meds  Chief Complaint  Patient presents with  .  Acute Visit    Pt c/o increased cough and wheezing for the past month. Cough is prod with thick, clear sputum.     Cough gradually worse x 2 m,  In ER 02/04/15 with cp/ nausea/ not clear at all what meds she actually takes, has not been back to Dr Philip Aspen with all meds in hand and refuses to see our NP for med calendar  rec Pantoprazole (protonix) 40 mg   Take  30-60 min before first meal of the day and Pepcid (famotidine)  20 mg one @  bedtime until return to office - this is the best way to tell whether stomach acid is contributing to your problem.   Prednisone 10 mg take  4 each am x 2 days,   2 each am x 2 days,  1 each am x 2 days and stop  Gabapentin 100 mg three times daily  For drainage take chlortrimeton (chlorpheniramine) 4 mg every 4 hours available over the counter (may cause drowsiness) Take delsym two tsp every 12 hours and supplement if needed with  tramadol 50 mg up to 2 every 4 hours to suppress the urge to cough. Swallowing water or using ice chips/non mint and menthol containing candies (such as lifesavers or sugarless jolly ranchers) are also effective.  You should rest your voice and avoid activities that you know make you cough. Once you have eliminated the cough for 3 straight days try reducing the tramadol first,  then the delsym as tolerated.     GERD (REFLUX)  is an extremely    03/09/2015  Extended f/u ov/ re: chronic cough with flare x  12/2014   Chief Complaint  Patient presents with  . Follow-up    C/o increase SOB, prod cough (white phlem), wheezing, chest tx.   Never got pred, never called to let us know the pharmacy did not receive rx  , never got h1, using spearmint/ not taking gabapentin or delsym as directed  Does respond temporarily  to saba neb and looking back she may have responded to ICS though hfa is poor  (see a/p)  In retrospect the urge to clear her throat is never completely resolved for years with the cough itself flaring since July 2016 and also previously when she was on Ace inhibitors  Cough is min productive with sense of wheeze/ flares at hs and during the night, early am   No obvious day to day or daytime variability or assoc chronic cough or cp or chest tightness, subjective wheeze or overt sinus or hb symptoms. No unusual exp hx or h/o childhood pna/ asthma or knowledge of premature birth.  Sleeping ok without nocturnal  or early am exacerbation  of respiratory  c/o's or need for noct saba. Also denies any obvious fluctuation of symptoms with weather or environmental changes or other aggravating or alleviating factors except as outlined above   Current Medications, Allergies, Complete Past Medical History, Past Surgical History, Family History, and Social History were reviewed in [employer] record.     ROS  The following are not active complaints unless bolded sore throat, dysphagia, dental problems, itching, sneezing,  nasal congestion or excess/ purulent secretions, ear ache,   fever, chills, sweats, unintended wt loss, pleuritic or exertional cp, hemoptysis,  orthopnea pnd or leg swelling, presyncope, palpitations, heartburn, abdominal pain, anorexia, nausea, vomiting, diarrhea  or change in bowel or urinary habits, change in stools or urine, dysuria,hematuria,  rash, arthralgias/ cramps/aches in leg muscles bilaterally>  not clear she ever tried off crestor as rec > f/u Devashwar 10/4  , visual complaints, headache, numbness weakness or ataxia or problems with walking or coordination,  change in mood/affect or memory.           Objective:  Physical Exam  amb bf nad with harsh upper airway coughing fits   04/28/2014      183 >  06/09/13 178 > 09/30/2014  181> 10/16/2014   182 >  02/23/2015  188 > 03/09/2015   192  Wt Readings from Last 3 Encounters:  03/31/14 187 lb (84.823 kg)  02/17/14 187 lb (84.823 kg)  01/10/14 188 lb 6.4 oz (85.458 kg)     Vital signs reviewed  amb bf nad /freq throat clearing and prominent  pseudowheeeze       HEENT: full upper, lower partial dentures, turbinates, and orophanx. Nl external ear canals without cough reflex   NECK :  without JVD/Nodes/TM/ nl carotid upstrokes bilaterally   LUNGS: no acc muscle use, clear to A and P bilaterally without cough on insp or exp maneuvers   CV:  RRR  no s3,  II/IV SEM  no increase in P2, no edema   ABD:  soft and nontender with nl excursion in the supine position. No bruits or organomegaly, bowel sounds nl  MS:  warm without deformities, calf tenderness, cyanosis or clubbing    I personally reviewed images and agree with radiology impression as follows:  CXR:  02/04/15 Mild cardiomegaly. No active lung disease.               Assessment & Plan:

## 2015-03-10 ENCOUNTER — Encounter: Payer: Self-pay | Admitting: Internal Medicine

## 2015-03-10 NOTE — Assessment & Plan Note (Signed)
PFTs in 03/2009 were essentially nml, no obstruction, nml lung volumes & diffusion - improved symptoms p stopped losartan 04/01/14 - 04/28/2014 p extensive coaching HFA effectiveness =    90%  - symptoms improved off all rx 10/16/14 so rec prn saba only  - symptoms flared over the summer of 2016 so 03/09/2015  >> rec restart sulera 100 2 every 12h  - 03/09/2015  extensive coaching HFA effectiveness =    25% > try dulera 100 2bid   Looking back over her record it appears that she progressed on Symbicort 80 every 12 hours with she might have a component of cough variant asthma and reasonable to at least try low doses of Dulera as well as a short course of prednisone

## 2015-03-10 NOTE — Assessment & Plan Note (Addendum)
-   CT head 04/20/14 Diffuse mucoperiosteal thickening and partial opacification of the visualized ethmoid air cells. Small air-fluid level in the right sphenoid air cell. Findings suggest inflammatory paranasal sinus disease.   - spirometry wnl 09/30/2014 during flare of cough  - allergy profile 10/16/14 >  Eos 0.3, IgE  18 with neg RAST  She is no better but she is also not following most of the instructions that she was given on her previous visit  I had an extended discussion with the patient reviewing all relevant studies completed to date and  lasting 25 minutes of a 40 minute visit on the following ongoing concerns:   The standardized cough guidelines published in Chest by [place] in 2006 are still the best available and consist of a multiple step process (up to 12!) , not a single office visit,  and are intended  to address this problem logically,  with an alogrithm dependent on response to empiric treatment at  each progressive step  to determine a specific diagnosis with  minimal addtional testing needed. Therefore if adherence is an issue or can't be accurately verified,  it's very unlikely the standard evaluation and treatment will be successful here.    Furthermore, response to therapy (other than acute cough suppression, which should only be used short term with avoidance of narcotic containing cough syrups if possible), can be a gradual process for which the patient may perceive immediate benefit.  Unlike going to an eye doctor where the best perscription is almost always the first one and is immediately effective, this is almost never the case in the management of chronic cough syndromes. Therefore the patient needs to commit up front to consistently adhere to recommendations  for up to 6 weeks of therapy directed at the likely underlying problem(s) before the response can be reasonably evaluated.  Another way of stating this is that the patient needs to do what we ask before we ask  her to do more and we need to use a trust but verify approach here.  For now try:  Taking her Neurontin perfectly consistently at 100 mg 3 times a day, avoid mint/ and use deslym plus prn tramadol to eliminate cyclical cough

## 2015-03-10 NOTE — Assessment & Plan Note (Addendum)
Body mass index is 36.22  - trending up  Lab Results  Component Value Date   TSH 2.18 06/09/2014     Contributing to gerd tendency/ doe/reviewed the need and the process to achieve and maintain neg calorie balance > defer f/u primary care including intermittently monitoring thyroid status

## 2015-03-20 ENCOUNTER — Encounter: Payer: Self-pay | Admitting: Cardiology

## 2015-03-20 ENCOUNTER — Ambulatory Visit (INDEPENDENT_AMBULATORY_CARE_PROVIDER_SITE_OTHER): Payer: Medicare Other | Admitting: Cardiology

## 2015-03-20 VITALS — BP 122/80 | HR 74 | Ht 61.0 in | Wt 189.1 lb

## 2015-03-20 DIAGNOSIS — I25118 Atherosclerotic heart disease of native coronary artery with other forms of angina pectoris: Secondary | ICD-10-CM

## 2015-03-20 DIAGNOSIS — E785 Hyperlipidemia, unspecified: Secondary | ICD-10-CM | POA: Diagnosis not present

## 2015-03-20 DIAGNOSIS — R058 Other specified cough: Secondary | ICD-10-CM

## 2015-03-20 DIAGNOSIS — R05 Cough: Secondary | ICD-10-CM

## 2015-03-20 MED ORDER — ISOSORBIDE MONONITRATE ER 30 MG PO TB24: 30.0000 mg | ORAL_TABLET | Freq: Every day | ORAL | Status: DC

## 2015-03-20 NOTE — Patient Instructions (Addendum)
Medication Instructions:  Your physician has recommended you make the following change in your medication: Start Isosorbide mononitrate 30 mg by mouth daily   Labwork: Your physician recommends that you return for fasting  lab work in:  January 2017--Scheduled for January 10,2017.  The lab opens at 7:30 AM   Testing/Procedures: none  Follow-Up: Your physician wants you to follow-up in: 6 months.  You will receive a reminder letter in the mail two months in advance. If you don't receive a letter, please call our office to schedule the follow-up appointment.   Any Other Special Instructions Will Be Listed Below (If Applicable).

## 2015-03-22 NOTE — Progress Notes (Signed)
Patient ID: [name] [name], female   DOB: [birth year], 79 y.o.   MRN: [id number] PCP: Eloise Harman  79 yo with history of CAD, asthma, and osteoarthritis presents for cardiology followup.  Patient had a long workup for bronchospasm with Dr. Vassie Loll.  It now appears that she is very sensitive to beta blockers, with bronchospasm/dyspnea with even the most beta-1 selective agents.  She is, therefore, no longer on a beta blocker.  Her aspirin was also stopped due to concern that it could have been contributing to bronchospasm.  Echo (3/15) showed EF 60-65%, with mild AS.   She had a bad chest pain episode 6 weeks or so ago and had Cardiolite in 9/16.  This showed a small, reversible apical inferior/apical perfusion defect.  This was not thought to be significantly different compared to the prior so she was medically managed.  She has had a long-term pattern where she will get a "pressing sensation" in her chest if she walks fast.  This will resolve with rest.  She still has this, no change.  Stable exertional dyspnea with stairs/hills.  No orthopnea/PND.  Overall seems to be stable symptomatically. Weight is up 11 lbs.     Labs (6/11): LDL 101, HDL 53 Labs (9/11): K 4.6, creatinine 0.9 Labs (7/12): LDL 82, HDL 57, LFTs normal Labs (0/16): K 3.7, creatinine 0.8 Labs (3/13): K 4.4, creatinine 1.1, LDL 58, HDL 41 Labs (9/13): LDL 63, HDL 43 Labs (2/14): K 4.3, creatinine 1.0 Labs (1/15): LDL 57, HDL 41 Labs (1/16): K 3.9, creatinine 0.8, TSH normal, HCT 40.7 Labs (8/16): K 3.9, creatinine 0.89  Allergies (verified):  1)  ! Pcn 2)  ! Biaxin 3)  ! * Pravastatin 4)  * Contrast Dye  Past Medical History: 1. Coronary artery disease.  Left heart catheterization was done in September 2009 because of exertional chest pain.  It showed an EF of 60% on left ventriculogram, RCA was totally occluded proximally with left-to-right collaterals, the distal left main had a 20% lesion, there was a small ramus with a 60-70%  stenosis, and the mid LAD had  serial 60% stenoses with a distal subtotalled LAD.  There was a 50% first diagonal stenosis and a 50% first OM stenosis. There were poor distal targets for bypass surgery and no good interventional target so medical management was planned. Myoview in [place] in 3/11 was normal per report. Lexiscan myoview (2/13): EF 67%, mild reversible inferior perfusion defect suggestive of mild ischemia.  Echo (3/15) with mild AS, EF 60-65%. Cardiolite (9/16) with small reversible apical inferior/apical perfusion defect, thought to be similar to prior.  2. Hypertension: amlodipine caused itching.  3. Peptic ulcer disease. 4. Hysterectomy. 5. History of  HBV, HCV 6. History of chronic cough.  ACEI stopped.  Possible contribution from GERD. 7. History of PVCs and PACs. 8. GERD 9. IBS 10. Depression 11. Hyperlipidemia: CPK elevated while on Crestor.  She thinks pravastatin caused a rash.  12. Obesity 13. h/o pancreatitis 14. Osteoporosis 15. Knee osteoarthritis s/p left TKR.  16. Dyspnea/cough: Echo (3/10): EF 55-60%, no regional wall motion abnormalities, aortic sclerosis.  Echo (3/11) in [place]: EF 60%, grade I diastolic dysfunction, normal RV, no regional wall motion abnormalities, valves are ok.  PFTs (10/10): FVC 103%, FEV1 109%, ratio 74%.  Mild obstruction.  CT chest (6/11) did not show evidence for interstitial lung disease.  Possible asthma or upper airways cough syndrome.  Aspirin and beta blocker have been held as potential causes for bronchospasm.  17. Asthma 18. Rheumatoid arthritis.  19. Aortic stenosis: Mild on 3/15 echo.   Family History: Family History Asthma-sister, mother Family History Emphysema-sister Family History Rheumatoid Arthritis-mother Family History Colon Cancer-maternal aunt Family History Lung Cancer-maternal aunt Pancreatic Cancer-brother Family History C V A / Stroke -mother  Social History: The patient does not drink or smoke  (never smoked).   Marital Status:Married lives with husband  Children: Yes Occupation: Retired [employer]  Patient never smoked. (2nd hand smoke exposure x 40 years)  ROS: All systems reviewed and negative except as per HPI  Current Outpatient Prescriptions  Medication Sig Dispense Refill  . albuterol (PROVENTIL) (2.5 MG/3ML) 0.083% nebulizer solution Take 2.5 mg by nebulization every 6 (six) hours as needed for wheezing or shortness of breath.    . clopidogrel (PLAVIX) 75 MG tablet Take 75 mg by mouth daily.     . CRESTOR 20 MG tablet Take 1 tablet (20 mg total) by mouth daily. 90 tablet 3  . famotidine (PEPCID) 20 MG tablet Take 20 mg by mouth at bedtime.    . gabapentin (NEURONTIN) 100 MG capsule Take 1 capsule (100 mg total) by mouth 3 (three) times daily. One three times daily 90 capsule 2  . mometasone-formoterol (DULERA) 100-5 MCG/ACT AERO Take 2 puffs first thing in am and then another 2 puffs about 12 hours later.    . nitroGLYCERIN (NITROSTAT) 0.4 MG SL tablet Place 0.4 mg under the tongue every 5 (five) minutes as needed for chest pain.     . pantoprazole (PROTONIX) 40 MG tablet 1 every am 30 min before first meal    . traMADol (ULTRAM) 50 MG tablet Take 50 mg by mouth 4 (four) times daily as needed for moderate pain.     . valsartan (DIOVAN) 160 MG tablet Take 1 tablet (160 mg total) by mouth daily. 90 tablet 0  . Vitamin D, Ergocalciferol, (DRISDOL) 50000 UNITS CAPS capsule Take 50,000 Units by mouth every 7 (seven) days.    . isosorbide mononitrate (IMDUR) 30 MG 24 hr tablet Take 1 tablet (30 mg total) by mouth daily. 30 tablet 11   No current facility-administered medications for this visit.   BP 122/80 mmHg  Pulse 74  Ht 5\' 1"  (1.549 m)  Wt 189 lb 1.9 oz (85.784 kg)  BMI 35.75 kg/m2  SpO2 96% General:  Well developed, well nourished, in no acute distress.  Obese.  Neck:  Neck supple, no JVD. No masses, thyromegaly or abnormal cervical nodes. Lungs:   CTAB.  Heart:  Non-displaced PMI, chest non-tender; regular rate and rhythm, S1, S2 without rubs or gallops. 2/6 early systolic ejection murmur at RUSB.  Carotid upstroke normal, no bruit.  Pedals normal pulses. No edema. Abdomen:  Bowel sounds positive; abdomen soft and non-tender without masses, organomegaly, or hernias noted. No hepatosplenomegaly. Extremities:  No clubbing or cyanosis. Neurologic:  Alert and oriented x 3. Psych:  Normal affect.  Assessment/Plan:  1. CAD: Patient has extensive CAD but no good options for revascularization. Last cath showed occluded RCA with left to right collaterals. Cardiolite in 9/16 showed apical inferior ischemia, but this was not thought to be significantly different compared to prior.  She has chronic mild chest pressure with walking fast.   - Continue Plavix, Crestor, ARB.  Suspected to get bonchospasm with aspirin and beta blockers.  - I will have her start Imdur 30 mg daily for her chronic angina.   2. Hyperlipidemia:  Check lipids in 1/17, continue Crestor.  3. HTN:  BP ok currently.  4. Asthma/upper airways cough syndrome: Follows with Dr Sherene Sires.   Marca Ancona 03/22/2015

## 2015-03-23 ENCOUNTER — Ambulatory Visit: Payer: Medicare Other | Admitting: Internal Medicine

## 2015-03-26 ENCOUNTER — Ambulatory Visit: Payer: Medicare Other | Admitting: Internal Medicine

## 2015-04-09 ENCOUNTER — Ambulatory Visit: Payer: Medicare Other | Admitting: Cardiology

## 2015-04-25 ENCOUNTER — Ambulatory Visit (INDEPENDENT_AMBULATORY_CARE_PROVIDER_SITE_OTHER): Payer: Medicare Other | Admitting: Physician Assistant

## 2015-04-25 VITALS — BP 120/72 | HR 74 | Temp 98.7°F | Resp 18 | Ht 62.0 in | Wt 183.2 lb

## 2015-04-25 DIAGNOSIS — N9489 Other specified conditions associated with female genital organs and menstrual cycle: Secondary | ICD-10-CM

## 2015-04-25 DIAGNOSIS — M5441 Lumbago with sciatica, right side: Secondary | ICD-10-CM | POA: Diagnosis not present

## 2015-04-25 DIAGNOSIS — R10819 Abdominal tenderness, unspecified site: Secondary | ICD-10-CM | POA: Diagnosis not present

## 2015-04-25 DIAGNOSIS — R102 Pelvic and perineal pain: Secondary | ICD-10-CM

## 2015-04-25 LAB — POCT URINALYSIS DIP (MANUAL ENTRY)
Bilirubin, UA: NEGATIVE
Glucose, UA: NEGATIVE
Ketones, POC UA: NEGATIVE
Leukocytes, UA: NEGATIVE
Nitrite, UA: NEGATIVE
Protein Ur, POC: NEGATIVE
Spec Grav, UA: 1.01
Urobilinogen, UA: 0.2
pH, UA: 5.5

## 2015-04-25 LAB — POC MICROSCOPIC URINALYSIS (UMFC)

## 2015-04-25 MED ORDER — HYDROCODONE-ACETAMINOPHEN 5-325 MG PO TABS: 1.0000 | ORAL_TABLET | Freq: Three times a day (TID) | ORAL | Status: DC | PRN

## 2015-04-25 NOTE — Progress Notes (Signed)
Subjective:   Patient ID: [name] [name], female     DOB: 07-Nov-1935, 79 y.o.    MRN: [id number]  PCP: Jarome Matin  Chief Complaint  Patient presents with  . Back Pain    lower back pain, spreading to legs. x2 weeks    HPI  Presents for evaluation of RIGHT low back pain radiating into the RIGHT thigh.  Diagnosed with sciatica by Dr. Charlett Blake a couple of years ago. Previously received steroid injections that would help for several months. Since then, Dr. Jerl Santos has given a different injection in the hips, but without benefit and caused her to feel sick. Most recent injection was yesterday. Tramadol isn't helping. She got in the hot tub yesterday to see if that would help, but didn't get much relief. Now she also reports soreness in the lower belly, like she may have a UTI. No urinary urgency, frequency, burning with urination or hematuria. No personal history of diabetes.  No LE weakness. No loss of bowel or bladder control. No saddle anesthesia.  Prior to Admission medications   Medication Sig Start Date End Date Taking? Authorizing Provider  albuterol (PROVENTIL) (2.5 MG/3ML) 0.083% nebulizer solution Take 2.5 mg by nebulization every 6 (six) hours as needed for wheezing or shortness of breath.   Yes Historical Provider, MD  clopidogrel (PLAVIX) 75 MG tablet Take 75 mg by mouth daily.  11/25/14  Yes Historical Provider, MD  CRESTOR 20 MG tablet Take 1 tablet (20 mg total) by mouth daily. 02/24/15  Yes Laurey Morale, MD  famotidine (PEPCID) 20 MG tablet Take 20 mg by mouth at bedtime.   Yes Historical Provider, MD  gabapentin (NEURONTIN) 100 MG capsule Take 1 capsule (100 mg total) by mouth 3 (three) times daily. One three times daily 02/23/15  Yes Nyoka Cowden, MD  isosorbide mononitrate (IMDUR) 30 MG 24 hr tablet Take 1 tablet (30 mg total) by mouth daily. 03/20/15  Yes Laurey Morale, MD  mometasone-formoterol [facility]) 100-5 MCG/ACT AERO Take 2 puffs first thing in am and  then another 2 puffs about 12 hours later. 03/09/15  Yes Nyoka Cowden, MD  nitroGLYCERIN (NITROSTAT) 0.4 MG SL tablet Place 0.4 mg under the tongue every 5 (five) minutes as needed for chest pain.    Yes Historical Provider, MD  pantoprazole (PROTONIX) 40 MG tablet 1 every am 30 min before first meal   Yes Laurey Morale, MD  valsartan (DIOVAN) 160 MG tablet Take 1 tablet (160 mg total) by mouth daily. 02/04/15  Yes Nyoka Cowden, MD  Vitamin D, Ergocalciferol, (DRISDOL) 50000 UNITS CAPS capsule Take 50,000 Units by mouth every 7 (seven) days.   Yes Historical Provider, MD  traMADol (ULTRAM) 50 MG tablet Take 50 mg by mouth 4 (four) times daily as needed for moderate pain.     Historical Provider, MD     Allergies  Allergen Reactions  . Clarithromycin Other (See Comments)    unknown  . Doxycycline Nausea Only  . Pravastatin Itching    REACTION: itching  . Levaquin [Levofloxacin In D5w] Rash    Rash, HA  . Penicillins Itching and Rash     Patient Active Problem List   Diagnosis Date Noted  . CAD (coronary artery disease)   . HLD (hyperlipidemia)   . Obesity   . Dyspnea 12/23/2013  . Sleep apnea   . PVC (premature ventricular contraction)   . Rheumatoid arthritis(714.0) 12/29/2011  . Shoulder pain 08/29/2011  . Cough  variant asthma 12/10/2009  . Upper airway cough syndrome 02/13/2009  . MURMUR 08/21/2008  . CAD, NATIVE VESSEL 05/19/2008  . KNEE PAIN 05/26/2007  . HEPATITIS C 02/06/2007  . Hyperlipidemia 02/06/2007  . DEPRESSION 02/06/2007  . Essential hypertension 02/06/2007  . IRRITABLE BOWEL SYNDROME 02/06/2007  . OSTEOPOROSIS 02/06/2007  . PANCREATITIS, HX OF 02/06/2007  . ANXIETY 12/22/2006  . GERD 12/22/2006  . HEPATITIS B, HX OF 12/22/2006     Family History  Problem Relation Age of Onset  . Asthma Mother   . Arthritis Mother     rheumatiod arthritis  . Stroke Mother   . Asthma Sister   . Stroke Sister   . COPD Sister   . Emphysema Sister   . Colon  cancer Maternal Aunt   . Lung cancer Maternal Aunt   . Pancreatic cancer Brother      Social History   Social History  . Marital Status: Married    Spouse Name: Lollie Sails  . Number of Children: 4  . Years of Education: 12   Occupational History  . retired      [employer]   Social History Main Topics  . Smoking status: Never Smoker   . Smokeless tobacco: Never Used     Comment: 2nd hand exposure x40 years   . Alcohol Use: No  . Drug Use: No  . Sexual Activity: Not Currently   Other Topics Concern  . Not on file   Social History Narrative   Married, lives with husband; has children.   Retired Government social research officer.       Cell # [phone]; okay to leave a message.               Review of Systems  Constitutional: Negative for fever and chills.  Cardiovascular: Negative for chest pain, palpitations and leg swelling.  Gastrointestinal: Positive for nausea and abdominal pain (low pelvis "soreness"). Negative for vomiting and constipation.  Endocrine: Negative for polydipsia, polyphagia and polyuria.  Genitourinary: Negative for dysuria, urgency, frequency, hematuria and difficulty urinating.  Musculoskeletal: Positive for myalgias and back pain.  Neurological: Negative for weakness and numbness.         Objective:  Physical Exam  Constitutional: She is oriented to person, place, and time. She appears well-developed and well-nourished. No distress.  BP 120/72 mmHg  Pulse 74  Temp(Src) 98.7 F (37.1 C) (Oral)  Resp 18  Ht 5\' 2"  (1.575 m)  Wt 183 lb 3.2 oz (83.099 kg)  BMI 33.50 kg/m2  SpO2 96%   Cardiovascular: Normal rate.   Pulmonary/Chest: Effort normal.  Musculoskeletal:       Lumbar back: She exhibits decreased range of motion, tenderness and pain. She exhibits no bony tenderness, no swelling, no edema, no deformity, no laceration and no spasm.       Back:       Legs: Neurological: She is alert and oriented to person, place, and time.    Skin: Skin is warm and dry.         Results for orders placed or performed in visit on 04/25/15  POCT urinalysis dipstick  Result Value Ref Range   Color, UA yellow yellow   Clarity, UA clear clear   Glucose, UA negative negative   Bilirubin, UA negative negative   Ketones, POC UA negative negative   Spec Grav, UA 1.010    Blood, UA trace-lysed (A) negative   pH, UA 5.5    Protein Ur, POC negative negative  Urobilinogen, UA 0.2    Nitrite, UA Negative Negative   Leukocytes, UA Negative Negative  POCT Microscopic Urinalysis ([facility])  Result Value Ref Range   WBC,UR,HPF,POC None None WBC/hpf   RBC,UR,HPF,POC None None RBC/hpf   Bacteria Few (A) None, Too numerous to count   Mucus Present (A) Absent   Epithelial Cells, UR Per Microscopy Few (A) None, Too numerous to count cells/hpf       Assessment & Plan:  1. Right-sided low back pain with right-sided sciatica Unclear what injection she received yesterday-most likely a steroid. Tramadol not effective. Elect to avoid other NSAIDS given her history of PUD and GERD and because of her Plavix use for CAD. Short course of hydrocodone. Ice application to the RIGHT buttock area. Follow-up with Dr. Jerl Santos if symptoms persist. - HYDROcodone-acetaminophen (NORCO) 5-325 MG tablet; Take 1 tablet by mouth every 8 (eight) hours as needed for severe pain.  Dispense: 15 tablet; Refill: 0  2. Pelvic pressure in female May have an early UTI. Await UCx. - POCT urinalysis dipstick - POCT Microscopic Urinalysis ([facility]) - Urine culture   Fernande Bras, PA-C Physician Assistant-Certified [facility]

## 2015-04-25 NOTE — Patient Instructions (Signed)
Apply ice to the low back to help reduce the inflammation of the sciatic nerve. Try the hydrocodone to reduce the pain, but take it with food to prevent nausea.  It can also cause constipation and sleepiness, so taking a stool softener and avoiding driving are good ideas.

## 2015-04-27 LAB — URINE CULTURE
Colony Count: NO GROWTH
Organism ID, Bacteria: NO GROWTH

## 2015-05-18 ENCOUNTER — Ambulatory Visit (INDEPENDENT_AMBULATORY_CARE_PROVIDER_SITE_OTHER): Payer: Medicare Other | Admitting: Internal Medicine

## 2015-05-18 VITALS — BP 142/80 | HR 62 | Temp 98.3°F | Resp 14 | Ht 61.5 in | Wt 181.0 lb

## 2015-05-18 DIAGNOSIS — R220 Localized swelling, mass and lump, head: Secondary | ICD-10-CM | POA: Diagnosis not present

## 2015-05-18 MED ORDER — HYDROCORTISONE 2.5 % EX CREA: TOPICAL_CREAM | Freq: Three times a day (TID) | CUTANEOUS | Status: DC

## 2015-05-18 MED ORDER — TRIAMCINOLONE ACETONIDE 40 MG/ML IJ SUSP
40.0000 mg | Freq: Once | INTRAMUSCULAR | Status: AC
  Administered 2015-05-18: 40 mg via INTRAMUSCULAR

## 2015-05-18 NOTE — Progress Notes (Signed)
Subjective:  By signing my name below, I, [name] [name], attest that this documentation has been prepared under the direction and in the presence of Tonye Pearson, MD Electronically Signed: Charline Bills, ED Scribe 05/18/2015 at 6:11 PM.   Patient ID: [name] [name], female    DOB: 1935-08-28, 79 y.o.   MRN: [id number]  Chief Complaint  Patient presents with  . Allergic Reaction    Mouth burning after eating chicken from [employer]  . Pruritis    Lips itching  . discoloration    Skin discoloration around lips   HPI HPI Comments: [name] [name] is a 79 y.o. female who presents to the [facility] complaining of gradual onset of constant itching to her mouth since yesterday. Pt states that she ate a piece of [employer] chicken last night and noticed itching shortly afterwards. She reports associated swelling to her lips since yesterday as well. Pt has applied Peroxide and Vaseline to her mouth without significant relief. She denies SOB and any other complications at this time. No tongue swelling. Called her PCP Eloise Harman who suggested she come here at once w/ concern for angioedema to receive steroid injection.  Patient Active Problem List   Diagnosis Date Noted  . CAD (coronary artery disease)   . HLD (hyperlipidemia)   . Obesity   . Dyspnea 12/23/2013  . Sleep apnea   . PVC (premature ventricular contraction)   . Rheumatoid arthritis(714.0) 12/29/2011  . Shoulder pain 08/29/2011  . Cough variant asthma 12/10/2009  . Upper airway cough syndrome 02/13/2009  . MURMUR 08/21/2008  . CAD, NATIVE VESSEL 05/19/2008  . KNEE PAIN 05/26/2007  . HEPATITIS C 02/06/2007  . Hyperlipidemia 02/06/2007  . DEPRESSION 02/06/2007  . Essential hypertension 02/06/2007  . IRRITABLE BOWEL SYNDROME 02/06/2007  . OSTEOPOROSIS 02/06/2007  . PANCREATITIS, HX OF 02/06/2007  . ANXIETY 12/22/2006  . GERD 12/22/2006  . HEPATITIS B, HX OF 12/22/2006    Current Outpatient  Prescriptions on File Prior to Visit  Medication Sig Dispense Refill  . albuterol (PROVENTIL) (2.5 MG/3ML) 0.083% nebulizer solution Take 2.5 mg by nebulization every 6 (six) hours as needed for wheezing or shortness of breath.    . clopidogrel (PLAVIX) 75 MG tablet Take 75 mg by mouth daily.     . CRESTOR 20 MG tablet Take 1 tablet (20 mg total) by mouth daily. 90 tablet 3  . famotidine (PEPCID) 20 MG tablet Take 20 mg by mouth at bedtime.    . gabapentin (NEURONTIN) 100 MG capsule Take 1 capsule (100 mg total) by mouth 3 (three) times daily. One three times daily 90 capsule 2  . mometasone-formoterol (DULERA) 100-5 MCG/ACT AERO Take 2 puffs first thing in am and then another 2 puffs about 12 hours later.    . nitroGLYCERIN (NITROSTAT) 0.4 MG SL tablet Place 0.4 mg under the tongue every 5 (five) minutes as needed for chest pain.     . pantoprazole (PROTONIX) 40 MG tablet 1 every am 30 min before first meal    . traMADol (ULTRAM) 50 MG tablet Take 50 mg by mouth 4 (four) times daily as needed for moderate pain.     . valsartan (DIOVAN) 160 MG tablet Take 1 tablet (160 mg total) by mouth daily. 90 tablet 0  . Vitamin D, Ergocalciferol, (DRISDOL) 50000 UNITS CAPS capsule Take 50,000 Units by mouth every 7 (seven) days.    Marland Kitchen HYDROcodone-acetaminophen (NORCO) 5-325 MG tablet Take 1 tablet by mouth every 8 (  eight) hours as needed for severe pain. (Patient not taking: Reported on 05/18/2015) 15 tablet 0  . isosorbide mononitrate (IMDUR) 30 MG 24 hr tablet Take 1 tablet (30 mg total) by mouth daily. (Patient not taking: Reported on 05/18/2015) 30 tablet 11   No current facility-administered medications on file prior to visit.   Allergies  Allergen Reactions  . Clarithromycin Other (See Comments)    unknown  . Doxycycline Nausea Only  . Pravastatin Itching    REACTION: itching  . Levaquin [Levofloxacin In D5w] Rash    Rash, HA  . Penicillins Itching and Rash   Review of Systems  HENT: Positive  for facial swelling (mouth).   Respiratory: Negative for shortness of breath.       Objective:   Physical Exam  Constitutional: She is oriented to person, place, and time. She appears well-developed and well-nourished. No distress.  HENT:  Head: Normocephalic and atraumatic.  Upper and lower lips are swollen without any intraoral involvement including tongue. No vesicles. Pale perioral halo.  Eyes: Conjunctivae and EOM are normal.  Neck: Neck supple.  Cardiovascular: Normal rate.   Pulmonary/Chest: Effort normal.  Musculoskeletal: Normal range of motion.  Neurological: She is alert and oriented to person, place, and time.  Skin: Skin is warm and dry.  Psychiatric: She has a normal mood and affect. Her behavior is normal.  Nursing note and vitals reviewed.     Assessment & Plan:   I have completed the patient encounter in its entirety as documented by the scribe, with editing by me where necessary. Kannon Baum P. Merla Riches, M.D.   Swelling of both lips - Plan: triamcinolone acetonide (KENALOG-40) injection 40 mg  Meds ordered this encounter  Medications  . triamcinolone acetonide (KENALOG-40) injection 40 mg    Sig:   . hydrocortisone 2.5 % cream    Sig: Apply topically 3 (three) times daily. For 3-5 days    Dispense:  3.5 g    Refill:  0

## 2015-05-19 ENCOUNTER — Telehealth: Payer: Self-pay | Admitting: Internal Medicine

## 2015-05-19 NOTE — Telephone Encounter (Signed)
We saw her for an allergic reaction, gave her an injection of Kenalog, sent a prescription for triamcinolone cream to the pharmacy did not give her any hydrocodone/pain meds

## 2015-05-19 NOTE — Telephone Encounter (Signed)
Patient states that she is getting worse and wants Dr. Merla Riches to know that the pharmacy did not have the Hydrocodone so she was unable to get it. I advised patient that Dr. Merla Riches comes in today at 2pm.  [phone]

## 2015-05-20 NOTE — Telephone Encounter (Signed)
Spoke with pt, her mouth is still itching and swelling. Advised pt to rtc if worse.

## 2015-05-25 ENCOUNTER — Other Ambulatory Visit (HOSPITAL_COMMUNITY): Payer: Self-pay | Admitting: Nurse Practitioner

## 2015-05-25 DIAGNOSIS — B1920 Unspecified viral hepatitis C without hepatic coma: Secondary | ICD-10-CM

## 2015-05-26 ENCOUNTER — Other Ambulatory Visit: Payer: Self-pay | Admitting: Internal Medicine

## 2015-06-15 ENCOUNTER — Other Ambulatory Visit: Payer: Self-pay | Admitting: Cardiology

## 2015-06-16 ENCOUNTER — Other Ambulatory Visit (INDEPENDENT_AMBULATORY_CARE_PROVIDER_SITE_OTHER): Payer: Medicare Other | Admitting: *Deleted

## 2015-06-16 DIAGNOSIS — E785 Hyperlipidemia, unspecified: Secondary | ICD-10-CM

## 2015-06-16 LAB — LIPID PANEL
Cholesterol: 123 mg/dL — ABNORMAL LOW (ref 125–200)
HDL: 61 mg/dL (ref >=46)
LDL Cholesterol: 50 mg/dL (ref <130)
Total CHOL/HDL Ratio: 2 Ratio (ref <=5.0)
Triglycerides: 59 mg/dL (ref <150)
VLDL: 12 mg/dL (ref <30)

## 2015-06-19 ENCOUNTER — Other Ambulatory Visit: Payer: Self-pay | Admitting: *Deleted

## 2015-06-19 MED ORDER — VALSARTAN 160 MG PO TABS: ORAL_TABLET | ORAL | Status: DC

## 2015-06-27 ENCOUNTER — Other Ambulatory Visit: Payer: Self-pay | Admitting: Internal Medicine

## 2015-06-29 ENCOUNTER — Telehealth: Payer: Self-pay | Admitting: Cardiology

## 2015-06-29 NOTE — Telephone Encounter (Signed)
Pt calling requesting to speak with Bonita Quin about getting a tier reduction for medication Crestor 20 mg. Please advise

## 2015-06-30 ENCOUNTER — Ambulatory Visit (HOSPITAL_COMMUNITY)
Admission: RE | Admit: 2015-06-30 | Discharge: 2015-06-30 | Disposition: A | Discharge status: Home / Self Care | Payer: Medicare Other | Source: Ambulatory Visit | Attending: Nurse Practitioner | Admitting: Nurse Practitioner

## 2015-06-30 DIAGNOSIS — I714 Abdominal aortic aneurysm, without rupture: Secondary | ICD-10-CM | POA: Diagnosis not present

## 2015-06-30 DIAGNOSIS — Z9049 Acquired absence of other specified parts of digestive tract: Secondary | ICD-10-CM | POA: Diagnosis not present

## 2015-06-30 DIAGNOSIS — B1920 Unspecified viral hepatitis C without hepatic coma: Secondary | ICD-10-CM

## 2015-07-07 ENCOUNTER — Telehealth: Payer: Self-pay

## 2015-07-07 NOTE — Telephone Encounter (Signed)
Spoke with patient. Sent to [facility] for PA first.

## 2015-07-07 NOTE — Telephone Encounter (Signed)
Prior auth for Crestor (brand)20mg  sent to [facility].

## 2015-07-08 ENCOUNTER — Telehealth: Payer: Self-pay

## 2015-07-08 NOTE — Telephone Encounter (Signed)
Prior auth for Crestor is still in place till 02/23/2016. With a manufacturer's coupon, her co-pay was $3.00.

## 2015-07-23 ENCOUNTER — Ambulatory Visit (INDEPENDENT_AMBULATORY_CARE_PROVIDER_SITE_OTHER): Payer: Medicare Other | Admitting: Physician Assistant

## 2015-07-23 VITALS — BP 170/100 | HR 86 | Temp 99.4°F | Resp 17 | Ht 61.0 in | Wt 179.0 lb

## 2015-07-23 DIAGNOSIS — K130 Diseases of lips: Secondary | ICD-10-CM

## 2015-07-23 MED ORDER — RANITIDINE HCL 75 MG PO TABS: 75.0000 mg | ORAL_TABLET | Freq: Three times a day (TID) | ORAL | Status: DC

## 2015-07-23 NOTE — Progress Notes (Signed)
07/23/2015 2:52 PM   DOB: 08-23-1935 / MRN: [id number]  SUBJECTIVE:  [name] [name] is a 80 y.o. female presenting for a recheck of upper lip swelling and itching.  States that this has continued despite treatment for allergy mediated swelling.  This has not helped.  She has a history of GERD and take famotidine at night and a PPI in the morning.    She is allergic to clarithromycin; doxycycline; pravastatin; levaquin; and penicillins.   She  has a past medical history of CAD (coronary artery disease); Essential hypertension; PUD (peptic ulcer disease); HBV (hepatitis B virus) infection; HCV (hepatitis C virus); Chronic cough; History of PVC's; History of PAC's; GERD (gastroesophageal reflux disease); IBS (irritable bowel syndrome); Depression; HLD (hyperlipidemia); Obesity; Pancreatitis; Osteoporosis; Osteoarthritis; Dyspnea; Arthritis; and Sleep apnea.    She  reports that she has never smoked. She has never used smokeless tobacco. She reports that she does not drink alcohol or use illicit drugs. She  reports that she does not currently engage in sexual activity. The patient  has past surgical history that includes Cholecystectomy; hyesterectomy, unspec. area (1998); pancreatic duct stent; Tubal ligation (1995 ); Cardiac catheterization (2009); and calcification of right breast-lumps (Right, 1998).  Her family history includes Arthritis in her mother; Asthma in her mother and sister; COPD in her sister; Colon cancer in her maternal aunt; Emphysema in her sister; Lung cancer in her maternal aunt; Pancreatic cancer in her brother; Stroke in her mother and sister.  Review of Systems  Constitutional: Negative for fever and chills.  Eyes: Negative for blurred vision.  Respiratory: Negative for cough and shortness of breath.   Cardiovascular: Negative for chest pain.  Gastrointestinal: Negative for nausea and abdominal pain.  Genitourinary: Negative for dysuria, urgency and frequency.    Musculoskeletal: Negative for myalgias.  Skin: Negative for rash.  Neurological: Negative for dizziness, tingling and headaches.  Psychiatric/Behavioral: Negative for depression. The patient is not nervous/anxious.     Problem list and medications reviewed and updated by myself where necessary, and exist elsewhere in the encounter.   OBJECTIVE:  BP 170/100 mmHg  Pulse 86  Temp(Src) 99.4 F (37.4 C) (Oral)  Resp 17  Ht 5\' 1"  (1.549 m)  Wt 179 lb (81.194 kg)  BMI 33.84 kg/m2  SpO2 96%  Physical Exam  Constitutional: She is oriented to person, place, and time. She appears well-developed.  HENT:  Her lips and oral mucosa are normal.   Eyes: EOM are normal. Pupils are equal, round, and reactive to light.  Cardiovascular: Normal rate.   Pulmonary/Chest: Effort normal.  Abdominal: She exhibits no distension.  Musculoskeletal: Normal range of motion.  Neurological: She is alert and oriented to person, place, and time. No cranial nerve deficit.  Skin: Skin is warm and dry. She is not diaphoretic.  Psychiatric: She has a normal mood and affect.  Vitals reviewed.   No results found for this or any previous visit (from the past 72 hour(s)).  No results found.  ASSESSMENT AND PLAN  [name] [name] was seen today for mouth swelling.  Diagnoses and all orders for this visit:  Tender lips: Advised that I don't see anything wrong with her lips.  Will try Ranitidine 75 mg before every meal and at bedtime to see if this is GERD related, however she is taking a PPI so there may be another etiology at plan.  -     ranitidine ([facility] RANITIDINE) 75 MG tablet; Take 1 tablet (75 mg total) by mouth  4 (four) times daily -  before meals and at bedtime.    The patient was advised to call or return to clinic if she does not see an improvement in symptoms or to seek the care of the closest emergency department if she worsens with the above plan.   [name] [name], MHS, PA-C [facility] 07/23/2015 2:52 PM

## 2015-10-07 ENCOUNTER — Other Ambulatory Visit: Payer: Self-pay

## 2015-10-07 DIAGNOSIS — Z1231 Encounter for screening mammogram for malignant neoplasm of breast: Secondary | ICD-10-CM

## 2015-10-10 ENCOUNTER — Emergency Department (HOSPITAL_COMMUNITY): Payer: Medicare Other

## 2015-10-10 ENCOUNTER — Encounter (HOSPITAL_COMMUNITY): Payer: Self-pay | Admitting: Emergency Medicine

## 2015-10-10 ENCOUNTER — Emergency Department (HOSPITAL_COMMUNITY)
Admission: EM | Admit: 2015-10-10 | Discharge: 2015-10-10 | Disposition: A | Discharge status: Home / Self Care | Payer: Medicare Other | Attending: Emergency Medicine | Admitting: Emergency Medicine

## 2015-10-10 DIAGNOSIS — E669 Obesity, unspecified: Secondary | ICD-10-CM | POA: Insufficient documentation

## 2015-10-10 DIAGNOSIS — F329 Major depressive disorder, single episode, unspecified: Secondary | ICD-10-CM | POA: Insufficient documentation

## 2015-10-10 DIAGNOSIS — M1990 Unspecified osteoarthritis, unspecified site: Secondary | ICD-10-CM | POA: Insufficient documentation

## 2015-10-10 DIAGNOSIS — Z79899 Other long term (current) drug therapy: Secondary | ICD-10-CM | POA: Insufficient documentation

## 2015-10-10 DIAGNOSIS — K589 Irritable bowel syndrome without diarrhea: Secondary | ICD-10-CM | POA: Diagnosis not present

## 2015-10-10 DIAGNOSIS — Z79891 Long term (current) use of opiate analgesic: Secondary | ICD-10-CM | POA: Insufficient documentation

## 2015-10-10 DIAGNOSIS — I2510 Atherosclerotic heart disease of native coronary artery without angina pectoris: Secondary | ICD-10-CM | POA: Diagnosis not present

## 2015-10-10 DIAGNOSIS — E785 Hyperlipidemia, unspecified: Secondary | ICD-10-CM | POA: Diagnosis not present

## 2015-10-10 DIAGNOSIS — R1013 Epigastric pain: Secondary | ICD-10-CM | POA: Diagnosis present

## 2015-10-10 DIAGNOSIS — I10 Essential (primary) hypertension: Secondary | ICD-10-CM | POA: Insufficient documentation

## 2015-10-10 DIAGNOSIS — K219 Gastro-esophageal reflux disease without esophagitis: Secondary | ICD-10-CM | POA: Diagnosis not present

## 2015-10-10 LAB — URINALYSIS, ROUTINE W REFLEX MICROSCOPIC
Bilirubin Urine: NEGATIVE
Glucose, UA: NEGATIVE mg/dL
Hgb urine dipstick: NEGATIVE
Ketones, ur: NEGATIVE mg/dL
Leukocytes, UA: NEGATIVE
Nitrite: NEGATIVE
Protein, ur: NEGATIVE mg/dL
Specific Gravity, Urine: 1.017 (ref 1.005–1.030)
pH: 6 (ref 5.0–8.0)

## 2015-10-10 LAB — CBC WITH DIFFERENTIAL/PLATELET
Basophils Absolute: 0 K/uL (ref 0.0–0.1)
Basophils Relative: 0 %
Eosinophils Absolute: 0.2 K/uL (ref 0.0–0.7)
Eosinophils Relative: 3 %
HCT: 41.7 % (ref 36.0–46.0)
Hemoglobin: 14.6 g/dL (ref 12.0–15.0)
Lymphocytes Relative: 32 %
Lymphs Abs: 2.6 K/uL (ref 0.7–4.0)
MCH: 30.6 pg (ref 26.0–34.0)
MCHC: 35 g/dL (ref 30.0–36.0)
MCV: 87.4 fL (ref 78.0–100.0)
Monocytes Absolute: 0.7 K/uL (ref 0.1–1.0)
Monocytes Relative: 9 %
Neutro Abs: 4.4 K/uL (ref 1.7–7.7)
Neutrophils Relative %: 56 %
Platelets: 165 K/uL (ref 150–400)
RBC: 4.77 MIL/uL (ref 3.87–5.11)
RDW: 13.5 % (ref 11.5–15.5)
WBC: 7.9 K/uL (ref 4.0–10.5)

## 2015-10-10 LAB — COMPREHENSIVE METABOLIC PANEL WITH GFR
ALT: 15 U/L (ref 14–54)
AST: 19 U/L (ref 15–41)
Albumin: 4 g/dL (ref 3.5–5.0)
Alkaline Phosphatase: 72 U/L (ref 38–126)
Anion gap: 9 (ref 5–15)
BUN: 14 mg/dL (ref 6–20)
CO2: 25 mmol/L (ref 22–32)
Calcium: 9.3 mg/dL (ref 8.9–10.3)
Chloride: 109 mmol/L (ref 101–111)
Creatinine, Ser: 0.88 mg/dL (ref 0.44–1.00)
GFR calc Af Amer: >60 mL/min
GFR calc non Af Amer: >60 mL/min
Glucose, Bld: 90 mg/dL (ref 65–99)
Potassium: 4 mmol/L (ref 3.5–5.1)
Sodium: 143 mmol/L (ref 135–145)
Total Bilirubin: 0.6 mg/dL (ref 0.3–1.2)
Total Protein: 7.2 g/dL (ref 6.5–8.1)

## 2015-10-10 LAB — LIPASE, BLOOD: Lipase: 24 U/L (ref 11–51)

## 2015-10-10 LAB — I-STAT TROPONIN, ED: Troponin i, poc: 0 ng/mL (ref 0.00–0.08)

## 2015-10-10 MED ORDER — PANTOPRAZOLE SODIUM 20 MG PO TBEC: 20.0000 mg | DELAYED_RELEASE_TABLET | Freq: Every day | ORAL | Status: DC

## 2015-10-10 MED ORDER — PANTOPRAZOLE SODIUM 40 MG IV SOLR
40.0000 mg | Freq: Once | INTRAVENOUS | Status: AC
  Administered 2015-10-10: 40 mg via INTRAVENOUS
  Filled 2015-10-10: qty 40

## 2015-10-10 MED ORDER — GI COCKTAIL ~~LOC~~
30.0000 mL | Freq: Once | ORAL | Status: AC
  Administered 2015-10-10: 30 mL via ORAL
  Filled 2015-10-10: qty 30

## 2015-10-10 NOTE — ED Provider Notes (Signed)
Medical screening examination/treatment/procedure(s) were conducted as a shared visit with non-physician practitioner(s) and myself.  I personally evaluated the patient during the encounter.   EKG Interpretation   Date/Time:  Saturday Oct 10 2015 14:30:10 EDT Ventricular Rate:  78 PR Interval:  196 QRS Duration: 105 QT Interval:  379 QTC Calculation: 432 R Axis:   -53 Text Interpretation:  Sinus rhythm Left ventricular hypertrophy  Anterolateral infarct, old no significant change since Aug 2016 Confirmed  by Criss Alvine MD, SCOTT [phone]) on 10/10/2015 2:53:05 PM     Patient here after having several days of epigastric burning after eating a taco. States that her symptoms have been relieved when she burps. EKG did not show any acute findings. Troponin here is negative. Given GI cocktail and feels better. Will prescribe PPI patient stable for discharge  Lorre Nick, MD 10/10/15 1742

## 2015-10-10 NOTE — Discharge Instructions (Signed)
Gastroesophageal Reflux Disease, Adult Normally, food travels down the esophagus and stays in the stomach to be digested. If a person has gastroesophageal reflux disease (GERD), food and stomach acid move back up into the esophagus. When this happens, the esophagus becomes sore and swollen (inflamed). Over time, GERD can make small holes (ulcers) in the lining of the esophagus. HOME CARE Diet  Follow a diet as told by your doctor. You may need to avoid foods and drinks such as:  Coffee and tea (with or without caffeine).  Drinks that contain alcohol.  Energy drinks and sports drinks.  Carbonated drinks or sodas.  Chocolate and cocoa.  Peppermint and mint flavorings.  Garlic and onions.  Horseradish.  Spicy and acidic foods, such as peppers, chili powder, curry powder, vinegar, hot sauces, and BBQ sauce.  Citrus fruit juices and citrus fruits, such as oranges, lemons, and limes.  Tomato-based foods, such as red sauce, chili, salsa, and pizza with red sauce.  Fried and fatty foods, such as donuts, french fries, potato chips, and high-fat dressings.  High-fat meats, such as hot dogs, rib eye steak, sausage, ham, and bacon.  High-fat dairy items, such as whole milk, butter, and cream cheese.  Eat small meals often. Avoid eating large meals.  Avoid drinking large amounts of liquid with your meals.  Avoid eating meals during the 2-3 hours before bedtime.  Avoid lying down right after you eat.  Do not exercise right after you eat. General Instructions  Pay attention to any changes in your symptoms.  Take over-the-counter and prescription medicines only as told by your doctor. Do not take aspirin, ibuprofen, or other NSAIDs unless your doctor says it is okay.  Do not use any tobacco products, including cigarettes, chewing tobacco, and e-cigarettes. If you need help quitting, ask your doctor.  Wear loose clothes. Do not wear anything tight around your waist.  Raise  (elevate) the head of your bed about 6 inches (15 cm).  Try to lower your stress. If you need help doing this, ask your doctor.  If you are overweight, lose an amount of weight that is healthy for you. Ask your doctor about a safe weight loss goal.  Keep all follow-up visits as told by your doctor. This is important. GET HELP IF:  You have new symptoms.  You lose weight and you do not know why it is happening.  You have trouble swallowing, or it hurts to swallow.  You have wheezing or a cough that keeps happening.  Your symptoms do not get better with treatment.  You have a hoarse voice. GET HELP RIGHT AWAY IF:  You have pain in your arms, neck, jaw, teeth, or back.  You feel sweaty, dizzy, or light-headed.  You have chest pain or shortness of breath.  You throw up (vomit) and your throw up looks like blood or coffee grounds.  You pass out (faint).  Your poop (stool) is bloody or black.  You cannot swallow, drink, or eat.   This information is not intended to replace advice given to you by your health care provider. Make sure you discuss any questions you have with your health care provider.   Take Protonix daily. Avoid spicy foods. Follow up with your primary care provider if your symptoms do not improve. Return to the ED if you experience severe worsening of your symptoms, chest pain, shortness of breath, dizziness, loss of consciousness, vomiting, diarrhea.

## 2015-10-10 NOTE — ED Notes (Signed)
Bed: WA21 Expected date:  Expected time:  Means of arrival:  Comments: Hold for triage 1 

## 2015-10-10 NOTE — ED Provider Notes (Signed)
CSN: [id number]     Arrival date & time 10/10/15  1417 History   First MD Initiated Contact with Patient 10/10/15 1448     Chief Complaint  Patient presents with  . Abdominal Pain     (Consider location/radiation/quality/duration/timing/severity/associated sxs/prior Treatment) HPI   [name] [name] is a 80 y.o F with a pmhx of CAD, Hep C on Harvoni, HTN, HLD, who presents to the ED today c/o epigastric pain. Pt states that 3 days ago she ate a spicy taco for dinner. When she woke up the next morning she felt severe burning in her epigastric area and up into the middle of her chest that radiates through to her back. Pain is 8/10 and intermittent. Pt states that she has been trying to burp without success. She also tried making her self vomit by putting a finger down her throat which she states provided a small amount of relief for a few minuets but then the burning sensation returned. Pt has tried drinking gingerale and she took 1 NTG pill today without relief. Pt states that she called her cardiologist office today who recommended that she come to the ED for further evaluation. Pt denies dizziness, SOB, LE edema, syncope, paresthesias, HA, N/V/D.    Past Medical History  Diagnosis Date  . CAD (coronary artery disease)     a. 02/2008 Cath: EF 60%, RCA 100 w/ L->R collats, LM 20d, RI 60-70, small, LAD 60/39m, 99d, D1 50, OM1 50. Poor distal targets for CABG/no good interventional target -> medical Rx; b. Myoview in [place] in 3/11 -nl;  c. 07/2011 MV: EF 67%, mild inf ischemia->Med Rx.  . Essential hypertension   . PUD (peptic ulcer disease)   . HBV (hepatitis B virus) infection   . HCV (hepatitis C virus)   . Chronic cough     hx; ACEI stopped, possible contribution from GERD  . History of PVC's     hx  . History of PAC's     hx  . GERD (gastroesophageal reflux disease)   . IBS (irritable bowel syndrome)   . Depression   . HLD (hyperlipidemia)     unable to tolerate most statins due to  myalgias and elevated CK. thinks pravastatin casued a rash. Only tolerates crestor.  . Obesity   . Pancreatitis     hx  . Osteoporosis   . Osteoarthritis     knee; s/p TKR  . Dyspnea     a. 08/2013 Echo: EF 60-65%, mild AS;  b. PFTs (10/10): FVC 103%, ratio 74%. mild obstruction; c. CT chest (6/11) no evidence for interstitial lung dz. possible asthma/upper airway cough sydrome. aspirin & beta blockers- potential causes for bronchospasm;  . Arthritis   . Sleep apnea     diagnosed in 2011 , did not want CPAP -    Past Surgical History  Procedure Laterality Date  . Cholecystectomy    . Hyesterectomy, unspec. area  1998  . Pancreatic duct stent    . Tubal ligation  1995   . Cardiac catheterization  2009  . Calcification of right breast-lumps Right 1998   Family History  Problem Relation Age of Onset  . Asthma Mother   . Arthritis Mother     rheumatiod arthritis  . Stroke Mother   . Asthma Sister   . Stroke Sister   . COPD Sister   . Emphysema Sister   . Colon cancer Maternal Aunt   . Lung cancer Maternal Aunt   . Pancreatic  cancer Brother    Social History  Substance Use Topics  . Smoking status: Never Smoker   . Smokeless tobacco: Never Used     Comment: 2nd hand exposure x40 years   . Alcohol Use: No   OB History    No data available     Review of Systems  All other systems reviewed and are negative.     Allergies  Clarithromycin; Doxycycline; Pravastatin; Levaquin; and Penicillins  Home Medications   Prior to Admission medications   Medication Sig Start Date End Date Taking? Authorizing Provider  bismuth subsalicylate (PEPTO BISMOL) 262 MG/15ML suspension Take 30 mLs by mouth every 6 (six) hours as needed for indigestion.   Yes Historical Provider, MD  clopidogrel (PLAVIX) 75 MG tablet TAKE 1 TABLET (75 MG TOTAL) BY MOUTH DAILY. 06/15/15  Yes Laurey Morale, MD  HARVONI 90-400 MG TABS Take 1 tablet by mouth daily.  09/22/15  Yes Historical Provider, MD   nitroGLYCERIN (NITROSTAT) 0.4 MG SL tablet Place 0.4 mg under the tongue every 5 (five) minutes as needed for chest pain.    Yes Historical Provider, MD  valsartan (DIOVAN) 160 MG tablet One tablet by mouth daily Patient taking differently: Take 160 mg by mouth daily. One tablet by mouth daily 06/19/15  Yes Laurey Morale, MD  CRESTOR 20 MG tablet Take 1 tablet (20 mg total) by mouth daily. Patient not taking: Reported on 10/10/2015 02/24/15   Laurey Morale, MD  gabapentin (NEURONTIN) 100 MG capsule Take 1 capsule (100 mg total) by mouth 3 (three) times daily. One three times daily Patient not taking: Reported on 10/10/2015 02/23/15   Nyoka Cowden, MD  HYDROcodone-acetaminophen (NORCO) 5-325 MG tablet Take 1 tablet by mouth every 8 (eight) hours as needed for severe pain. Patient not taking: Reported on 10/10/2015 04/25/15   Porfirio Oar, PA-C  hydrocortisone 2.5 % cream Apply topically 3 (three) times daily. For 3-5 days Patient not taking: Reported on 10/10/2015 05/18/15   Tonye Pearson, MD  isosorbide mononitrate (IMDUR) 30 MG 24 hr tablet Take 1 tablet (30 mg total) by mouth daily. Patient not taking: Reported on 10/10/2015 03/20/15   Laurey Morale, MD  mometasone-formoterol [facility]) 100-5 MCG/ACT AERO Take 2 puffs first thing in am and then another 2 puffs about 12 hours later. Patient not taking: Reported on 10/10/2015 03/09/15   Nyoka Cowden, MD  ranitidine ([facility] RANITIDINE) 75 MG tablet Take 1 tablet (75 mg total) by mouth 4 (four) times daily -  before meals and at bedtime. Patient not taking: Reported on 10/10/2015 07/23/15   Ofilia Neas, PA-C   BP 156/111 mmHg  Pulse 84  Temp(Src) 98.3 F (36.8 C) (Oral)  Resp 18  SpO2 99% Physical Exam  Constitutional: She is oriented to person, place, and time. She appears well-developed and well-nourished. No distress.  HENT:  Head: Normocephalic and atraumatic.  Mouth/Throat: No oropharyngeal exudate.  Eyes: Conjunctivae and EOM are  normal. Pupils are equal, round, and reactive to light. Right eye exhibits no discharge. Left eye exhibits no discharge. No scleral icterus.  Neck: Neck supple.  Cardiovascular: Normal rate, regular rhythm, normal heart sounds and intact distal pulses.  Exam reveals no gallop and no friction rub.   No murmur heard. Pulmonary/Chest: Effort normal and breath sounds normal. No respiratory distress. She has no wheezes. She has no rales. She exhibits no tenderness.  Abdominal: Soft. Bowel sounds are normal. She exhibits no distension and no mass.  There is no tenderness. There is no rebound and no guarding.  Musculoskeletal: Normal range of motion. She exhibits no edema.  Lymphadenopathy:    She has no cervical adenopathy.  Neurological: She is alert and oriented to person, place, and time.  Strength 5/5 throughout. No sensory deficits.    Skin: Skin is warm and dry. No rash noted. She is not diaphoretic. No erythema. No pallor.  Psychiatric: She has a normal mood and affect. Her behavior is normal.  Nursing note and vitals reviewed.   ED Course  Procedures (including critical care time) Labs Review Labs Reviewed  CBC WITH DIFFERENTIAL/PLATELET  COMPREHENSIVE METABOLIC PANEL  LIPASE, BLOOD  CBC  URINALYSIS, ROUTINE W REFLEX MICROSCOPIC (NOT AT [facility])  Rosezena Sensor, ED    Imaging Review Dg Chest 2 View  10/10/2015  CLINICAL DATA:  Epigastric pain for 3 days. EXAM: CHEST  2 VIEW COMPARISON:  February 04, 2015 FINDINGS: The heart size and mediastinal contours are within normal limits. Both lungs are clear. The visualized skeletal structures are unremarkable. IMPRESSION: No active cardiopulmonary disease. Electronically Signed   By: Gerome Sam III M.D   On: 10/10/2015 15:39   I have personally reviewed and evaluated these images and lab results as part of my medical decision-making.   EKG Interpretation   Date/Time:  Saturday Oct 10 2015 14:30:10 EDT Ventricular Rate:  78 PR  Interval:  196 QRS Duration: 105 QT Interval:  379 QTC Calculation: 432 R Axis:   -53 Text Interpretation:  Sinus rhythm Left ventricular hypertrophy  Anterolateral infarct, old no significant change since Aug 2016 Confirmed  by Criss Alvine MD, SCOTT [phone]) on 10/10/2015 2:53:05 PM      MDM   Final diagnoses:  Gastroesophageal reflux disease, esophagitis presence not specified    80 y.o F with significant CAD presents to the ED c/o epigastric burning x2 days after eating a taco. Pt appears well in ED, in NAD. Likely GERD related but will r/o cardiac etiology given hx. EKG unremarkable, no signs of ischemia. Initial troponin negative. All other lab work wnl. Pt was given GI cocktail and IV protonix with complete symptomatic improvement. Chest pain is not likely of cardiac or pulmonary etiology d/t presentation, perc negative, VSS, no tracheal deviation, no JVD or new murmur, RRR, breath sounds equal bilaterally, EKG without acute abnormalities, negative troponin, and negative CXR. Pt has been advised start a PPI and return to the ED is CP becomes exertional, associated with diaphoresis or nausea, radiates to left jaw/arm, worsens or becomes concerning in any way. Prescription for protonix given. Pt appears reliable for follow up and is agreeable to discharge.   Case has been discussed with and seen by Dr. Freida Busman who agrees with the above plan to discharge.      Dub Mikes, PA-C 10/11/15 1350

## 2015-10-10 NOTE — ED Notes (Signed)
Per pt, states she ate a taco on Wednesday and started having epigastric pain-states she has taken heartburn meds and then took a nitro today which has helped some

## 2015-10-13 ENCOUNTER — Telehealth (HOSPITAL_BASED_OUTPATIENT_CLINIC_OR_DEPARTMENT_OTHER): Payer: Self-pay | Admitting: Emergency Medicine

## 2015-10-13 ENCOUNTER — Ambulatory Visit
Admission: RE | Admit: 2015-10-13 | Discharge: 2015-10-13 | Disposition: A | Discharge status: Home / Self Care | Payer: Medicare Other | Source: Ambulatory Visit

## 2015-10-13 DIAGNOSIS — Z1231 Encounter for screening mammogram for malignant neoplasm of breast: Secondary | ICD-10-CM

## 2015-10-21 ENCOUNTER — Other Ambulatory Visit: Payer: Self-pay | Admitting: Cardiology

## 2015-10-29 ENCOUNTER — Other Ambulatory Visit: Payer: Self-pay | Admitting: Nurse Practitioner

## 2015-10-29 DIAGNOSIS — K7469 Other cirrhosis of liver: Secondary | ICD-10-CM

## 2015-10-29 DIAGNOSIS — E0789 Other specified disorders of thyroid: Secondary | ICD-10-CM

## 2015-11-05 ENCOUNTER — Ambulatory Visit
Admission: RE | Admit: 2015-11-05 | Discharge: 2015-11-05 | Disposition: A | Discharge status: Home / Self Care | Payer: Medicare Other | Source: Ambulatory Visit | Attending: Nurse Practitioner | Admitting: Nurse Practitioner

## 2015-11-05 DIAGNOSIS — K7469 Other cirrhosis of liver: Secondary | ICD-10-CM

## 2016-01-19 ENCOUNTER — Encounter: Payer: Self-pay | Admitting: Nurse Practitioner

## 2016-01-21 ENCOUNTER — Ambulatory Visit (INDEPENDENT_AMBULATORY_CARE_PROVIDER_SITE_OTHER): Payer: Medicare Other | Admitting: Nurse Practitioner

## 2016-01-21 ENCOUNTER — Encounter: Payer: Self-pay | Admitting: Nurse Practitioner

## 2016-01-21 VITALS — BP 160/98 | HR 74 | Ht 61.0 in | Wt 184.8 lb

## 2016-01-21 DIAGNOSIS — I119 Hypertensive heart disease without heart failure: Secondary | ICD-10-CM | POA: Insufficient documentation

## 2016-01-21 DIAGNOSIS — I2510 Atherosclerotic heart disease of native coronary artery without angina pectoris: Secondary | ICD-10-CM

## 2016-01-21 DIAGNOSIS — E785 Hyperlipidemia, unspecified: Secondary | ICD-10-CM

## 2016-01-21 MED ORDER — VALSARTAN 320 MG PO TABS: 320.0000 mg | ORAL_TABLET | Freq: Every day | ORAL | 8 refills | Status: DC

## 2016-01-21 NOTE — Progress Notes (Signed)
Office Visit    Patient Name: [name] [name] Date of Encounter: 01/21/2016  Primary Care Provider:  Jarome Matin (Inactive) Primary Cardiologist:  Golden Circle, MD   Chief Complaint    80 year old female with a prior history of coronary artery disease who presents for follow-up.  Past Medical History    Past Medical History:  Diagnosis Date  . Arthritis   . CAD (coronary artery disease)    a. 02/2008 Cath: EF 60%, RCA 100 w/ L->R collats, LM 20d, RI 60-70, small, LAD 60/35m, 99d, D1 50, OM1 50. Poor distal targets for CABG/no good interventional target -> medical Rx; b. Myoview in [place] in 3/11 -nl;  c. 07/2011 MV: EF 67%, mild inf ischemia->Med Rx; 02/2015 MV: EF 50%, small, mod intensity apical and apical inf rev defect-->Med Rx.  . Chronic cough    hx; ACEI stopped, possible contribution from GERD  . Depression   . Dyspnea    a. 08/2013 Echo: EF 60-65%, mild AS;  b. PFTs (10/10): FVC 103%, ratio 74%. mild obstruction; c. CT chest (6/11) no evidence for interstitial lung dz. possible asthma/upper airway cough sydrome. aspirin & beta blockers- potential causes for bronchospasm;  . Essential hypertension   . GERD (gastroesophageal reflux disease)   . HBV (hepatitis B virus) infection   . HCV (hepatitis C virus)   . History of PAC's    hx  . History of PVC's    hx  . HLD (hyperlipidemia)    unable to tolerate most statins due to myalgias and elevated CK. thinks pravastatin casued a rash. Only tolerates crestor.  . IBS (irritable bowel syndrome)   . Obesity   . Osteoarthritis    knee; s/p TKR  . Osteoporosis   . Pancreatitis    hx  . PUD (peptic ulcer disease)   . Sleep apnea    diagnosed in 2011 , did not want CPAP -    Past Surgical History:  Procedure Laterality Date  . calcification of right breast-lumps Right 1998  . CARDIAC CATHETERIZATION  2009  . CHOLECYSTECTOMY    . hyesterectomy, unspec. area  1998  . pancreatic duct stent    . TUBAL LIGATION  1995     Allergies  Allergies  Allergen Reactions  . Aspirin     Other reaction(s): Unknown  . Clarithromycin Other (See Comments)    unknown  . Doxycycline Nausea Only  . Pravastatin Itching    REACTION: itching  . Levaquin [Levofloxacin In D5w] Rash    Rash, HA  . Penicillins Itching and Rash    Other reaction(s): Unknown Has patient had a PCN reaction causing immediate rash, facial/tongue/throat swelling, SOB or lightheadedness with hypotension: No Has patient had a PCN reaction causing severe rash involving mucus membranes or skin necrosis: No Has patient had a PCN reaction that required hospitalization No Has patient had a PCN reaction occurring within the last 10 years: No If all of the above answers are "NO", then may proceed with Cephalosporin use.     History of Present Illness    80 year old female with a history of coronary artery disease dating back to 2009, at which time catheterization revealed an occluded RCA and moderate left-sided disease. There were no targets for intervention and she has been medically managed since. Last September, she was seen secondary to recurrent chest pain and underwent stress testing, which was intermediate risk, with a small reversible defect involving the apical and apical inferior walls. Findings were reviewed with  Dr. Shirlee Latch and were ultimately felt to be similar to prior Myoview's and she was medically managed. Since then, she has done well from a cardiac standpoint. She says she is reasonably active and really never stops moving. She has not been having any chest pain or dyspnea. She recently underwent treatment for hepatitis C and says that following that treatment, she did have a few days where she felt washed out, but this has since resolved. She denies chest pain, dyspnea, PND, orthopnea, dizziness, syncope, edema, or early satiety. Her blood pressure is elevated today, and she says that [facility], it is often in the 160s or  higher than as well.  Home Medications    Prior to Admission medications   Medication Sig Start Date End Date Taking? Authorizing Provider  clopidogrel (PLAVIX) 75 MG tablet TAKE 1 TABLET (75 MG TOTAL) BY MOUTH DAILY. 06/15/15  Yes Laurey Morale, MD  ergocalciferol (VITAMIN D2) 50000 units capsule Take 50,000 Units by mouth once a week.   Yes Historical Provider, MD  nitroGLYCERIN (NITROSTAT) 0.4 MG SL tablet Place 0.4 mg under the tongue every 5 (five) minutes as needed for chest pain.    Yes Historical Provider, MD  pantoprazole (PROTONIX) 20 MG tablet Take 1 tablet (20 mg total) by mouth daily. 10/10/15  Yes Samantha Tripp Dowless, PA-C  ranitidine (ZANTAC) 75 MG tablet Take 75 mg by mouth 2 (two) times daily.   Yes Historical Provider, MD  rosuvastatin (CRESTOR) 20 MG tablet Take 20 mg by mouth daily.   Yes Historical Provider, MD  valsartan (DIOVAN) 160 MG tablet Take 1 tablet (160 mg total) by mouth daily. 10/21/15  Yes Laurey Morale, MD    Review of Systems    As above, she has been doing well.  She denies chest pain, palpitations, dyspnea, pnd, orthopnea, n, v, dizziness, syncope, edema, weight gain, or early satiety.   All other systems reviewed and are otherwise negative except as noted above.  Physical Exam    VS:  BP (!) 160/98   Pulse 74   Ht 5\' 1"  (1.549 m)   Wt 184 lb 12.8 oz (83.8 kg)   BMI 34.92 kg/m  , BMI Body mass index is 34.92 kg/m. GEN: Well nourished, well developed, in no acute distress.  HEENT: normal.  Neck: Supple, no JVD, carotid bruits, or masses. Cardiac: RRR, no murmurs, rubs, or gallops. No clubbing, cyanosis, edema.  Radials/DP/PT 2+ and equal bilaterally.  Respiratory:  Respirations regular and unlabored, clear to auscultation bilaterally. GI: Soft, nontender, nondistended, BS + x 4. MS: no deformity or atrophy. Skin: warm and dry, no rash. Neuro:  Strength and sensation are intact. Psych: Normal affect.  Accessory Clinical Findings    F/u  bmet in 1 wk  Assessment & Plan    1.  Coronary artery disease without angina: Patient has been doing well over the past few months without any chest pain or dyspnea. She had an intermediate risk Myoview with a small area of apical and apical inferior ischemia in September 2016 and has been medically managed with Plavix and Crestor only. Continue medical therapy.    2. Hypertensive heart disease: Blood pressure is elevated today and she says that it is typically elevated at home as well. I will increase her Diovan to 320 mg daily and have her follow up with a basic metabolic panel in 1 week.  3. Hyperlipidemia: LDL was 50 in January. She remains on Crestor therapy.  4. Disposition: Follow-up in 6 months.   [name], NP 01/21/2016, 11:04 AM

## 2016-01-21 NOTE — Patient Instructions (Addendum)
Medication Instructions:  Your physician has recommended you make the following change in your medication:  1. INCREASE Diovan to 320mg  take one tablet by mouth daily  Labwork: Your physician recommends that you return for lab work in: 1 WEEK (BMP), Lab hours 7:30-4:30  Testing/Procedures: No new orders.   Follow-Up: Your physician wants you to follow-up in: 6 MONTHS with Dr .  You will receive a reminder letter in the mail two months in advance. If you don't receive a letter, please call our office to schedule the follow-up appointment.   Any Other Special Instructions Will Be Listed Below (If Applicable).     If you need a refill on your cardiac medications before your next appointment, please call your pharmacy.

## 2016-01-28 ENCOUNTER — Other Ambulatory Visit: Payer: Medicare Other | Admitting: *Deleted

## 2016-01-28 DIAGNOSIS — I119 Hypertensive heart disease without heart failure: Secondary | ICD-10-CM

## 2016-01-28 DIAGNOSIS — I2510 Atherosclerotic heart disease of native coronary artery without angina pectoris: Secondary | ICD-10-CM

## 2016-01-28 LAB — BASIC METABOLIC PANEL
BUN: 16 mg/dL (ref 7–25)
CO2: 23 mmol/L (ref 20–31)
Calcium: 8.6 mg/dL (ref 8.6–10.4)
Chloride: 108 mmol/L (ref 98–110)
Creat: 0.86 mg/dL (ref 0.60–0.93)
Glucose, Bld: 90 mg/dL (ref 65–99)
Potassium: 3.7 mmol/L (ref 3.5–5.3)
Sodium: 142 mmol/L (ref 135–146)

## 2016-01-29 ENCOUNTER — Telehealth: Payer: Self-pay | Admitting: Nurse Practitioner

## 2016-01-29 NOTE — Telephone Encounter (Signed)
Follow Up:     Returning call,concerning her lab results please.

## 2016-01-29 NOTE — Telephone Encounter (Signed)
Notified of lab results. 

## 2016-03-11 ENCOUNTER — Other Ambulatory Visit: Payer: Self-pay | Admitting: Cardiology

## 2016-04-15 IMAGING — NM NM MISC PROCEDURE
6 series · 36 of 36 positions shown · non-contrast
Comparison: none

[Series 1: rest_(id)_sa · 6.4mm · 6.40mm/px · 6 of 64 frames shown (1 of 2)]
[frame 6/64]
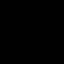
[frame 16/64]
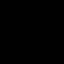
[frame 27/64]
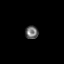
[frame 38/64]
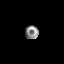
[frame 48/64]
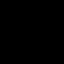
[frame 59/64]
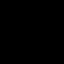

[Series 1: stress-sum-em_(id)_sa · 6.4mm · 6.40mm/px · 6 of 64 frames shown (1 of 2)]
[frame 6/64]
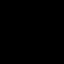
[frame 16/64]
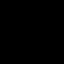
[frame 27/64]
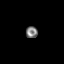
[frame 38/64]
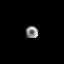
[frame 48/64]
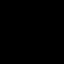
[frame 59/64]
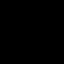

[Series 1: stress-sum-em_(id)_sa · 6.4mm · 6.40mm/px · 6 of 64 frames shown (2 of 2)]
[frame 6/64]
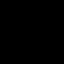
[frame 16/64]
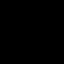
[frame 27/64]
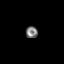
[frame 38/64]
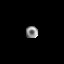
[frame 48/64]
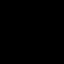
[frame 59/64]
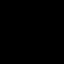

[Series 1: stress-gsp_(id)_sa · 6.4mm · 6.40mm/px · 6 of 512 frames shown (1 of 2)]
[frame 43/512]
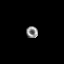
[frame 128/512]
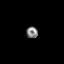
[frame 214/512]
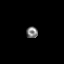
[frame 299/512]
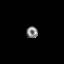
[frame 384/512]
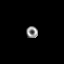
[frame 470/512]
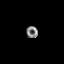

[Series 1: stress-gsp_(id)_sa · 6.4mm · 6.40mm/px · 6 of 512 frames shown (2 of 2)]
[frame 43/512]
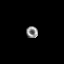
[frame 128/512]
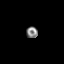
[frame 214/512]
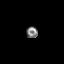
[frame 299/512]
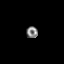
[frame 384/512]
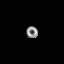
[frame 470/512]
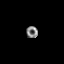

[Series 1: rest_(id)_sa · 6.4mm · 6.40mm/px · 6 of 64 frames shown (2 of 2)]
[frame 6/64]
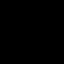
[frame 16/64]
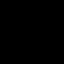
[frame 27/64]
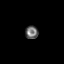
[frame 38/64]
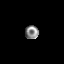
[frame 48/64]
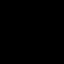
[frame 59/64]
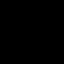

[36 of 36 positions shown; findings below may reference images not displayed]

Canned report from images found in remote index.

Refer to host system for actual result text.

## 2016-05-17 ENCOUNTER — Other Ambulatory Visit: Payer: Self-pay | Admitting: Nurse Practitioner

## 2016-05-17 DIAGNOSIS — K7469 Other cirrhosis of liver: Secondary | ICD-10-CM

## 2016-05-26 ENCOUNTER — Ambulatory Visit
Admission: RE | Admit: 2016-05-26 | Discharge: 2016-05-26 | Disposition: A | Discharge status: Home / Self Care | Payer: Medicare Other | Source: Ambulatory Visit | Attending: Nurse Practitioner | Admitting: Nurse Practitioner

## 2016-05-26 DIAGNOSIS — K7469 Other cirrhosis of liver: Secondary | ICD-10-CM

## 2016-05-28 ENCOUNTER — Telehealth: Payer: Self-pay | Admitting: Pulmonary Disease

## 2016-05-28 MED ORDER — AZITHROMYCIN 250 MG PO TABS: ORAL_TABLET | ORAL | 0 refills | Status: DC

## 2016-05-28 MED ORDER — PREDNISONE 10 MG PO TABS: ORAL_TABLET | ORAL | 0 refills | Status: DC

## 2016-05-28 NOTE — Telephone Encounter (Signed)
Coughing x weeks not on subtherapeutic gerd rx  Prednisone 10 mg take  4 each am x 2 days,   2 each am x 2 days,  1 each am x 2 days and stop  Pantoprazole (protonix) 40 mg   Take  30-60 min before first meal of the day and Pepcid (famotidine)  20 mg one @  bedtime until return to office - this is the best way to tell whether stomach acid is contributing to your problem.  zpak (denies reaction)   F/u ov in 2 weeks with all meds in hand - to UC/ER in meantime if worsen

## 2016-05-31 ENCOUNTER — Telehealth: Payer: Self-pay | Admitting: Internal Medicine

## 2016-05-31 MED ORDER — ALBUTEROL SULFATE (2.5 MG/3ML) 0.083% IN NEBU: 2.5000 mg | INHALATION_SOLUTION | Freq: Four times a day (QID) | RESPIRATORY_TRACT | 0 refills | Status: AC | PRN

## 2016-05-31 NOTE — Telephone Encounter (Signed)
Called and spoke with pt and she is aware of refill of the albuterol for her nebulizer.  She called and spoke with doc on call Saturday and was told that she needs to take the nebs to help her breathing.  Refill has been sent in but advised the pharmacy that pt will need appt for further refills.

## 2016-06-20 ENCOUNTER — Ambulatory Visit (INDEPENDENT_AMBULATORY_CARE_PROVIDER_SITE_OTHER): Payer: PPO | Admitting: Adult Health

## 2016-06-20 ENCOUNTER — Ambulatory Visit (INDEPENDENT_AMBULATORY_CARE_PROVIDER_SITE_OTHER)
Admission: RE | Admit: 2016-06-20 | Discharge: 2016-06-20 | Disposition: A | Discharge status: Home / Self Care | Payer: PPO | Source: Ambulatory Visit | Attending: Adult Health | Admitting: Adult Health

## 2016-06-20 ENCOUNTER — Ambulatory Visit (HOSPITAL_COMMUNITY): Payer: Medicare Other

## 2016-06-20 ENCOUNTER — Encounter: Payer: Self-pay | Admitting: Adult Health

## 2016-06-20 VITALS — BP 138/78 | HR 62 | Temp 97.9°F | Ht 60.0 in | Wt 184.2 lb

## 2016-06-20 DIAGNOSIS — J45991 Cough variant asthma: Secondary | ICD-10-CM | POA: Diagnosis not present

## 2016-06-20 DIAGNOSIS — J181 Lobar pneumonia, unspecified organism: Secondary | ICD-10-CM

## 2016-06-20 DIAGNOSIS — J189 Pneumonia, unspecified organism: Secondary | ICD-10-CM

## 2016-06-20 MED ORDER — BUDESONIDE-FORMOTEROL FUMARATE 80-4.5 MCG/ACT IN AERO: 2.0000 | INHALATION_SPRAY | Freq: Two times a day (BID) | RESPIRATORY_TRACT | 5 refills | Status: DC

## 2016-06-20 MED ORDER — AZITHROMYCIN 500 MG PO TABS: 500.0000 mg | ORAL_TABLET | Freq: Every day | ORAL | 0 refills | Status: DC

## 2016-06-20 MED ORDER — BUDESONIDE-FORMOTEROL FUMARATE 80-4.5 MCG/ACT IN AERO: 2.0000 | INHALATION_SPRAY | Freq: Two times a day (BID) | RESPIRATORY_TRACT | 0 refills | Status: DC

## 2016-06-20 MED ORDER — PREDNISONE 10 MG PO TABS: ORAL_TABLET | ORAL | 0 refills | Status: DC

## 2016-06-20 NOTE — Assessment & Plan Note (Signed)
Slow to resolve flare  Check cxr today   Plan  Patient Instructions  Prednisone taper over next week  Restart Symbicort 80 2 puffs Twice daily  , rinse after use  Mucinex Twice daily  As needed  Cough/congestion  Delsym 2 tsp Twice daily  As needed  Cough  Add Zyrtec 10mg  At bedtime  As needed  Drainage .  Continue on Protonix and Pepcid .  GERD At bedtime   Follow up with Dr.  In 6 weeks and As needed   Please contact office for sooner follow up if symptoms do not improve or worsen or seek emergency care  Chest xray today .

## 2016-06-20 NOTE — Progress Notes (Signed)
@Patient  ID: , female    DOB: 08/14/1935, 81 y.o.   MRN: [id number]  Chief Complaint  Patient presents with  . Acute Visit    cough     Referring provider: No ref. provider found  HPI: 81 yo AAF never smoker followed for RAD/Cough variant asthma -? GERD /AR triggers.  CAD   TEST  CXR 08/10/09 shows bibasal interstitial prominence, lt volume loss with raised hemidiaphragm  CT angio chest 01/2008 was neg for PE, stable RUL 52mm nodule as compared to 05/2005, no obvious fibrosis. CT sinuses 8/11 nml  She reports intermittent heartburn with protonix , had an EGD by Dr 10/11 in 11/10  PFTs in 03/2009 were essentially nml, no obstruction, nml lung volumes & diffusion.   Spirometry 6/11 shows nml lung function, RAST neg, Labs showed neg ANA, and low ESR. RA factor was elevated.  ER visit on 01/09/10 - absolute eos count 800 ,BNP 69, CXR - cardiomegaly, was on prednisone .  Sep, 2011 >> Referred to Rheum 01-13-2005) - favors OA rather than RA, CCP neg, CK was high 709 - muscle cramps, crestor stopped Stopped advair x 2 weeks - 'ran out', beta blockers stopped    06/20/2016 Acute OV : Cough  Pt presents for an acute office visit . Complains of 3 weeks of cough, congestion , thick white mucus , wheezing , drainage and barking cough . Was called in zpack and pred taper 12/23.  Says she got some better but never totally over it and then last week sx increased with lots of cough and wheezing . Was on Symbicort and dulera  in past but stopped it last year when she was better, Got too expensive .  Cough is keeping her up at night . Can not take prescription cough syrups cause nausea.  She denies fever , body aches or n/v/d.     Allergies  Allergen Reactions  . Aspirin     Other reaction(s): Unknown  . Clarithromycin Other (See Comments)    unknown  . Doxycycline Nausea Only  . Pravastatin Itching    REACTION: itching  . Levaquin [Levofloxacin In D5w] Rash    Rash, HA  .  Penicillins Itching and Rash    Other reaction(s): Unknown Has patient had a PCN reaction causing immediate rash, facial/tongue/throat swelling, SOB or lightheadedness with hypotension: No Has patient had a PCN reaction causing severe rash involving mucus membranes or skin necrosis: No Has patient had a PCN reaction that required hospitalization No Has patient had a PCN reaction occurring within the last 10 years: No If all of the above answers are "NO", then may proceed with Cephalosporin use.     Immunization History  Administered Date(s) Administered  . Td 06/06/1993    Past Medical History:  Diagnosis Date  . Arthritis   . CAD (coronary artery disease)    a. 02/2008 Cath: EF 60%, RCA 100 w/ L->R collats, LM 20d, RI 60-70, small, LAD 60/45m, 99d, D1 50, OM1 50. Poor distal targets for CABG/no good interventional target -> medical Rx; b. [place]oview in [place] in 3/11 -nl;  c. 07/2011 MV: EF 67%, mild inf ischemia->Med Rx; 02/2015 MV: EF 50%, small, mod intensity apical and apical inf rev defect-->Med Rx.  . Chronic cough    hx; ACEI stopped, possible contribution from GERD  . Depression   . Dyspnea    a. 08/2013 Echo: EF 60-65%, mild AS;  b. PFTs (10/10): FVC 103%, ratio 74%. mild obstruction;  c. CT chest (6/11) no evidence for interstitial lung dz. possible asthma/upper airway cough sydrome. aspirin & beta blockers- potential causes for bronchospasm;  . GERD (gastroesophageal reflux disease)   . HBV (hepatitis B virus) infection   . HCV (hepatitis C virus)   . History of PAC's    hx  . History of PVC's    hx  . HLD (hyperlipidemia)    unable to tolerate most statins due to myalgias and elevated CK. thinks pravastatin casued a rash. Only tolerates crestor.  . Hypertensive heart disease   . IBS (irritable bowel syndrome)   . Obesity   . Osteoarthritis    knee; s/p TKR  . Osteoporosis   . Pancreatitis    hx  . PUD (peptic ulcer disease)   . Sleep apnea    diagnosed in 2011 , did not  want CPAP -     Tobacco History: History  Smoking Status  . Never Smoker  Smokeless Tobacco  . Never Used    Comment: 2nd hand exposure x40 years    Counseling given: Not Answered   Outpatient Encounter Prescriptions as of 06/20/2016  Medication Sig  . albuterol (PROVENTIL) (2.5 MG/3ML) 0.083% nebulizer solution Take 3 mLs (2.5 mg total) by nebulization every 6 (six) hours as needed for wheezing or shortness of breath.  . clopidogrel (PLAVIX) 75 MG tablet TAKE 1 TABLET (75 MG TOTAL) BY MOUTH DAILY.  Marland Kitchen CRESTOR 20 MG tablet TAKE 1 TABLET (20 MG TOTAL) BY MOUTH DAILY.  . ergocalciferol (VITAMIN D2) 50000 units capsule Take 50,000 Units by mouth once a week.  . nitroGLYCERIN (NITROSTAT) 0.4 MG SL tablet Place 0.4 mg under the tongue every 5 (five) minutes as needed for chest pain.   . pantoprazole (PROTONIX) 20 MG tablet Take 1 tablet (20 mg total) by mouth daily.  . ranitidine (ZANTAC) 75 MG tablet Take 75 mg by mouth 2 (two) times daily.  . valsartan (DIOVAN) 320 MG tablet Take 1 tablet (320 mg total) by mouth daily.  . [DISCONTINUED] azithromycin (ZITHROMAX) 250 MG tablet Take 2 on day one then 1 daily x 4 days  . [DISCONTINUED] predniSONE (DELTASONE) 10 MG tablet Take  4 each am x 2 days,   2 each am x 2 days,  1 each am x 2 days and stop   No facility-administered encounter medications on file as of 06/20/2016.      Review of Systems  Constitutional:   No  weight loss, night sweats,  Fevers, chills,  +fatigue, or  lassitude.  HEENT:   No headaches,  Difficulty swallowing,  Tooth/dental problems, or  Sore throat,                No sneezing, itching, ear ache, + nasal congestion, post nasal drip,   CV:  No chest pain,  Orthopnea, PND, swelling in lower extremities, anasarca, dizziness, palpitations, syncope.   GI  No heartburn, indigestion, abdominal pain, nausea, vomiting, diarrhea, change in bowel habits, loss of appetite, bloody stools.   Resp:    No chest wall  deformity  Skin: no rash or lesions.  GU: no dysuria, change in color of urine, no urgency or frequency.  No flank pain, no hematuria   MS:  No joint pain or swelling.  No decreased range of motion.  No back pain.    Physical Exam  BP 138/78   Pulse 62   Temp 97.9 F (36.6 C) (Oral)   Ht 5' (1.524 m)   SpO2  97%   GEN: A/Ox3; pleasant , NAD, elderly , barking cough    HEENT:  [place]/AT,  EACs-clear, TMs-wnl, NOSE-clear, THROAT-clear, no lesions, no postnasal drip or exudate noted.   NECK:  Supple w/ fair ROM; no JVD; normal carotid impulses w/o bruits; no thyromegaly or nodules palpated; no lymphadenopathy.  No Stridor   RESP  FEw exp wheezing  no accessory muscle use, no dullness to percussion, speaks in full sentences   CARD:  RRR, no m/r/g, no peripheral edema, pulses intact, no cyanosis or clubbing.  GI:   Soft & nt; nml bowel sounds; no organomegaly or masses detected.   Musco: Warm bil, no deformities or joint swelling noted.   Neuro: alert, no focal deficits noted.    Skin: Warm, no lesions or rashes  Psych:  No change in mood or affect. No depression or anxiety.  No memory loss.  Lab Results:  CBC    Component Value Date/Time   WBC 7.9 10/10/2015 1556   RBC 4.77 10/10/2015 1556   HGB 14.6 10/10/2015 1556   HCT 41.7 10/10/2015 1556   PLT 165 10/10/2015 1556   MCV 87.4 10/10/2015 1556   MCH 30.6 10/10/2015 1556   MCHC 35.0 10/10/2015 1556   RDW 13.5 10/10/2015 1556   LYMPHSABS 2.6 10/10/2015 1556   MONOABS 0.7 10/10/2015 1556   EOSABS 0.2 10/10/2015 1556   BASOSABS 0.0 10/10/2015 1556    BMET    Component Value Date/Time   NA 142 01/28/2016 0841   K 3.7 01/28/2016 0841   CL 108 01/28/2016 0841   CO2 23 01/28/2016 0841   GLUCOSE 90 01/28/2016 0841   BUN 16 01/28/2016 0841   CREATININE 0.86 01/28/2016 0841   CALCIUM 8.6 01/28/2016 0841   GFRNONAA >60 10/10/2015 1556   GFRAA >60 10/10/2015 1556    BNP No results found for: BNP  ProBNP     Component Value Date/Time   PROBNP 43.0 12/23/2013 1304    Imaging: US Abdomen Limited Ruq  Result Date: 05/26/2016 CLINICAL DATA:  Cirrhosis, history of hepatitis C EXAM: US ABDOMEN LIMITED - RIGHT UPPER QUADRANT COMPARISON:  11/05/2015 FINDINGS: Gallbladder: Surgically removed Common bile duct: Diameter: 8 mm. Liver: No focal lesion identified. Within normal limits in parenchymal echogenicity. IMPRESSION: Status post cholecystectomy.  No acute abnormality is noted. Electronically Signed   By: Inez Catalina M.D.   On: 05/26/2016 12:14     Assessment & Plan:   No problem-specific Assessment & Plan notes found for this encounter.     Rexene Edison, NP 06/20/2016

## 2016-06-20 NOTE — Addendum Note (Signed)
Addended by: Abigail Miyamoto D on: 06/20/2016 10:56 AM   Modules accepted: Orders

## 2016-06-20 NOTE — Progress Notes (Signed)
Chart and office note reviewed in detail  > agree with a/p as outlined    

## 2016-06-20 NOTE — Addendum Note (Signed)
Addended by: Abigail Miyamoto D on: 06/20/2016 10:53 AM   Modules accepted: Orders

## 2016-06-20 NOTE — Patient Instructions (Addendum)
Prednisone taper over next week  Restart Symbicort 80 2 puffs Twice daily  , rinse after use  Mucinex Twice daily  As needed  Cough/congestion  Delsym 2 tsp Twice daily  As needed  Cough  Add Zyrtec 10mg  At bedtime  As needed  Drainage .  Continue on Protonix and Pepcid .  GERD At bedtime   Follow up with Dr.  In 6 weeks and As needed   Please contact office for sooner follow up if symptoms do not improve or worsen or seek emergency care  Chest xray today .

## 2016-06-20 NOTE — Addendum Note (Signed)
Addended by: Abigail Miyamoto D on: 06/20/2016 02:22 PM   Modules accepted: Orders

## 2016-06-30 ENCOUNTER — Other Ambulatory Visit: Payer: Self-pay | Admitting: Cardiology

## 2016-06-30 NOTE — Telephone Encounter (Signed)
Rx refill sent to pharmacy. 

## 2016-07-04 ENCOUNTER — Ambulatory Visit (INDEPENDENT_AMBULATORY_CARE_PROVIDER_SITE_OTHER)
Admission: RE | Admit: 2016-07-04 | Discharge: 2016-07-04 | Disposition: A | Discharge status: Home / Self Care | Payer: Medicare Other | Source: Ambulatory Visit | Attending: Adult Health | Admitting: Adult Health

## 2016-07-04 ENCOUNTER — Encounter: Payer: Self-pay | Admitting: Adult Health

## 2016-07-04 ENCOUNTER — Ambulatory Visit (INDEPENDENT_AMBULATORY_CARE_PROVIDER_SITE_OTHER): Payer: PPO | Admitting: Adult Health

## 2016-07-04 DIAGNOSIS — J181 Lobar pneumonia, unspecified organism: Secondary | ICD-10-CM | POA: Diagnosis not present

## 2016-07-04 DIAGNOSIS — J189 Pneumonia, unspecified organism: Secondary | ICD-10-CM | POA: Insufficient documentation

## 2016-07-04 DIAGNOSIS — J45991 Cough variant asthma: Secondary | ICD-10-CM

## 2016-07-04 DIAGNOSIS — R0602 Shortness of breath: Secondary | ICD-10-CM | POA: Diagnosis not present

## 2016-07-04 NOTE — Progress Notes (Signed)
$'@Patient's$  ID: [name] [name], female    DOB: Jul 29, 1935, 81 y.o.   MRN: [id number]  Chief Complaint  Patient presents with  . Follow-up    CAP     Referring provider: No ref. provider found  HPI: 81 yo AAF never smoker followed for RAD/Cough variant asthma -? GERD /AR triggers.  CAD   TEST  CXR 08/10/09 shows bibasal interstitial prominence, lt volume loss with raised hemidiaphragm  CT angio chest 01/2008 was neg for PE, stable RUL 62m nodule as compared to 05/2005, no obvious fibrosis. CT sinuses 8/11 nml  She reports intermittent heartburn with protonix , had an EGD by Dr MCollene Maresin 11/10  PFTs in 03/2009 were essentially nml, no obstruction, nml lung volumes & diffusion.   Spirometry 6/11 shows nml lung function, RAST neg, Labs showed neg ANA, and low ESR. RA factor was elevated.  ER visit on 01/09/10 - absolute eos count 800 ,BNP 69, CXR - cardiomegaly, was on prednisone .  Sep, 2011 >>Referred to Rheum ([place] - favors OA rather than RA, CCP neg, CK was high 709 - muscle cramps, crestor stopped Stopped advair x 2 weeks - 'ran out', beta blockers stopped   07/04/2016 follow-up pneumonia. Patient presents for a two-week follow-up for recently diagnosed pneumonia. Patient was seen last visit for cough and congestion. Chest x-ray showed a left lower lobe pneumonia. She was treated with Zithromax 500 mg for 7 days. Patient returns today feeling better. Cough and congestion are less. She remains tired. Appetite is improving. Chest x-ray today shows resolution of left lower lobe infiltrate. She denies fever or n/v/d   Allergies  Allergen Reactions  . Aspirin     Other reaction(s): Unknown  . Clarithromycin Other (See Comments)    unknown  . Doxycycline Nausea Only  . Pravastatin Itching    REACTION: itching  . Levaquin [Levofloxacin In D5w] Rash    Rash, HA  . Penicillins Itching and Rash    Other reaction(s): Unknown Has patient had a PCN reaction causing immediate rash,  facial/tongue/throat swelling, SOB or lightheadedness with hypotension: No Has patient had a PCN reaction causing severe rash involving mucus membranes or skin necrosis: No Has patient had a PCN reaction that required hospitalization No Has patient had a PCN reaction occurring within the last 10 years: No If all of the above answers are "NO", then may proceed with Cephalosporin use.     Immunization History  Administered Date(s) Administered  . Td 06/06/1993    Past Medical History:  Diagnosis Date  . Arthritis   . CAD (coronary artery disease)    a. 02/2008 Cath: EF 60%, RCA 100 w/ L->R collats, LM 20d, RI 60-70, small, LAD 60/640m99d, D1 50, OM1 50. Poor distal targets for CABG/no good interventional target -> medical Rx; b. [place]oview in [place] in 3/11 -nl;  c. 07/2011 MV: EF 67%, mild inf ischemia->Med Rx; 02/2015 MV: EF 50%, small, mod intensity apical and apical inf rev defect-->Med Rx.  . Chronic cough    hx; ACEI stopped, possible contribution from GERD  . Depression   . Dyspnea    a. 08/2013 Echo: EF 60-65%, mild AS;  b. PFTs (10/10): FVC 103%, ratio 74%. mild obstruction; c. CT chest (6/11) no evidence for interstitial lung dz. possible asthma/upper airway cough sydrome. aspirin & beta blockers- potential causes for bronchospasm;  . GERD (gastroesophageal reflux disease)   . HBV (hepatitis B virus) infection   . HCV (hepatitis C virus)   .  History of PAC's    hx  . History of PVC's    hx  . HLD (hyperlipidemia)    unable to tolerate most statins due to myalgias and elevated CK. thinks pravastatin casued a rash. Only tolerates crestor.  . Hypertensive heart disease   . IBS (irritable bowel syndrome)   . Obesity   . Osteoarthritis    knee; s/p TKR  . Osteoporosis   . Pancreatitis    hx  . PUD (peptic ulcer disease)   . Sleep apnea    diagnosed in 2011 , did not want CPAP -     Tobacco History: History  Smoking Status  . Never Smoker  Smokeless Tobacco  . Never Used     Comment: 2nd hand exposure x40 years    Counseling given: Not Answered   Outpatient Encounter Prescriptions as of 07/04/2016  Medication Sig  . albuterol (PROVENTIL) (2.5 MG/3ML) 0.083% nebulizer solution Take 3 mLs (2.5 mg total) by nebulization every 6 (six) hours as needed for wheezing or shortness of breath.  . budesonide-formoterol (SYMBICORT) 80-4.5 MCG/ACT inhaler Inhale 2 puffs into the lungs 2 (two) times daily.  . clopidogrel (PLAVIX) 75 MG tablet TAKE 1 TABLET (75 MG TOTAL) BY MOUTH DAILY.  Marland Kitchen CRESTOR 20 MG tablet TAKE 1 TABLET (20 MG TOTAL) BY MOUTH DAILY.  . ergocalciferol (VITAMIN D2) 50000 units capsule Take 50,000 Units by mouth once a week.  . famotidine (PEPCID) 20 MG tablet Take 20 mg by mouth at bedtime.  . nitroGLYCERIN (NITROSTAT) 0.4 MG SL tablet Place 0.4 mg under the tongue every 5 (five) minutes as needed for chest pain.   . pantoprazole (PROTONIX) 20 MG tablet Take 1 tablet (20 mg total) by mouth daily.  . valsartan (DIOVAN) 320 MG tablet Take 1 tablet (320 mg total) by mouth daily.  . ranitidine (ZANTAC) 75 MG tablet Take 75 mg by mouth 2 (two) times daily.  . [DISCONTINUED] azithromycin (ZITHROMAX) 500 MG tablet Take 1 tablet (500 mg total) by mouth daily.  . [DISCONTINUED] predniSONE (DELTASONE) 10 MG tablet 4 tabs for 2 days, then 3 tabs for 2 days, 2 tabs for 2 days, then 1 tab for 2 days, then stop   No facility-administered encounter medications on file as of 07/04/2016.      Review of Systems  Constitutional:   No  weight loss, night sweats,  Fevers, chills,  +fatigue, or  lassitude.  HEENT:   No headaches,  Difficulty swallowing,  Tooth/dental problems, or  Sore throat,                No sneezing, itching, ear ache, nasal congestion, post nasal drip,   CV:  No chest pain,  Orthopnea, PND, swelling in lower extremities, anasarca, dizziness, palpitations, syncope.   GI  No heartburn, indigestion, abdominal pain, nausea, vomiting, diarrhea, change in  bowel habits, loss of appetite, bloody stools.   Resp: No shortness of breath with exertion or at rest.  No excess mucus, no productive cough,  No non-productive cough,  No coughing up of blood.  No change in color of mucus.  No wheezing.  No chest wall deformity  Skin: no rash or lesions.  GU: no dysuria, change in color of urine, no urgency or frequency.  No flank pain, no hematuria   MS:  No joint pain or swelling.  No decreased range of motion.  No back pain.    Physical Exam  BP 138/80   Pulse 76   Temp  97.8 F (36.6 C) (Oral)   Ht '5\' 1"'$  (1.549 m)   Wt 186 lb 3.2 oz (84.5 kg)   SpO2 100%   BMI 35.18 kg/m   GEN: A/Ox3; pleasant , NAD, well nourished    HEENT:  [place]/AT,  EACs-clear, TMs-wnl, NOSE-clear, THROAT-clear, no lesions, no postnasal drip or exudate noted.   NECK:  Supple w/ fair ROM; no JVD; normal carotid impulses w/o bruits; no thyromegaly or nodules palpated; no lymphadenopathy.    RESP  Clear  P & A; w/o, wheezes/ rales/ or rhonchi. no accessory muscle use, no dullness to percussion  CARD:  RRR, no m/r/g, no peripheral edema, pulses intact, no cyanosis or clubbing.  GI:   Soft & nt; nml bowel sounds; no organomegaly or masses detected.   Musco: Warm bil, no deformities or joint swelling noted.   Neuro: alert, no focal deficits noted.    Skin: Warm, no lesions or rashes  Psych:  No change in mood or affect. No depression or anxiety.  No memory loss.  Lab Results:  CBC    Component Value Date/Time   WBC 7.9 10/10/2015 1556   RBC 4.77 10/10/2015 1556   HGB 14.6 10/10/2015 1556   HCT 41.7 10/10/2015 1556   PLT 165 10/10/2015 1556   MCV 87.4 10/10/2015 1556   MCH 30.6 10/10/2015 1556   MCHC 35.0 10/10/2015 1556   RDW 13.5 10/10/2015 1556   LYMPHSABS 2.6 10/10/2015 1556   MONOABS 0.7 10/10/2015 1556   EOSABS 0.2 10/10/2015 1556   BASOSABS 0.0 10/10/2015 1556    BMET    Component Value Date/Time   NA 142 01/28/2016 0841   K 3.7 01/28/2016  0841   CL 108 01/28/2016 0841   CO2 23 01/28/2016 0841   GLUCOSE 90 01/28/2016 0841   BUN 16 01/28/2016 0841   CREATININE 0.86 01/28/2016 0841   CALCIUM 8.6 01/28/2016 0841   GFRNONAA >60 10/10/2015 1556   GFRAA >60 10/10/2015 1556    BNP No results found for: BNP  ProBNP    Component Value Date/Time   PROBNP 43.0 12/23/2013 1304    Imaging: Dg Chest 2 View  Result Date: 07/04/2016 CLINICAL DATA:  Pneumonia.  Shortness of breath . EXAM: CHEST  2 VIEW COMPARISON:  06/20/2016 . FINDINGS: Stable cardiomegaly with normal pulmonary vascularity. No focal infiltrate. Stable elevation left hemidiaphragm. No pneumothorax. Diffuse osteopenia degenerative change thoracic spine . No acute bony abnormality identified . IMPRESSION: Stable cardiomegaly.  No focal infiltrate Electronically Signed   By: Marcello Moores  Register   On: 07/04/2016 09:35   Dg Chest 2 View  Result Date: 06/20/2016 CLINICAL DATA:  Productive cough. EXAM: CHEST  2 VIEW COMPARISON:  10/10/2015 . FINDINGS: Mediastinum and hilar structures are normal. Mild infiltrate left lung base. No pleural effusion or pneumothorax. Cardiomegaly with normal pulmonary vascularity. Thoracolumbar spine scoliosis. Surgical clips right upper quadrant. IMPRESSION: Left lower lobe infiltrate noted consistent with pneumonia. Electronically Signed   By: Marcello Moores  Register   On: 06/20/2016 12:33     Assessment & Plan:   No problem-specific Assessment & Plan notes found for this encounter.     Rexene Edison, NP 07/04/2016

## 2016-07-04 NOTE — Patient Instructions (Signed)
Continue on Symbicort 80 2 puffs Twice daily  , rinse after use  Mucinex Twice daily  As needed  Cough/congestion  Delsym 2 tsp Twice daily  As needed  Cough  Zyrtec 10mg  At bedtime  As needed  Drainage .  Continue on Protonix and Pepcid .  GERD At bedtime   Follow up with Dr.  In 3 months and As needed   Please contact office for sooner follow up if symptoms do not improve or worsen or seek emergency care

## 2016-07-04 NOTE — Assessment & Plan Note (Signed)
LLL PNA improved with abx CXR today shows clearance   Plan  Patient Instructions  Continue on Symbicort 80 2 puffs Twice daily  , rinse after use  Mucinex Twice daily  As needed  Cough/congestion  Delsym 2 tsp Twice daily  As needed  Cough  Zyrtec 10mg  At bedtime  As needed  Drainage .  Continue on Protonix and Pepcid .  GERD At bedtime   Follow up with Dr.  In 3 months and As needed   Please contact office for sooner follow up if symptoms do not improve or worsen or seek emergency care

## 2016-07-04 NOTE — Progress Notes (Signed)
Chart and office note reviewed in detail along with available xrays  > agree with a/p as outlined  

## 2016-07-04 NOTE — Assessment & Plan Note (Signed)
Recent flare now resolved   Plan  Patient Instructions  Continue on Symbicort 80 2 puffs Twice daily  , rinse after use  Mucinex Twice daily  As needed  Cough/congestion  Delsym 2 tsp Twice daily  As needed  Cough  Zyrtec 10mg  At bedtime  As needed  Drainage .  Continue on Protonix and Pepcid .  GERD At bedtime   Follow up with Dr.  In 3 months and As needed   Please contact office for sooner follow up if symptoms do not improve or worsen or seek emergency care

## 2016-07-05 ENCOUNTER — Telehealth: Payer: Self-pay | Admitting: *Deleted

## 2016-07-05 DIAGNOSIS — E785 Hyperlipidemia, unspecified: Secondary | ICD-10-CM

## 2016-07-05 NOTE — Telephone Encounter (Signed)
Would use atorvastatin 40 mg daily, will need lipids/LFTs in 2 months.  She can followup with Dr. Okey Dupre given my move to the CHF clinic.

## 2016-07-06 MED ORDER — ATORVASTATIN CALCIUM 40 MG PO TABS: 40.0000 mg | ORAL_TABLET | Freq: Every day | ORAL | 0 refills | Status: DC

## 2016-07-06 NOTE — Telephone Encounter (Signed)
Will route this message to Lipid Pharmacist for further review and recommendation.   Will route to Dr Alford Highland RN to follow-up on, on return to the office.

## 2016-07-06 NOTE — Telephone Encounter (Signed)
Patient Calls   Baird Lyons, RN routed conversation to Laurey Morale, MD; Jacqlyn Krauss, RN; [facility] Triage 4 minutes ago (8:40 AM)    You 8 minutes ago (8:37 AM)      Patient Calls   Laurey Morale, MD  You; Jacqlyn Krauss, RN; [facility] Triage 10 hours ago (9:54 PM)          Would use atorvastatin 40 mg daily, will need lipids/LFTs in 2 months. She can followup with Dr. Okey Dupre given my move to the CHF clinic.        Documentation        You  Laurey Morale, MD 21 hours ago (10:37 AM)         Dr. Shirlee Latch,   This pt's insurance is not covering her crestor at this time, they have ask for you to consider one step medications : Lovastatin, Simvastatin, Pravastatin or  Atorvastatin, also a different doctor was not assigned to her, please let me know what medication and what doctor you want to take over her care. Thanks  (Routing comment)       Changed the medication as advised, put in the lab orders and made appt with pt, will send to Dr Serita Kyle nurse to get the pt an appt & Margorie John as well      Documentation     You  [name] 8 minutes ago (8:36 AM)    Baird Lyons, RN 15 minutes ago (8:30 AM)      Pt states she had muscle aches/cramping on Atorvastatin.  She "had a real bad reaction to that med".  Reminds [place] that she could not take generic of Crestor either, only brand name. Will send back to Dr. Shirlee Latch for advisement of another alternative. Pt understands we will call her once he has responded. New Pt appt made to establish w/ Dr. Okey Dupre 2/26.      Documentation     Sherri Lady Deutscher, RN  [name] 15 minutes ago (8:29 AM)    Laurey Morale, MD  You; Jacqlyn Krauss, RN; [facility] Triage 10 hours ago (9:54 PM)      Would use atorvastatin 40 mg daily, will need lipids/LFTs in 2 months.  She can followup with Dr. Okey Dupre given my move to the CHF clinic.       Documentation     You  Laurey Morale, MD 22 hours ago (10:37 AM)      Dr. Shirlee Latch,   This pt's insurance is not covering her crestor at this time, they have ask for you to consider one step medications : Lovastatin, Simvastatin, Pravastatin or  Atorvastatin, also a different doctor was not assigned to her, please let me know what medication and what doctor you want to take over her care. Thanks  (Routing comment)    Dory Horn has spoken with the pt and I will inform her of the lab orders.

## 2016-07-06 NOTE — Telephone Encounter (Signed)
Would see if our lipid pharmacist can talk to her insurance.  She cannot take any statin other than Crestor.  She has severe CAD.  If they refuse to let her have Crestor, our only option will be Repatha and Praluent.  Makes no sense to do this, so hopefully her insurance will see the light and let her continue Crestor.

## 2016-07-06 NOTE — Telephone Encounter (Signed)
Pt states she had muscle aches/cramping on Atorvastatin.  She "had a real bad reaction to that med".  Reminds [place] that she could not take generic of Crestor either, only brand name. Will send back to Dr. Shirlee Latch for advisement of another alternative. Pt understands we will call her once he has responded. New Pt appt made to establish w/ Dr. Okey Dupre 2/26.

## 2016-07-06 NOTE — Telephone Encounter (Signed)
Patient Calls   [name] Morale, MD  You; Jacqlyn Krauss, RN; [facility] Triage 10 hours ago (9:54 PM)      Would use atorvastatin 40 mg daily, will need lipids/LFTs in 2 months.  She can followup with Dr. Okey Dupre given my move to the CHF clinic.       Documentation     You  [name] Morale, MD 21 hours ago (10:37 AM)    Dr. Shirlee Latch,   This pt's insurance is not covering her crestor at this time, they have ask for you to consider one step medications : Lovastatin, Simvastatin, Pravastatin or  Atorvastatin, also a different doctor was not assigned to her, please let me know what medication and what doctor you want to take over her care. Thanks  (Routing comment)      Changed the medication as advised, put in the lab orders and made appt with pt, will send to Dr Serita Kyle nurse to get the pt an appt & Margorie John as well

## 2016-07-06 NOTE — Telephone Encounter (Addendum)
Pt needs prior authorization for brand Crestor since she had myalgias with generic. Tried to submit one but the patient's insurance card that was scanned in on 06/20/16 is not active. Called patient and she said this is her old card. New insurance as follows:  [employer]- Member ID: [id number] GRP: [id number] BIN: [id number] PCN: [id number]  Tried to submit a prior [place] online with new insurance and it still failed.   Spoke to [place] with [facility] to submit PA over the phone. Should have decision faxed in 24-48 hours. Will send in rx at that time.

## 2016-07-08 MED ORDER — CRESTOR 20 MG PO TABS: 20.0000 mg | ORAL_TABLET | Freq: Every day | ORAL | 3 refills | Status: DC

## 2016-07-08 NOTE — Telephone Encounter (Signed)
RX for brand Crestor sent to pharmacy of choice.

## 2016-07-12 NOTE — Telephone Encounter (Signed)
PA was approved for brand Crestor. Copay is $85 per month with pt's new insurance for 2018.

## 2016-07-14 ENCOUNTER — Telehealth: Payer: Self-pay | Admitting: Adult Health

## 2016-07-14 MED ORDER — PREDNISONE 10 MG PO TABS: ORAL_TABLET | ORAL | 0 refills | Status: DC

## 2016-07-14 NOTE — Telephone Encounter (Signed)
Spoke with the pt and notified of recs per TP  She verbalized understanding  Rx sent to pharm

## 2016-07-14 NOTE — Telephone Encounter (Signed)
Called and spoke to pt. Pt c/o increase in SOB, prod cough with white to yellow mucus, fatigue x 1 week. Pt states she is using her albuterol neb twice a day, pt states she normally uses this once a day. Pt denies CP/tightness, f/c/s, no there pain. Pt last seen on 1.29.18 by TP and last seen by MW on 10.3.16.   TP please advise. Thanks.

## 2016-07-14 NOTE — Telephone Encounter (Signed)
Recent cxr showed resolved PNA  Make sure taking inahlers/meds Add mucinex dm. As needed   Can try short course of prednisone /steroids  Prednisone 10mg  #20 , 4 tabs for 2 days, then 3 tabs for 2 days, 2 tabs for 2 days, then 1 tab for 2 days, then stop  No refills If not better or worse , needs ov  Please contact office for sooner follow up if symptoms do not improve or worsen or seek emergency care

## 2016-07-20 ENCOUNTER — Telehealth: Payer: Self-pay

## 2016-07-20 ENCOUNTER — Telehealth: Payer: Self-pay | Admitting: Adult Health

## 2016-07-20 NOTE — Telephone Encounter (Signed)
PA for Symbicort 80-4.66mcg was started today. PA has been approved. Pharmacy is aware.

## 2016-07-20 NOTE — Telephone Encounter (Signed)
**Note De-Identified  Obfuscation** Crestor 10 mg approval from [employer] received. Approval from 07/06/2016 until 06/05/2017.

## 2016-07-22 ENCOUNTER — Encounter: Payer: Self-pay | Admitting: Internal Medicine

## 2016-07-31 NOTE — Progress Notes (Signed)
Follow-up Outpatient Visit Date: 08/01/2016  Primary Care Provider: Jarome Matin (Inactive) [facility]  Chief Complaint: Follow-up CAD  HPI:  [name] [name] is a 81 y.o. year-old female with history of multivessel CAD managed medically due to poor PCI and CABG targets, hypertension,PACs and PVCs, OSA not using CPAP, HBV, HCV, and GERD, who presents for follow-up of CAD. The patient was previously followed by Dr. Shirlee Latch and was last seen in our office by Ward Givens in 01/2016. She experienced recurrent chest pain in 02/2015 with intermediate-risk stress test similar to prior studies. Medical management was continued with improvement in chest pain. Today, she reports feeling better compared with her last visit in 01/2016. However, she has experienced increasing dyspnea on exertion over the last month. She also has experienced some fatigue, though this has been an intermittent problem for her over the course of many years. She was evaluated by her pulmonologist, Dr. Sherene Sires, this morning, who attributed some of her symptoms to incorrect use of her inhalers. Bugbee denies chest pain, though she did take a single sublingual nitroglycerin tablet last week due to a "tired" feeling in her chest. She denies orthopnea and ongoing leg swelling, though she had a brief episode of ankle swelling 1-2 months ago. This occurred the setting of right knee pain and resolves spontaneously after a few days.  The patient reports being compliant with her medications, though she seems somewhat unclear about her medication list. She occasionally checks her blood pressure at home, but believes that her blood pressure cuff is inaccurate. At times her home pressures are normal, though they can often be elevated as well.  --------------------------------------------------------------------------------------------------  Cardiovascular History & Procedures: Cardiovascular Problems:  Multivessel coronary artery disease  PACs and  PVCs  Risk Factors:  Known coronary artery disease, hypertension, and age > 10  Cath/PCI:  LHC (02/20/08): LMCA with 20% distal stenosis. LAD with serial 70% and 60% lesions in the midportion. Distal LAD is subtotally occluded. D1 with 50% stenosis. Ramus is small vessel with 60-70% mid stenosis. LCx with luminal irregularities. OM1 has 50% stenosis. Dominant RCA with proximal CTO. LVEF 60% with mid inferior hypokinesis. LVEDP 16 mmHg.  CV Surgery:  None  EP Procedures and Devices:  None  Non-Invasive Evaluation(s):  Pharmacologic myocardial perfusion stress test (02/19/15): Intermediate risk study with small in size, moderate in intensity reversible apical and apical inferior defect. Inferolateral wall is hypokinetic with LVEF 50%.  Recent CV Pertinent Labs: Lab Results  Component Value Date   CHOL 123 (L) 06/16/2015   HDL 61 06/16/2015   LDLCALC 50 06/16/2015   TRIG 59 06/16/2015   CHOLHDL 2.0 06/16/2015   INR 0.93 07/04/2010   K 3.7 01/28/2016   BUN 16 01/28/2016   CREATININE 0.86 01/28/2016    Past medical and surgical history were reviewed and updated in EPIC.  Outpatient Encounter Prescriptions as of 08/01/2016  Medication Sig  . albuterol (PROVENTIL) (2.5 MG/3ML) 0.083% nebulizer solution Take 3 mLs (2.5 mg total) by nebulization every 6 (six) hours as needed for wheezing or shortness of breath.  . budesonide-formoterol (SYMBICORT) 80-4.5 MCG/ACT inhaler Inhale 2 puffs into the lungs 2 (two) times daily.  . clopidogrel (PLAVIX) 75 MG tablet TAKE 1 TABLET (75 MG TOTAL) BY MOUTH DAILY.  Marland Kitchen CRESTOR 20 MG tablet Take 1 tablet (20 mg total) by mouth daily.  . ergocalciferol (VITAMIN D2) 50000 units capsule Take 50,000 Units by mouth once a week.  . famotidine (PEPCID) 20 MG tablet Take 20 mg by  mouth at bedtime.  . nitroGLYCERIN (NITROSTAT) 0.4 MG SL tablet Place 0.4 mg under the tongue every 5 (five) minutes as needed for chest pain.   . pantoprazole (PROTONIX) 20 MG  tablet Take 1 tablet (20 mg total) by mouth daily.  . ranitidine (ZANTAC) 75 MG tablet Take 75 mg by mouth 2 (two) times daily.  . valsartan (DIOVAN) 320 MG tablet Take 1 tablet (320 mg total) by mouth daily.  . [DISCONTINUED] predniSONE (DELTASONE) 10 MG tablet 4 x 2 days, 3 x 2 days, 2 x 2 days, 1 x 2 days, then stop   No facility-administered encounter medications on file as of 08/01/2016.     Allergies: Aspirin; Clarithromycin; Doxycycline; Pravastatin; Levaquin [levofloxacin in d5w]; and Penicillins  Social History   Social History  . Marital status: Married    Spouse name: [name] [name]  . Number of children: 4  . Years of education: 12   Occupational History  . retired  Retired    [employer]   Social History Main Topics  . Smoking status: Never Smoker  . Smokeless tobacco: Never Used     Comment: 2nd hand exposure x40 years   . Alcohol use No  . Drug use: No  . Sexual activity: Not Currently   Other Topics Concern  . Not on file   Social History Narrative   Married, lives with husband; has children.   Retired Government social research officer.       Cell # [phone]; okay to leave a message.           Family History  Problem Relation Age of Onset  . Asthma Mother   . Arthritis Mother     rheumatiod arthritis  . Stroke Mother   . Asthma Sister   . Stroke Sister   . COPD Sister   . Emphysema Sister   . Colon cancer Maternal Aunt   . Lung cancer Maternal Aunt   . Pancreatic cancer Brother     Review of Systems: A 12-system review of systems was performed and was negative except as noted in the HPI.  --------------------------------------------------------------------------------------------------  Physical Exam: BP (!) 158/98 (BP Location: Right Arm, Patient Position: Sitting, Cuff Size: Large)   Pulse 76   Ht 5\' 1"  (1.549 m)   Wt 186 lb 6.4 oz (84.6 kg)   SpO2 95%   BMI 35.22 kg/m   General:  Obese woman, seated comfortably in the exam room. HEENT:  No conjunctival pallor or scleral icterus.  Moist mucous membranes.  OP clear. Neck: Supple without lymphadenopathy, thyromegaly, JVD, or HJR.  No carotid bruit. Lungs: Normal work of breathing.  Clear to auscultation bilaterally without wheezes or crackles. Heart: Regular rate and rhythm without murmurs, rubs, or gallops.  Non-displaced PMI. Abd: Bowel sounds present.  Soft, NT/ND without hepatosplenomegaly Ext: No lower extremity edema.  Radial, PT, and DP pulses are 2+ bilaterally. Skin: warm and dry without rash  EKG:  Normal sinus rhythm with first-degree AV block, inferior Q waves, poor R-wave progression in the anterolateral leads. No significant change from prior tracing on 10/10/15 (I have personally reviewed both tracings).  Lab Results  Component Value Date   WBC 7.9 10/10/2015   HGB 14.6 10/10/2015   HCT 41.7 10/10/2015   MCV 87.4 10/10/2015   PLT 165 10/10/2015    Lab Results  Component Value Date   NA 142 01/28/2016   K 3.7 01/28/2016   CL 108 01/28/2016   CO2 23 01/28/2016   BUN  16 01/28/2016   CREATININE 0.86 01/28/2016   GLUCOSE 90 01/28/2016   ALT 15 10/10/2015    Lab Results  Component Value Date   CHOL 123 (L) 06/16/2015   HDL 61 06/16/2015   LDLCALC 50 06/16/2015   TRIG 59 06/16/2015   CHOLHDL 2.0 06/16/2015    --------------------------------------------------------------------------------------------------  ASSESSMENT AND PLAN: Coronary artery disease and shortness of breath Patient has had multivessel CAD for at least 10 years, that has been managed medically due to poor revascularization options. Overall, she reports feeling better than at her last visit, though she has noted some increased exertional dyspnea and vague chest discomfort. Her symptoms could be due to either worsening coronary insufficiency or underlying lung disease with incorrect use of her inhalers. We have discussed further management options, including repeating a stress test and  titration of her medications. I feel that her uncontrolled hypertension certainly could be contributing to her symptoms. Given her underlying lung disease and concern for bronchospasm with beta blockers and asthma, we have agreed to defer adding a beta blocker at this time. We will start hydrochlorothiazide 25 mg daily. If her symptoms persist despite improved blood pressure control and appropriate use of her inhalers, we could adding isosorbide mononitrate or a calcium channel blocker. We will also check a CBC today, at the request of Dr. Sherene Sires, in case anemia is contributing to her progressive dyspnea, as [name] [name] is on chronic clopidogrel therapy due to aspirin intolerance.  Hypertension Blood pressure is poorly controlled today. As above, we will start hydrochlorothiazide 25 mg daily and continue her current dose of valsartan. We will check a basic metabolic panel today and again in 2-4 weeks.  Hyperlipidemia Patient currently on rosuvastatin 20 mg daily. I will check a lipid panel today to ensure that her lipids remain adequately controlled.  Follow-up: Return to clinic in 3 months.  [name], MD 08/01/2016 4:08 PM

## 2016-08-01 ENCOUNTER — Ambulatory Visit (INDEPENDENT_AMBULATORY_CARE_PROVIDER_SITE_OTHER): Payer: PPO | Admitting: Internal Medicine

## 2016-08-01 ENCOUNTER — Encounter: Payer: Self-pay | Admitting: Internal Medicine

## 2016-08-01 VITALS — BP 142/90 | HR 88 | Ht 61.0 in | Wt 185.0 lb

## 2016-08-01 VITALS — BP 158/98 | HR 76 | Ht 61.0 in | Wt 186.4 lb

## 2016-08-01 DIAGNOSIS — J45991 Cough variant asthma: Secondary | ICD-10-CM | POA: Diagnosis not present

## 2016-08-01 DIAGNOSIS — I10 Essential (primary) hypertension: Secondary | ICD-10-CM

## 2016-08-01 DIAGNOSIS — E785 Hyperlipidemia, unspecified: Secondary | ICD-10-CM

## 2016-08-01 DIAGNOSIS — I2510 Atherosclerotic heart disease of native coronary artery without angina pectoris: Secondary | ICD-10-CM | POA: Diagnosis not present

## 2016-08-01 DIAGNOSIS — R0602 Shortness of breath: Secondary | ICD-10-CM | POA: Diagnosis not present

## 2016-08-01 LAB — NITRIC OXIDE: Nitric Oxide: 48

## 2016-08-01 MED ORDER — HYDROCHLOROTHIAZIDE 25 MG PO TABS: 25.0000 mg | ORAL_TABLET | Freq: Every day | ORAL | 1 refills | Status: DC

## 2016-08-01 NOTE — Assessment & Plan Note (Addendum)
PFTs in 03/2009 were essentially nml, no obstruction, nml lung volumes & diffusion - improved symptoms p stopped losartan 04/01/14 - 04/28/2014 p extensive coaching HFA effectiveness =    90%  - symptoms improved off all rx 10/16/14 so rec prn saba only  - symptoms flared over the summer of 2016 so 03/09/2015  >> rec restart dulera 100 2 every 12h  - flared of symbicort 08/01/2016 With FEN0 = 48 and spirometry uninterpretable >> try 80 2bid - 08/01/2016  After extensive coaching HFA effectiveness =    75% from baseline of 0   Although many of her symptoms are chronic and non-specific, the response to nebs and FENO elevation do support an asthma component so reasonable to rechallenge with symb 80 x 2 week trial then return with all meds in hand using a trust but verify approach to confirm accurate Medication  Reconciliation The principal here is that until we are certain that the  patients are doing what we've asked, it makes no sense to ask them to do more.   I had an extended discussion with the patient reviewing all relevant studies completed to date and  lasting 25 minutes of a 40  minute acute visit to re-establish re chronic non-specific but potentially very serious refractory respiratory symptoms of unknown etiology.  Each maintenance medication was reviewed in detail including most importantly the difference between maintenance and prns and under what circumstances the prns are to be triggered using an action plan format that is not reflected in the computer generated alphabetically organized AVS.    Please see AVS for specific instructions unique to this office visit that I personally wrote and verbalized to the the pt in detail and then reviewed with pt  by my nurse highlighting any changes in therapy/plan of care  recommended at today's visit.

## 2016-08-01 NOTE — Progress Notes (Signed)
Subjective:    Patient ID: [name] [name], female    DOB: 01/11/1936,    MRN: [id number]    Brief patient profile:  38 yobf  never smoker with CAD for FU of reactive airway disease - trigger ? GERD & mild AS / with completely nl lung function during flare 09/30/2014   10/29/09 - initial  This was of insidious onset - - She had an ER visit during a trip to [place] - echo nml LV fn, BNP 55, stress myoview nml, given cipro for 'bronchitis.'  CXR 08/10/09 shows bibasal interstitial prominence, lt volume loss with raised hemidiaphragm  CT angio chest 01/2008 was neg for PE, stable RUL 71m nodule as compared to 05/2005, no obvious fibrosis. CT sinuses 8/11 nml  She reports intermittent heartburn with protonix , had an EGD by Dr MCollene Maresin 11/10  PFTs in 03/2009 were essentially nml, no obstruction, nml lung volumes & diffusion.  [facility] Spirometry 6/11 shows nml lung function, RAST neg, Labs showed neg ANA, and low ESR. RA factor was elevated.  ER visit on 01/09/10 - absolute eos count 800 ,BNP 69, CXR - cardiomegaly, was on prednisone .  Sep, 2011 >> Referred to Rheum (Ouida Sills - favors OA rather than RA, CCP neg, CK was high 709 - muscle cramps, crestor stopped Stopped advair x 2 weeks - 'ran out', beta blockers stopped     01/10/14 [name] eval  Key is to keep reflux under control Stay on protonix daytime & pepcid at night Keep appt with Dr MCollene MaresPrednisone 10 mg tabs  Take 2 tabs daily with food x 7ds, then 1 tab daily with food x 7ds then STOP  # 40 Stay on symbicort Albuterol MDI 2 puffs upto 4 times daily as needed for wheezing Start singulair 10 mg at bedtime> never started it    03/31/2014 f/u ov/Mumin Denomme re: ? Asthma  Chief Complaint  Patient presents with  . Follow-up    Pt states that her breathing is doing better since the last visit. She states does not have rescue inhaler, and has neb but has not needed since last visit.   maintaining protonix 40 mg  ac and pepcid at bedtime but easily confused with names of meds and instructions rec Continue symbicort 160 Take 2 puffs first thing in am and then another 2 puffs about 12 hours later.  Work on inhaler technique:    Only use your albuterol as a rescue medication  Stop cozar and take diovan (valsartan) 160 mg one daily          04/28/2014 f/u ov/Lilie Vezina re: cough/ throat clearing/ ? Pseudoasthma Chief Complaint  Patient presents with  . Follow-up    Pt states that her breathing seems to be doing better since her last visit. No wheezing, cough or need for rescue inhaler or neb.   symbicort 160 2bid and no need rescue at all, Not limited by breathing from desired activities  rec When you finish the symbicort 160 switch to the 80 Take 2 puffs first thing in am and then another 2 puffs about 12 hours later.   06/09/2014 f/u ov/Jaquavion Mccannon re: asthma vs uacs no longer on any inhalers at all x one month and doing well  Chief Complaint  Patient presents with  . Follow-up    Pt stats that her breathing is doing great. She has not had to use rescue inhaler since the last visit. She states since she  started on valsartan end of Oct 2015 she has noticed joint pain and loss of appetite.   Not limited by breathing from desired activities   rec F/u prn       03/09/2015  Extended f/u ov/Shanterica Biehler re: chronic cough with flare x  12/2014   Chief Complaint  Patient presents with  . Follow-up    C/o increase SOB, prod cough (white phlem), wheezing, chest tx.   Never got pred, never called to let us know the pharmacy did not receive rx  , never got h1, using spearmint/ not taking gabapentin or delsym as directed  Does respond temporarily to saba neb and looking back she may have responded to ICS though hfa is poor  (see a/p) rec Pantoprazole (protonix) 40 mg   Take  30-60 min before first meal of the day and Pepcid (famotidine)  20 mg one @  bedtime until return to office - this is the best way to tell whether  stomach acid is contributing to your problem.   Prednisone 10 mg take  4 each am x 2 days,   2 each am x 2 days,  1 each am x 2 days and stop  Gabapentin 100 mg three times daily  dulera 100  Take 2 puffs first thing in am and then another 2 puffs about 12 hours later.  For short of breath/ wheeze > albuterol neb every 4 hours as needed  For drainage take chlortrimeton (chlorpheniramine) 4 mg every 4 hours available over the counter (may cause drowsiness) Take delsym two tsp every 12 hours and supplement if needed with  tramadol 50 mg up to 2 every 4 hours  Once you have eliminated the cough for 3 straight days try reducing the tramadol first,  then the delsym as tolerated.    GERD (REFLUX)  See me in 2 weeks with all your medications > did not do, does not remember if these recs worked     08/01/2016 acute extended ov/Tylee Newby re:  Intermittent chest tight x two months/  Maintaining on ppi/ pepcid daily and no maint pulmonary  meds/ has symbicort but using neb instead which seems to help "for a few days"  Chief Complaint  Patient presents with  . Acute Visit    Breathing is progressively worse since her last visit. She states she tires very easily.   no sign cough/ not limited by breathing but but "tired in my chest" and R knee pain  Severe fatigue since ?  One year  Sleeping really well s noct cough/sob   Last neb was last week no increase of symbicort / extremely confused again with meds   No obvious patterns in day to day or daytime variability or assoc excess/ purulent sputum or mucus plugs or hemoptysis or cp or chest tightness, subjective wheeze or overt sinus or hb symptoms. No unusual exp hx or h/o childhood pna/ asthma or knowledge of premature birth.  Sleeping ok without nocturnal  or early am exacerbation  of respiratory  c/o's or need for noct saba. Also denies any obvious fluctuation of symptoms with weather or environmental changes or other aggravating or alleviating factors except  as outlined above   Current Medications, Allergies, Complete Past Medical History, Past Surgical History, Family History, and Social History were reviewed in [employer] record.  ROS  The following are not active complaints unless bolded sore throat, dysphagia, dental problems, itching, sneezing,  nasal congestion or excess/ purulent secretions, ear ache,  fever, chills, sweats, unintended wt loss, classically pleuritic or exertional cp,  orthopnea pnd or leg swelling, presyncope, palpitations, abdominal pain, anorexia, nausea, vomiting, diarrhea  or change in bowel or bladder habits, change in stools or urine, dysuria,hematuria,  rash, arthralgias R knee > needs TKR , visual complaints, headache, numbness, weakness or ataxia or problems with walking or coordination,  change in mood/affect or memory.             Objective:  Physical Exam  amb bf nad  / extremely difficult hx   04/28/2014      183 >  06/09/13 178 > 09/30/2014  181> 10/16/2014   182 > 02/23/2015  188 > 03/09/2015   192 > 08/01/2016   185  Wt Readings from Last 3 Encounters:  03/31/14 187 lb (84.823 kg)  02/17/14 187 lb (84.823 kg)  01/10/14 188 lb 6.4 oz (85.458 kg)     Vital signs reviewed - Note on arrival 02 sats  100% on RA   - note bp 142/90    HEENT: full upper, lower partial dentures, turbinates, and orophanx. Nl external ear canals without cough reflex   NECK :  without JVD/Nodes/TM/ nl carotid upstrokes bilaterally   LUNGS: no acc muscle use, clear to A and P bilaterally without cough on insp or exp maneuvers   CV:  RRR  no s3,  II/VI SEM  no increase in P2, no edema   ABD:  soft and nontender with nl excursion in the supine position. No bruits or organomegaly, bowel sounds nl  MS:  warm without deformities, calf tenderness, cyanosis or clubbing      I personally reviewed images and agree with radiology impression as follows:  CXR:   07/04/16 Stable cardiomegaly.  No focal  infiltrate             Assessment & Plan:

## 2016-08-01 NOTE — Assessment & Plan Note (Signed)
Apparently not well controlled with h/o AS which could be contributing to some of her symptoms but hard to tell because they are so non-specific and really the R knee stops her before her breathing  Has cards f/u later today > defer rx to Cards but would include basic labs  see avs for instructions unique to this ov

## 2016-08-01 NOTE — Assessment & Plan Note (Signed)
  Body mass index is 34.96 trending down/ encouraged  Lab Results  Component Value Date   TSH 2.18 06/09/2014     Contributing to gerd risk/ doe/reviewed the need and the process to achieve and maintain neg calorie balance > defer f/u primary care including intermittently monitoring thyroid status

## 2016-08-01 NOTE — Patient Instructions (Signed)
Medication Instructions:  Start HCTZ (hydrochlorothiazide) 25mg  daily  Labwork: CMET/CBCd/TSH/Lipid profile today  Testing/Procedures: Your physician has requested that you have an echocardiogram. Echocardiography is a painless test that uses sound waves to create images of your heart. It provides your doctor with information about the size and shape of your heart and how well your heart's chambers and valves are working. This procedure takes approximately one hour. There are no restrictions for this procedure.    Follow-Up: Your physician recommends that you schedule a follow-up appointment in: 3 months with Dr End.         If you need a refill on your cardiac medications before your next appointment, please call your pharmacy.

## 2016-08-01 NOTE — Patient Instructions (Addendum)
Restart symbicort 80 Take 2 puffs first thing in am and then another 2 puffs about 12 hours later  Work on inhaler technique:  relax and gently blow all the way out then take a nice smooth deep breath back in, triggering the inhaler at same time you start breathing in.  Hold for up to 5 seconds if you can. Blow out thru nose. Rinse and gargle with water when done     If not better in two weeks return in 2 weeks with all medications in two bags, the ones you take daily and the one you use only as needed   To evaluate your fatigue you will need TSH CBC and BMET at a minimum but since you are seeing cardiology today and they may want additional studies, you can have all these then

## 2016-08-02 LAB — COMPREHENSIVE METABOLIC PANEL
ALT: 8 IU/L (ref 0–32)
AST: 13 IU/L (ref 0–40)
Albumin/Globulin Ratio: 1.5 (ref 1.2–2.2)
Albumin: 3.9 g/dL (ref 3.5–4.8)
Alkaline Phosphatase: 71 IU/L (ref 39–117)
BUN/Creatinine Ratio: 18 (ref 12–28)
BUN: 16 mg/dL (ref 8–27)
Bilirubin Total: <0.2 mg/dL (ref 0.0–1.2)
CO2: 22 mmol/L (ref 18–29)
Calcium: 9.1 mg/dL (ref 8.7–10.3)
Chloride: 105 mmol/L (ref 96–106)
Creatinine, Ser: 0.89 mg/dL (ref 0.57–1.00)
GFR calc Af Amer: 72 mL/min/{1.73_m2} (ref >59)
GFR calc non Af Amer: 62 mL/min/{1.73_m2} (ref >59)
Globulin, Total: 2.6 g/dL (ref 1.5–4.5)
Glucose: 101 mg/dL — ABNORMAL HIGH (ref 65–99)
Potassium: 3.8 mmol/L (ref 3.5–5.2)
Sodium: 146 mmol/L — ABNORMAL HIGH (ref 134–144)
Total Protein: 6.5 g/dL (ref 6.0–8.5)

## 2016-08-02 LAB — LIPID PANEL
Chol/HDL Ratio: 4.7 ratio units — ABNORMAL HIGH (ref 0.0–4.4)
Cholesterol, Total: 187 mg/dL (ref 100–199)
HDL: 40 mg/dL (ref >39)
LDL Calculated: 103 mg/dL — ABNORMAL HIGH (ref 0–99)
Triglycerides: 220 mg/dL — ABNORMAL HIGH (ref 0–149)
VLDL Cholesterol Cal: 44 mg/dL — ABNORMAL HIGH (ref 5–40)

## 2016-08-02 LAB — CBC WITH DIFFERENTIAL/PLATELET
Basophils Absolute: 0 10*3/uL (ref 0.0–0.2)
Basos: 0 %
EOS (ABSOLUTE): 0.3 10*3/uL (ref 0.0–0.4)
Eos: 5 %
Hematocrit: 39.2 % (ref 34.0–46.6)
Hemoglobin: 13.2 g/dL (ref 11.1–15.9)
Immature Grans (Abs): 0 10*3/uL (ref 0.0–0.1)
Immature Granulocytes: 0 %
Lymphocytes Absolute: 2.4 10*3/uL (ref 0.7–3.1)
Lymphs: 36 %
MCH: 29.9 pg (ref 26.6–33.0)
MCHC: 33.7 g/dL (ref 31.5–35.7)
MCV: 89 fL (ref 79–97)
Monocytes Absolute: 0.7 10*3/uL (ref 0.1–0.9)
Monocytes: 11 %
Neutrophils Absolute: 3.2 10*3/uL (ref 1.4–7.0)
Neutrophils: 48 %
Platelets: 153 10*3/uL (ref 150–379)
RBC: 4.41 x10E6/uL (ref 3.77–5.28)
RDW: 13.9 % (ref 12.3–15.4)
WBC: 6.6 10*3/uL (ref 3.4–10.8)

## 2016-08-02 LAB — TSH: TSH: 2.2 u[IU]/mL (ref 0.450–4.500)

## 2016-08-03 ENCOUNTER — Telehealth: Payer: Self-pay | Admitting: *Deleted

## 2016-08-03 ENCOUNTER — Encounter: Payer: Self-pay | Admitting: Internal Medicine

## 2016-08-03 DIAGNOSIS — I10 Essential (primary) hypertension: Secondary | ICD-10-CM

## 2016-08-03 MED ORDER — ROSUVASTATIN CALCIUM 40 MG PO TABS: 40.0000 mg | ORAL_TABLET | Freq: Every day | ORAL | 1 refills | Status: DC

## 2016-08-03 NOTE — Telephone Encounter (Signed)
-----   Message from [name], MD sent at 08/03/2016  7:59 AM EST ----- Regarding: Repeat labs and BP check Hi Domini Vandehei,  I was just looking over my visit with Mrs. Herrig and think that we should have her come back in 2-4 weeks for a repeat basic metabolic panel and blood pressure check. Do you mind touching base with the patient to arrange this? Thanks for your help.  Thayer Ohm

## 2016-08-03 NOTE — Telephone Encounter (Signed)
Notes Recorded by [name], MD on 08/02/2016 at 2:51 PM EST Please let [name] [name] know that her kidney function, electrolytes, blood counts, and thyroid studies are normal. Her LDL (bad cholesterol) is elevated above our goal (and above her previous readings). She should confirm that she is indeed taking rosuvastatin 20 mg daily. If she is not, I encourage her to restart at this dose. If she has been been taking it, we will need to increase it to 40 mg daily. Thanks.

## 2016-08-03 NOTE — Telephone Encounter (Signed)
I spoke with patient, she is going to return 08/24/16 for BMET, [facility] to arrange BP clinic appt the same day.

## 2016-08-05 ENCOUNTER — Other Ambulatory Visit: Payer: Self-pay

## 2016-08-05 ENCOUNTER — Ambulatory Visit (HOSPITAL_COMMUNITY): Payer: PPO | Attending: Cardiovascular Disease

## 2016-08-05 DIAGNOSIS — I2510 Atherosclerotic heart disease of native coronary artery without angina pectoris: Secondary | ICD-10-CM | POA: Diagnosis not present

## 2016-08-05 DIAGNOSIS — R0602 Shortness of breath: Secondary | ICD-10-CM

## 2016-08-05 DIAGNOSIS — E785 Hyperlipidemia, unspecified: Secondary | ICD-10-CM | POA: Diagnosis not present

## 2016-08-05 DIAGNOSIS — E669 Obesity, unspecified: Secondary | ICD-10-CM | POA: Insufficient documentation

## 2016-08-05 DIAGNOSIS — G4733 Obstructive sleep apnea (adult) (pediatric): Secondary | ICD-10-CM | POA: Insufficient documentation

## 2016-08-05 DIAGNOSIS — Z6835 Body mass index (BMI) 35.0-35.9, adult: Secondary | ICD-10-CM | POA: Insufficient documentation

## 2016-08-05 DIAGNOSIS — I10 Essential (primary) hypertension: Secondary | ICD-10-CM | POA: Diagnosis not present

## 2016-08-08 ENCOUNTER — Telehealth: Payer: Self-pay | Admitting: Internal Medicine

## 2016-08-08 NOTE — Telephone Encounter (Signed)
Discussed recent echo results with patient. 

## 2016-08-08 NOTE — Telephone Encounter (Signed)
New Message     Pt returning [place] call about CT results please call

## 2016-08-19 ENCOUNTER — Telehealth: Payer: Self-pay | Admitting: Internal Medicine

## 2016-08-19 MED ORDER — PREDNISONE 10 MG PO TABS: ORAL_TABLET | ORAL | 0 refills | Status: DC

## 2016-08-19 NOTE — Telephone Encounter (Signed)
Spoke with pt, aware of recs.  rx sent to preferred pharmacy.  Nothing further needed.  

## 2016-08-19 NOTE — Telephone Encounter (Signed)
Ok to try Prednisone 10 mg take  4 each am x 2 days,   2 each am x 2 days,  1 each am x 2 days and stop but to UC or ER if worsen prior to return to me or Tammy NP first avail to regroup

## 2016-08-19 NOTE — Telephone Encounter (Signed)
Spoke with pt. States that she is having a flare up. Reports increased cough, wheezing and chest congestion. Cough is producing thick, white mucus. Denies chest tightness, SOB or fever/chills/sweats. Pt states that her husband had an appointment yesterday at 9:30am with MW, she came in trying to use his appointment as he didn't need it and was told that she couldn't do that and was turned away by our front desk staff. I apologized to the pt for this and advised her that this could have been done and she could have been seen yesterday. We do not have any available appointments with any provider today.  MW - please advise. Thanks.

## 2016-08-24 ENCOUNTER — Other Ambulatory Visit: Payer: PPO

## 2016-08-24 ENCOUNTER — Ambulatory Visit: Payer: PPO | Admitting: Pharmacist

## 2016-08-24 NOTE — Progress Notes (Deleted)
Patient ID: [name] [name]                 DOB: February 05, 1936                      MRN: [id number]     HPI: [name] [name] is a 81 y.o. female referred by Dr. Okey Dupre to HTN clinic. She has a PMH of multivessel CAD managed medically due to poor PCI and CABG targets, hypertension, chest pain, PACs and PVCs, OSA not using CPAP, HBV, HCV, and GERD. At her appointment with Dr. Okey Dupre 1 month ago, she noted increased shortness of breath, but it was determined by her pulmonologist that it could be attributed to incorrect inhaler technique. She denied orthopnea or ankle swelling. At that visit, HCTZ 25 mg daily was started.   Current HTN meds: Valsartan 320 mg daily, HCTZ 25 mg daily   Previously tried: losartan 50 and 100 mg   BP goal: <130/80  Family History:   Social History:   Diet:   Exercise:   Home BP readings:   Wt Readings from Last 3 Encounters:  08/01/16 186 lb 6.4 oz (84.6 kg)  08/01/16 185 lb (83.9 kg)  07/04/16 186 lb 3.2 oz (84.5 kg)   BP Readings from Last 3 Encounters:  08/01/16 (!) 158/98  08/01/16 (!) 142/90  07/04/16 138/80   Pulse Readings from Last 3 Encounters:  08/01/16 76  08/01/16 88  07/04/16 76    Renal function: CrCl cannot be calculated (Patient's most recent lab result is older than the maximum 21 days allowed.).  Past Medical History:  Diagnosis Date  . Arthritis   . CAD (coronary artery disease)    a. 02/2008 Cath: EF 60%, RCA 100 w/ L->R collats, LM 20d, RI 60-70, small, LAD 60/96m, 99d, D1 50, OM1 50. Poor distal targets for CABG/no good interventional target -> medical Rx; b. [place]oview in [place] in 3/11 -nl;  c. 07/2011 MV: EF 67%, mild inf ischemia->Med Rx; 02/2015 MV: EF 50%, small, mod intensity apical and apical inf rev defect-->Med Rx.  . Chronic cough    hx; ACEI stopped, possible contribution from GERD  . Depression   . Dyspnea    a. 08/2013 Echo: EF 60-65%, mild AS;  b. PFTs (10/10): FVC 103%, ratio 74%. mild obstruction; c. CT chest (6/11) no  evidence for interstitial lung dz. possible asthma/upper airway cough sydrome. aspirin & beta blockers- potential causes for bronchospasm;  . GERD (gastroesophageal reflux disease)   . HBV (hepatitis B virus) infection   . HCV (hepatitis C virus)   . History of PAC's    hx  . History of PVC's    hx  . HLD (hyperlipidemia)    unable to tolerate most statins due to myalgias and elevated CK. thinks pravastatin casued a rash. Only tolerates crestor.  . Hypertensive heart disease   . IBS (irritable bowel syndrome)   . Obesity   . Osteoarthritis    knee; s/p TKR  . Osteoporosis   . Pancreatitis    hx  . PUD (peptic ulcer disease)   . Sleep apnea    diagnosed in 2011 , did not want CPAP -     Current Outpatient Prescriptions on File Prior to Visit  Medication Sig Dispense Refill  . albuterol (PROVENTIL) (2.5 MG/3ML) 0.083% nebulizer solution Take 3 mLs (2.5 mg total) by nebulization every 6 (six) hours as needed for wheezing or shortness of breath. 270 mL 0  .  budesonide-formoterol (SYMBICORT) 80-4.5 MCG/ACT inhaler Inhale 2 puffs into the lungs 2 (two) times daily. 1 Inhaler 5  . clopidogrel (PLAVIX) 75 MG tablet TAKE 1 TABLET (75 MG TOTAL) BY MOUTH DAILY. 90 tablet 0  . ergocalciferol (VITAMIN D2) 50000 units capsule Take 50,000 Units by mouth once a week.    . famotidine (PEPCID) 20 MG tablet Take 20 mg by mouth at bedtime.    . hydrochlorothiazide (HYDRODIURIL) 25 MG tablet Take 1 tablet (25 mg total) by mouth daily. 90 tablet 1  . nitroGLYCERIN (NITROSTAT) 0.4 MG SL tablet Place 0.4 mg under the tongue every 5 (five) minutes as needed for chest pain.     . pantoprazole (PROTONIX) 20 MG tablet Take 1 tablet (20 mg total) by mouth daily. 30 tablet 0  . predniSONE (DELTASONE) 10 MG tablet 40mg X2 days, 20mg  X2 days, 10mg X2 days, then stop. 14 tablet 0  . rosuvastatin (CRESTOR) 40 MG tablet Take 1 tablet (40 mg total) by mouth daily. 90 tablet 1  . valsartan (DIOVAN) 320 MG tablet Take 1  tablet (320 mg total) by mouth daily. 30 tablet 8  . [DISCONTINUED] atorvastatin (LIPITOR) 40 MG tablet Take 1 tablet (40 mg total) by mouth daily. 90 tablet 0   No current facility-administered medications on file prior to visit.     Allergies  Allergen Reactions  . Aspirin Nausea Only  . Clarithromycin Other (See Comments)    unknown  . Doxycycline Nausea Only  . Pravastatin Itching    REACTION: itching  . Levaquin [Levofloxacin In D5w] Rash    Rash, HA  . Penicillins Itching and Rash    Other reaction(s): Unknown Has patient had a PCN reaction causing immediate rash, facial/tongue/throat swelling, SOB or lightheadedness with hypotension: No Has patient had a PCN reaction causing severe rash involving mucus membranes or skin necrosis: No Has patient had a PCN reaction that required hospitalization No Has patient had a PCN reaction occurring within the last 10 years: No If all of the above answers are "NO", then may proceed with Cephalosporin use.      Assessment/Plan: Hypertension:     Patient was seen with Catie , PharmD Candidate 2018  Tkai Large E. Rafiel Mecca, PharmD, CPP, BCACP [facility] [address] [place], [address] [address] Phone: [phone]; Fax: [phone] 08/24/2016 8:53 AM

## 2016-08-25 DIAGNOSIS — K5730 Diverticulosis of large intestine without perforation or abscess without bleeding: Secondary | ICD-10-CM | POA: Diagnosis not present

## 2016-08-25 DIAGNOSIS — E669 Obesity, unspecified: Secondary | ICD-10-CM | POA: Diagnosis not present

## 2016-08-25 DIAGNOSIS — K219 Gastro-esophageal reflux disease without esophagitis: Secondary | ICD-10-CM | POA: Diagnosis not present

## 2016-08-29 ENCOUNTER — Other Ambulatory Visit: Payer: PPO | Admitting: *Deleted

## 2016-08-29 DIAGNOSIS — I10 Essential (primary) hypertension: Secondary | ICD-10-CM

## 2016-08-30 LAB — BASIC METABOLIC PANEL
BUN/Creatinine Ratio: 20 (ref 12–28)
BUN: 18 mg/dL (ref 8–27)
CO2: 25 mmol/L (ref 18–29)
Calcium: 9 mg/dL (ref 8.7–10.3)
Chloride: 103 mmol/L (ref 96–106)
Creatinine, Ser: 0.9 mg/dL (ref 0.57–1.00)
GFR calc Af Amer: 71 mL/min/{1.73_m2} (ref >59)
GFR calc non Af Amer: 61 mL/min/{1.73_m2} (ref >59)
Glucose: 109 mg/dL — ABNORMAL HIGH (ref 65–99)
Potassium: 3.9 mmol/L (ref 3.5–5.2)
Sodium: 144 mmol/L (ref 134–144)

## 2016-09-05 ENCOUNTER — Encounter: Payer: Self-pay | Admitting: Pharmacist

## 2016-09-05 ENCOUNTER — Ambulatory Visit (INDEPENDENT_AMBULATORY_CARE_PROVIDER_SITE_OTHER): Payer: PPO | Admitting: Pharmacist

## 2016-09-05 VITALS — BP 124/82 | HR 85

## 2016-09-05 DIAGNOSIS — I10 Essential (primary) hypertension: Secondary | ICD-10-CM | POA: Diagnosis not present

## 2016-09-05 NOTE — Patient Instructions (Addendum)
Return for a follow up appointment in 4-6 weeks  Check your blood pressure at home daily (if able) and keep record of the readings.  Take your BP meds as follows: CONTINUE Valsartan and Hydrochlorothiaze  STOP Rosuvastatin 40mg  for 2 weeks then restart at 20mg  (1/2 tablet) daily   Bring all of your meds, your BP cuff and your record of home blood pressures to your next appointment.  Exercise as you're able, try to walk approximately 30 minutes per day.  Keep salt intake to a minimum, especially watch canned and prepared boxed foods.  Eat more fresh fruits and vegetables and fewer canned items.  Avoid eating in fast food restaurants.    HOW TO TAKE YOUR BLOOD PRESSURE: . Rest 5 minutes before taking your blood pressure. .  Don't smoke or drink caffeinated beverages for at least 30 minutes before. . Take your blood pressure before (not after) you eat. . Sit comfortably with your back supported and both feet on the floor (don't cross your legs). . Elevate your arm to heart level on a table or a desk. . Use the proper sized cuff. It should fit smoothly and snugly around your bare upper arm. There should be enough room to slip a fingertip under the cuff. The bottom edge of the cuff should be 1 inch above the crease of the elbow. . Ideally, take 3 measurements at one sitting and record the average.

## 2016-09-05 NOTE — Progress Notes (Signed)
Patient ID: [name] [name]                 DOB: [birth year]                      MRN: [id number]     HPI: [name] [name] is a 81 y.o. female patient of Dr. Okey Dupre who presents today for hypertension evaluation. PMH includes multivessel CAD managed medically due to poor PCI and CABG targets, hypertension,PACs and PVCs, OSA not using CPAP, HBV, HCV, and GERD. At her most recent OV with Dr. Okey Dupre she was started on HCTZ 25mg  daily. Her BMET returned unchanged on HCTZ.   She reports she has been doing well on increased blood pressure medication. She states with generic Crestor she has been having muscle aches. We did get approval of brand Crestor for her but copay was $200/month and she is unable to afford this. She has been taking rosuvastatin.    Current HTN meds:  Hydrochlorothiazide 25mg  daily in am Valsartan 320mg  daily in am  Previously tried: none  BP goal: <130/80  Family History:   Social History: Denies tobacco products and alcohol. Did have second hand tobacco exposure for approximately 40 years.   Diet: Eats combination out and from home. She rarely adds salt. She denies coffee, occasionally has soda or tea.   Exercise: No formal exercise due to leg pain.   Home BP readings:  She reports one measurement 94/47, typically she runs in 110s/70s.   Wt Readings from Last 3 Encounters:  08/01/16 186 lb 6.4 oz (84.6 kg)  08/01/16 185 lb (83.9 kg)  07/04/16 186 lb 3.2 oz (84.5 kg)   BP Readings from Last 3 Encounters:  09/05/16 124/82  08/01/16 (!) 158/98  08/01/16 (!) 142/90   Pulse Readings from Last 3 Encounters:  09/05/16 85  08/01/16 76  08/01/16 88    Renal function: CrCl cannot be calculated (Unknown ideal weight.).  Past Medical History:  Diagnosis Date  . Arthritis   . CAD (coronary artery disease)    a. 02/2008 Cath: EF 60%, RCA 100 w/ L->R collats, LM 20d, RI 60-70, small, LAD 60/71m, 99d, D1 50, OM1 50. Poor distal targets for CABG/no good interventional  target -> medical Rx; b. Myoview in [place] in 3/11 -nl;  c. 07/2011 MV: EF 67%, mild inf ischemia->Med Rx; 02/2015 MV: EF 50%, small, mod intensity apical and apical inf rev defect-->Med Rx.  . Chronic cough    hx; ACEI stopped, possible contribution from GERD  . Depression   . Dyspnea    a. 08/2013 Echo: EF 60-65%, mild AS;  b. PFTs (10/10): FVC 103%, ratio 74%. mild obstruction; c. CT chest (6/11) no evidence for interstitial lung dz. possible asthma/upper airway cough sydrome. aspirin & beta blockers- potential causes for bronchospasm;  . GERD (gastroesophageal reflux disease)   . HBV (hepatitis B virus) infection   . HCV (hepatitis C virus)   . History of PAC's    hx  . History of PVC's    hx  . HLD (hyperlipidemia)    unable to tolerate most statins due to myalgias and elevated CK. thinks pravastatin casued a rash. Only tolerates crestor.  . Hypertensive heart disease   . IBS (irritable bowel syndrome)   . Obesity   . Osteoarthritis    knee; s/p TKR  . Osteoporosis   . Pancreatitis    hx  . PUD (peptic ulcer disease)   . Sleep apnea  diagnosed in 2011 , did not want CPAP -     Current Outpatient Prescriptions on File Prior to Visit  Medication Sig Dispense Refill  . albuterol (PROVENTIL) (2.5 MG/3ML) 0.083% nebulizer solution Take 3 mLs (2.5 mg total) by nebulization every 6 (six) hours as needed for wheezing or shortness of breath. 270 mL 0  . budesonide-formoterol (SYMBICORT) 80-4.5 MCG/ACT inhaler Inhale 2 puffs into the lungs 2 (two) times daily. 1 Inhaler 5  . clopidogrel (PLAVIX) 75 MG tablet TAKE 1 TABLET (75 MG TOTAL) BY MOUTH DAILY. 90 tablet 0  . ergocalciferol (VITAMIN D2) 50000 units capsule Take 50,000 Units by mouth once a week.    . hydrochlorothiazide (HYDRODIURIL) 25 MG tablet Take 1 tablet (25 mg total) by mouth daily. 90 tablet 1  . nitroGLYCERIN (NITROSTAT) 0.4 MG SL tablet Place 0.4 mg under the tongue every 5 (five) minutes as needed for chest pain.     .  pantoprazole (PROTONIX) 20 MG tablet Take 1 tablet (20 mg total) by mouth daily. 30 tablet 0  . rosuvastatin (CRESTOR) 40 MG tablet Take 1 tablet (40 mg total) by mouth daily. 90 tablet 1  . valsartan (DIOVAN) 320 MG tablet Take 1 tablet (320 mg total) by mouth daily. 30 tablet 8  . famotidine (PEPCID) 20 MG tablet Take 20 mg by mouth at bedtime.    . [DISCONTINUED] atorvastatin (LIPITOR) 40 MG tablet Take 1 tablet (40 mg total) by mouth daily. 90 tablet 0   No current facility-administered medications on file prior to visit.     Allergies  Allergen Reactions  . Aspirin Nausea Only  . Clarithromycin Other (See Comments)    unknown  . Doxycycline Nausea Only  . Pravastatin Itching    REACTION: itching  . Levaquin [Levofloxacin In D5w] Rash    Rash, HA  . Penicillins Itching and Rash    Other reaction(s): Unknown Has patient had a PCN reaction causing immediate rash, facial/tongue/throat swelling, SOB or lightheadedness with hypotension: No Has patient had a PCN reaction causing severe rash involving mucus membranes or skin necrosis: No Has patient had a PCN reaction that required hospitalization No Has patient had a PCN reaction occurring within the last 10 years: No If all of the above answers are "NO", then may proceed with Cephalosporin use.     Blood pressure 124/82, pulse 85.   Assessment/Plan: Hypertension: BP at goal on valsartan and HCTZ. Continue both at current dose.    Hyperlipidemia: Will do statin holiday to help with muscle symptoms. After 2 weeks will resume at 20mg  daily of generic rosuvastatin with goal to have her tolerate statin therapy. Discussed adding Zetia and/or PCSK9i therapy if LDL not at goal on lower dose of statin. Pt agreeable to this plan.   Thank you, . [name], PharmD  [facility]  09/05/2016 4:05 PM

## 2016-09-15 ENCOUNTER — Ambulatory Visit (INDEPENDENT_AMBULATORY_CARE_PROVIDER_SITE_OTHER): Payer: PPO | Admitting: Internal Medicine

## 2016-09-15 ENCOUNTER — Encounter: Payer: Self-pay | Admitting: Internal Medicine

## 2016-09-15 VITALS — BP 128/84 | HR 85 | Ht 61.0 in | Wt 184.0 lb

## 2016-09-15 DIAGNOSIS — J45991 Cough variant asthma: Secondary | ICD-10-CM

## 2016-09-15 MED ORDER — BUDESONIDE-FORMOTEROL FUMARATE 80-4.5 MCG/ACT IN AERO: 2.0000 | INHALATION_SPRAY | Freq: Two times a day (BID) | RESPIRATORY_TRACT | 0 refills | Status: DC

## 2016-09-15 MED ORDER — PREDNISONE 10 MG PO TABS: ORAL_TABLET | ORAL | 0 refills | Status: DC

## 2016-09-15 MED ORDER — PANTOPRAZOLE SODIUM 40 MG PO TBEC: 40.0000 mg | DELAYED_RELEASE_TABLET | Freq: Every day | ORAL | 2 refills | Status: DC

## 2016-09-15 NOTE — Progress Notes (Signed)
Subjective:    Patient ID: [name] [name], female    DOB: December 18, 1935,    MRN: [id number]    Brief patient profile:  67 yobf  never smoker with CAD for FU of reactive airway disease - trigger ? GERD & mild AS / with completely nl lung function during flare 09/30/2014   10/29/09 - initial  This was of insidious onset - - She had an ER visit during a trip to [place] - echo nml LV fn, BNP 55, stress myoview nml, given cipro for 'bronchitis.'  CXR 08/10/09 shows bibasal interstitial prominence, lt volume loss with raised hemidiaphragm  CT angio chest 01/2008 was neg for PE, stable RUL 73m nodule as compared to 05/2005, no obvious fibrosis. CT sinuses 8/11 nml  She reports intermittent heartburn with protonix , had an EGD by Dr MCollene Maresin 11/10  PFTs in 03/2009 were essentially nml, no obstruction, nml lung volumes & diffusion.  [facility] Spirometry 6/11 shows nml lung function, RAST neg, Labs showed neg ANA, and low ESR. RA factor was elevated.  ER visit on 01/09/10 - absolute eos count 800 ,BNP 69, CXR - cardiomegaly, was on prednisone .  Sep, 2011 >> Referred to Rheum ([place] - favors OA rather than RA, CCP neg, CK was high 709 - muscle cramps, crestor stopped Stopped advair x 2 weeks - 'ran out', beta blockers stopped     01/10/14 [name] [name] eval  Key is to keep reflux under control Stay on protonix daytime & pepcid at night Keep appt with Dr MCollene MaresPrednisone 10 mg tabs  Take 2 tabs daily with food x 7ds, then 1 tab daily with food x 7ds then STOP  # 40 Stay on symbicort Albuterol MDI 2 puffs upto 4 times daily as needed for wheezing Start singulair 10 mg at bedtime> never started it    03/31/2014 f/u ov/[name] [name] re: ? Asthma  Chief Complaint  Patient presents with  . Follow-up    Pt states that her breathing is doing better since the last visit. She states does not have rescue inhaler, and has neb but has not needed since last visit.   maintaining protonix 40 mg  ac and pepcid at bedtime but easily confused with names of meds and instructions rec Continue symbicort 160 Take 2 puffs first thing in am and then another 2 puffs about 12 hours later.  Work on inhaler technique:    Only use your albuterol as a rescue medication  Stop cozar and take diovan (valsartan) 160 mg one daily          08/01/2016 acute extended ov/[name] [name] re:  Intermittent chest tight x two months/  Maintaining on ppi/ pepcid daily and no maint pulmonary  meds/ has symbicort but using neb instead which seems to help "for a few days"  Chief Complaint  Patient presents with  . Acute Visit    Breathing is progressively worse since her last visit. She states she tires very easily.   no sign cough/ not limited by breathing but but "tired in my chest" and R knee pain  Severe fatigue since ?  One year  Sleeping really well s noct cough/sob  Last neb was last week no increase of symbicort / extremely confused again with meds rec Restart symbicort 80 Take 2 puffs first thing in am and then another 2 puffs about 12 hours later Work on inhaler technique:  relax and gently blow all the way out then  take a nice smooth deep breath back in, triggering the inhaler at same time you start breathing in.       09/15/2016  f/u ov/[name] [name] re: cough variant asthma vs pseudoasthma component/ not on any maint rx x for gerd rx  Chief Complaint  Patient presents with  . Acute Visit    worse sob/ cough  breathing overall worse x one year assoc with fatigue but breathing better with neb but that's all that helps and it only helps for a little while  Not clear why did not follow up after last ov and doesn't recall if symb 80 helped  Breathing better noct on cpap/ worse if bends over  Did not bring all meds as req  No obvious day to day or daytime variability or assoc excess/ purulent sputum or mucus plugs or hemoptysis or cp or chest tightness, subjective wheeze or overt sinus or hb symptoms. No unusual exp  hx or h/o childhood pna/ asthma or knowledge of premature birth.  Sleeping ok without nocturnal  or early am exacerbation  of respiratory  c/o's or need for noct saba. Also denies any obvious fluctuation of symptoms with weather or environmental changes or other aggravating or alleviating factors except as outlined above   Current Medications, Allergies, Complete Past Medical History, Past Surgical History, Family History, and Social History were reviewed in [employer] record.  ROS  The following are not active complaints unless bolded sore throat, dysphagia, dental problems, itching, sneezing,  nasal congestion or excess/ purulent secretions, ear ache,   fever, chills, sweats, unintended wt loss, classically pleuritic or exertional cp,  orthopnea pnd or leg swelling, presyncope, palpitations, abdominal pain, anorexia, nausea, vomiting, diarrhea  or change in bowel or bladder habits, change in stools or urine, dysuria,hematuria,  rash, arthralgias, visual complaints, headache, numbness, weakness or ataxia or problems with walking or coordination,  change in mood/affect or memory.                    Objective:  Physical Exam  amb bf nad  / extremely difficult hx/ very confused with details of care   04/28/2014      183 >  06/09/13 178 > 09/30/2014  181> 10/16/2014   182 > 02/23/2015  188 > 03/09/2015   192 > 08/01/2016   185 > 09/15/2016  184     03/31/14 187 lb (84.823 kg)  02/17/14 187 lb (84.823 kg)  01/10/14 188 lb 6.4 oz (85.458 kg)     Vital signs reviewed - Note on arrival 02 sats  96% on RA       HEENT: full upper, lower partial dentures, turbinates, and orophanx. Nl external ear canals without cough reflex   NECK :  without JVD/Nodes/TM/ nl carotid upstrokes bilaterally   LUNGS: no acc muscle use,  mid exp wheeze/ cough    CV:  RRR  no s3,  II/VI SEM  no increase in P2, no edema   ABD:  soft and nontender with nl excursion in the supine position. No  bruits or organomegaly, bowel sounds nl  MS:  warm without deformities, calf tenderness, cyanosis or clubbing          Labs   reviewed:      Chemistry      Component Value Date/Time   NA 144 08/29/2016 0946   K 3.9 08/29/2016 0946   CL 103 08/29/2016 0946   CO2 25 08/29/2016 0946   BUN 18 08/29/2016 0946  CREATININE 0.90 08/29/2016 0946   CREATININE 0.86 01/28/2016 0841      Component Value Date/Time   CALCIUM 9.0 08/29/2016 0946   ALKPHOS 71 08/01/2016 1632   AST 13 08/01/2016 1632   ALT 8 08/01/2016 1632   BILITOT <0.2 08/01/2016 1632        Lab Results  Component Value Date   WBC 6.6 08/01/2016   HGB 14.6 10/10/2015   HCT 39.2 08/01/2016   MCV 89 08/01/2016   PLT 153 08/01/2016       Lab Results  Component Value Date   TSH 2.200 08/01/2016               Assessment & Plan:

## 2016-09-15 NOTE — Patient Instructions (Addendum)
Prednisone 10 mg take  4 each am x 2 days,   2 each am x 2 days,  1 each am x 2 days and stop   Plan A = Automatic = Symbicort 80 Take 2 puffs first thing in am and then another 2 puffs about 12 hours later.    Work on inhaler technique:  relax and gently blow all the way out then take a nice smooth deep breath back in, triggering the inhaler at same time you start breathing in.  Hold for up to 5 seconds if you can. Blow out thru nose. Rinse and gargle with water when done      Plan B = Backup Only use your albuterol nebulizer up to every 4 hours    Please schedule a follow up office visit in 2  weeks, sooner if needed  with all medications /inhalers/ solutions in hand so we can verify exactly what you are taking. This includes all medications from all doctors and over the counters

## 2016-09-16 NOTE — Assessment & Plan Note (Signed)
PFTs in 03/2009 were essentially nml, no obstruction, nml lung volumes & diffusion - improved symptoms p stopped losartan 04/01/14 - 04/28/2014 p extensive coaching HFA effectiveness =    90%  - symptoms improved off all rx 10/16/14 so rec prn saba only  - symptoms flared over the summer of 2016 so 03/09/2015  >> rec restart dulera 100 2 every 12h  - flared of symbicort 08/01/2016  With FEN0 = 48 and spirometry uninterpretable >  try 80 2bid> not clear she took it/ whether or not responded       - 09/15/2016  Active wheeze >> After extensive coaching HFA effectiveness =    75% > rechallenge with symb  The response to pred and saba neb along with moderate elevation of FENO supports dx of asthma though the fact that she gets better on cpap and sleeps fine is against it so I suspect she has elements of both asthma and Upper airway cough syndrome (previously labeled PNDS) , is  so named because it's frequently impossible to sort out how much is  CR/sinusitis with freq throat clearing (which can be related to primary GERD)   vs  causing  secondary (" extra esophageal")  GERD from wide swings in gastric pressure that occur with throat clearing, often  promoting self use of mint and menthol lozenges that reduce the lower esophageal sphincter tone and exacerbate the problem further in a cyclical fashion.   These are the same pts (now being labeled as having "irritable larynx syndrome" by some cough centers) who not infrequently have a history of having failed to tolerate ace inhibitors,  dry powder inhalers or biphosphonates or report having atypical/extraesophageal reflux symptoms that don't respond to standard doses of PPI  and are easily confused as having aecopd or asthma flares by even experienced allergists/ pulmonologists (myself included).   Rec rechallenge with low dose ics only and return for a trust but verify ov before going further ie return with all meds in hand using a trust but verify approach to  confirm accurate Medication  Reconciliation The principal here is that until we are certain that the  patients are doing what we've asked, it makes no sense to ask them to do more.   I had an extended discussion with the patient reviewing all relevant studies completed to date and  lasting 25 minutes of a 40  minute acute visit addressing severe  non-specific but potentially very serious refractory respiratory symptoms of unknown etiology.  Each maintenance medication was reviewed in detail including most importantly the difference between maintenance and prns and under what circumstances the prns are to be triggered using an action plan format that is not reflected in the computer generated alphabetically organized AVS.    Please see AVS for specific instructions unique to this office visit that I personally wrote and verbalized to the the pt in detail and then reviewed with pt  by my nurse highlighting any changes in therapy/plan of care  recommended at today's visit.

## 2016-09-16 NOTE — Assessment & Plan Note (Signed)
Body mass index is 34.77 kg/m.  -  trending no change  Lab Results  Component Value Date   TSH 2.200 08/01/2016     Contributing to gerd risk/ doe/reviewed the need and the process to achieve and maintain neg calorie balance > defer f/u primary care including intermittently monitoring thyroid status

## 2016-09-19 DIAGNOSIS — M25561 Pain in right knee: Secondary | ICD-10-CM | POA: Diagnosis not present

## 2016-09-19 DIAGNOSIS — M25512 Pain in left shoulder: Secondary | ICD-10-CM | POA: Diagnosis not present

## 2016-09-19 DIAGNOSIS — M545 Low back pain: Secondary | ICD-10-CM | POA: Diagnosis not present

## 2016-09-19 DIAGNOSIS — M25511 Pain in right shoulder: Secondary | ICD-10-CM | POA: Diagnosis not present

## 2016-09-19 DIAGNOSIS — M48061 Spinal stenosis, lumbar region without neurogenic claudication: Secondary | ICD-10-CM | POA: Diagnosis not present

## 2016-09-30 ENCOUNTER — Telehealth: Payer: Self-pay | Admitting: Internal Medicine

## 2016-09-30 ENCOUNTER — Encounter: Payer: PPO | Admitting: Adult Health

## 2016-09-30 MED ORDER — BUDESONIDE-FORMOTEROL FUMARATE 80-4.5 MCG/ACT IN AERO: 2.0000 | INHALATION_SPRAY | Freq: Two times a day (BID) | RESPIRATORY_TRACT | 0 refills | Status: DC

## 2016-09-30 NOTE — Telephone Encounter (Signed)
symbicort samples given to patient.  Will close encounter.

## 2016-10-10 ENCOUNTER — Other Ambulatory Visit: Payer: Self-pay | Admitting: Internal Medicine

## 2016-10-10 DIAGNOSIS — Z1231 Encounter for screening mammogram for malignant neoplasm of breast: Secondary | ICD-10-CM

## 2016-10-14 ENCOUNTER — Ambulatory Visit (INDEPENDENT_AMBULATORY_CARE_PROVIDER_SITE_OTHER): Payer: PPO | Admitting: Adult Health

## 2016-10-14 ENCOUNTER — Encounter: Payer: Self-pay | Admitting: Adult Health

## 2016-10-14 DIAGNOSIS — J45991 Cough variant asthma: Secondary | ICD-10-CM | POA: Diagnosis not present

## 2016-10-14 MED ORDER — NITROGLYCERIN 0.4 MG SL SUBL: 0.4000 mg | SUBLINGUAL_TABLET | SUBLINGUAL | 0 refills | Status: DC | PRN

## 2016-10-14 MED ORDER — ALBUTEROL SULFATE HFA 108 (90 BASE) MCG/ACT IN AERS: 2.0000 | INHALATION_SPRAY | RESPIRATORY_TRACT | 1 refills | Status: DC | PRN

## 2016-10-14 NOTE — Progress Notes (Signed)
_0  ID: [name] [name], female    DOB: Oct 19, 1935, 81 y.o.   MRN: [id number]  Chief Complaint  Patient presents with  . Follow-up    Asthma     Referring provider: No ref. provider found  HPI: 81 yo AAF never smoker followed for RAD/Cough variant asthma -? GERD /AR triggers.  CAD   TEST  CXR 08/10/09 shows bibasal interstitial prominence, lt volume loss with raised hemidiaphragm  CT angio chest 01/2008 was neg for PE, stable RUL 88m nodule as compared to 05/2005, no obvious fibrosis. CT sinuses 8/11 nml  She reports intermittent heartburn with protonix , had an EGD by Dr MCollene Maresin 11/10  PFTs in 03/2009 were essentially nml, no obstruction, nml lung volumes & diffusion.  Spirometry 6/11 shows nml lung function, RAST neg, Labs showed neg ANA, and low ESR. RA factor was elevated.  ER visit on 01/09/10 - absolute eos count 800 ,BNP 69, CXR - cardiomegaly, was on prednisone .  Sep, 2011 >>Referred to Rheum ([facility] - favors OA rather than RA, CCP neg, CK was high 709 - muscle cramps, crestor stopped Stopped advair x 2 weeks - 'ran out', beta blockers stopped   10/14/2016 Follow up : Asthma  Patient presents for a one-month follow-up. Patient was seen last visit with an asthma exacerbation. She was treated with a prednisone taper. Patient returns today feeling  Much better. Wheezing and cough have resolved. She denies any hemoptysis, chest pain, orthopnea, PND or leg swelling.  We reviewed all her medications organize them into a medication calendar with patient education. He is taking her medications correctly. Patient says that she needs a refill her nitroglycerin. He has expired. She does not have a appointment with cardiology for couple weeks and requests that we refill this. Complain that she will need to keep her follow with cardiology. He denies any chest pain..   Allergies  Allergen Reactions  . Aspirin Nausea Only  . Clarithromycin Other (See Comments)    unknown  .  Doxycycline Nausea Only  . Pravastatin Itching    REACTION: itching  . Levaquin [Levofloxacin In D5w] Rash    Rash, HA  . Penicillins Itching and Rash    Other reaction(s): Unknown Has patient had a PCN reaction causing immediate rash, facial/tongue/throat swelling, SOB or lightheadedness with hypotension: No Has patient had a PCN reaction causing severe rash involving mucus membranes or skin necrosis: No Has patient had a PCN reaction that required hospitalization No Has patient had a PCN reaction occurring within the last 10 years: No If all of the above answers are "NO", then may proceed with Cephalosporin use.     Immunization History  Administered Date(s) Administered  . Td 06/06/1993    Past Medical History:  Diagnosis Date  . Arthritis   . CAD (coronary artery disease)    a. 02/2008 Cath: EF 60%, RCA 100 w/ L->R collats, LM 20d, RI 60-70, small, LAD 60/64m99d, D1 50, OM1 50. Poor distal targets for CABG/no good interventional target -> medical Rx; b. Myoview in [place] in 3/11 -nl;  c. 07/2011 MV: EF 67%, mild inf ischemia->Med Rx; 02/2015 MV: EF 50%, small, mod intensity apical and apical inf rev defect-->Med Rx.  . Chronic cough    hx; ACEI stopped, possible contribution from GERD  . Depression   . Dyspnea    a. 08/2013 Echo: EF 60-65%, mild AS;  b. PFTs (10/10): FVC 103%, ratio 74%. mild obstruction; c. CT chest (6/11) no evidence  for interstitial lung dz. possible asthma/upper airway cough sydrome. aspirin & beta blockers- potential causes for bronchospasm;  . GERD (gastroesophageal reflux disease)   . HBV (hepatitis B virus) infection   . HCV (hepatitis C virus)   . History of PAC's    hx  . History of PVC's    hx  . HLD (hyperlipidemia)    unable to tolerate most statins due to myalgias and elevated CK. thinks pravastatin casued a rash. Only tolerates crestor.  . Hypertensive heart disease   . IBS (irritable bowel syndrome)   . Obesity   . Osteoarthritis    knee; s/p  TKR  . Osteoporosis   . Pancreatitis    hx  . PUD (peptic ulcer disease)   . Sleep apnea    diagnosed in 2011 , did not want CPAP -     Tobacco History: History  Smoking Status  . Never Smoker  Smokeless Tobacco  . Never Used    Comment: 2nd hand exposure x40 years    Counseling given: Not Answered   Outpatient Encounter Prescriptions as of 10/14/2016  Medication Sig  . albuterol (PROVENTIL) (2.5 MG/3ML) 0.083% nebulizer solution Take 3 mLs (2.5 mg total) by nebulization every 6 (six) hours as needed for wheezing or shortness of breath.  . budesonide-formoterol (SYMBICORT) 80-4.5 MCG/ACT inhaler Inhale 2 puffs into the lungs 2 (two) times daily.  . budesonide-formoterol (SYMBICORT) 80-4.5 MCG/ACT inhaler Inhale 2 puffs into the lungs 2 (two) times daily.  . budesonide-formoterol (SYMBICORT) 80-4.5 MCG/ACT inhaler Inhale 2 puffs into the lungs 2 (two) times daily.  . clopidogrel (PLAVIX) 75 MG tablet TAKE 1 TABLET (75 MG TOTAL) BY MOUTH DAILY.  . ergocalciferol (VITAMIN D2) 50000 units capsule Take 50,000 Units by mouth once a week.  . famotidine (PEPCID) 20 MG tablet Take 20 mg by mouth at bedtime.  . hydrochlorothiazide (HYDRODIURIL) 25 MG tablet Take 1 tablet (25 mg total) by mouth daily.  . nitroGLYCERIN (NITROSTAT) 0.4 MG SL tablet Place 0.4 mg under the tongue every 5 (five) minutes as needed for chest pain.   . pantoprazole (PROTONIX) 40 MG tablet Take 1 tablet (40 mg total) by mouth daily. Take 30-60 min before first meal of the day  . predniSONE (DELTASONE) 10 MG tablet Take  4 each am x 2 days,   2 each am x 2 days,  1 each am x 2 days and stop  . rosuvastatin (CRESTOR) 40 MG tablet Take 1 tablet (40 mg total) by mouth daily.  . valsartan (DIOVAN) 320 MG tablet Take 1 tablet (320 mg total) by mouth daily.  Marland Kitchen albuterol (PROAIR HFA) 108 (90 Base) MCG/ACT inhaler Inhale 2 puffs into the lungs every 4 (four) hours as needed for wheezing or shortness of breath.   No  facility-administered encounter medications on file as of 10/14/2016.      Review of Systems  Constitutional:   No  weight loss, night sweats,  Fevers, chills, fatigue, or  lassitude.  HEENT:   No headaches,  Difficulty swallowing,  Tooth/dental problems, or  Sore throat,                No sneezing, itching, ear ache,  +nasal congestion, post nasal drip,   CV:  No chest pain,  Orthopnea, PND, swelling in lower extremities, anasarca, dizziness, palpitations, syncope.   GI  No heartburn, indigestion, abdominal pain, nausea, vomiting, diarrhea, change in bowel habits, loss of appetite, bloody stools.   Resp:    No  chest wall deformity  Skin: no rash or lesions.  GU: no dysuria, change in color of urine, no urgency or frequency.  No flank pain, no hematuria   MS:  No joint pain or swelling.  No decreased range of motion.  No back pain.    Physical Exam  BP 124/74 (BP Location: Left Arm, Cuff Size: Normal)   Pulse 76   Ht _0  (1.549 m)   Wt 180 lb 9.6 oz (81.9 kg)   SpO2 99%   BMI 34.12 kg/m   GEN: A/Ox3; pleasant , NAD, elderly    HEENT:  Kirkpatrick/AT,  EACs-clear, TMs-wnl, NOSE-clear, THROAT-clear, no lesions, no postnasal drip or exudate noted.   NECK:  Supple w/ fair ROM; no JVD; normal carotid impulses w/o bruits; no thyromegaly or nodules palpated; no lymphadenopathy.    RESP  Clear  P & A; w/o, wheezes/ rales/ or rhonchi. no accessory muscle use, no dullness to percussion  CARD:  RRR, no m/r/g, no peripheral edema, pulses intact, no cyanosis or clubbing.  GI:   Soft & nt; nml bowel sounds; no organomegaly or masses detected.   Musco: Warm bil, no deformities or joint swelling noted.   Neuro: alert, no focal deficits noted.    Skin: Warm, no lesions or rashes    Lab Results:   BNP No results found for: BNP   Imaging: No results found.   Assessment & Plan:   Cough variant asthma vs UACS with VCD  Recent exacerbation, now resolved Patient's medications  were reviewed today and patient education was given. Computerized medication calendar was adjusted/completed   Plan  Patient Instructions  Continue on current regimen .  Will refill Nitro today , need to get additional refills from Cardiology and keep follow up with cardiology .  Follow med calendar closely and bring to each visit .  Follow up Dr. Melvyn Novas  In 3 months and As needed         Rexene Edison, NP 10/14/2016

## 2016-10-14 NOTE — Assessment & Plan Note (Signed)
Recent exacerbation, now resolved Patient's medications were reviewed today and patient education was given. Computerized medication calendar was adjusted/completed   Plan  Patient Instructions  Continue on current regimen .  Will refill Nitro today , need to get additional refills from Cardiology and keep follow up with cardiology .  Follow med calendar closely and bring to each visit .  Follow up Dr. Sherene Sires  In 3 months and As needed

## 2016-10-14 NOTE — Progress Notes (Signed)
Chart and office note reviewed in detail  > agree with a/p as outlined    

## 2016-10-14 NOTE — Patient Instructions (Addendum)
Continue on current regimen .  Will refill Nitro today , need to get additional refills from Cardiology and keep follow up with cardiology .  Follow med calendar closely and bring to each visit .  Follow up Dr. Sherene Sires  In 3 months and As needed

## 2016-10-14 NOTE — Addendum Note (Signed)
Addended by: Boone Master E on: 10/14/2016 05:17 PM   Modules accepted: Orders

## 2016-10-28 ENCOUNTER — Ambulatory Visit
Admission: RE | Admit: 2016-10-28 | Discharge: 2016-10-28 | Disposition: A | Discharge status: Home / Self Care | Payer: PPO | Source: Ambulatory Visit | Attending: Internal Medicine | Admitting: Internal Medicine

## 2016-10-28 DIAGNOSIS — Z1231 Encounter for screening mammogram for malignant neoplasm of breast: Secondary | ICD-10-CM

## 2016-11-02 DIAGNOSIS — M48061 Spinal stenosis, lumbar region without neurogenic claudication: Secondary | ICD-10-CM | POA: Diagnosis not present

## 2016-11-02 DIAGNOSIS — M722 Plantar fascial fibromatosis: Secondary | ICD-10-CM | POA: Diagnosis not present

## 2016-11-10 ENCOUNTER — Ambulatory Visit (INDEPENDENT_AMBULATORY_CARE_PROVIDER_SITE_OTHER): Payer: PPO | Admitting: Internal Medicine

## 2016-11-10 ENCOUNTER — Encounter: Payer: Self-pay | Admitting: Internal Medicine

## 2016-11-10 VITALS — BP 140/80 | HR 76 | Ht 61.0 in | Wt 183.0 lb

## 2016-11-10 DIAGNOSIS — I25118 Atherosclerotic heart disease of native coronary artery with other forms of angina pectoris: Secondary | ICD-10-CM

## 2016-11-10 DIAGNOSIS — I10 Essential (primary) hypertension: Secondary | ICD-10-CM | POA: Diagnosis not present

## 2016-11-10 DIAGNOSIS — E785 Hyperlipidemia, unspecified: Secondary | ICD-10-CM | POA: Diagnosis not present

## 2016-11-10 MED ORDER — ROSUVASTATIN CALCIUM 20 MG PO TABS: 20.0000 mg | ORAL_TABLET | Freq: Every day | ORAL | 1 refills | Status: DC

## 2016-11-10 MED ORDER — ISOSORBIDE MONONITRATE ER 30 MG PO TB24: 30.0000 mg | ORAL_TABLET | Freq: Every day | ORAL | 1 refills | Status: DC

## 2016-11-10 NOTE — Progress Notes (Signed)
Follow-up Outpatient Visit Date: 11/10/2016  Primary Care Provider: Jarome Matin (Inactive) [place]  Chief Complaint: Follow-up coronary artery disease  HPI:  [name] [name] is a 81 y.o. year-old female with history of multivessel CAD being managed medically due to poor PCI and CABG targets, hypertension, hyperlipidemia, PACs and PVCs, obstructive sleep apnea not on CPAP, cough variant asthma, hepatitis B and C, and GERD, who presents for follow-up of coronary artery disease. I last saw her in 08/01/16, which time she was doing well. Today, she notes doing relatively well, though she has had rare episodes of mild substernal chest burning without radiation when walking briskly. She stops to rest for 5 minutes, the pain abates and she is able to continue without symptoms. There are no associated symptoms, including dyspnea, lightheadedness, palpitations, and diaphoresis. She has not taken any sublingual nitroglycerin. She is concerned about myalgias with generic rosuvastatin. She has stopped this medication altogether due to muscle pains. Prescription for name brand Crestor was sent to the pharmacy, but she is unable to afford the co-pay. She has been intolerant of other statins in the past due to muscle aches. She denies edema, orthopnea, PND, and claudication. She otherwise is compliant with her medications. Since her last visit, she has been using her inhalers as directed with improved shortness of breath..  --------------------------------------------------------------------------------------------------  Cardiovascular History & Procedures: Cardiovascular Problems:  Multivessel coronary artery disease  PACs and PVCs  Risk Factors:  Known coronary artery disease, hypertension, and age > 2  Cath/PCI:  LHC (02/20/08): LMCA with 20% distal stenosis. LAD with serial 70% and 60% lesions in the midportion. Distal LAD is subtotally occluded. D1 with 50% stenosis. Ramus is small vessel with  60-70% mid stenosis. LCx with luminal irregularities. OM1 has 50% stenosis. Dominant RCA with proximal CTO. LVEF 60% with mid inferior hypokinesis. LVEDP 16 mmHg.  CV Surgery:  None  EP Procedures and Devices:  None  Non-Invasive Evaluation(s):  Pharmacologic myocardial perfusion stress test (02/19/15): Intermediate risk study with small in size, moderate in intensity reversible apical and apical inferior defect. Inferolateral wall is hypokinetic with LVEF 50%.  Recent CV Pertinent Labs: Lab Results  Component Value Date   CHOL 187 08/01/2016   HDL 40 08/01/2016   LDLCALC 103 (H) 08/01/2016   TRIG 220 (H) 08/01/2016   CHOLHDL 4.7 (H) 08/01/2016   CHOLHDL 2.0 06/16/2015   INR 0.93 07/04/2010   K 3.9 08/29/2016   BUN 18 08/29/2016   CREATININE 0.90 08/29/2016   CREATININE 0.86 01/28/2016    Past medical and surgical history were reviewed and updated in EPIC.  Outpatient Encounter Prescriptions as of 11/10/2016  Medication Sig  . acetaminophen (TYLENOL) 325 MG tablet Per bottle as needed  . albuterol (PROAIR HFA) 108 (90 Base) MCG/ACT inhaler Inhale 2 puffs into the lungs every 4 (four) hours as needed for wheezing or shortness of breath.  Marland Kitchen albuterol (PROVENTIL) (2.5 MG/3ML) 0.083% nebulizer solution Take 3 mLs (2.5 mg total) by nebulization every 6 (six) hours as needed for wheezing or shortness of breath.  . budesonide-formoterol (SYMBICORT) 80-4.5 MCG/ACT inhaler Inhale 2 puffs into the lungs 2 (two) times daily.  . clopidogrel (PLAVIX) 75 MG tablet TAKE 1 TABLET (75 MG TOTAL) BY MOUTH DAILY.  . ergocalciferol (VITAMIN D2) 50000 units capsule Take 50,000 Units by mouth once a week.  . famotidine (PEPCID) 20 MG tablet Take 20 mg by mouth at bedtime.  . hydrochlorothiazide (HYDRODIURIL) 25 MG tablet Take 1 tablet (25 mg total) by  mouth daily.  . nitroGLYCERIN (NITROSTAT) 0.4 MG SL tablet Place 1 tablet (0.4 mg total) under the tongue every 5 (five) minutes as needed for chest  pain.  . pantoprazole (PROTONIX) 40 MG tablet Take 40 mg by mouth daily.  . rosuvastatin (CRESTOR) 40 MG tablet Take 1 tablet by mouth daily.  . valsartan (DIOVAN) 320 MG tablet Take 1 tablet (320 mg total) by mouth daily.   No facility-administered encounter medications on file as of 11/10/2016.     Allergies: Aspirin; Clarithromycin; Doxycycline; Pravastatin; Levaquin [levofloxacin in d5w]; and Penicillins  Social History   Social History  . Marital status: Married    Spouse name: Lollie Sails  . Number of children: 4  . Years of education: 12   Occupational History  . retired  Retired    [employer]   Social History Main Topics  . Smoking status: Never Smoker  . Smokeless tobacco: Never Used     Comment: 2nd hand exposure x40 years   . Alcohol use No  . Drug use: No  . Sexual activity: Not Currently   Other Topics Concern  . Not on file   Social History Narrative   Married, lives with husband; has children.   Retired Government social research officer.       Cell # [phone]; okay to leave a message.           Family History  Problem Relation Age of Onset  . Asthma Mother   . Arthritis Mother        rheumatiod arthritis  . Stroke Mother   . Asthma Sister   . Stroke Sister   . COPD Sister   . Emphysema Sister   . Colon cancer Maternal Aunt   . Lung cancer Maternal Aunt   . Pancreatic cancer Brother     Review of Systems: A 12-system review of systems was performed and was negative except as noted in the HPI.  --------------------------------------------------------------------------------------------------  Physical Exam: BP 140/80   Pulse 76   Ht 5\' 1"  (1.549 m)   Wt 183 lb (83 kg)   SpO2 98%   BMI 34.58 kg/m   General:  Obese woman, seated comfortably in the exam room. HEENT: No conjunctival pallor or scleral icterus.  Moist mucous membranes.  OP clear. Neck: Supple without lymphadenopathy, thyromegaly, JVD, or HJR.  No carotid bruit. Lungs: Normal  work of breathing.  Clear to auscultation bilaterally without wheezes or crackles. Heart: Regular rate and rhythm without murmurs, rubs, or gallops.  Non-displaced PMI. Abd: Bowel sounds present.  Soft, NT/ND without hepatosplenomegaly Ext: No lower extremity edema.  Radial, PT, and DP pulses are 2+ bilaterally. Skin: warm and dry without rash  Lab Results  Component Value Date   WBC 6.6 08/01/2016   HGB 13.2 08/01/2016   HCT 39.2 08/01/2016   MCV 89 08/01/2016   PLT 153 08/01/2016    Lab Results  Component Value Date   NA 144 08/29/2016   K 3.9 08/29/2016   CL 103 08/29/2016   CO2 25 08/29/2016   BUN 18 08/29/2016   CREATININE 0.90 08/29/2016   GLUCOSE 109 (H) 08/29/2016   ALT 8 08/01/2016    Lab Results  Component Value Date   CHOL 187 08/01/2016   HDL 40 08/01/2016   LDLCALC 103 (H) 08/01/2016   TRIG 220 (H) 08/01/2016   CHOLHDL 4.7 (H) 08/01/2016   --------------------------------------------------------------------------------------------------  ASSESSMENT AND PLAN: Coronary artery disease with stable angina Overall, Ms. Ringen feels well, though she  has experienced a few episodes of mild substernal chest burning with brisk walking. Frequency and intensity is unchanged, consistent with stable angina. It appears that one point, she was prescribed isosorbide mononitrate, though she does not recall this. It is not currently on her medication list. We have agreed to start isosorbide mononitrate 30 mg daily. She will continue the remainder of her medications.  Hypertension Blood pressure is mildly elevated today. As above, we will add isosorbide mononitrate 30 mg daily.  Hyperlipidemia Patient has been intolerant of generic rosuvastatin 40 mg daily. Unfortunately, she is unable to afford Crestor, which she tolerated well in the past. We have agreed to decrease rosuvastatin 20 mg daily and recheck a lipid panel in about 2 months. If she continues to have significant  myalgias or is unable to achieve an LDL less than 70, we will refer Ms. Knipfer to the lipid clinic for consideration of a PCSK9 inhibitor.  Follow-up: Return to clinic in 3 months.  [name], MD 11/10/2016 8:57 PM

## 2016-11-10 NOTE — Patient Instructions (Signed)
Medication Instructions:  Decrease crestor (rosuvastatin) to 20 mg daily. You can 1/2 of your 40 mg tablet daily and use your current supply.  Start Imdur (isosorbide mononitrate ) 30 mg daily  Labwork: Your physician recommends that you return for a FASTING lipid profile in 2 months.    Testing/Procedures: None   Follow-Up: Your physician recommends that you schedule a follow-up appointment in: 3 months with Dr End.         If you need a refill on your cardiac medications before your next appointment, please call your pharmacy.

## 2016-11-11 DIAGNOSIS — E784 Other hyperlipidemia: Secondary | ICD-10-CM | POA: Diagnosis not present

## 2016-11-11 DIAGNOSIS — I10 Essential (primary) hypertension: Secondary | ICD-10-CM | POA: Diagnosis not present

## 2016-11-11 DIAGNOSIS — E559 Vitamin D deficiency, unspecified: Secondary | ICD-10-CM | POA: Diagnosis not present

## 2016-12-02 DIAGNOSIS — B1920 Unspecified viral hepatitis C without hepatic coma: Secondary | ICD-10-CM | POA: Diagnosis not present

## 2016-12-02 DIAGNOSIS — Z0000 Encounter for general adult medical examination without abnormal findings: Secondary | ICD-10-CM | POA: Diagnosis not present

## 2016-12-02 DIAGNOSIS — E784 Other hyperlipidemia: Secondary | ICD-10-CM | POA: Diagnosis not present

## 2016-12-02 DIAGNOSIS — M859 Disorder of bone density and structure, unspecified: Secondary | ICD-10-CM | POA: Diagnosis not present

## 2016-12-02 DIAGNOSIS — G4733 Obstructive sleep apnea (adult) (pediatric): Secondary | ICD-10-CM | POA: Diagnosis not present

## 2016-12-02 DIAGNOSIS — Z6834 Body mass index (BMI) 34.0-34.9, adult: Secondary | ICD-10-CM | POA: Diagnosis not present

## 2016-12-02 DIAGNOSIS — I2510 Atherosclerotic heart disease of native coronary artery without angina pectoris: Secondary | ICD-10-CM | POA: Diagnosis not present

## 2016-12-02 DIAGNOSIS — Z1389 Encounter for screening for other disorder: Secondary | ICD-10-CM | POA: Diagnosis not present

## 2016-12-02 DIAGNOSIS — I10 Essential (primary) hypertension: Secondary | ICD-10-CM | POA: Diagnosis not present

## 2016-12-02 DIAGNOSIS — R252 Cramp and spasm: Secondary | ICD-10-CM | POA: Diagnosis not present

## 2016-12-02 DIAGNOSIS — J45998 Other asthma: Secondary | ICD-10-CM | POA: Diagnosis not present

## 2016-12-02 DIAGNOSIS — E559 Vitamin D deficiency, unspecified: Secondary | ICD-10-CM | POA: Diagnosis not present

## 2016-12-09 ENCOUNTER — Other Ambulatory Visit: Payer: Self-pay | Admitting: Cardiology

## 2016-12-12 NOTE — Telephone Encounter (Signed)
Followed by Dr End 

## 2016-12-14 DIAGNOSIS — M542 Cervicalgia: Secondary | ICD-10-CM | POA: Diagnosis not present

## 2017-01-09 ENCOUNTER — Telehealth: Payer: Self-pay | Admitting: *Deleted

## 2017-01-09 MED ORDER — IRBESARTAN 300 MG PO TABS: 300.0000 mg | ORAL_TABLET | Freq: Every day | ORAL | 1 refills | Status: DC

## 2017-01-09 NOTE — Telephone Encounter (Signed)
Valsartan recall-replaced with Irbesartan 300 mg daily-pt aware. Pt has appt with Dr Sherene Sires 01/16/17 and will check BP at that time.

## 2017-01-10 ENCOUNTER — Other Ambulatory Visit: Payer: PPO | Admitting: *Deleted

## 2017-01-10 DIAGNOSIS — I25118 Atherosclerotic heart disease of native coronary artery with other forms of angina pectoris: Secondary | ICD-10-CM

## 2017-01-10 DIAGNOSIS — E785 Hyperlipidemia, unspecified: Secondary | ICD-10-CM

## 2017-01-10 LAB — LIPID PANEL
Chol/HDL Ratio: 2.4 ratio (ref 0.0–4.4)
Cholesterol, Total: 142 mg/dL (ref 100–199)
HDL: 58 mg/dL (ref >39)
LDL Calculated: 70 mg/dL (ref 0–99)
Triglycerides: 69 mg/dL (ref 0–149)
VLDL Cholesterol Cal: 14 mg/dL (ref 5–40)

## 2017-01-11 DIAGNOSIS — M542 Cervicalgia: Secondary | ICD-10-CM | POA: Diagnosis not present

## 2017-01-16 ENCOUNTER — Ambulatory Visit (INDEPENDENT_AMBULATORY_CARE_PROVIDER_SITE_OTHER): Payer: PPO | Admitting: Internal Medicine

## 2017-01-16 ENCOUNTER — Telehealth: Payer: Self-pay | Admitting: *Deleted

## 2017-01-16 ENCOUNTER — Encounter: Payer: Self-pay | Admitting: Internal Medicine

## 2017-01-16 DIAGNOSIS — J45991 Cough variant asthma: Secondary | ICD-10-CM

## 2017-01-16 MED ORDER — BUDESONIDE-FORMOTEROL FUMARATE 80-4.5 MCG/ACT IN AERO: 2.0000 | INHALATION_SPRAY | Freq: Two times a day (BID) | RESPIRATORY_TRACT | 11 refills | Status: DC

## 2017-01-16 MED ORDER — BUDESONIDE-FORMOTEROL FUMARATE 80-4.5 MCG/ACT IN AERO
2.0000 | INHALATION_SPRAY | Freq: Two times a day (BID) | RESPIRATORY_TRACT | 0 refills | Status: DC
Start: 2017-01-16 — End: 2017-01-16

## 2017-01-16 NOTE — Telephone Encounter (Signed)
Pt states she was unable to tolerate isosorbide mononitrate 30 mg daily, it made her feel funny. Pt states she has been taking 1/2 of a 30 mg tablet, is tolerating this dose, denies any chest pain. Pt advised to continue 1/2 of a 30 mg  tablet and I will forward to Dr End.  I will forward to Dr End for review.

## 2017-01-16 NOTE — Telephone Encounter (Signed)
I agree with continuing isosorbide mononitrate 15 mg daily. Thanks for the update.  [name], MD [facility] Pager: [phone]

## 2017-01-16 NOTE — Progress Notes (Signed)
Subjective:    Patient ID: [name] [name], female    DOB: 26-Feb-1936,    MRN: [id number]    Brief patient profile:  41 yobf  never smoker with CAD for FU of reactive airway disease - trigger ? GERD & mild AS / with completely nl lung function during flare 09/30/2014   10/29/09 - initial  This was of insidious onset - - She had an ER visit during a trip to [place] - echo nml LV fn, BNP 55, stress myoview nml, given cipro for 'bronchitis.'  CXR 08/10/09 shows bibasal interstitial prominence, lt volume loss with raised hemidiaphragm  CT angio chest 01/2008 was neg for PE, stable RUL 53m nodule as compared to 05/2005, no obvious fibrosis. CT sinuses 8/11 nml  She reports intermittent heartburn with protonix , had an EGD by Dr MCollene Maresin 11/10  PFTs in 03/2009 were essentially nml, no obstruction, nml lung volumes & diffusion.  [facility]. Spirometry 6/11 shows nml lung function, RAST neg, Labs showed neg ANA, and low ESR. RA factor was elevated.  ER visit on 01/09/10 - absolute eos count 800 ,BNP 69, CXR - cardiomegaly, was on prednisone .  Sep, 2011 >> Referred to Rheum (Ouida Sills - favors OA rather than RA, CCP neg, CK was high 709 - muscle cramps, crestor stopped Stopped advair x 2 weeks - 'ran out', beta blockers stopped     01/10/14 [name] eval  Key is to keep reflux under control Stay on protonix daytime & pepcid at night Keep appt with Dr MCollene MaresPrednisone 10 mg tabs  Take 2 tabs daily with food x 7ds, then 1 tab daily with food x 7ds then STOP  # 40 Stay on symbicort Albuterol MDI 2 puffs upto 4 times daily as needed for wheezing Start singulair 10 mg at bedtime> never started it    03/31/2014 f/u ov/[name] re: ? Asthma  Chief Complaint  Patient presents with  . Follow-up    Pt states that her breathing is doing better since the last visit. She states does not have rescue inhaler, and has neb but has not needed since last visit.   maintaining protonix 40 mg  ac and pepcid at bedtime but easily confused with names of meds and instructions rec Continue symbicort 160 Take 2 puffs first thing in am and then another 2 puffs about 12 hours later.  Work on inhaler technique:    Only use your albuterol as a rescue medication  Stop cozar and take diovan (valsartan) 160 mg one daily          08/01/2016 acute extended ov/[name] re:  Intermittent chest tight x two months/  Maintaining on ppi/ pepcid daily and no maint pulmonary  meds/ has symbicort but using neb instead which seems to help "for a few days"  Chief Complaint  Patient presents with  . Acute Visit    Breathing is progressively worse since her last visit. She states she tires very easily.   no sign cough/ not limited by breathing but but "tired in my chest" and R knee pain  Severe fatigue since ?  One year  Sleeping really well s noct cough/sob  Last neb was last week no increase of symbicort / extremely confused again with meds rec Restart symbicort 80 Take 2 puffs first thing in am and then another 2 puffs about 12 hours later Work on inhaler technique:  relax and gently blow all the way out then  take a nice smooth deep breath back in, triggering the inhaler at same time you start breathing in.       09/15/2016  f/u ov/[name] re: cough variant asthma vs pseudoasthma component/ not on any maint rx x for gerd rx  Chief Complaint  Patient presents with  . Acute Visit    worse sob/ cough  breathing overall worse x one year assoc with fatigue but breathing better with neb but that's all that helps and it only helps for a little while  Not clear why did not follow up after last ov and doesn't recall if symb 80 helped  Breathing better noct on cpap/ worse if bends over  Did not bring all meds as req rec Prednisone 10 mg take  4 each am x 2 days,   2 each am x 2 days,  1 each am x 2 days and stop  Plan A = Automatic = Symbicort 80 Take 2 puffs first thing in am and then another 2 puffs about 12  hours later.  work on Programme researcher, broadcasting/film/video B = Backup Only use your albuterol nebulizer up to every 4 hours  Please schedule a follow up office visit in 2  weeks, sooner if needed  with all medications /inhalers/ solutions in hand so we can verify exactly what you are taking. This includes all medications from all doctors and over the counters     01/16/2017  f/u ov/[name] re:  No med calendar / no symbicort / has saba hfa and neb not using  Chief Complaint  Patient presents with  . Follow-up    doing well, denies any complaints today.    again doing  just as well off symbicort maint rx x month s cough  Not limited by breathing from desired activities  But quite sedentary   No obvious day to day or daytime variability or assoc excess/ purulent sputum or mucus plugs or hemoptysis or cp or chest tightness, subjective wheeze or overt sinus or hb symptoms. No unusual exp hx or h/o childhood pna/ asthma or knowledge of premature birth.  Sleeping ok without nocturnal  or early am exacerbation  of respiratory  c/o's or need for noct saba. Also denies any obvious fluctuation of symptoms with weather or environmental changes or other aggravating or alleviating factors except as outlined above   Current Medications, Allergies, Complete Past Medical History, Past Surgical History, Family History, and Social History were reviewed in [employer] record.  ROS  The following are not active complaints unless bolded sore throat, dysphagia, dental problems, itching, sneezing,  nasal congestion or excess/ purulent secretions, ear ache,   fever, chills, sweats, unintended wt loss, classically pleuritic or exertional cp,  orthopnea pnd or leg swelling, presyncope, palpitations, abdominal pain, anorexia, nausea, vomiting, diarrhea  or change in bowel or bladder habits, change in stools or urine, dysuria,hematuria,  rash, arthralgias, visual complaints, headache, numbness, weakness or ataxia  or problems with walking or coordination,  change in mood/affect or memory.                 Objective:  Physical Exam  amb bf nad     04/28/2014      183 >  06/09/13 178 > 09/30/2014  181> 10/16/2014   182 > 02/23/2015  188 > 03/09/2015   192 > 08/01/2016   185 > 09/15/2016  184  > 01/16/2017   179     03/31/14 187 lb (84.823 kg)  02/17/14 187 lb (84.823 kg)  01/10/14 188 lb 6.4 oz (85.458 kg)     Vital signs reviewed - Note on arrival 02 sats  99% on RA       HEENT: full upper, lower partial dentures, nl turbinates, and orophanx. Nl external ear canals without cough reflex   NECK :  without JVD/Nodes/TM/ nl carotid upstrokes bilaterally   LUNGS: no acc muscle use,  Completely clear to A and P    CV:  RRR  no s3,  II/VI SEM  no increase in P2, no edema   ABD:  soft and nontender with nl excursion in the supine position. No bruits or organomegaly, bowel sounds nl  MS:  warm without deformities, calf tenderness, cyanosis or clubbing                   Assessment & Plan:

## 2017-01-16 NOTE — Patient Instructions (Addendum)
Plan A = Automatic =  symbicort 80 up to 2 pffs every 12 hours (ok to skip but only great)  Plan B = Backup Only use your albuterol as a rescue medication to be used if you can't catch your breath by resting or doing a relaxed purse lip breathing pattern.  - The less you use it, the better it will work when you need it. - Ok to use the inhaler up to 2 puffs  every 4 hours if you must but call for appointment if use goes up over your usual need - Don't leave home without it !!  (think of it like the spare tire for your car)   Plan C = Crisis - only use your albuterol nebulizer if you first try Plan B and it fails to help > ok to use the nebulizer up to every 4 hours but if start needing it regularly call for immediate appointment    Work on inhaler technique:  relax and gently blow all the way out then take a nice smooth deep breath back in, triggering the inhaler at same time you start breathing in.  Hold for up to 5 seconds if you can. Blow out thru nose. Rinse and gargle with water when done    Please schedule a follow up visit in 6  months but call sooner if needed  with all medications /inhalers/ solutions in hand so we can verify exactly what you are taking. This includes all medications from all doctors and over the counters

## 2017-01-17 NOTE — Assessment & Plan Note (Signed)
PFTs in 03/2009 were essentially nml, no obstruction, nml lung volumes & diffusion - improved symptoms p stopped losartan 04/01/14 - 04/28/2014 p extensive coaching HFA effectiveness =    90%  - symptoms improved off all rx 10/16/14 so rec prn saba only  - symptoms flared over the summer of 2016 so 03/09/2015  >> rec restart dulera 100 2 every 12h  - flared of symbicort 08/01/2016  With FEN0 = 48 and spirometry uninterpretable >  try 80 2bid> not clear she took it/ whether or not responded      - 09/15/2016  Active wheeze >> After extensive coaching HFA effectiveness =    75% > rechallenge with symb  - 01/16/2017 again d/c'd symb but doing well x months so rec use "prn" - 01/16/2017  After extensive coaching HFA effectiveness =    75% (short Ti)   Based on the study from NEJM  378; 20 p 1865 (2018) in pts with mild asthma it is reasonable to use low dose symbicort eg 80 2bid "prn" flare in this setting but I emphasized this was only shown with symbicort and takes advantage of the rapid onset of action but is not the same as "rescue therapy" but can be stopped once the acute symptoms have resolved and the need for rescue has been minimized (< 2 x weekly)    Each maintenance medication was reviewed in detail including most importantly the difference between maintenance and as needed and under what circumstances the prns are to be used.  Please see AVS for specific  Instructions which are unique to this visit and I personally typed out  which were reviewed in detail in writing with the patient and a copy provided.

## 2017-01-23 ENCOUNTER — Telehealth: Payer: Self-pay | Admitting: Internal Medicine

## 2017-01-23 NOTE — Telephone Encounter (Signed)
It seem unusual that Ms. Schwarz would have chest pain with switch from valsartan to irbesartan. However, since pain is temporally related to medication change, we should stop irbesartan and try losartan 100 mg daily. I would continue isosorbide mononitrate for now. Can we work her in to be seen by me or one of the APP's later this week? If pain worsens in the meantime, she should call 911.  [name], MD [facility] Pager: [phone]

## 2017-01-23 NOTE — Telephone Encounter (Signed)
[name] [name] is calling about her medication ( Irbesartan , Isosorbide) Please call

## 2017-01-23 NOTE — Telephone Encounter (Signed)
Patient c/o that avapro 300 mg & imdur 30 mg that she is taking daily is causing her chest to hurt.  Patient said that she started avapro last week and has been on imdur since 12/23/16 and said that her chest pain started hurting last week after starting avapro and rated 5/10, has been an intermittent stabbing pain that last a few minutes. Patient did not use her nitroglycerin when she had the chest pain. No active chest pain.  No c/o sob or dizziness. Patient advised that chest pain is not usually caused by avapro but this message would be sent to her doctor for further advice. Patient advised that if her symptoms got worse that she needed to go to the ED for an evaluation.

## 2017-01-23 NOTE — Telephone Encounter (Signed)
I discussed with patient, pt has been scheduled to see Jacolyn Reedy, PA tomorrow at 2:15PM, will not change irbesartan to losartan to this time, pt will discuss with [facility] tomorrow.  Pt knows if chest pain worsens she should call 911.

## 2017-01-24 ENCOUNTER — Ambulatory Visit: Payer: PPO | Admitting: Physician Assistant

## 2017-01-24 DIAGNOSIS — T2661XA Corrosion of cornea and conjunctival sac, right eye, initial encounter: Secondary | ICD-10-CM | POA: Diagnosis not present

## 2017-01-24 DIAGNOSIS — H25013 Cortical age-related cataract, bilateral: Secondary | ICD-10-CM | POA: Diagnosis not present

## 2017-01-24 DIAGNOSIS — H40033 Anatomical narrow angle, bilateral: Secondary | ICD-10-CM | POA: Diagnosis not present

## 2017-01-24 DIAGNOSIS — H40023 Open angle with borderline findings, high risk, bilateral: Secondary | ICD-10-CM | POA: Diagnosis not present

## 2017-01-24 DIAGNOSIS — H2513 Age-related nuclear cataract, bilateral: Secondary | ICD-10-CM | POA: Diagnosis not present

## 2017-01-24 DIAGNOSIS — H18413 Arcus senilis, bilateral: Secondary | ICD-10-CM | POA: Diagnosis not present

## 2017-02-11 NOTE — Progress Notes (Signed)
Follow-up Outpatient Visit Date: 02/13/2017  Primary Care Provider: Jarome Matin (Inactive) Hughes  Chief Complaint: Follow-up coronary artery disease  HPI:  [name] [name] is a 81 y.o. year-old female with history of multivessel CAD being managed medically due to poor PCI and CABG targets, hypertension, hyperlipidemia, PACs and PVCs, obstructive sleep apnea not on CPAP, cough variant asthma, hepatitis B and C, and GERD, who presents for follow-up of coronary artery disease. I last saw [name] [name] in June, at which time she reported occasional episodes of mild chest burning without radiation when walking briskly. In terms had been stable for quite some time and had not required sublingual nitroglycerin. We agreed to start isosorbide mononitrate 30 mg daily. She contacted [place] in late August due to chest pain that occurred after switching from valsartan to irbesartan. I recommended switching to losartan, though Ms. Menees continues on irbesartan today.  Today, [name] [name] reports that she feels fairly well. Exertional chest discomfort that she described at our last visit has resolved with sublingual nitroglycerin. However, she continues to have occasional burning that she has attributed to acid reflux in the past. She remains on pantoprazole and famotidine. She also notes occasional brief chest pain when bending over. Initially, she was taking isosorbide mononitrate 15 mg daily due to some GI issues and bloating. These have improved and she is now tolerating 30 mg daily. She continues to use sublingual nitroglycerin every few weeks. She noted increased diaphoresis when outside on irbesartan, which continues. [name] [name] has not had any shortness of breath, palpitations, lightheadedness, or edema.  --------------------------------------------------------------------------------------------------  Cardiovascular History & Procedures: Cardiovascular Problems:  Multivessel coronary artery  disease  PACs and PVCs  Risk Factors:  Known coronary artery disease, hypertension, and age > 67  Cath/PCI:  LHC (02/20/08): LMCA with 20% distal stenosis. LAD with serial 70% and 60% lesions in the midportion. Distal LAD is subtotally occluded. D1 with 50% stenosis. Ramus is small vessel with 60-70% mid stenosis. LCx with luminal irregularities. OM1 has 50% stenosis. Dominant RCA with proximal CTO. LVEF 60% with mid inferior hypokinesis. LVEDP 16 mmHg.  CV Surgery:  None  EP Procedures and Devices:  None  Non-Invasive Evaluation(s):  Pharmacologic myocardial perfusion stress test (02/19/15): Intermediate risk study with small in size, moderate in intensity reversible apical and apical inferior defect. Inferolateral wall is hypokinetic with LVEF 50%.  Recent CV Pertinent Labs: Lab Results  Component Value Date   CHOL 142 01/10/2017   HDL 58 01/10/2017   LDLCALC 70 01/10/2017   TRIG 69 01/10/2017   CHOLHDL 2.4 01/10/2017   CHOLHDL 2.0 06/16/2015   INR 0.93 07/04/2010   K 3.9 08/29/2016   BUN 18 08/29/2016   CREATININE 0.90 08/29/2016   CREATININE 0.86 01/28/2016    Past medical and surgical history were reviewed and updated in EPIC.  Current Meds  Medication Sig  . acetaminophen (TYLENOL) 325 MG tablet Per bottle as needed  . albuterol (PROAIR HFA) 108 (90 Base) MCG/ACT inhaler Inhale 2 puffs into the lungs every 4 (four) hours as needed for wheezing or shortness of breath.  Marland Kitchen albuterol (PROVENTIL) (2.5 MG/3ML) 0.083% nebulizer solution Take 3 mLs (2.5 mg total) by nebulization every 6 (six) hours as needed for wheezing or shortness of breath.  . budesonide-formoterol (SYMBICORT) 80-4.5 MCG/ACT inhaler Inhale 2 puffs into the lungs 2 (two) times daily.  . clopidogrel (PLAVIX) 75 MG tablet TAKE 1 TABLET (75 MG TOTAL) BY MOUTH DAILY.  . ergocalciferol (VITAMIN D2) 50000 units capsule  Take 50,000 Units by mouth once a week.  . famotidine (PEPCID) 20 MG tablet Take 20  mg by mouth at bedtime.  . hydrochlorothiazide (HYDRODIURIL) 25 MG tablet Take 1 tablet (25 mg total) by mouth daily.  . irbesartan (AVAPRO) 300 MG tablet Take 1 tablet (300 mg total) by mouth daily.  . isosorbide mononitrate (IMDUR) 30 MG 24 hr tablet Take 1/2 tablet (15 mg) by mouth daily  . nitroGLYCERIN (NITROSTAT) 0.4 MG SL tablet Place 1 tablet (0.4 mg total) under the tongue every 5 (five) minutes as needed for chest pain.  . pantoprazole (PROTONIX) 40 MG tablet Take 40 mg by mouth daily.    Allergies: Aspirin; Clarithromycin; Doxycycline; Pravastatin; Levaquin [levofloxacin in d5w]; and Penicillins  Social History   Social History  . Marital status: Married    Spouse name: Lollie Sails  . Number of children: 4  . Years of education: 12   Occupational History  . retired  Retired    [employer]   Social History Main Topics  . Smoking status: Never Smoker  . Smokeless tobacco: Never Used     Comment: 2nd hand exposure x40 years   . Alcohol use No  . Drug use: No  . Sexual activity: Not Currently   Other Topics Concern  . Not on file   Social History Narrative   Married, lives with husband; has children.   Retired Government social research officer.       Cell # [phone]; okay to leave a message.           Family History  Problem Relation Age of Onset  . Asthma Mother   . Arthritis Mother        rheumatiod arthritis  . Stroke Mother   . Asthma Sister   . Stroke Sister   . COPD Sister   . Emphysema Sister   . Colon cancer Maternal Aunt   . Lung cancer Maternal Aunt   . Pancreatic cancer Brother     Review of Systems: A 12-system review of systems was performed and was negative except as noted in the HPI.  --------------------------------------------------------------------------------------------------  Physical Exam: BP 128/82   Pulse 80   Ht 5\' 1"  (1.549 m)   Wt 181 lb (82.1 kg)   SpO2 97%   BMI 34.20 kg/m   General:  Obese woman, seated comfortably  in the exam room. HEENT: No conjunctival pallor or scleral icterus.  Moist mucous membranes.  OP clear. Neck: Supple without lymphadenopathy, thyromegaly, JVD, or HJR.  Lungs: Normal work of breathing.  Clear to auscultation bilaterally without wheezes or crackles. Heart: Regular rate and rhythm without murmurs, rubs, or gallops.  Non-displaced PMI. Abd: Bowel sounds present.  Soft, NT/ND without hepatosplenomegaly Ext: No lower extremity edema.  Radial, PT, and DP pulses are 2+ bilaterally. Skin: Warm and dry without rash.  EKG: Normal sinus rhythm with first-degree AV block and inferior Q waves. No significant change from prior tracing on 08/02/16.  Lab Results  Component Value Date   WBC 6.6 08/01/2016   HGB 13.2 08/01/2016   HCT 39.2 08/01/2016   MCV 89 08/01/2016   PLT 153 08/01/2016    Lab Results  Component Value Date   NA 144 08/29/2016   K 3.9 08/29/2016   CL 103 08/29/2016   CO2 25 08/29/2016   BUN 18 08/29/2016   CREATININE 0.90 08/29/2016   GLUCOSE 109 (H) 08/29/2016   ALT 8 08/01/2016    Lab Results  Component Value Date  CHOL 142 01/10/2017   HDL 58 01/10/2017   LDLCALC 70 01/10/2017   TRIG 69 01/10/2017   CHOLHDL 2.4 01/10/2017   --------------------------------------------------------------------------------------------------  ASSESSMENT AND PLAN: Coronary artery disease with stable angina [name] [name] has noted some improvement in her exertional chest pain with isosorbide mononitrate. She continues to have intermittent burning that she attributes to GERD. Given her significant CAD on past catheterization, I wonder some of this could also reflect angina. We discussed medical therapy versus repeating catheterization and have agreed to increase isosorbide mononitrate to 60 mg daily. If she continues to have the pain, we will need to consider cardiac catheterization. If her coronaries appear stable, she would benefit from further GI evaluation. We will  continue with clopidogrel and rosuvastatin for secondary prevention. Of note, she has a history of aspirin allergy but has tolerated low-dose aspirin in the past. If PCI is needed in the future, I think it would be fine for her to be treated with dual antiplatelet therapy.  Hypertension Blood pressure is well controlled today. We will continue current doses of irbesartan and HCTZ and check a basic metabolic panel today, given recent change from valsartan to irbesartan.  Hyperlipidemia Continue rosuvastatin 20 mg daily. Most recent LDL last month was 70.  Follow-up: Return to clinic in one month.  [name], MD 02/13/2017 8:47 AM

## 2017-02-13 ENCOUNTER — Encounter: Payer: Self-pay | Admitting: Internal Medicine

## 2017-02-13 ENCOUNTER — Ambulatory Visit (INDEPENDENT_AMBULATORY_CARE_PROVIDER_SITE_OTHER): Payer: PPO | Admitting: Internal Medicine

## 2017-02-13 VITALS — BP 128/82 | HR 80 | Ht 61.0 in | Wt 181.0 lb

## 2017-02-13 DIAGNOSIS — I25118 Atherosclerotic heart disease of native coronary artery with other forms of angina pectoris: Secondary | ICD-10-CM | POA: Diagnosis not present

## 2017-02-13 DIAGNOSIS — I10 Essential (primary) hypertension: Secondary | ICD-10-CM | POA: Diagnosis not present

## 2017-02-13 DIAGNOSIS — E785 Hyperlipidemia, unspecified: Secondary | ICD-10-CM | POA: Diagnosis not present

## 2017-02-13 DIAGNOSIS — R079 Chest pain, unspecified: Secondary | ICD-10-CM | POA: Diagnosis not present

## 2017-02-13 LAB — BASIC METABOLIC PANEL
BUN/Creatinine Ratio: 20 (ref 12–28)
BUN: 20 mg/dL (ref 8–27)
CO2: 21 mmol/L (ref 20–29)
Calcium: 8.8 mg/dL (ref 8.7–10.3)
Chloride: 107 mmol/L — ABNORMAL HIGH (ref 96–106)
Creatinine, Ser: 1 mg/dL (ref 0.57–1.00)
GFR calc Af Amer: 62 mL/min/{1.73_m2} (ref >59)
GFR calc non Af Amer: 54 mL/min/{1.73_m2} — ABNORMAL LOW (ref >59)
Glucose: 94 mg/dL (ref 65–99)
Potassium: 3.8 mmol/L (ref 3.5–5.2)
Sodium: 142 mmol/L (ref 134–144)

## 2017-02-13 MED ORDER — ISOSORBIDE MONONITRATE ER 60 MG PO TB24: 60.0000 mg | ORAL_TABLET | Freq: Every day | ORAL | 1 refills | Status: DC

## 2017-02-13 MED ORDER — NITROGLYCERIN 0.4 MG SL SUBL: 0.4000 mg | SUBLINGUAL_TABLET | SUBLINGUAL | 1 refills | Status: DC | PRN

## 2017-02-13 NOTE — Patient Instructions (Signed)
Medication Instructions:  Increase Imdur (isosorbide) to 60 mg daily.  Your Nitroglycerin prescription has been refilled.  Labwork: BMET today  Testing/Procedures: None   Follow-Up: Your physician recommends that you schedule a follow-up appointment in: 1 month with Dr End.        If you need a refill on your cardiac medications before your next appointment, please call your pharmacy.

## 2017-02-28 DIAGNOSIS — E669 Obesity, unspecified: Secondary | ICD-10-CM | POA: Diagnosis not present

## 2017-02-28 DIAGNOSIS — K219 Gastro-esophageal reflux disease without esophagitis: Secondary | ICD-10-CM | POA: Diagnosis not present

## 2017-02-28 DIAGNOSIS — K5904 Chronic idiopathic constipation: Secondary | ICD-10-CM | POA: Diagnosis not present

## 2017-02-28 DIAGNOSIS — K5730 Diverticulosis of large intestine without perforation or abscess without bleeding: Secondary | ICD-10-CM | POA: Diagnosis not present

## 2017-03-17 ENCOUNTER — Encounter: Payer: Self-pay | Admitting: Internal Medicine

## 2017-03-17 ENCOUNTER — Ambulatory Visit (INDEPENDENT_AMBULATORY_CARE_PROVIDER_SITE_OTHER): Payer: PPO | Admitting: Internal Medicine

## 2017-03-17 VITALS — BP 110/70 | HR 80 | Ht 61.0 in | Wt 181.0 lb

## 2017-03-17 DIAGNOSIS — I10 Essential (primary) hypertension: Secondary | ICD-10-CM

## 2017-03-17 DIAGNOSIS — I25118 Atherosclerotic heart disease of native coronary artery with other forms of angina pectoris: Secondary | ICD-10-CM

## 2017-03-17 DIAGNOSIS — E785 Hyperlipidemia, unspecified: Secondary | ICD-10-CM | POA: Diagnosis not present

## 2017-03-17 NOTE — Progress Notes (Signed)
Follow-up Outpatient Visit Date: 03/17/2017  Primary Care Provider: Jarome Matin (Inactive) [place]  Chief Complaint: Follow-up coronary artery disease and chest pain  HPI:  [name] [name] is a 81 y.o. year-old female with history of multivessel CAD being managed medically due to poor PCI and CABG targets, hypertension, hyperlipidemia, PACs and PVCs, obstructive sleep apnea not on CPAP, cough variant asthma, hepatitis B and C, and GERD, who presents for follow-up of chest pain. I last saw her a month ago, at which time she noted increased chest burning. She attributed this to GERD, though it was somewhat exertional and also improved with nitroglycerin. We agreed to increase isosorbide moninitrate to 60 mg daily, which has helped. She notes only one episode of chest pain over the last month, which resolved with ASA 81 mg x 1. [name] [name] has not needed to use sublingual NTG. She has been under quite a bit of stress recently with her husband's worsening dementia. [name] [name] has stable exertional dyspnea. She denies palpitations, lightheadedness, and edema.Marland Kitchen  --------------------------------------------------------------------------------------------------  Cardiovascular History & Procedures: Cardiovascular Problems:  Multivessel coronary artery disease  PACs and PVCs  Risk Factors:  Known coronary artery disease, hypertension, and age > 52  Cath/PCI:  LHC (02/20/08): LMCA with 20% distal stenosis. LAD with serial 70% and 60% lesions in the midportion. Distal LAD is subtotally occluded. D1 with 50% stenosis. Ramus is small vessel with 60-70% mid stenosis. LCx with luminal irregularities. OM1 has 50% stenosis. Dominant RCA with proximal CTO. LVEF 60% with mid inferior hypokinesis. LVEDP 16 mmHg.  CV Surgery:  None  EP Procedures and Devices:  None  Non-Invasive Evaluation(s):  Pharmacologic myocardial perfusion stress test (02/19/15): Intermediate risk study with small in  size, moderate in intensity reversible apical and apical inferior defect. Inferolateral wall is hypokinetic with LVEF 50%.  Recent CV Pertinent Labs: Lab Results  Component Value Date   CHOL 142 01/10/2017   HDL 58 01/10/2017   LDLCALC 70 01/10/2017   TRIG 69 01/10/2017   CHOLHDL 2.4 01/10/2017   CHOLHDL 2.0 06/16/2015   INR 0.93 07/04/2010   K 3.8 02/13/2017   BUN 20 02/13/2017   CREATININE 1.00 02/13/2017   CREATININE 0.86 01/28/2016    Past medical and surgical history were reviewed and updated in EPIC.  Current Meds  Medication Sig  . acetaminophen (TYLENOL) 325 MG tablet Per bottle as needed  . albuterol (PROAIR HFA) 108 (90 Base) MCG/ACT inhaler Inhale 2 puffs into the lungs every 4 (four) hours as needed for wheezing or shortness of breath.  Marland Kitchen albuterol (PROVENTIL) (2.5 MG/3ML) 0.083% nebulizer solution Take 3 mLs (2.5 mg total) by nebulization every 6 (six) hours as needed for wheezing or shortness of breath.  . budesonide-formoterol (SYMBICORT) 80-4.5 MCG/ACT inhaler Inhale 2 puffs into the lungs 2 (two) times daily.  . clopidogrel (PLAVIX) 75 MG tablet TAKE 1 TABLET (75 MG TOTAL) BY MOUTH DAILY.  . ergocalciferol (VITAMIN D2) 50000 units capsule Take 50,000 Units by mouth once a week.  . famotidine (PEPCID) 20 MG tablet Take 20 mg by mouth at bedtime.  . hydrochlorothiazide (HYDRODIURIL) 25 MG tablet Take 1 tablet (25 mg total) by mouth daily.  . irbesartan (AVAPRO) 300 MG tablet Take 1 tablet (300 mg total) by mouth daily.  . isosorbide mononitrate (IMDUR) 60 MG 24 hr tablet Take 1 tablet (60 mg total) by mouth daily.  . nitroGLYCERIN (NITROSTAT) 0.4 MG SL tablet Place 1 tablet (0.4 mg total) under the tongue every 5 (five)  minutes as needed for chest pain.  . pantoprazole (PROTONIX) 40 MG tablet Take 40 mg by mouth daily.    Allergies: Aspirin; Clarithromycin; Doxycycline; Pravastatin; Levaquin [levofloxacin in d5w]; and Penicillins  Social History   Social  History  . Marital status: Married    Spouse name: Lollie Sails  . Number of children: 4  . Years of education: 12   Occupational History  . retired  Retired    [employer]   Social History Main Topics  . Smoking status: Never Smoker  . Smokeless tobacco: Never Used     Comment: 2nd hand exposure x40 years   . Alcohol use No  . Drug use: No  . Sexual activity: Not Currently   Other Topics Concern  . Not on file   Social History Narrative   Married, lives with husband; has children.   Retired Government social research officer.       Cell # [phone]; okay to leave a message.           Family History  Problem Relation Age of Onset  . Asthma Mother   . Arthritis Mother        rheumatiod arthritis  . Stroke Mother   . Asthma Sister   . Stroke Sister   . COPD Sister   . Emphysema Sister   . Colon cancer Maternal Aunt   . Lung cancer Maternal Aunt   . Pancreatic cancer Brother     Review of Systems: A 12-system review of systems was performed and was negative except as noted in the HPI.  --------------------------------------------------------------------------------------------------  Physical Exam: BP 110/70   Pulse 80   Ht 5\' 1"  (1.549 m)   Wt 181 lb (82.1 kg)   SpO2 99%   BMI 34.20 kg/m   General:  Obese woman, seated comfortably in the exam room. HEENT: No conjunctival pallor or scleral icterus. Moist mucous membranes.  OP clear. Neck: Supple without lymphadenopathy, thyromegaly, JVD, or HJR.  Lungs: Normal work of breathing. Clear to auscultation bilaterally without wheezes or crackles. Heart: Regular rate and rhythm without murmurs, rubs, or gallops. Non-displaced PMI. Abd: Bowel sounds present. Soft, NT/ND without hepatosplenomegaly Ext: No lower extremity edema. Skin: Warm and dry without rash.  Lab Results  Component Value Date   WBC 6.6 08/01/2016   HGB 13.2 08/01/2016   HCT 39.2 08/01/2016   MCV 89 08/01/2016   PLT 153 08/01/2016    Lab  Results  Component Value Date   NA 142 02/13/2017   K 3.8 02/13/2017   CL 107 (H) 02/13/2017   CO2 21 02/13/2017   BUN 20 02/13/2017   CREATININE 1.00 02/13/2017   GLUCOSE 94 02/13/2017   ALT 8 08/01/2016    Lab Results  Component Value Date   CHOL 142 01/10/2017   HDL 58 01/10/2017   LDLCALC 70 01/10/2017   TRIG 69 01/10/2017   CHOLHDL 2.4 01/10/2017    --------------------------------------------------------------------------------------------------  ASSESSMENT AND PLAN: Coronary artery disease with stable angina Chest pain has improved with escalation of isosorbide mononitrate last month. We will defer repeat catheterization at this time, given multivessel CAD on prior cath with poor surgical and PCI targets. If she has worsening chest pain, we will need to readdress catheterization to see if any critical lesions could be intervened upon percutaneously.  Hypertension Blood pressure well-controlled today. No medication changes.  Hyperlipidemia Continue rosuvastatin 20 mg daily, as [name] [name] was intolerant of higher doses in the past. Most recent LDL in August was 70.  Follow-up: Return to clinic in 4 months.  [name], MD 03/18/2017 9:26 PM

## 2017-03-17 NOTE — Patient Instructions (Signed)
Medication Instructions:  Your physician recommends that you continue on your current medications as directed. Please refer to the Current Medication list given to you today.   Labwork: None   Testing/Procedures: None   Follow-Up: Your physician recommends that you schedule a follow-up appointment in: 4 months with Dr End.        If you need a refill on your cardiac medications before your next appointment, please call your pharmacy.   

## 2017-03-18 ENCOUNTER — Encounter: Payer: Self-pay | Admitting: Internal Medicine

## 2017-04-03 ENCOUNTER — Telehealth: Payer: Self-pay | Admitting: Internal Medicine

## 2017-04-03 NOTE — Telephone Encounter (Signed)
Please let [name] [name] know that joint pain, swelling, and shortness of breath are not typical side effects from Imdur. If she thinks that cutting back on the medication to 30 mg daily (which she had been tolerating for some time) would help, that is fine. If she continues to have these symptoms, she should speak with her PCP and/or rheumatologist for further evaluation. If she has worsening dyspnea, orthopnea, and edema (not just her joints), we should work her in to be seen by an APP this week.  [name], MD [facility] Pager: [phone]

## 2017-04-03 NOTE — Telephone Encounter (Signed)
New message    Patient states she may be having side effects from medication. Pain in joints and swelling in feet and frequent urination. Please call  Pt c/o medication issue:  1. Name of Medication: isosorbide mononitrate (IMDUR) 60 MG 24 hr tablet  2. How are you currently taking this medication (dosage and times per day)? Take 1 tablet (60 mg total) by mouth daily  3. Are you having a reaction (difficulty breathing--STAT)? NO  4. What is your medication issue? Patient thinks medication is causing pain in joints    Pt c/o swelling: STAT is pt has developed SOB within 24 hours  1) How much weight have you gained and in what time span? N/A  2) If swelling, where is the swelling located?  Both and ankles  3) Are you currently taking a fluid pill? yes  4) Are you currently SOB? " a little"  5) Do you have a log of your daily weights (if so, list)? Last week 179, this week 184  6) Have you gained 3 pounds in a day or 5 pounds in a week? unsure  7) Have you traveled recently? NO

## 2017-04-03 NOTE — Telephone Encounter (Signed)
I spoke with patient-for the last couple of weeks she has been having significant generalized pain --hurting all over. Pt states her feet and ankles have been swollen over the week-end, some shortness of breath, feels related to asthma.  Pt stopped taking Imdur on Saturday, states pain has improved but still very uncomfortable, unable to get relief from tylenol. Pt states her shortness of breath is better although still has some swelling in her feet and ankles, denies any change in diet/increase in sodium intake. She has considered calling rheumatologist or orthopedist about current symptoms but feels symptoms related to increase in Imdur dose in September.   Pt advised I will forward to Dr End for review, she will continue to stay off Imdur until review by Dr End.

## 2017-04-03 NOTE — Telephone Encounter (Signed)
I spoke with patient,she is aware of Dr Serita Kyle recommendations, verbalized understanding, agreed with plan. She knows to call if symptoms of shortness of breath, edema do not improve, she did not want an appointment here right now, she will monitor symptoms.

## 2017-04-05 DIAGNOSIS — M48061 Spinal stenosis, lumbar region without neurogenic claudication: Secondary | ICD-10-CM | POA: Diagnosis not present

## 2017-04-11 DIAGNOSIS — Z6833 Body mass index (BMI) 33.0-33.9, adult: Secondary | ICD-10-CM | POA: Diagnosis not present

## 2017-04-11 DIAGNOSIS — N6452 Nipple discharge: Secondary | ICD-10-CM | POA: Diagnosis not present

## 2017-04-14 ENCOUNTER — Other Ambulatory Visit: Payer: Self-pay | Admitting: Internal Medicine

## 2017-04-14 DIAGNOSIS — N6452 Nipple discharge: Secondary | ICD-10-CM

## 2017-04-21 ENCOUNTER — Ambulatory Visit: Payer: PPO

## 2017-04-21 ENCOUNTER — Ambulatory Visit
Admission: RE | Admit: 2017-04-21 | Discharge: 2017-04-21 | Disposition: A | Discharge status: Home / Self Care | Payer: PPO | Source: Ambulatory Visit | Attending: Internal Medicine | Admitting: Internal Medicine

## 2017-04-21 DIAGNOSIS — N6452 Nipple discharge: Secondary | ICD-10-CM

## 2017-04-21 DIAGNOSIS — R928 Other abnormal and inconclusive findings on diagnostic imaging of breast: Secondary | ICD-10-CM | POA: Diagnosis not present

## 2017-05-18 ENCOUNTER — Other Ambulatory Visit: Payer: Self-pay | Admitting: Internal Medicine

## 2017-05-18 DIAGNOSIS — E785 Hyperlipidemia, unspecified: Secondary | ICD-10-CM

## 2017-05-18 DIAGNOSIS — I25118 Atherosclerotic heart disease of native coronary artery with other forms of angina pectoris: Secondary | ICD-10-CM

## 2017-05-18 NOTE — Telephone Encounter (Signed)
Please review for refill, thanks ! 

## 2017-06-21 ENCOUNTER — Other Ambulatory Visit: Payer: Self-pay | Admitting: Internal Medicine

## 2017-06-21 DIAGNOSIS — I25118 Atherosclerotic heart disease of native coronary artery with other forms of angina pectoris: Secondary | ICD-10-CM

## 2017-06-21 DIAGNOSIS — E785 Hyperlipidemia, unspecified: Secondary | ICD-10-CM

## 2017-06-21 NOTE — Telephone Encounter (Signed)
Please review for refill. Thanks!  

## 2017-06-30 DIAGNOSIS — R52 Pain, unspecified: Secondary | ICD-10-CM | POA: Diagnosis not present

## 2017-06-30 DIAGNOSIS — J111 Influenza due to unidentified influenza virus with other respiratory manifestations: Secondary | ICD-10-CM | POA: Diagnosis not present

## 2017-06-30 DIAGNOSIS — Z6833 Body mass index (BMI) 33.0-33.9, adult: Secondary | ICD-10-CM | POA: Diagnosis not present

## 2017-06-30 DIAGNOSIS — J019 Acute sinusitis, unspecified: Secondary | ICD-10-CM | POA: Diagnosis not present

## 2017-07-13 ENCOUNTER — Encounter: Payer: Self-pay | Admitting: Internal Medicine

## 2017-07-13 ENCOUNTER — Ambulatory Visit: Payer: PPO | Admitting: Internal Medicine

## 2017-07-13 VITALS — BP 120/88 | HR 84 | Ht 61.0 in | Wt 180.6 lb

## 2017-07-13 DIAGNOSIS — I25118 Atherosclerotic heart disease of native coronary artery with other forms of angina pectoris: Secondary | ICD-10-CM | POA: Diagnosis not present

## 2017-07-13 DIAGNOSIS — E785 Hyperlipidemia, unspecified: Secondary | ICD-10-CM

## 2017-07-13 DIAGNOSIS — I350 Nonrheumatic aortic (valve) stenosis: Secondary | ICD-10-CM | POA: Insufficient documentation

## 2017-07-13 DIAGNOSIS — I10 Essential (primary) hypertension: Secondary | ICD-10-CM | POA: Diagnosis not present

## 2017-07-13 MED ORDER — NITROGLYCERIN 0.4 MG SL SUBL: 0.4000 mg | SUBLINGUAL_TABLET | SUBLINGUAL | 1 refills | Status: DC | PRN

## 2017-07-13 NOTE — Progress Notes (Signed)
Follow-up Outpatient Visit Date: 07/13/2017  Primary Care Provider: Jarome Matin (Inactive) [place]  Chief Complaint: Follow-up stable angina  HPI:  [name] is a 82 y.o. year-old female with history of multivessel CAD being managed medically due to poor PCI and CABG targets, hypertension, hyperlipidemia, PACs and PVCs, obstructive sleep apnea not on CPAP, cough variant asthma, hepatitis B and C, and GERD, who presents for follow-up of stable angina.  I last saw [name] [name] in October following escalation of isosorbide mononitrate due to increased chest pain.  At our last visit, she reported only a single episode of chest pain.  She was otherwise doing well.  She contacted our office later that month complaining of pain and swelling in her ankles as well as shortness of breath that she attributed to asthma.  Symptoms had improved with discontinuation of isosorbide mononitrate.  Today, [name] [name] reports feeling well.  She noted fatigue and a "floating" sensation while on isosorbide mononitrate, which has since resolved.  She denies chest pain, shortness of breath, palpitations, lightheadedness, and edema.  About once a month, she notes a "tiredness" in her chest when walking.  This resolves after a few minutes.  Other days, she is able to walk without any difficulties.  She has not needed to take any sublingual nitroglycerin she remains on clopidogrel without bleeding.  She remains under considerable stress at home, caring for her husband with progressing dementia.  --------------------------------------------------------------------------------------------------  Cardiovascular History & Procedures: Cardiovascular Problems:  Multivessel coronary artery disease  PACs and PVCs  Risk Factors:  Known coronary artery disease, hypertension, and age > 51  Cath/PCI:  LHC (02/20/08): LMCA with 20% distal stenosis. LAD with serial 70% and 60% lesions in the midportion. Distal LAD is  subtotally occluded. D1 with 50% stenosis. Ramus is small vessel with 60-70% mid stenosis. LCx with luminal irregularities. OM1 has 50% stenosis. Dominant RCA with proximal CTO. LVEF 60% with mid inferior hypokinesis. LVEDP 16 mmHg.  CV Surgery:  None  EP Procedures and Devices:  None  Non-Invasive Evaluation(s):  Pharmacologic myocardial perfusion stress test (02/19/15): Intermediate risk study with small in size, moderate in intensity reversible apical and apical inferior defect. Inferolateral wall is hypokinetic with LVEF 50%.  Recent CV Pertinent Labs: Lab Results  Component Value Date   CHOL 142 01/10/2017   HDL 58 01/10/2017   LDLCALC 70 01/10/2017   TRIG 69 01/10/2017   CHOLHDL 2.4 01/10/2017   CHOLHDL 2.0 06/16/2015   INR 0.93 07/04/2010   K 3.8 02/13/2017   BUN 20 02/13/2017   CREATININE 1.00 02/13/2017   CREATININE 0.86 01/28/2016    Past medical and surgical history were reviewed and updated in EPIC.  Current Meds  Medication Sig  . acetaminophen (TYLENOL) 325 MG tablet Per bottle as needed  . albuterol (PROAIR HFA) 108 (90 Base) MCG/ACT inhaler Inhale 2 puffs into the lungs every 4 (four) hours as needed for wheezing or shortness of breath.  Marland Kitchen albuterol (PROVENTIL) (2.5 MG/3ML) 0.083% nebulizer solution Take 3 mLs (2.5 mg total) by nebulization every 6 (six) hours as needed for wheezing or shortness of breath.  . budesonide-formoterol (SYMBICORT) 80-4.5 MCG/ACT inhaler Inhale 2 puffs into the lungs 2 (two) times daily.  . clopidogrel (PLAVIX) 75 MG tablet TAKE 1 TABLET (75 MG TOTAL) BY MOUTH DAILY.  . ergocalciferol (VITAMIN D2) 50000 units capsule Take 50,000 Units by mouth once a week.  . famotidine (PEPCID) 20 MG tablet Take 20 mg by mouth at bedtime.  Marland Kitchen  hydrochlorothiazide (HYDRODIURIL) 25 MG tablet Take 1 tablet (25 mg total) by mouth daily.  . irbesartan (AVAPRO) 300 MG tablet Take 1 tablet (300 mg total) by mouth daily.  . nitroGLYCERIN (NITROSTAT) 0.4  MG SL tablet Place 1 tablet (0.4 mg total) under the tongue every 5 (five) minutes as needed for chest pain.  . pantoprazole (PROTONIX) 40 MG tablet Take 40 mg by mouth daily.  . rosuvastatin (CRESTOR) 20 MG tablet TAKE 1 TABLET BY MOUTH EVERY DAY    Allergies: Aspirin; Clarithromycin; Doxycycline; Pravastatin; Levaquin [levofloxacin in d5w]; and Penicillins  Social History   Socioeconomic History  . Marital status: Married    Spouse name: Lollie Sails  . Number of children: 4  . Years of education: 66  . Highest education level: Not on file  Social Needs  . Financial resource strain: Not on file  . Food insecurity - worry: Not on file  . Food insecurity - inability: Not on file  . Transportation needs - medical: Not on file  . Transportation needs - non-medical: Not on file  Occupational History  . 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 Smoking status: Never Smoker  . Smokeless tobacco: Never Used  . Tobacco comment: 2nd hand exposure x40 years   Substance and Sexual Activity  . Alcohol use: No  . Drug use: No  . Sexual activity: Not Currently  Other Topics Concern  . Not on file  Social History Narrative   Married, lives with husband; has children.   Retired Government social research officer.       Cell # [phone]; okay to leave a message.        Family History  Problem Relation Age of Onset  . Asthma Mother   . Arthritis Mother        rheumatiod arthritis  . Stroke Mother   . Asthma Sister   . Stroke Sister   . COPD Sister   . Emphysema Sister   . Colon cancer Maternal Aunt   . Lung cancer Maternal Aunt   . Pancreatic cancer Brother     Review of Systems: A 12-system review of systems was performed and was negative except as noted in the HPI.  --------------------------------------------------------------------------------------------------  Physical Exam: BP 120/88   Pulse 84   Ht 5\' 1"  (1.549 m)   Wt 180 lb 9.6  oz (81.9 kg)   SpO2 98%   BMI 34.12 kg/m   General: Obese woman, seated comfortably in the exam room. HEENT: No conjunctival pallor or scleral icterus. Moist mucous membranes.  OP clear. Neck: Supple without lymphadenopathy, thyromegaly, JVD, or HJR. No carotid bruit. Lungs: Normal work of breathing. Clear to auscultation bilaterally without wheezes or crackles. Heart: Regular rate and rhythm with 1/6 systolic murmur loudest at the right upper sternal border.  No rubs or gallops.  Unable to assess PMI due to body habitus. Abd: Bowel sounds present. Soft, NT/ND.  Unable to assess HSM due to body habitus. Ext: No lower extremity edema. Radial, PT, and DP pulses are 2+ bilaterally. Skin: Warm and dry without rash.  EKG: Normal sinus rhythm with first-degree AV block and single PVC.  Borderline LVH.  Inferior infarct and nonspecific T wave changes.  PVC is new.  Otherwise, no significant change since 02/13/17.  Lab Results  Component Value Date   WBC 6.6 08/01/2016   HGB 13.2 08/01/2016   HCT 39.2 08/01/2016   MCV 89 08/01/2016  PLT 153 08/01/2016    Lab Results  Component Value Date   NA 142 02/13/2017   K 3.8 02/13/2017   CL 107 (H) 02/13/2017   CO2 21 02/13/2017   BUN 20 02/13/2017   CREATININE 1.00 02/13/2017   GLUCOSE 94 02/13/2017   ALT 8 08/01/2016    Lab Results  Component Value Date   CHOL 142 01/10/2017   HDL 58 01/10/2017   LDLCALC 70 01/10/2017   TRIG 69 01/10/2017   CHOLHDL 2.4 01/10/2017    --------------------------------------------------------------------------------------------------  ASSESSMENT AND PLAN: Coronary artery disease with stable angina Overall, [name] [name] reports feeling better than at our last visit following discontinuation of isosorbide mononitrate.  She still continues to have vague "tiredness" occurring about once a month with ambulation.  We discussed medical therapy versus repeat cardiac catheterization.  We have agreed to defer  catheterization for now.  We will continue her current medications, including clopidogrel and atorvastatin.  Given history of obstructive lung disease, I will defer adding a beta-blocker.  Mild aortic stenosis Very mild AS noted on echo last year.  No symptoms to suggest worsening stenosis.  1/6 systolic murmur noted on exam today.  Hyperlipidemia Patient is tolerating rosuvastatin 20 mg daily well.  We we will have her come in for a fasting lipid panel and ALT at her convenience.  Hypertension Blood pressure reasonably well controlled today.  No medication changes at this time.  Follow-up: Return to clinic in 6 months, sooner if symptoms progress.  [name], MD 07/13/2017 9:51 AM

## 2017-07-13 NOTE — Patient Instructions (Addendum)
Medication Instructions:  Your physician recommends that you continue on your current medications as directed. Please refer to the Current Medication list given to you today.  -- If you need a refill on your cardiac medications before your next appointment, please call your pharmacy. --  Labwork: Needs to come back in 1 week... Fasting Lipid Panel ALT  Testing/Procedures: None ordered  Follow-Up: Your physician wants you to follow-up in: 6 MONTHS with Dr. Okey Dupre.    You will receive a reminder letter in the mail two months in advance. If you don't receive a letter, please call our office to schedule the follow-up appointment.  Thank you for choosing [facility]!!    Any Other Special Instructions Will Be Listed Below (If Applicable).

## 2017-07-17 ENCOUNTER — Encounter: Payer: Self-pay | Admitting: Internal Medicine

## 2017-07-17 ENCOUNTER — Ambulatory Visit: Payer: PPO | Admitting: Internal Medicine

## 2017-07-17 VITALS — BP 122/80 | HR 66 | Ht 61.0 in | Wt 177.0 lb

## 2017-07-17 DIAGNOSIS — R05 Cough: Secondary | ICD-10-CM

## 2017-07-17 DIAGNOSIS — R058 Other specified cough: Secondary | ICD-10-CM

## 2017-07-17 DIAGNOSIS — J45991 Cough variant asthma: Secondary | ICD-10-CM | POA: Diagnosis not present

## 2017-07-17 NOTE — Progress Notes (Signed)
Subjective:    Patient ID: [name] [name], female    DOB: 26-Feb-1936,    MRN: [id number]    Brief patient profile:  41 yobf  never smoker with CAD for FU of reactive airway disease - trigger ? GERD & mild AS / with completely nl lung function during flare 09/30/2014   10/29/09 - initial  This was of insidious onset - - She had an ER visit during a trip to [place] - echo nml LV fn, BNP 55, stress myoview nml, given cipro for 'bronchitis.'  CXR 08/10/09 shows bibasal interstitial prominence, lt volume loss with raised hemidiaphragm  CT angio chest 01/2008 was neg for PE, stable RUL 53m nodule as compared to 05/2005, no obvious fibrosis. CT sinuses 8/11 nml  She reports intermittent heartburn with protonix , had an EGD by Dr MCollene Maresin 11/10  PFTs in 03/2009 were essentially nml, no obstruction, nml lung volumes & diffusion.  [facility]. Spirometry 6/11 shows nml lung function, RAST neg, Labs showed neg ANA, and low ESR. RA factor was elevated.  ER visit on 01/09/10 - absolute eos count 800 ,BNP 69, CXR - cardiomegaly, was on prednisone .  Sep, 2011 >> Referred to Rheum (Ouida Sills - favors OA rather than RA, CCP neg, CK was high 709 - muscle cramps, crestor stopped Stopped advair x 2 weeks - 'ran out', beta blockers stopped     01/10/14 [name] eval  Key is to keep reflux under control Stay on protonix daytime & pepcid at night Keep appt with Dr MCollene MaresPrednisone 10 mg tabs  Take 2 tabs daily with food x 7ds, then 1 tab daily with food x 7ds then STOP  # 40 Stay on symbicort Albuterol MDI 2 puffs upto 4 times daily as needed for wheezing Start singulair 10 mg at bedtime> never started it    03/31/2014 f/u ov/Dynastee Brummell re: ? Asthma  Chief Complaint  Patient presents with  . Follow-up    Pt states that her breathing is doing better since the last visit. She states does not have rescue inhaler, and has neb but has not needed since last visit.   maintaining protonix 40 mg  ac and pepcid at bedtime but easily confused with names of meds and instructions rec Continue symbicort 160 Take 2 puffs first thing in am and then another 2 puffs about 12 hours later.  Work on inhaler technique:    Only use your albuterol as a rescue medication  Stop cozar and take diovan (valsartan) 160 mg one daily          08/01/2016 acute extended ov/Rakeen Gaillard re:  Intermittent chest tight x two months/  Maintaining on ppi/ pepcid daily and no maint pulmonary  meds/ has symbicort but using neb instead which seems to help "for a few days"  Chief Complaint  Patient presents with  . Acute Visit    Breathing is progressively worse since her last visit. She states she tires very easily.   no sign cough/ not limited by breathing but but "tired in my chest" and R knee pain  Severe fatigue since ?  One year  Sleeping really well s noct cough/sob  Last neb was last week no increase of symbicort / extremely confused again with meds rec Restart symbicort 80 Take 2 puffs first thing in am and then another 2 puffs about 12 hours later Work on inhaler technique:  relax and gently blow all the way out then  take a nice smooth deep breath back in, triggering the inhaler at same time you start breathing in.       09/15/2016  f/u ov/Angelic Schnelle re: cough variant asthma vs pseudoasthma component/ not on any maint rx x for gerd rx  Chief Complaint  Patient presents with  . Acute Visit    worse sob/ cough  breathing overall worse x one year assoc with fatigue but breathing better with neb but that's all that helps and it only helps for a little while  Not clear why did not follow up after last ov and doesn't recall if symb 80 helped  Breathing better noct on cpap/ worse if bends over  Did not bring all meds as req rec Prednisone 10 mg take  4 each am x 2 days,   2 each am x 2 days,  1 each am x 2 days and stop  Plan A = Automatic = Symbicort 80 Take 2 puffs first thing in am and then another 2 puffs about 12  hours later.  work on Programme researcher, broadcasting/film/video B = Backup Only use your albuterol nebulizer up to every 4 hours  Please schedule a follow up office visit in 2  weeks, sooner if needed  with all medications /inhalers/ solutions in hand so we can verify exactly what you are taking. This includes all medications from all doctors and over the counters     01/16/2017  f/u ov/Rhiana Morash re:  No med calendar / no symbicort / has saba hfa and neb not using  Chief Complaint  Patient presents with  . Follow-up    doing well, denies any complaints today.    again doing  just as well off symbicort maint rx x month s cough  rec Plan A = Automatic =  symbicort 80 up to 2 pffs every 12 hours (ok to skip   but only if doing  great) Plan B = Backup Only use your albuterol as a rescue medication   Plan C = Crisis - only use your albuterol nebulizer if you first try Plan B and it fails to help > ok to use the nebulizer up to every 4 hours but if start needing it regularly call for immediate appointment Work on inhaler technique:       07/17/2017  f/u ov/Mirely Pangle re:  Final f/u just using symb up to 80 2pff q 12 h prn   Dyspnea:  Not limited by breathing from desired activities  / no need for rescue rx  Cough: no Sleep: ok flat  No obvious day to day or daytime variability or assoc excess/ purulent sputum or mucus plugs or hemoptysis or cp or chest tightness, subjective wheeze or overt sinus or hb symptoms. No unusual exposure hx or h/o childhood pna/ asthma or knowledge of premature birth.  Sleeping ok flat without nocturnal  or early am exacerbation  of respiratory  c/o's or need for noct saba. Also denies any obvious fluctuation of symptoms with weather or environmental changes or other aggravating or alleviating factors except as outlined above   Current Allergies, Complete Past Medical History, Past Surgical History, Family History, and Social History were reviewed in [employer]  record.  ROS  The following are not active complaints unless bolded Hoarseness, sore throat, dysphagia, dental problems, itching, sneezing,  nasal congestion or discharge of excess mucus or purulent secretions, ear ache,   fever, chills, sweats, unintended wt loss or wt gain, classically pleuritic or  exertional cp,  orthopnea pnd or leg swelling, presyncope, palpitations, abdominal pain, anorexia, nausea, vomiting, diarrhea  or change in bowel habits or change in bladder habits, change in stools or change in urine, dysuria, hematuria,  rash, arthralgias, visual complaints, headache, numbness, weakness or ataxia or problems with walking or coordination,  change in mood/affect or memory.        Current Meds  Medication Sig  . acetaminophen (TYLENOL) 325 MG tablet Per bottle as needed  . albuterol (PROAIR HFA) 108 (90 Base) MCG/ACT inhaler Inhale 2 puffs into the lungs every 4 (four) hours as needed for wheezing or shortness of breath.  Marland Kitchen albuterol (PROVENTIL) (2.5 MG/3ML) 0.083% nebulizer solution Take 3 mLs (2.5 mg total) by nebulization every 6 (six) hours as needed for wheezing or shortness of breath.  . budesonide-formoterol (SYMBICORT) 80-4.5 MCG/ACT inhaler Inhale 2 puffs into the lungs 2 (two) times daily.  . clopidogrel (PLAVIX) 75 MG tablet TAKE 1 TABLET (75 MG TOTAL) BY MOUTH DAILY.  . ergocalciferol (VITAMIN D2) 50000 units capsule Take 50,000 Units by mouth once a week.  . famotidine (PEPCID) 20 MG tablet Take 20 mg by mouth at bedtime.  . hydrochlorothiazide (HYDRODIURIL) 25 MG tablet Take 1 tablet (25 mg total) by mouth daily.  . irbesartan (AVAPRO) 300 MG tablet Take 1 tablet (300 mg total) by mouth daily.  . nitroGLYCERIN (NITROSTAT) 0.4 MG SL tablet Place 1 tablet (0.4 mg total) under the tongue every 5 (five) minutes as needed for chest pain.  . pantoprazole (PROTONIX) 40 MG tablet Take 40 mg by mouth daily.  . rosuvastatin (CRESTOR) 20 MG tablet TAKE 1 TABLET BY MOUTH EVERY DAY                     Objective:  Physical Exam  Amb bf nad all smiles today   04/28/2014      183 >  06/09/13 178 > 09/30/2014  181> 10/16/2014   182 > 02/23/2015  188 > 03/09/2015   192 > 08/01/2016   185 > 09/15/2016  184  > 01/16/2017   179 > 07/17/2017   177     03/31/14 187 lb (84.823 kg)  02/17/14 187 lb (84.823 kg)  01/10/14 188 lb 6.4 oz (85.458 kg)      Vital signs reviewed - Note on arrival 02 sats  100% on RA       HEENT: nl dentition, turbinates bilaterally, and oropharynx. Nl external ear canals without cough reflex   NECK :  without JVD/Nodes/TM/ nl carotid upstrokes bilaterally   LUNGS: no acc muscle use,  Nl contour chest which is clear to A and P bilaterally without cough on insp or exp maneuvers   CV:  RRR  no s3   II/VI SEM  - no increase in P2, and no edema   ABD:  soft and nontender with nl inspiratory excursion in the supine position. No bruits or organomegaly appreciated, bowel sounds nl  MS:  Nl gait/ ext warm without deformities, calf tenderness, cyanosis or clubbing No obvious joint restrictions   SKIN: warm and dry without lesions    NEURO:  alert, approp, nl sensorium with  no motor or cerebellar deficits apparent.                      Assessment & Plan:

## 2017-07-17 NOTE — Patient Instructions (Addendum)
Keep up the good work with exercise!    If you are satisfied with your treatment plan,  let your doctor know and he/she can either refill your medications or you can return here when your prescription runs out.     If in any way you are not 100% satisfied,  please tell us.  If 100% better, tell your friends!  Pulmonary follow up is as needed

## 2017-07-19 ENCOUNTER — Encounter: Payer: Self-pay | Admitting: Internal Medicine

## 2017-07-19 NOTE — Assessment & Plan Note (Signed)
-   CT head 04/20/14 Diffuse mucoperiosteal thickening and partial opacification of the visualized ethmoid air cells. Small air-fluid level in the right sphenoid air cell. Findings suggest inflammatory paranasal sinus disease.   - spirometry wnl 09/30/2014 during flare of cough  - allergy profile 10/16/14 >  Eos 0.3, IgE  18 with neg RAST  Adequate control on present rx, reviewed in detail with pt > no change in rx needed

## 2017-07-19 NOTE — Assessment & Plan Note (Signed)
PFTs in 03/2009 were essentially nml, no obstruction, nml lung volumes & diffusion - improved symptoms p stopped losartan 04/01/14 - 04/28/2014 p extensive coaching HFA effectiveness =    90%  - symptoms improved off all rx 10/16/14 so rec prn saba only  - symptoms flared over the summer of 2016 so 03/09/2015  >> rec restart dulera 100 2 every 12h  - flared of symbicort 08/01/2016  With FEN0 = 48 and spirometry uninterpretable >  try 80 2bid> not clear she took it/ whether or not responded      - 09/15/2016  Active wheeze >> After extensive coaching HFA effectiveness =    75% > rechallenge with symb - 01/16/2017 again d/c'd symb but doing well x months so rec use "prn"  All goals of chronic asthma control met including optimal function and elimination of symptoms with minimal need for rescue therapy.  Contingencies discussed in full including contacting this office immediately if not controlling the symptoms using the rule of two's.     F/u is prn

## 2017-07-20 ENCOUNTER — Other Ambulatory Visit: Payer: PPO | Admitting: *Deleted

## 2017-07-20 DIAGNOSIS — I25118 Atherosclerotic heart disease of native coronary artery with other forms of angina pectoris: Secondary | ICD-10-CM | POA: Diagnosis not present

## 2017-07-20 LAB — LIPID PANEL
Chol/HDL Ratio: 2.4 ratio (ref 0.0–4.4)
Cholesterol, Total: 131 mg/dL (ref 100–199)
HDL: 55 mg/dL (ref >39)
LDL Calculated: 63 mg/dL (ref 0–99)
Triglycerides: 65 mg/dL (ref 0–149)
VLDL Cholesterol Cal: 13 mg/dL (ref 5–40)

## 2017-07-20 LAB — ALT: ALT: 11 IU/L (ref 0–32)

## 2017-07-24 ENCOUNTER — Other Ambulatory Visit: Payer: Self-pay | Admitting: Internal Medicine

## 2017-07-24 NOTE — Telephone Encounter (Signed)
Refill Request.  

## 2017-08-16 DIAGNOSIS — H348312 Tributary (branch) retinal vein occlusion, right eye, stable: Secondary | ICD-10-CM | POA: Diagnosis not present

## 2017-08-16 DIAGNOSIS — H11152 Pinguecula, left eye: Secondary | ICD-10-CM | POA: Diagnosis not present

## 2017-08-16 DIAGNOSIS — H25813 Combined forms of age-related cataract, bilateral: Secondary | ICD-10-CM | POA: Diagnosis not present

## 2017-08-17 DIAGNOSIS — H35351 Cystoid macular degeneration, right eye: Secondary | ICD-10-CM | POA: Diagnosis not present

## 2017-08-17 DIAGNOSIS — H34811 Central retinal vein occlusion, right eye, with macular edema: Secondary | ICD-10-CM | POA: Diagnosis not present

## 2017-08-17 DIAGNOSIS — H35041 Retinal micro-aneurysms, unspecified, right eye: Secondary | ICD-10-CM | POA: Diagnosis not present

## 2017-08-17 DIAGNOSIS — H2511 Age-related nuclear cataract, right eye: Secondary | ICD-10-CM | POA: Diagnosis not present

## 2017-08-17 DIAGNOSIS — H3561 Retinal hemorrhage, right eye: Secondary | ICD-10-CM | POA: Diagnosis not present

## 2017-08-17 DIAGNOSIS — H2512 Age-related nuclear cataract, left eye: Secondary | ICD-10-CM | POA: Diagnosis not present

## 2017-08-23 DIAGNOSIS — H34811 Central retinal vein occlusion, right eye, with macular edema: Secondary | ICD-10-CM | POA: Diagnosis not present

## 2017-08-23 DIAGNOSIS — H35041 Retinal micro-aneurysms, unspecified, right eye: Secondary | ICD-10-CM | POA: Diagnosis not present

## 2017-08-23 DIAGNOSIS — H2511 Age-related nuclear cataract, right eye: Secondary | ICD-10-CM | POA: Diagnosis not present

## 2017-08-23 DIAGNOSIS — H2512 Age-related nuclear cataract, left eye: Secondary | ICD-10-CM | POA: Diagnosis not present

## 2017-09-06 DIAGNOSIS — M25551 Pain in right hip: Secondary | ICD-10-CM | POA: Diagnosis not present

## 2017-09-06 DIAGNOSIS — M542 Cervicalgia: Secondary | ICD-10-CM | POA: Diagnosis not present

## 2017-09-06 DIAGNOSIS — M48061 Spinal stenosis, lumbar region without neurogenic claudication: Secondary | ICD-10-CM | POA: Diagnosis not present

## 2017-09-15 DIAGNOSIS — H35041 Retinal micro-aneurysms, unspecified, right eye: Secondary | ICD-10-CM | POA: Diagnosis not present

## 2017-09-15 DIAGNOSIS — H3561 Retinal hemorrhage, right eye: Secondary | ICD-10-CM | POA: Diagnosis not present

## 2017-09-15 DIAGNOSIS — H35351 Cystoid macular degeneration, right eye: Secondary | ICD-10-CM | POA: Diagnosis not present

## 2017-09-15 DIAGNOSIS — H34811 Central retinal vein occlusion, right eye, with macular edema: Secondary | ICD-10-CM | POA: Diagnosis not present

## 2017-10-04 DIAGNOSIS — M48061 Spinal stenosis, lumbar region without neurogenic claudication: Secondary | ICD-10-CM | POA: Diagnosis not present

## 2017-10-06 ENCOUNTER — Other Ambulatory Visit: Payer: Self-pay | Admitting: Internal Medicine

## 2017-10-06 DIAGNOSIS — Z1231 Encounter for screening mammogram for malignant neoplasm of breast: Secondary | ICD-10-CM

## 2017-10-18 DIAGNOSIS — H34811 Central retinal vein occlusion, right eye, with macular edema: Secondary | ICD-10-CM | POA: Diagnosis not present

## 2017-10-18 DIAGNOSIS — H35041 Retinal micro-aneurysms, unspecified, right eye: Secondary | ICD-10-CM | POA: Diagnosis not present

## 2017-10-18 DIAGNOSIS — H3561 Retinal hemorrhage, right eye: Secondary | ICD-10-CM | POA: Diagnosis not present

## 2017-10-18 DIAGNOSIS — H35351 Cystoid macular degeneration, right eye: Secondary | ICD-10-CM | POA: Diagnosis not present

## 2017-10-31 ENCOUNTER — Ambulatory Visit
Admission: RE | Admit: 2017-10-31 | Discharge: 2017-10-31 | Disposition: A | Discharge status: Home / Self Care | Payer: PPO | Source: Ambulatory Visit | Attending: Internal Medicine | Admitting: Internal Medicine

## 2017-10-31 DIAGNOSIS — Z1231 Encounter for screening mammogram for malignant neoplasm of breast: Secondary | ICD-10-CM | POA: Diagnosis not present

## 2017-11-01 ENCOUNTER — Other Ambulatory Visit (HOSPITAL_COMMUNITY): Payer: Self-pay | Admitting: Orthopedic Surgery

## 2017-11-01 ENCOUNTER — Ambulatory Visit (HOSPITAL_COMMUNITY)
Admission: RE | Admit: 2017-11-01 | Discharge: 2017-11-01 | Disposition: A | Discharge status: Home / Self Care | Payer: PPO | Source: Ambulatory Visit | Attending: Cardiology | Admitting: Cardiology

## 2017-11-01 DIAGNOSIS — M25572 Pain in left ankle and joints of left foot: Secondary | ICD-10-CM | POA: Diagnosis not present

## 2017-11-01 DIAGNOSIS — M79662 Pain in left lower leg: Secondary | ICD-10-CM | POA: Diagnosis not present

## 2017-11-01 DIAGNOSIS — M7989 Other specified soft tissue disorders: Secondary | ICD-10-CM

## 2017-11-07 ENCOUNTER — Telehealth: Payer: Self-pay | Admitting: Internal Medicine

## 2017-11-07 NOTE — Telephone Encounter (Signed)
New message:       Pt c/o swelling: STAT is pt has developed SOB within 24 hours  1) How much weight have you gained and in what time span? No  2) If swelling, where is the swelling located? feet  3) Are you currently taking a fluid pill?Yes   4) Are you currently SOB? No   5) Do you have a log of your daily weights (if so, list)? No  6) Have you gained 3 pounds in a day or 5 pounds in a week? No  7) Have you traveled recently? No    Pt states her swelling has been going on for over a week no. Pt states she has taken a fluid pill but it's not helping with the swelling at all.

## 2017-11-07 NOTE — Telephone Encounter (Signed)
Pt states for over a week now she has been having trouble with swelling in her left foot/ankle/toes.  Denies pitting edema, redness, or heat.  Weighed while we were on the phone and she is at 180lbs.  Denies SOB and  CP.  Propped feet up last night on 2 pillows and swelling resolved by morning.  States she has been up moving around less than an hour and swelling has returned.  Pt does have compression stockings.  Advised her to put those on for now and I will send message to Dr. Okey Dupre for review and advisement. Pt verbalized understanding.  FYI- pt had LEV doppler 5/29 that was negative for DVT.

## 2017-11-08 NOTE — Telephone Encounter (Signed)
Spoke with the patient and gave her Dr Serita Kyle recommendations.  She will try them and call us in 1 week to give [place] an update.

## 2017-11-08 NOTE — Telephone Encounter (Signed)
Please have [name] [name] continue to elevate her legs when possible and use compression stockings when she will be on her feet.  She should also minimize her salt intake.  If swelling does not resolve in the next week or she has other symptoms such as shortness of breath, chest pain, or lightheadedness, she should be seen by me or an APP shortly thereafter.  [name], MD [facility] Pager: [phone]

## 2017-11-10 DIAGNOSIS — R600 Localized edema: Secondary | ICD-10-CM | POA: Diagnosis not present

## 2017-11-10 DIAGNOSIS — L309 Dermatitis, unspecified: Secondary | ICD-10-CM | POA: Diagnosis not present

## 2017-11-10 DIAGNOSIS — Z6834 Body mass index (BMI) 34.0-34.9, adult: Secondary | ICD-10-CM | POA: Diagnosis not present

## 2017-11-10 DIAGNOSIS — I10 Essential (primary) hypertension: Secondary | ICD-10-CM | POA: Diagnosis not present

## 2017-11-10 DIAGNOSIS — I872 Venous insufficiency (chronic) (peripheral): Secondary | ICD-10-CM | POA: Diagnosis not present

## 2017-11-10 DIAGNOSIS — K219 Gastro-esophageal reflux disease without esophagitis: Secondary | ICD-10-CM | POA: Diagnosis not present

## 2017-11-10 DIAGNOSIS — R0600 Dyspnea, unspecified: Secondary | ICD-10-CM | POA: Diagnosis not present

## 2017-11-14 ENCOUNTER — Telehealth: Payer: Self-pay | Admitting: Internal Medicine

## 2017-11-14 ENCOUNTER — Other Ambulatory Visit: Payer: Self-pay | Admitting: Internal Medicine

## 2017-11-14 NOTE — Telephone Encounter (Signed)
[facility] on [address] stating that pt's medication of Irbesartan (Avapro) 300 mg tablet is unavailable. Pharmacy can be reached at [phone]. Please address

## 2017-11-14 NOTE — Telephone Encounter (Signed)
Refill Request.  

## 2017-11-15 ENCOUNTER — Other Ambulatory Visit: Payer: Self-pay

## 2017-11-15 MED ORDER — TELMISARTAN 80 MG PO TABS: 80.0000 mg | ORAL_TABLET | Freq: Every day | ORAL | 1 refills | Status: DC

## 2017-11-15 NOTE — Telephone Encounter (Signed)
Spoke with [facility] because the patient is requesting a refill of her Irbesartan, completely out of medication.  The pharmacy said that the medication is unavailable with no specific release date.  Please advise, thank you.

## 2017-11-15 NOTE — Telephone Encounter (Signed)
Spoke with patient and suggested the Losartan, but she said Dr Shirlee Latch discontinued that in the past because it caused her an itching and leg pain reaction.  We will try the Telmisartan and see how it works out.  She verbalized understanding.

## 2017-11-15 NOTE — Telephone Encounter (Signed)
Let's have her switch to losartan 100 mg daily.  If that is also unavailable, we can try telmisartan 80 mg daily. Thanks.  [name], MD [facility] Pager: [phone]

## 2017-11-22 ENCOUNTER — Other Ambulatory Visit: Payer: Self-pay | Admitting: Internal Medicine

## 2017-11-22 DIAGNOSIS — M48061 Spinal stenosis, lumbar region without neurogenic claudication: Secondary | ICD-10-CM | POA: Diagnosis not present

## 2017-11-22 DIAGNOSIS — M25572 Pain in left ankle and joints of left foot: Secondary | ICD-10-CM | POA: Diagnosis not present

## 2017-11-22 MED ORDER — TELMISARTAN 80 MG PO TABS: 80.0000 mg | ORAL_TABLET | Freq: Every day | ORAL | 2 refills | Status: DC

## 2017-11-22 NOTE — Addendum Note (Signed)
Addended by: Demetrios Loll on: 11/22/2017 11:11 AM   Modules accepted: Orders

## 2017-11-22 NOTE — Telephone Encounter (Signed)
Refill Request.  

## 2017-11-27 DIAGNOSIS — E7849 Other hyperlipidemia: Secondary | ICD-10-CM | POA: Diagnosis not present

## 2017-11-27 DIAGNOSIS — R82998 Other abnormal findings in urine: Secondary | ICD-10-CM | POA: Diagnosis not present

## 2017-11-27 DIAGNOSIS — I10 Essential (primary) hypertension: Secondary | ICD-10-CM | POA: Diagnosis not present

## 2017-11-27 DIAGNOSIS — E559 Vitamin D deficiency, unspecified: Secondary | ICD-10-CM | POA: Diagnosis not present

## 2017-11-29 DIAGNOSIS — H35041 Retinal micro-aneurysms, unspecified, right eye: Secondary | ICD-10-CM | POA: Diagnosis not present

## 2017-11-29 DIAGNOSIS — H3561 Retinal hemorrhage, right eye: Secondary | ICD-10-CM | POA: Diagnosis not present

## 2017-11-29 DIAGNOSIS — H35351 Cystoid macular degeneration, right eye: Secondary | ICD-10-CM | POA: Diagnosis not present

## 2017-11-29 DIAGNOSIS — H34811 Central retinal vein occlusion, right eye, with macular edema: Secondary | ICD-10-CM | POA: Diagnosis not present

## 2017-12-04 DIAGNOSIS — G4733 Obstructive sleep apnea (adult) (pediatric): Secondary | ICD-10-CM | POA: Diagnosis not present

## 2017-12-04 DIAGNOSIS — E559 Vitamin D deficiency, unspecified: Secondary | ICD-10-CM | POA: Diagnosis not present

## 2017-12-04 DIAGNOSIS — M069 Rheumatoid arthritis, unspecified: Secondary | ICD-10-CM | POA: Diagnosis not present

## 2017-12-04 DIAGNOSIS — Z1389 Encounter for screening for other disorder: Secondary | ICD-10-CM | POA: Diagnosis not present

## 2017-12-04 DIAGNOSIS — I2510 Atherosclerotic heart disease of native coronary artery without angina pectoris: Secondary | ICD-10-CM | POA: Diagnosis not present

## 2017-12-04 DIAGNOSIS — L298 Other pruritus: Secondary | ICD-10-CM | POA: Diagnosis not present

## 2017-12-04 DIAGNOSIS — R0609 Other forms of dyspnea: Secondary | ICD-10-CM | POA: Diagnosis not present

## 2017-12-04 DIAGNOSIS — Z0000 Encounter for general adult medical examination without abnormal findings: Secondary | ICD-10-CM | POA: Diagnosis not present

## 2017-12-04 DIAGNOSIS — I10 Essential (primary) hypertension: Secondary | ICD-10-CM | POA: Diagnosis not present

## 2017-12-04 DIAGNOSIS — E7849 Other hyperlipidemia: Secondary | ICD-10-CM | POA: Diagnosis not present

## 2017-12-04 DIAGNOSIS — M859 Disorder of bone density and structure, unspecified: Secondary | ICD-10-CM | POA: Diagnosis not present

## 2017-12-04 DIAGNOSIS — Z6833 Body mass index (BMI) 33.0-33.9, adult: Secondary | ICD-10-CM | POA: Diagnosis not present

## 2017-12-23 ENCOUNTER — Emergency Department (HOSPITAL_COMMUNITY): Payer: PPO

## 2017-12-23 ENCOUNTER — Encounter (HOSPITAL_COMMUNITY): Payer: Self-pay | Admitting: Emergency Medicine

## 2017-12-23 ENCOUNTER — Emergency Department (HOSPITAL_COMMUNITY)
Admission: EM | Admit: 2017-12-23 | Discharge: 2017-12-23 | Disposition: A | Discharge status: Home / Self Care | Payer: PPO | Attending: Emergency Medicine | Admitting: Emergency Medicine

## 2017-12-23 DIAGNOSIS — S4992XA Unspecified injury of left shoulder and upper arm, initial encounter: Secondary | ICD-10-CM | POA: Diagnosis not present

## 2017-12-23 DIAGNOSIS — S8002XA Contusion of left knee, initial encounter: Secondary | ICD-10-CM | POA: Diagnosis not present

## 2017-12-23 DIAGNOSIS — S0990XA Unspecified injury of head, initial encounter: Secondary | ICD-10-CM | POA: Diagnosis not present

## 2017-12-23 DIAGNOSIS — S62317A Displaced fracture of base of fifth metacarpal bone. left hand, initial encounter for closed fracture: Secondary | ICD-10-CM | POA: Diagnosis not present

## 2017-12-23 DIAGNOSIS — S0993XA Unspecified injury of face, initial encounter: Secondary | ICD-10-CM | POA: Diagnosis not present

## 2017-12-23 DIAGNOSIS — S6292XA Unspecified fracture of left wrist and hand, initial encounter for closed fracture: Secondary | ICD-10-CM

## 2017-12-23 DIAGNOSIS — Z79899 Other long term (current) drug therapy: Secondary | ICD-10-CM | POA: Diagnosis not present

## 2017-12-23 DIAGNOSIS — R51 Headache: Secondary | ICD-10-CM | POA: Diagnosis not present

## 2017-12-23 DIAGNOSIS — S0083XA Contusion of other part of head, initial encounter: Secondary | ICD-10-CM | POA: Insufficient documentation

## 2017-12-23 DIAGNOSIS — S62357A Nondisplaced fracture of shaft of fifth metacarpal bone, left hand, initial encounter for closed fracture: Secondary | ICD-10-CM | POA: Diagnosis not present

## 2017-12-23 DIAGNOSIS — Z7901 Long term (current) use of anticoagulants: Secondary | ICD-10-CM | POA: Diagnosis not present

## 2017-12-23 DIAGNOSIS — R609 Edema, unspecified: Secondary | ICD-10-CM | POA: Diagnosis not present

## 2017-12-23 DIAGNOSIS — I499 Cardiac arrhythmia, unspecified: Secondary | ICD-10-CM | POA: Diagnosis not present

## 2017-12-23 DIAGNOSIS — I2510 Atherosclerotic heart disease of native coronary artery without angina pectoris: Secondary | ICD-10-CM | POA: Insufficient documentation

## 2017-12-23 DIAGNOSIS — S40012A Contusion of left shoulder, initial encounter: Secondary | ICD-10-CM | POA: Insufficient documentation

## 2017-12-23 DIAGNOSIS — M25512 Pain in left shoulder: Secondary | ICD-10-CM | POA: Diagnosis not present

## 2017-12-23 DIAGNOSIS — Y929 Unspecified place or not applicable: Secondary | ICD-10-CM | POA: Diagnosis not present

## 2017-12-23 DIAGNOSIS — S161XXA Strain of muscle, fascia and tendon at neck level, initial encounter: Secondary | ICD-10-CM | POA: Diagnosis not present

## 2017-12-23 DIAGNOSIS — W010XXA Fall on same level from slipping, tripping and stumbling without subsequent striking against object, initial encounter: Secondary | ICD-10-CM | POA: Insufficient documentation

## 2017-12-23 DIAGNOSIS — T07XXXA Unspecified multiple injuries, initial encounter: Secondary | ICD-10-CM | POA: Diagnosis not present

## 2017-12-23 DIAGNOSIS — Y999 Unspecified external cause status: Secondary | ICD-10-CM | POA: Insufficient documentation

## 2017-12-23 DIAGNOSIS — M25552 Pain in left hip: Secondary | ICD-10-CM | POA: Diagnosis not present

## 2017-12-23 DIAGNOSIS — Y939 Activity, unspecified: Secondary | ICD-10-CM | POA: Insufficient documentation

## 2017-12-23 DIAGNOSIS — W19XXXA Unspecified fall, initial encounter: Secondary | ICD-10-CM

## 2017-12-23 DIAGNOSIS — I10 Essential (primary) hypertension: Secondary | ICD-10-CM | POA: Diagnosis not present

## 2017-12-23 DIAGNOSIS — S62306A Unspecified fracture of fifth metacarpal bone, right hand, initial encounter for closed fracture: Secondary | ICD-10-CM | POA: Diagnosis not present

## 2017-12-23 DIAGNOSIS — S8992XA Unspecified injury of left lower leg, initial encounter: Secondary | ICD-10-CM | POA: Diagnosis not present

## 2017-12-23 DIAGNOSIS — S79912A Unspecified injury of left hip, initial encounter: Secondary | ICD-10-CM | POA: Diagnosis not present

## 2017-12-23 DIAGNOSIS — R52 Pain, unspecified: Secondary | ICD-10-CM | POA: Diagnosis not present

## 2017-12-23 DIAGNOSIS — S199XXA Unspecified injury of neck, initial encounter: Secondary | ICD-10-CM | POA: Diagnosis not present

## 2017-12-23 MED ORDER — ACETAMINOPHEN 500 MG PO TABS: 1000.0000 mg | ORAL_TABLET | Freq: Four times a day (QID) | ORAL | 0 refills | Status: DC | PRN

## 2017-12-23 MED ORDER — ACETAMINOPHEN 500 MG PO TABS
1000.0000 mg | ORAL_TABLET | Freq: Once | ORAL | Status: AC
  Administered 2017-12-23: 1000 mg via ORAL
  Filled 2017-12-23: qty 2

## 2017-12-23 NOTE — ED Notes (Signed)
This RN went in to check on patient per son's request.  Patient c/o c-collar being uncomfortable.  This RN confirmed space for 1 finger available, patient denies difficulty breathing or swallowing.  This RN explained to patient and family the benefits of collar, and the risk of removing before confirming neck safety.  This RN states provider to assess.  Updated on delays.

## 2017-12-23 NOTE — ED Notes (Signed)
Family at bedside. 

## 2017-12-23 NOTE — ED Provider Notes (Signed)
[facility] EMERGENCY DEPARTMENT Provider Note   CSN: [id number] Arrival date & time: 12/23/17  1402     History   Chief Complaint Chief Complaint  Patient presents with  . Fall    HPI [name] [name] is a 82 y.o. female.  HPI Patient reports that she was out walking at the park.  She got distracted by somebody that seemed to be trying to open her car tripped over a large hump in the ground.  She reports she fell onto her left side hitting her face and she has pain around her left shoulder.  She reports her left hand also hurts as well as her left hip and knee.  He denies that she was knocked out.  No confusion.  No vomiting.  No chest pain.  No shortness of breath.  No abdominal pain.  Patient takes Plavix. Past Medical History:  Diagnosis Date  . Arthritis   . CAD (coronary artery disease)    a. 02/2008 Cath: EF 60%, RCA 100 w/ L->R collats, LM 20d, RI 60-70, small, LAD 60/86m, 99d, D1 50, OM1 50. Poor distal targets for CABG/no good interventional target -> medical Rx; b. Myoview in [place] in 3/11 -nl;  c. 07/2011 MV: EF 67%, mild inf ischemia->Med Rx; 02/2015 MV: EF 50%, small, mod intensity apical and apical inf rev defect-->Med Rx.  . Chronic cough    hx; ACEI stopped, possible contribution from GERD  . Depression   . Dyspnea    a. 08/2013 Echo: EF 60-65%, mild AS;  b. PFTs (10/10): FVC 103%, ratio 74%. mild obstruction; c. CT chest (6/11) no evidence for interstitial lung dz. possible asthma/upper airway cough sydrome. aspirin & beta blockers- potential causes for bronchospasm;  . GERD (gastroesophageal reflux disease)   . HBV (hepatitis B virus) infection   . HCV (hepatitis C virus)   . History of PAC's    hx  . History of PVC's    hx  . HLD (hyperlipidemia)    unable to tolerate most statins due to myalgias and elevated CK. thinks pravastatin casued a rash. Only tolerates crestor.  . Hypertensive heart disease   . IBS (irritable bowel syndrome)   .  Obesity   . Osteoarthritis    knee; s/p TKR  . Osteoporosis   . Pancreatitis    hx  . PUD (peptic ulcer disease)   . Sleep apnea    diagnosed in 2011 , did not want CPAP -     Patient Active Problem List   Diagnosis Date Noted  . Mild aortic stenosis 07/13/2017  . Pneumonia due to organism 07/04/2016  . Hypertensive heart disease   . CAD (coronary artery disease)   . Hyperlipidemia LDL goal <70   . Morbid obesity due to excess calories (HCC)   . Dyspnea 12/23/2013  . Sleep apnea   . PVC (premature ventricular contraction)   . Rheumatoid arthritis(714.0) 12/29/2011  . Shoulder pain 08/29/2011  . Cough variant asthma vs UACS with VCD  12/10/2009  . Upper airway cough syndrome 02/13/2009  . MURMUR 08/21/2008  . CAD, NATIVE VESSEL 05/19/2008  . KNEE PAIN 05/26/2007  . HEPATITIS C 02/06/2007  . Hyperlipidemia 02/06/2007  . DEPRESSION 02/06/2007  . Essential hypertension 02/06/2007  . IRRITABLE BOWEL SYNDROME 02/06/2007  . OSTEOPOROSIS 02/06/2007  . PANCREATITIS, HX OF 02/06/2007  . ANXIETY 12/22/2006  . GERD 12/22/2006  . HEPATITIS B, HX OF 12/22/2006    Past Surgical History:  Procedure Laterality Date  .  BREAST EXCISIONAL BIOPSY Right 1990  . calcification of right breast-lumps Right 1998  . CARDIAC CATHETERIZATION  2009  . CHOLECYSTECTOMY    . hyesterectomy, unspec. area  1998  . pancreatic duct stent    . TUBAL LIGATION  1995      OB History   None      Home Medications    Prior to Admission medications   Medication Sig Start Date End Date Taking? Authorizing Provider  acetaminophen (TYLENOL) 325 MG tablet Per bottle as needed    [provider]  acetaminophen (TYLENOL) 500 MG tablet Take 2 tablets (1,000 mg total) by mouth every 6 (six) hours as needed. 12/23/17   Arby Barrette, MD  albuterol (PROAIR HFA) 108 (90 Base) MCG/ACT inhaler Inhale 2 puffs into the lungs every 4 (four) hours as needed for wheezing or shortness of breath. 10/14/16    Parrett, Virgel Bouquet, NP  albuterol (PROVENTIL) (2.5 MG/3ML) 0.083% nebulizer solution Take 3 mLs (2.5 mg total) by nebulization every 6 (six) hours as needed for wheezing or shortness of breath. 05/31/16   Nyoka Cowden, MD  budesonide-formoterol (SYMBICORT) 80-4.5 MCG/ACT inhaler Inhale 2 puffs into the lungs 2 (two) times daily. 01/16/17   Nyoka Cowden, MD  clopidogrel (PLAVIX) 75 MG tablet TAKE 1 TABLET BY MOUTH EVERY DAY 11/14/17   End, Cristal Deer, MD  ergocalciferol (VITAMIN D2) 50000 units capsule Take 50,000 Units by mouth once a week.    [provider]  famotidine (PEPCID) 20 MG tablet Take 20 mg by mouth at bedtime.    [provider]  hydrochlorothiazide (HYDRODIURIL) 25 MG tablet Take 1 tablet (25 mg total) by mouth daily. 08/01/16   End, Cristal Deer, MD  irbesartan (AVAPRO) 300 MG tablet TAKE 1 TABLET BY MOUTH EVERY DAY 07/24/17   End, Cristal Deer, MD  nitroGLYCERIN (NITROSTAT) 0.4 MG SL tablet Place 1 tablet (0.4 mg total) under the tongue every 5 (five) minutes as needed for chest pain. 07/13/17   End, Cristal Deer, MD  pantoprazole (PROTONIX) 40 MG tablet Take 40 mg by mouth daily. 11/02/16   [provider]  rosuvastatin (CRESTOR) 20 MG tablet TAKE 1 TABLET BY MOUTH EVERY DAY 05/18/17   End, Cristal Deer, MD  telmisartan (MICARDIS) 80 MG tablet Take 1 tablet (80 mg total) by mouth daily. 11/22/17   End, Cristal Deer, MD  atorvastatin (LIPITOR) 40 MG tablet Take 1 tablet (40 mg total) by mouth daily. 07/06/16 07/06/16  Laurey Morale, MD    Family History Family History  Problem Relation Age of Onset  . Asthma Mother   . Arthritis Mother        rheumatiod arthritis  . Stroke Mother   . Asthma Sister   . Stroke Sister   . COPD Sister   . Emphysema Sister   . Colon cancer Maternal Aunt   . Lung cancer Maternal Aunt   . Pancreatic cancer Brother     Social History Social History   Tobacco Use  . Smoking status: Never Smoker  . Smokeless tobacco:  Never Used  . Tobacco comment: 2nd hand exposure x40 years   Substance Use Topics  . Alcohol use: No  . Drug use: No     Allergies   Aspirin; Clarithromycin; Doxycycline; Pravastatin; Levaquin [levofloxacin in d5w]; and Penicillins   Review of Systems Review of Systems 10 Systems reviewed and are negative for acute change except as noted in the HPI.   Physical Exam Updated Vital Signs BP (!) 158/98  Pulse 65   Temp 98.7 F (37.1 C) (Oral)   Resp 18   Ht 5\' 5"  (1.651 m)   Wt 79.4 kg (175 lb)   SpO2 100%   BMI 29.12 kg/m   Physical Exam  Constitutional: She is oriented to person, place, and time. She appears well-developed and well-nourished.  Patient is mildly anxious but alert and appropriate.  GCS of 15.  No respiratory distress.  HENT:  Patient has minor contusion of the left upper lip.  Patient endorses tenderness on the left zygoma.  Normal range of motion of jaw.  Oropharynx widely patent.  Eyes: Pupils are equal, round, and reactive to light. EOM are normal.  Neck:  Endorses tenderness to palpation of the cervical spine.  Collar maintained until C-spine studies return.  Cardiovascular: Normal rate, regular rhythm, normal heart sounds and intact distal pulses.  Pulmonary/Chest: Effort normal and breath sounds normal. She exhibits no tenderness.  Abdominal: Soft. She exhibits no distension. There is no tenderness. There is no guarding.  Musculoskeletal:  Patient endorses tenderness to the anterior left shoulder.  Range of motion intact.  Neurovascularly intact.  Endorses tenderness to the fourth and fifth metacarpals left hand.  Mild to moderate swelling over the dorsum of the hand in this area.  Wrist and elbow normal.  Patient endorses tenderness to palpation over the lateral hip area, no visible significant hematomas, abrasions or soft tissue injuries.  Also endorses tenderness to palpation around the left knee.  Patient on mild effusion versus chronic swelling.   Patient has had knee replacement on the left previously.  Scar is well-healed.  There is no erythema or warmth to the knee.  No malalignment of the left lower extremity.  Neurovascularly intact.  Neurological: She is alert and oriented to person, place, and time. No cranial nerve deficit. She exhibits normal muscle tone. Coordination normal.  Skin: Skin is warm and dry.  Psychiatric: She has a normal mood and affect.     ED Treatments / Results  Labs (all labs ordered are listed, but only abnormal results are displayed) Labs Reviewed - No data to display  EKG None  Radiology Dg Knee 2 Views Left  Result Date: 12/23/2017 CLINICAL DATA:  Per EMS: Pt presents to ED for assessment after she fell forward walking over some cobblestone. Patient fell and landed onto her left side. Now complaining of left hip, shoulder, and knee pain. Hx of left knee replacement. Swel.*comment was truncated* EXAM: LEFT KNEE - 1-2 VIEW COMPARISON:  None. FINDINGS: LEFT knee prosthetic. No fracture of the proximal tibia or distal femur. Patella is normal. No joint effusion. IMPRESSION: No acute osseous abnormality. Electronically Signed   By: Genevive Bi M.D.   On: 12/23/2017 16:13   Ct Head Wo Contrast  Result Date: 12/23/2017 CLINICAL DATA:  Fall today with head/face injury with headache and facial pain. EXAM: CT HEAD WITHOUT CONTRAST CT MAXILLOFACIAL WITHOUT CONTRAST CT CERVICAL SPINE WITHOUT CONTRAST TECHNIQUE: Multidetector CT imaging of the head, cervical spine, and maxillofacial structures were performed using the standard protocol without intravenous contrast. Multiplanar CT image reconstructions of the cervical spine and maxillofacial structures were also generated. COMPARISON:  Head CT 04/20/2014 FINDINGS: CT HEAD FINDINGS Brain: Ventricles, cisterns and other CSF spaces are within normal. There is no mass, mass effect, shift of midline structures or acute hemorrhage. There is chronic ischemic microvascular  disease. Old lacunar infarct over the anterior limb left internal capsule. Vascular: No hyperdense vessel or unexpected calcification. Skull: Normal.  Negative for fracture or focal lesion. Other: None. CT MAXILLOFACIAL FINDINGS Osseous: No evidence of acute facial bone fracture. Degenerative change of the temporomandibular joints bilaterally. Orbits: Negative. No traumatic or inflammatory finding. Sinuses: Clear. Soft tissues: Negative. CT CERVICAL SPINE FINDINGS Alignment: Normal. Skull base and vertebrae: Moderate spondylosis throughout the cervical spine. Atlantoaxial articulation is within normal. Moderate uncovertebral joint spurring and mild facet arthropathy. No evidence of acute fracture or subluxation. Moderate bilateral neural foraminal narrowing at multiple levels due to adjacent bony spurring. Soft tissues and spinal canal: No prevertebral fluid or swelling. No visible canal hematoma. Disc levels: Mild disc space narrowing from the C2-3 level to the C6-7 level. Upper chest: Negative. Other: None. IMPRESSION: No acute brain injury. Chronic ischemic microvascular disease. No acute facial bone fracture. No acute cervical spine injury. Mild spondylosis throughout the cervical spine with mild multilevel disc disease and moderate bilateral multilevel neural foraminal narrowing. Electronically Signed   By: Elberta Fortis M.D.   On: 12/23/2017 16:36   Ct Cervical Spine Wo Contrast  Result Date: 12/23/2017 CLINICAL DATA:  Fall today with head/face injury with headache and facial pain. EXAM: CT HEAD WITHOUT CONTRAST CT MAXILLOFACIAL WITHOUT CONTRAST CT CERVICAL SPINE WITHOUT CONTRAST TECHNIQUE: Multidetector CT imaging of the head, cervical spine, and maxillofacial structures were performed using the standard protocol without intravenous contrast. Multiplanar CT image reconstructions of the cervical spine and maxillofacial structures were also generated. COMPARISON:  Head CT 04/20/2014 FINDINGS: CT HEAD  FINDINGS Brain: Ventricles, cisterns and other CSF spaces are within normal. There is no mass, mass effect, shift of midline structures or acute hemorrhage. There is chronic ischemic microvascular disease. Old lacunar infarct over the anterior limb left internal capsule. Vascular: No hyperdense vessel or unexpected calcification. Skull: Normal. Negative for fracture or focal lesion. Other: None. CT MAXILLOFACIAL FINDINGS Osseous: No evidence of acute facial bone fracture. Degenerative change of the temporomandibular joints bilaterally. Orbits: Negative. No traumatic or inflammatory finding. Sinuses: Clear. Soft tissues: Negative. CT CERVICAL SPINE FINDINGS Alignment: Normal. Skull base and vertebrae: Moderate spondylosis throughout the cervical spine. Atlantoaxial articulation is within normal. Moderate uncovertebral joint spurring and mild facet arthropathy. No evidence of acute fracture or subluxation. Moderate bilateral neural foraminal narrowing at multiple levels due to adjacent bony spurring. Soft tissues and spinal canal: No prevertebral fluid or swelling. No visible canal hematoma. Disc levels: Mild disc space narrowing from the C2-3 level to the C6-7 level. Upper chest: Negative. Other: None. IMPRESSION: No acute brain injury. Chronic ischemic microvascular disease. No acute facial bone fracture. No acute cervical spine injury. Mild spondylosis throughout the cervical spine with mild multilevel disc disease and moderate bilateral multilevel neural foraminal narrowing. Electronically Signed   By: Elberta Fortis M.D.   On: 12/23/2017 16:36   Dg Shoulder Left  Result Date: 12/23/2017 CLINICAL DATA:  Pain following fall EXAM: LEFT SHOULDER - 2+ VIEW COMPARISON:  None. FINDINGS: Frontal and Y scapular images obtained. No fracture or dislocation. There is moderate generalized osteoarthritic change. No erosive change. Visualized left lung is clear. IMPRESSION: No fracture or dislocation. There is moderate  generalized osteoarthritic change. Electronically Signed   By: Bretta Bang III M.D.   On: 12/23/2017 16:11   Dg Hand Complete Left  Result Date: 12/23/2017 CLINICAL DATA:  Pain following fall EXAM: LEFT HAND - COMPLETE 3+ VIEW COMPARISON:  None. FINDINGS: Frontal, oblique, and lateral views were obtained. There is a subtle transverse lucency in the proximal fifth metacarpal with a small area of questionable  avulsion along the lateral aspect of the proximal fifth metacarpal. Suspect nondisplaced fracture in this area. No other fracture appreciable. No dislocation. There is narrowing of the first MCP joint as well as all PIP and DIP joints. There is advanced arthropathy in the first carpal-metacarpal joint with remodeling of the trapezium. There is moderate osteoarthritic change in the scaphotrapezial joint. No erosive change. IMPRESSION: Evidence of subtle fracture of the proximal fifth metacarpal. No other fracture evident. Multilevel arthropathy, most marked in the first carpal-metacarpal joint. Electronically Signed   By: Bretta Bang III M.D.   On: 12/23/2017 16:14   Dg Hip Unilat With Pelvis 2-3 Views Left  Result Date: 12/23/2017 CLINICAL DATA:  Fall today.  Left hip pain.  Initial encounter. EXAM: DG HIP (WITH OR WITHOUT PELVIS) 2-3V LEFT COMPARISON:  None. FINDINGS: There is no evidence of hip fracture or dislocation. There is no evidence of arthropathy or other focal bone abnormality. IMPRESSION: Negative. Electronically Signed   By: Myles Rosenthal M.D.   On: 12/23/2017 16:12   Ct Maxillofacial Wo Cm  Result Date: 12/23/2017 CLINICAL DATA:  Fall today with head/face injury with headache and facial pain. EXAM: CT HEAD WITHOUT CONTRAST CT MAXILLOFACIAL WITHOUT CONTRAST CT CERVICAL SPINE WITHOUT CONTRAST TECHNIQUE: Multidetector CT imaging of the head, cervical spine, and maxillofacial structures were performed using the standard protocol without intravenous contrast. Multiplanar CT image  reconstructions of the cervical spine and maxillofacial structures were also generated. COMPARISON:  Head CT 04/20/2014 FINDINGS: CT HEAD FINDINGS Brain: Ventricles, cisterns and other CSF spaces are within normal. There is no mass, mass effect, shift of midline structures or acute hemorrhage. There is chronic ischemic microvascular disease. Old lacunar infarct over the anterior limb left internal capsule. Vascular: No hyperdense vessel or unexpected calcification. Skull: Normal. Negative for fracture or focal lesion. Other: None. CT MAXILLOFACIAL FINDINGS Osseous: No evidence of acute facial bone fracture. Degenerative change of the temporomandibular joints bilaterally. Orbits: Negative. No traumatic or inflammatory finding. Sinuses: Clear. Soft tissues: Negative. CT CERVICAL SPINE FINDINGS Alignment: Normal. Skull base and vertebrae: Moderate spondylosis throughout the cervical spine. Atlantoaxial articulation is within normal. Moderate uncovertebral joint spurring and mild facet arthropathy. No evidence of acute fracture or subluxation. Moderate bilateral neural foraminal narrowing at multiple levels due to adjacent bony spurring. Soft tissues and spinal canal: No prevertebral fluid or swelling. No visible canal hematoma. Disc levels: Mild disc space narrowing from the C2-3 level to the C6-7 level. Upper chest: Negative. Other: None. IMPRESSION: No acute brain injury. Chronic ischemic microvascular disease. No acute facial bone fracture. No acute cervical spine injury. Mild spondylosis throughout the cervical spine with mild multilevel disc disease and moderate bilateral multilevel neural foraminal narrowing. Electronically Signed   By: Elberta Fortis M.D.   On: 12/23/2017 16:36    Procedures Procedures (including critical care time)  Medications Ordered in ED Medications  acetaminophen (TYLENOL) tablet 1,000 mg (1,000 mg Oral Given 12/23/17 1739)     Initial Impression / Assessment and Plan / ED Course   I have reviewed the triage vital signs and the nursing notes.  Pertinent labs & imaging results that were available during my care of the patient were reviewed by me and considered in my medical decision making (see chart for details).     Final Clinical Impressions(s) / ED Diagnoses   Final diagnoses:  Fall, initial encounter  Contusion of face, initial encounter  Cervical strain, acute, initial encounter  Contusion of left shoulder, initial encounter  Fractured  hand, left, closed, initial encounter  Contusion of left knee, initial encounter  Mechanical fall.  Multiple contusions and areas of discomfort on the left side of her body.  CT scan of head neck and face negative.  Minor nondisplaced fracture identified in the left hand.  Removable splint applied.  Is counseled on icing and elevating.  She wishes to take acetaminophen for pain control.  Follow-up recommendations given.  Return precautions reviewed.  ED Discharge Orders        Ordered    acetaminophen (TYLENOL) 500 MG tablet  Every 6 hours PRN     12/23/17 1742       Arby Barrette, MD 12/23/17 1755

## 2017-12-23 NOTE — ED Notes (Signed)
Patient transported to X-ray 

## 2017-12-23 NOTE — ED Notes (Signed)
Patient is alert and orientedx4.  Patient was explained discharge instructions and they understood them with no questions.  The patient's husband is taking her home. 

## 2017-12-23 NOTE — ED Triage Notes (Signed)
Per EMS:  Pt presents to ED for assessment after she fell forward walking over some cobblestone.  Patient fell to her left side, hitting her face.  PT is no plavix.  Patient tearful on arrival, states she is having a lot of anxiety because of the c-collar and she is scared.  Swelling noted to patient's right jaw, left wrist (pulse intact), left ankle.  Patient had a knee replacement on her left side, and c/o knee pain as well.  Pt states hx of HTN, but does not take medicine at this time.  183/103 BP en route.  AxOx4.  50 Fentanyl given en route IV

## 2017-12-23 NOTE — ED Notes (Signed)
Pt put on Purewick.   

## 2017-12-27 ENCOUNTER — Encounter: Payer: Self-pay | Admitting: *Deleted

## 2017-12-27 DIAGNOSIS — W0110XA Fall on same level from slipping, tripping and stumbling with subsequent striking against unspecified object, initial encounter: Secondary | ICD-10-CM | POA: Diagnosis not present

## 2017-12-27 DIAGNOSIS — S0083XA Contusion of other part of head, initial encounter: Secondary | ICD-10-CM | POA: Diagnosis not present

## 2017-12-27 DIAGNOSIS — Z6834 Body mass index (BMI) 34.0-34.9, adult: Secondary | ICD-10-CM | POA: Diagnosis not present

## 2017-12-27 DIAGNOSIS — S62308A Unspecified fracture of other metacarpal bone, initial encounter for closed fracture: Secondary | ICD-10-CM | POA: Diagnosis not present

## 2017-12-27 DIAGNOSIS — M542 Cervicalgia: Secondary | ICD-10-CM | POA: Diagnosis not present

## 2017-12-27 DIAGNOSIS — M25562 Pain in left knee: Secondary | ICD-10-CM | POA: Diagnosis not present

## 2017-12-28 DIAGNOSIS — S62347A Nondisplaced fracture of base of fifth metacarpal bone. left hand, initial encounter for closed fracture: Secondary | ICD-10-CM | POA: Diagnosis not present

## 2017-12-28 DIAGNOSIS — M79642 Pain in left hand: Secondary | ICD-10-CM | POA: Diagnosis not present

## 2018-01-11 DIAGNOSIS — S62347A Nondisplaced fracture of base of fifth metacarpal bone. left hand, initial encounter for closed fracture: Secondary | ICD-10-CM | POA: Diagnosis not present

## 2018-01-12 DIAGNOSIS — H3561 Retinal hemorrhage, right eye: Secondary | ICD-10-CM | POA: Diagnosis not present

## 2018-01-12 DIAGNOSIS — H34811 Central retinal vein occlusion, right eye, with macular edema: Secondary | ICD-10-CM | POA: Diagnosis not present

## 2018-01-12 DIAGNOSIS — H35041 Retinal micro-aneurysms, unspecified, right eye: Secondary | ICD-10-CM | POA: Diagnosis not present

## 2018-01-12 DIAGNOSIS — H35351 Cystoid macular degeneration, right eye: Secondary | ICD-10-CM | POA: Diagnosis not present

## 2018-01-18 ENCOUNTER — Encounter: Payer: Self-pay | Admitting: Internal Medicine

## 2018-01-18 ENCOUNTER — Ambulatory Visit: Payer: PPO | Admitting: Internal Medicine

## 2018-01-18 VITALS — BP 146/90 | HR 77 | Ht 65.0 in | Wt 180.8 lb

## 2018-01-18 DIAGNOSIS — I2510 Atherosclerotic heart disease of native coronary artery without angina pectoris: Secondary | ICD-10-CM

## 2018-01-18 DIAGNOSIS — E785 Hyperlipidemia, unspecified: Secondary | ICD-10-CM | POA: Diagnosis not present

## 2018-01-18 DIAGNOSIS — I350 Nonrheumatic aortic (valve) stenosis: Secondary | ICD-10-CM | POA: Diagnosis not present

## 2018-01-18 DIAGNOSIS — I10 Essential (primary) hypertension: Secondary | ICD-10-CM

## 2018-01-18 NOTE — Progress Notes (Signed)
Follow-up Outpatient Visit Date: 01/18/2018  Primary Care Provider: Jarome Matin (Inactive) [place]  Chief Complaint: Follow-up coronary artery disease  HPI:  [name] [name] is a 82 y.o. year-old female with history of multivessel CAD being managed medically due to poor PCI and CABG targets, hypertension, hyperlipidemia, PACs and PVCs, obstructive sleep apnea not on CPAP, cough variant asthma, hepatitis B and C, and GER, who presents for follow-up of stable angina.  I last saw her in February, at which time she was doing well with the exception of fatigue and a "floating" sensation while on isosorbide mononitrate.  She was seen in the ED last month due to a mechanical fall.  Nondisplaced fracture in the left hand was noted.  Otherwise, no significant trauma was identified.  Today, [name] [name] reports feeling well other than fatigue and achiness.  She wonders if this could be related to some of her medications.  She in particular, she is concerned about rosuvastatin, telmisartan, and pantoprazole.  She denies chest pain or shortness of breath, other than rare episodes of exertional dyspnea when she is under stress.  She only notices this every 3 to 4 months.  She reports being compliant with her medications.  She denies palpitations, lightheadedness, orthopnea, and PND.  She noted a few days of leg edema earlier this summer, which resolved spontaneously.  She is somewhat unclear about the circumstances, though it sounds like she may have been on a course of steroids at the time due to a rash.  --------------------------------------------------------------------------------------------------  Cardiovascular History & Procedures: Cardiovascular Problems:  Multivessel coronary artery disease  PACs and PVCs  Risk Factors:  Known coronary artery disease, hypertension, and age > 49  Cath/PCI:  LHC (02/20/08): LMCA with 20% distal stenosis. LAD with serial 70% and 60% lesions in the  midportion. Distal LAD is subtotally occluded. D1 with 50% stenosis. Ramus is small vessel with 60-70% mid stenosis. LCx with luminal irregularities. OM1 has 50% stenosis. Dominant RCA with proximal CTO. LVEF 60% with mid inferior hypokinesis. LVEDP 16 mmHg.  CV Surgery:  None  EP Procedures and Devices:  None  Non-Invasive Evaluation(s):  Pharmacologic myocardial perfusion stress test (02/19/15): Intermediate risk study with small in size, moderate in intensity reversible apical and apical inferior defect. Inferolateral wall is hypokinetic with LVEF 50%.  Recent CV Pertinent Labs: Lab Results  Component Value Date   CHOL 131 07/20/2017   HDL 55 07/20/2017   LDLCALC 63 07/20/2017   TRIG 65 07/20/2017   CHOLHDL 2.4 07/20/2017   CHOLHDL 2.0 06/16/2015   INR 0.93 07/04/2010   K 3.8 02/13/2017   BUN 20 02/13/2017   CREATININE 1.00 02/13/2017   CREATININE 0.86 01/28/2016    Past medical and surgical history were reviewed and updated in EPIC.  Current Meds  Medication Sig  . acetaminophen (TYLENOL) 500 MG tablet Take 2 tablets (1,000 mg total) by mouth every 6 (six) hours as needed.  Marland Kitchen albuterol (PROAIR HFA) 108 (90 Base) MCG/ACT inhaler Inhale 2 puffs into the lungs every 4 (four) hours as needed for wheezing or shortness of breath.  Marland Kitchen albuterol (PROVENTIL) (2.5 MG/3ML) 0.083% nebulizer solution Take 3 mLs (2.5 mg total) by nebulization every 6 (six) hours as needed for wheezing or shortness of breath.  . budesonide-formoterol (SYMBICORT) 80-4.5 MCG/ACT inhaler Inhale 2 puffs into the lungs 2 (two) times daily.  . clopidogrel (PLAVIX) 75 MG tablet TAKE 1 TABLET BY MOUTH EVERY DAY  . ergocalciferol (VITAMIN D2) 50000 units capsule Take 50,000 Units  by mouth once a week.  . famotidine (PEPCID) 20 MG tablet Take 20 mg by mouth at bedtime.  . hydrochlorothiazide (HYDRODIURIL) 25 MG tablet Take 1 tablet (25 mg total) by mouth daily.  . nitroGLYCERIN (NITROSTAT) 0.4 MG SL tablet  Place 1 tablet (0.4 mg total) under the tongue every 5 (five) minutes as needed for chest pain.  . pantoprazole (PROTONIX) 40 MG tablet Take 40 mg by mouth daily.  Marland Kitchen telmisartan (MICARDIS) 80 MG tablet Take 1 tablet (80 mg total) by mouth daily.  Marland Kitchen triamcinolone cream (KENALOG) 0.1 % APPLY TO BUMPY/ITCHY SKIN TWICE A DAY AS NEEDED FOR ITCHING  . [DISCONTINUED] irbesartan (AVAPRO) 300 MG tablet TAKE 1 TABLET BY MOUTH EVERY DAY  . [DISCONTINUED] rosuvastatin (CRESTOR) 20 MG tablet TAKE 1 TABLET BY MOUTH EVERY DAY    Allergies: Aspirin; Clarithromycin; Doxycycline; Pravastatin; Levaquin [levofloxacin in d5w]; and Penicillins  Social History   Tobacco Use  . Smoking status: Never Smoker  . Smokeless tobacco: Never Used  . Tobacco comment: 2nd hand exposure x40 years   Substance Use Topics  . Alcohol use: No  . Drug use: No    Family History  Problem Relation Age of Onset  . Asthma Mother   . Stroke Mother        massive  . Arthritis/Rheumatoid Mother   . Liver disease Father   . Asthma Sister   . Stroke Sister   . COPD Sister   . Emphysema Sister   . Colon cancer Maternal Aunt   . Lung cancer Maternal Aunt   . Pancreatic cancer Brother     Review of Systems: A 12-system review of systems was performed and was negative except as noted in the HPI.  --------------------------------------------------------------------------------------------------  Physical Exam: BP (!) 146/90   Pulse 77   Ht 5\' 5"  (1.651 m)   Wt 180 lb 12.8 oz (82 kg)   SpO2 95%   BMI 30.09 kg/m   General: NAD. HEENT: No conjunctival pallor or scleral icterus. Moist mucous membranes.  OP clear. Neck: Supple without lymphadenopathy, thyromegaly, JVD, or HJR. Lungs: Normal work of breathing. Clear to auscultation bilaterally without wheezes or crackles. Heart: Regular rate and rhythm without murmurs, rubs, or gallops. Non-displaced PMI. Abd: Bowel sounds present. Soft, NT/ND without  hepatosplenomegaly Ext: No lower extremity edema. Radial, PT, and DP pulses are 2+ bilaterally. Skin: Warm and dry without rash.   Lab Results  Component Value Date   WBC 6.6 08/01/2016   HGB 13.2 08/01/2016   HCT 39.2 08/01/2016   MCV 89 08/01/2016   PLT 153 08/01/2016    Lab Results  Component Value Date   NA 142 02/13/2017   K 3.8 02/13/2017   CL 107 (H) 02/13/2017   CO2 21 02/13/2017   BUN 20 02/13/2017   CREATININE 1.00 02/13/2017   GLUCOSE 94 02/13/2017   ALT 11 07/20/2017    Lab Results  Component Value Date   CHOL 131 07/20/2017   HDL 55 07/20/2017   LDLCALC 63 07/20/2017   TRIG 65 07/20/2017   CHOLHDL 2.4 07/20/2017    --------------------------------------------------------------------------------------------------  ASSESSMENT AND PLAN: Coronary artery disease [name] [name] denies significant chest pain or shortness of breath despite significant multivessel CAD that has been managed medically since 2009 due to poor CABG and PCI targets at the time.  In the past, she did not tolerate isosorbide mononitrate.  However, given lack of recent symptoms, we will forego any new medications today.  If she were to  have worsening of symptoms, repeat catheterization would need to be considered to determine if there are any potential targets for PCI at this time.  We will continue indefinite clopidogrel for single antiplatelet therapy.  Mild aortic stenosis No new symptoms.  Very mild AS noted by echo last year.  Continue clinical follow-up.  Hypertension Blood pressure suboptimally controlled today.  We discussed adding amlodipine or beta-blocker, though the patient wishes to defer this for now.  This will need to be readdressed at follow-up.  Hyperlipidemia Most recent lipid panel by her PCP in June showed an LDL of 80, which is not at goal (goal less than 70).  [name] [name] is concerned that rosuvastatin may be contributing to her myalgias and fatigue.  We have  therefore agreed to a 1 month statin holiday.  If her symptoms improve, we will need to refer her to the lipid clinic to discuss PCSK9 inhibitor therapy.  Otherwise, we will need to restart rosuvastatin and consider escalating it to 40 mg daily.  Follow-up: Return to clinic in 1 month for follow-up with APP.  Long-term, I will have the patient follow-up with Dr. Eldridge Dace, given my transition to the [facility] office.  [name], MD 01/18/2018 12:34 PM

## 2018-01-18 NOTE — Patient Instructions (Addendum)
Medication Instructions:  HOLD Rosuvastatin (CRESTOR)  -- If you need a refill on your cardiac medications before your next appointment, please call your pharmacy. --  Labwork: None ordered  Testing/Procedures: None ordered  Follow-Up: Your physician wants you to follow-up in: 1 MONTH with APP    Long Term with Dr Eldridge Dace  Thank you for choosing [facility]!!    Any Other Special Instructions Will Be Listed Below (If Applicable).   Encouraged to exercise/weight control

## 2018-01-22 DIAGNOSIS — K1379 Other lesions of oral mucosa: Secondary | ICD-10-CM | POA: Diagnosis not present

## 2018-01-22 DIAGNOSIS — L309 Dermatitis, unspecified: Secondary | ICD-10-CM | POA: Diagnosis not present

## 2018-01-22 DIAGNOSIS — Z658 Other specified problems related to psychosocial circumstances: Secondary | ICD-10-CM | POA: Diagnosis not present

## 2018-01-30 ENCOUNTER — Encounter: Payer: Self-pay | Admitting: Physician Assistant

## 2018-01-31 DIAGNOSIS — M859 Disorder of bone density and structure, unspecified: Secondary | ICD-10-CM | POA: Diagnosis not present

## 2018-02-14 DIAGNOSIS — M48061 Spinal stenosis, lumbar region without neurogenic claudication: Secondary | ICD-10-CM | POA: Diagnosis not present

## 2018-02-19 NOTE — Progress Notes (Signed)
Cardiology Office Note    Date:  02/20/2018   ID:  [name] [name], DOB December 25, 1935, MRN [id number]  PCP:  [name] [name] (Inactive)  Cardiologist: [name] Muss, MD EPS: None  Chief Complaint  Patient presents with  . Follow-up    History of Present Illness:  [name] [name] is a 82 y.o. female with history of multivessel CAD on cath in 2009 managed medically due to poor PCI and CABG targets.  Also has hypertension, HLD, OSA not on CPAP, PACs, PVCs, mild aortic stenosis.  Last saw Dr. Okey Dupre 01/18/2018 at which time she was not having any angina.  He felt if she had worsening symptoms repeat cath would need to be considered to determine if there are any potential targets for PCI at this time.  Continue clopidogrel for single antiplatelet therapy.  She was having myalgias and fatigue was concerned it was secondary to statins.  He agreed to 1 month off of statins if her symptoms improve refer for PCSK9 inhibitor therapy.  LDL was at 80 and not at goal.  If her symptoms do not improve with 1 month off he recommended increasing her rosuvastatin to 40 mg daily.  Patient comes in for f/u. Denies chest pain. Says she gets hot and sweaty so she took a NTG and fell asleep and woke up soaking wet.  Use another nitroglycerin the other night for finding feeling chest became sweaty and dizzy.  Stopped the Plavix because she said her pharmacy ran out of it and they stopped making the drug.  Stop the Lipitor but just got a shot for her rheumatoid arthritis and feeling better.  She is not sure whether her myalgias were from them rheumatoid arthritis or the Lipitor.  Willing to restart.  Trying to eat better and exercise.  Husband has dementia and she is struggling taking care of him.  She actually lost him coming here today.    Past Medical History:  Diagnosis Date  . Arthritis   . CAD (coronary artery disease)    a. 02/2008 Cath: EF 60%, RCA 100 w/ L->R collats, LM 20d, RI 60-70, small, LAD 60/14m,  99d, D1 50, OM1 50. Poor distal targets for CABG/no good interventional target -> medical Rx; b. Myoview in [place] in 3/11 -nl;  c. 07/2011 MV: EF 67%, mild inf ischemia->Med Rx; 02/2015 MV: EF 50%, small, mod intensity apical and apical inf rev defect-->Med Rx.  . Chronic cough    hx; ACEI stopped, possible contribution from GERD  . Depression   . Dyspnea    a. 08/2013 Echo: EF 60-65%, mild AS;  b. PFTs (10/10): FVC 103%, ratio 74%. mild obstruction; c. CT chest (6/11) no evidence for interstitial lung dz. possible asthma/upper airway cough sydrome. aspirin & beta blockers- potential causes for bronchospasm;  . GERD (gastroesophageal reflux disease)   . HBV (hepatitis B virus) infection   . HCV (hepatitis C virus)   . History of PAC's    hx  . History of PVC's    hx  . HLD (hyperlipidemia)    unable to tolerate most statins due to myalgias and elevated CK. thinks pravastatin casued a rash. Only tolerates crestor.  . Hypertensive heart disease   . IBS (irritable bowel syndrome)   . Obesity   . Osteoarthritis    knee; s/p TKR  . Osteoporosis   . Pancreatitis    hx  . PUD (peptic ulcer disease)   . Sleep apnea    diagnosed in 2011 ,  did not want CPAP -     Past Surgical History:  Procedure Laterality Date  . BREAST EXCISIONAL BIOPSY Right 1990  . calcification of right breast-lumps Right 1998  . CARDIAC CATHETERIZATION  2009  . CHOLECYSTECTOMY    . hyesterectomy, unspec. area  1998  . pancreatic duct stent    . TUBAL LIGATION  1995     Current Medications: Current Meds  Medication Sig  . acetaminophen (TYLENOL) 325 MG tablet Per bottle as needed  . albuterol (PROAIR HFA) 108 (90 Base) MCG/ACT inhaler Inhale 2 puffs into the lungs every 4 (four) hours as needed for wheezing or shortness of breath.  Marland Kitchen albuterol (PROVENTIL) (2.5 MG/3ML) 0.083% nebulizer solution Take 3 mLs (2.5 mg total) by nebulization every 6 (six) hours as needed for wheezing or shortness of breath.  .  budesonide-formoterol (SYMBICORT) 80-4.5 MCG/ACT inhaler Inhale 2 puffs into the lungs 2 (two) times daily.  . clopidogrel (PLAVIX) 75 MG tablet Take 1 tablet (75 mg total) by mouth daily.  . ergocalciferol (VITAMIN D2) 50000 units capsule Take 50,000 Units by mouth once a week.  . famotidine (PEPCID) 20 MG tablet Take 20 mg by mouth at bedtime.  . hydrochlorothiazide (HYDRODIURIL) 25 MG tablet Take 1 tablet (25 mg total) by mouth daily.  . nitroGLYCERIN (NITROSTAT) 0.4 MG SL tablet Place 1 tablet (0.4 mg total) under the tongue every 5 (five) minutes as needed for chest pain.  . pantoprazole (PROTONIX) 40 MG tablet Take 40 mg by mouth daily.  Marland Kitchen telmisartan (MICARDIS) 80 MG tablet Take 1 tablet (80 mg total) by mouth daily.  Marland Kitchen triamcinolone cream (KENALOG) 0.1 % APPLY TO BUMPY/ITCHY SKIN TWICE A DAY AS NEEDED FOR ITCHING  . [DISCONTINUED] clopidogrel (PLAVIX) 75 MG tablet TAKE 1 TABLET BY MOUTH EVERY DAY     Allergies:   Aspirin; Clarithromycin; Doxycycline; Pravastatin; Levaquin [levofloxacin in d5w]; and Penicillins   Social History   Socioeconomic History  . Marital status: Married    Spouse name: Lollie Sails  . Number of children: 4  . Years of education: 35  . Highest education level: Not on file  Occupational History  . Occupation: retired     Associate Professor: RETIRED    CommentActuary  . Financial resource strain: Not on file  . Food insecurity:    Worry: Not on file    Inability: Not on file  . Transportation needs:    Medical: Not on file    Non-medical: Not on file  Tobacco Use  . Smoking status: Never Smoker  . Smokeless tobacco: Never Used  . Tobacco comment: 2nd hand exposure x40 years   Substance and Sexual Activity  . Alcohol use: No  . Drug use: No  . Sexual activity: Not Currently  Lifestyle  . Physical activity:    Days per week: Not on file    Minutes per session: Not on file  . Stress: Not on file  Relationships  . Social connections:     Talks on phone: Not on file    Gets together: Not on file    Attends religious service: Not on file    Active member of club or organization: Not on file    Attends meetings of clubs or organizations: Not on file    Relationship status: Not on file  Other Topics Concern  . Not on file  Social History Narrative   Married, lives with husband; has children.   Retired Government social research officer.  Cell # [phone]; okay to leave a message.         Family History:  The patient's family history includes Arthritis/Rheumatoid in her mother; Asthma in her mother and sister; COPD in her sister; Colon cancer in her maternal aunt; Emphysema in her sister; Liver disease in her father; Lung cancer in her maternal aunt; Pancreatic cancer in her brother; Stroke in her mother and sister.   ROS:   Please see the history of present illness.    Review of Systems  Constitution: Positive for diaphoresis.  HENT: Negative.   Eyes: Negative.   Cardiovascular: Positive for chest pain.  Respiratory: Negative.   Hematologic/Lymphatic: Negative.   Musculoskeletal: Negative.  Negative for joint pain.  Gastrointestinal: Negative.   Genitourinary: Negative.   Neurological: Negative.    All other systems reviewed and are negative.   PHYSICAL EXAM:   VS:  BP 126/70   Pulse 75   Ht 5\' 5"  (1.651 m)   Wt 177 lb 9.6 oz (80.6 kg)   SpO2 97%   BMI 29.55 kg/m   Physical Exam  GEN: Well nourished, well developed, in no acute distress  Neck: no JVD, carotid bruits, or masses Cardiac:RRR; no murmurs, rubs, or gallops  Respiratory:  clear to auscultation bilaterally, normal work of breathing GI: soft, nontender, nondistended, + BS Ext: without cyanosis, clubbing, or edema, Good distal pulses bilaterally Neuro:  Alert and Oriented x 3 Psych: euthymic mood, full affect  Wt Readings from Last 3 Encounters:  02/20/18 177 lb 9.6 oz (80.6 kg)  01/18/18 180 lb 12.8 oz (82 kg)  12/23/17 175 lb (79.4 kg)       Studies/Labs Reviewed:   EKG:  EKG is not ordered today.   Recent Labs: 07/20/2017: ALT 11   Lipid Panel    Component Value Date/Time   CHOL 131 07/20/2017 0749   TRIG 65 07/20/2017 0749   HDL 55 07/20/2017 0749   CHOLHDL 2.4 07/20/2017 0749   CHOLHDL 2.0 06/16/2015 0800   VLDL 12 06/16/2015 0800   LDLCALC 63 07/20/2017 0749    Additional studies/ records that were reviewed today include:  2D echo 3/2/2018Study Conclusions   - Left ventricle: The cavity size was normal. There was mild focal   basal hypertrophy of the septum. Systolic function was normal.   The estimated ejection fraction was in the range of 60% to 65%.   Wall motion was normal; there were no regional wall motion   abnormalities. Doppler parameters are consistent with abnormal   left ventricular relaxation (grade 1 diastolic dysfunction). - Aortic valve: There was very mild stenosis.   Nuclear stress test 2016Study Highlights    The left ventricular ejection fraction is mildly decreased (45-54%).  Nuclear stress EF: 50%.  There was no ST segment deviation noted during stress.  No T wave inversion was noted during stress.  Defect 1: There is a small defect of moderate severity.   There is a small size, moderate intensity reversible apical and apical inferior wall perfusion defect. This may represent ischemia (SDS 5). There is underlying inferior bowel attenuation and the calculated LVEF is 50% with inferolateral wall hypokinesis. This is an intermediate risk study. Clinical correlation is advised.       ASSESSMENT:    1. Coronary artery disease involving native coronary artery of native heart without angina pectoris   2. Essential hypertension   3. Mild aortic stenosis   4. Hyperlipidemia LDL goal <70   5. Hyperlipidemia, unspecified hyperlipidemia type  PLAN:  In order of problems listed above:  CAD on cardiac catheter 2009 treated medically because no good targets for PCI or CABG.   Has not tolerated Imdur in the past.  Has taken nitroglycerin twice for unclear symptoms but did not tolerate it well.  Stopped her Plavix because pharmacy ran out.  Will restart Plavix 75 mg once daily with food.  Follow-up with Dr. Eldridge Dace in 3 to 4 months.  Essential hypertension blood pressure controlled on Micardis  Mild aortic stenosis on echo  Hyperlipidemia was having myalgias that she thought was secondary to rosuvastatin and was given a statin free holiday for 1 month.  Now she thinks it may have been from her rheumatoid arthritis that she is feeling better after an injection.  Willing to restart Lipitor 40 mg once daily we will repeat labs in 3 months.  If she does not tolerate consider PCSK9 inhibitor.    Medication Adjustments/Labs and Tests Ordered: Current medicines are reviewed at length with the patient today.  Concerns regarding medicines are outlined above.  Medication changes, Labs and Tests ordered today are listed in the Patient Instructions below. Patient Instructions  Medication Instructions:  Your physician has recommended you make the following change in your medication:   1. RESTART: atorvastatin (lipitor) 40 mg tablet: Take 1 tablet by mouth once daily  2. RESTART: clopidogrel (plavix) 75 mg tablet: Take 1 tablet by mouth once daily  Labwork: Your physician recommends that you return for a FASTING lipid profile and liver function panel on 05/22/18   Testing/Procedures: None ordered  Follow-Up: Your physician recommends that you follow-up with Dr. Eldridge Dace on 05/24/18 at 9:20 AM   Any Other Special Instructions Will Be Listed Below (If Applicable).     If you need a refill on your cardiac medications before your next appointment, please call your pharmacy.     Elson Clan, PA-C  02/20/2018 9:43 AM    [facility] [address] [place], [place], [place]  [address] Phone: [phone]; Fax: [phone]

## 2018-02-20 ENCOUNTER — Encounter: Payer: Self-pay | Admitting: Physician Assistant

## 2018-02-20 ENCOUNTER — Ambulatory Visit (INDEPENDENT_AMBULATORY_CARE_PROVIDER_SITE_OTHER): Payer: PPO | Admitting: Physician Assistant

## 2018-02-20 VITALS — BP 126/70 | HR 75 | Ht 65.0 in | Wt 177.6 lb

## 2018-02-20 DIAGNOSIS — E785 Hyperlipidemia, unspecified: Secondary | ICD-10-CM

## 2018-02-20 DIAGNOSIS — I350 Nonrheumatic aortic (valve) stenosis: Secondary | ICD-10-CM

## 2018-02-20 DIAGNOSIS — I2510 Atherosclerotic heart disease of native coronary artery without angina pectoris: Secondary | ICD-10-CM | POA: Diagnosis not present

## 2018-02-20 DIAGNOSIS — I10 Essential (primary) hypertension: Secondary | ICD-10-CM | POA: Diagnosis not present

## 2018-02-20 MED ORDER — CLOPIDOGREL BISULFATE 75 MG PO TABS: 75.0000 mg | ORAL_TABLET | Freq: Every day | ORAL | 3 refills | Status: DC

## 2018-02-20 MED ORDER — ATORVASTATIN CALCIUM 40 MG PO TABS: 40.0000 mg | ORAL_TABLET | Freq: Every day | ORAL | 3 refills | Status: DC

## 2018-02-20 NOTE — Patient Instructions (Signed)
Medication Instructions:  Your physician has recommended you make the following change in your medication:   1. RESTART: atorvastatin (lipitor) 40 mg tablet: Take 1 tablet by mouth once daily  2. RESTART: clopidogrel (plavix) 75 mg tablet: Take 1 tablet by mouth once daily  Labwork: Your physician recommends that you return for a FASTING lipid profile and liver function panel on 05/22/18   Testing/Procedures: None ordered  Follow-Up: Your physician recommends that you follow-up with Dr. Eldridge Dace on 05/24/18 at 9:20 AM   Any Other Special Instructions Will Be Listed Below (If Applicable).     If you need a refill on your cardiac medications before your next appointment, please call your pharmacy.

## 2018-02-26 DIAGNOSIS — H34811 Central retinal vein occlusion, right eye, with macular edema: Secondary | ICD-10-CM | POA: Diagnosis not present

## 2018-02-26 DIAGNOSIS — H35041 Retinal micro-aneurysms, unspecified, right eye: Secondary | ICD-10-CM | POA: Diagnosis not present

## 2018-02-26 DIAGNOSIS — H2512 Age-related nuclear cataract, left eye: Secondary | ICD-10-CM | POA: Diagnosis not present

## 2018-02-26 DIAGNOSIS — H2511 Age-related nuclear cataract, right eye: Secondary | ICD-10-CM | POA: Diagnosis not present

## 2018-02-27 ENCOUNTER — Telehealth: Payer: Self-pay | Admitting: Physician Assistant

## 2018-02-27 DIAGNOSIS — E785 Hyperlipidemia, unspecified: Secondary | ICD-10-CM

## 2018-02-27 NOTE — Telephone Encounter (Signed)
New Message    Pt c/o medication issue:  1. Name of Medication: PLAVIX, ATORVASTATIN and hydrochlorothiazide (HYDRODIURIL)  2. How are you currently taking this medication (dosage and times per day)?   3. Are you having a reaction (difficulty breathing--STAT)?   4. What is your medication issue? Patient is calling because she says one of these medications is causing her to have aching joints.

## 2018-02-27 NOTE — Telephone Encounter (Signed)
Please have patient stop the atorvastatin and refer to lipid clinic for PCSK9 inhibitor

## 2018-02-27 NOTE — Telephone Encounter (Signed)
Returned call to the patient who states that since her visit on 02/20/18 she has had severe pain in her joints. Patient was instructed to restart plavix and atorvastatin 40 mg QD. Patient states that she received an injection for her arthritis just before her most recent OV. Patient also takes HCTZ and no potassium supplement. She is however taking telmisartan as well. No recent BMET on file. However, patient states that her joint pain is all over and not just in her legs and her Sx did not start until after restarting atorvastatin. She denies additional Sx. Patient states that she has tried multiple statin meds in the past. Made patient aware that I will forward the information to Jacolyn Reedy, PA for review and recommendation.

## 2018-02-28 NOTE — Telephone Encounter (Signed)
Called and made patient aware of recommendations below. Referral to Lipid Clinic placed and patient scheduled on 03/07/18 at 11:30 AM.

## 2018-03-02 DIAGNOSIS — I10 Essential (primary) hypertension: Secondary | ICD-10-CM | POA: Diagnosis not present

## 2018-03-02 DIAGNOSIS — L259 Unspecified contact dermatitis, unspecified cause: Secondary | ICD-10-CM | POA: Diagnosis not present

## 2018-03-02 DIAGNOSIS — M546 Pain in thoracic spine: Secondary | ICD-10-CM | POA: Diagnosis not present

## 2018-03-03 ENCOUNTER — Other Ambulatory Visit: Payer: Self-pay

## 2018-03-03 ENCOUNTER — Ambulatory Visit (HOSPITAL_COMMUNITY)
Admission: EM | Admit: 2018-03-03 | Discharge: 2018-03-03 | Disposition: A | Discharge status: Home / Self Care | Payer: PPO | Attending: Family Medicine | Admitting: Family Medicine

## 2018-03-03 ENCOUNTER — Encounter (HOSPITAL_COMMUNITY): Payer: Self-pay

## 2018-03-03 DIAGNOSIS — R21 Rash and other nonspecific skin eruption: Secondary | ICD-10-CM | POA: Diagnosis not present

## 2018-03-03 MED ORDER — VALACYCLOVIR HCL 1 G PO TABS: 1000.0000 mg | ORAL_TABLET | Freq: Three times a day (TID) | ORAL | 0 refills | Status: DC

## 2018-03-03 MED ORDER — CAPSAICIN 0.025 % EX CREA: TOPICAL_CREAM | Freq: Two times a day (BID) | CUTANEOUS | 0 refills | Status: DC

## 2018-03-03 NOTE — ED Triage Notes (Signed)
Pt presents today with a rash on right hand that spreads to up arm. No itching but states there is pain. She feels the pain all the way up her arm to where her right breast area is.

## 2018-03-07 ENCOUNTER — Ambulatory Visit: Payer: PPO

## 2018-03-19 NOTE — ED Provider Notes (Signed)
[facility]   [id number] 03/03/18 Arrival Time: 1128  ASSESSMENT & PLAN:  1. Rash    Meds ordered this encounter  Medications  . valACYclovir (VALTREX) 1000 MG tablet    Sig: Take 1 tablet (1,000 mg total) by mouth 3 (three) times daily.    Dispense:  21 tablet    Refill:  0  . capsaicin (ZOSTRIX) 0.025 % cream    Sig: Apply topically 2 (two) times daily.    Dispense:  30 g    Refill:  0   Possibility of shingles and will treat as such. Observe. Will follow up with PCP or here if worsening or failing to improve as anticipated. Reviewed expectations re: course of current medical issues. Questions answered. Outlined signs and symptoms indicating need for more acute intervention. Patient verbalized understanding. After Visit Summary given.   SUBJECTIVE:  [name] [name] is a 82 y.o. female who presents with a skin complaint.   Location: RUE Onset: gradual Duration: few days Associated pruritis? none Associated pain? Mild to moderate discomfort; diffuse; "maybe sharp or tingling" Progression: stable  Drainage? No  Known trigger? No  New soaps/lotions/topicals/detergents/environmental exposures? No Contacts with similar? No Recent travel? No  Other associated symptoms: none Therapies tried thus far: none Arthralgia or myalgia? none Recent illness? none Fever? none No specific aggravating or alleviating factors reported. No h/o similar before.  ROS: As per HPI.  OBJECTIVE: Vitals:   03/03/18 1231  BP: 129/67  Pulse: 63  Resp: 18  Temp: 97.6 F (36.4 C)  TempSrc: Oral  SpO2: 98%    General appearance: alert; no distress Lungs: clear to auscultation bilaterally Heart: regular rate and rhythm Extremities: no edema Skin: warm and dry; signs of bacterial infection: no; few crops of red/purplish papules over her RUE forearm to elbow Psychological: alert and cooperative; normal mood and affect  Allergies  Allergen Reactions  . Aspirin Nausea  Only  . Clarithromycin Other (See Comments)    unknown  . Doxycycline Nausea Only  . Pravastatin Itching    REACTION: itching  . Levaquin [Levofloxacin In D5w] Rash    Rash, HA  . Penicillins Itching and Rash    Other reaction(s): Unknown Has patient had a PCN reaction causing immediate rash, facial/tongue/throat swelling, SOB or lightheadedness with hypotension: No Has patient had a PCN reaction causing severe rash involving mucus membranes or skin necrosis: No Has patient had a PCN reaction that required hospitalization No Has patient had a PCN reaction occurring within the last 10 years: No If all of the above answers are "NO", then may proceed with Cephalosporin use.     Past Medical History:  Diagnosis Date  . Arthritis   . CAD (coronary artery disease)    a. 02/2008 Cath: EF 60%, RCA 100 w/ L->R collats, LM 20d, RI 60-70, small, LAD 60/40m, 99d, D1 50, OM1 50. Poor distal targets for CABG/no good interventional target -> medical Rx; b. Myoview in [place] in 3/11 -nl;  c. 07/2011 MV: EF 67%, mild inf ischemia->Med Rx; 02/2015 MV: EF 50%, small, mod intensity apical and apical inf rev defect-->Med Rx.  . Chronic cough    hx; ACEI stopped, possible contribution from GERD  . Depression   . Dyspnea    a. 08/2013 Echo: EF 60-65%, mild AS;  b. PFTs (10/10): FVC 103%, ratio 74%. mild obstruction; c. CT chest (6/11) no evidence for interstitial lung dz. possible asthma/upper airway cough sydrome. aspirin & beta blockers- potential causes for bronchospasm;  .  GERD (gastroesophageal reflux disease)   . HBV (hepatitis B virus) infection   . HCV (hepatitis C virus)   . History of PAC's    hx  . History of PVC's    hx  . HLD (hyperlipidemia)    unable to tolerate most statins due to myalgias and elevated CK. thinks pravastatin casued a rash. Only tolerates crestor.  . Hypertensive heart disease   . IBS (irritable bowel syndrome)   . Obesity   . Osteoarthritis    knee; s/p TKR  . Osteoporosis    . Pancreatitis    hx  . PUD (peptic ulcer disease)   . Sleep apnea    diagnosed in 2011 , did not want CPAP -    Social History   Socioeconomic History  . Marital status: Married    Spouse name: Lollie Sails  . Number of children: 4  . Years of education: 65  . Highest education level: Not on file  Occupational History  . Occupation: retired     Associate Professor: RETIRED    CommentActuary  . Financial resource strain: Not on file  . Food insecurity:    Worry: Not on file    Inability: Not on file  . Transportation needs:    Medical: Not on file    Non-medical: Not on file  Tobacco Use  . Smoking status: Never Smoker  . Smokeless tobacco: Never Used  . Tobacco comment: 2nd hand exposure x40 years   Substance and Sexual Activity  . Alcohol use: No  . Drug use: No  . Sexual activity: Not Currently  Lifestyle  . Physical activity:    Days per week: Not on file    Minutes per session: Not on file  . Stress: Not on file  Relationships  . Social connections:    Talks on phone: Not on file    Gets together: Not on file    Attends religious service: Not on file    Active member of club or organization: Not on file    Attends meetings of clubs or organizations: Not on file    Relationship status: Not on file  . Intimate partner violence:    Fear of current or ex partner: Not on file    Emotionally abused: Not on file    Physically abused: Not on file    Forced sexual activity: Not on file  Other Topics Concern  . Not on file  Social History Narrative   Married, lives with husband; has children.   Retired Government social research officer.       Cell # [phone]; okay to leave a message.       Family History  Problem Relation Age of Onset  . Asthma Mother   . Stroke Mother        massive  . Arthritis/Rheumatoid Mother   . Liver disease Father   . Asthma Sister   . Stroke Sister   . COPD Sister   . Emphysema Sister   . Colon cancer Maternal Aunt   .  Lung cancer Maternal Aunt   . Pancreatic cancer Brother    Past Surgical History:  Procedure Laterality Date  . BREAST EXCISIONAL BIOPSY Right 1990  . calcification of right breast-lumps Right 1998  . CARDIAC CATHETERIZATION  2009  . CHOLECYSTECTOMY    . hyesterectomy, unspec. area  1998  . pancreatic duct stent    . TUBAL LIGATION  1995      Mardella Layman, MD  03/19/18 0835  

## 2018-03-21 DIAGNOSIS — Z658 Other specified problems related to psychosocial circumstances: Secondary | ICD-10-CM | POA: Diagnosis not present

## 2018-03-21 DIAGNOSIS — B0229 Other postherpetic nervous system involvement: Secondary | ICD-10-CM | POA: Diagnosis not present

## 2018-05-02 DIAGNOSIS — M48061 Spinal stenosis, lumbar region without neurogenic claudication: Secondary | ICD-10-CM | POA: Diagnosis not present

## 2018-05-07 DIAGNOSIS — J45909 Unspecified asthma, uncomplicated: Secondary | ICD-10-CM | POA: Diagnosis not present

## 2018-05-07 DIAGNOSIS — M79672 Pain in left foot: Secondary | ICD-10-CM | POA: Diagnosis not present

## 2018-05-07 DIAGNOSIS — I10 Essential (primary) hypertension: Secondary | ICD-10-CM | POA: Diagnosis not present

## 2018-05-07 DIAGNOSIS — M79671 Pain in right foot: Secondary | ICD-10-CM | POA: Diagnosis not present

## 2018-05-07 DIAGNOSIS — K219 Gastro-esophageal reflux disease without esophagitis: Secondary | ICD-10-CM | POA: Diagnosis not present

## 2018-05-22 ENCOUNTER — Other Ambulatory Visit: Payer: PPO

## 2018-05-22 DIAGNOSIS — I10 Essential (primary) hypertension: Secondary | ICD-10-CM | POA: Diagnosis not present

## 2018-05-22 DIAGNOSIS — I350 Nonrheumatic aortic (valve) stenosis: Secondary | ICD-10-CM

## 2018-05-22 DIAGNOSIS — I2510 Atherosclerotic heart disease of native coronary artery without angina pectoris: Secondary | ICD-10-CM

## 2018-05-22 DIAGNOSIS — E785 Hyperlipidemia, unspecified: Secondary | ICD-10-CM

## 2018-05-22 LAB — HEPATIC FUNCTION PANEL
ALT: 8 IU/L (ref 0–32)
AST: 9 IU/L (ref 0–40)
Albumin: 4.1 g/dL (ref 3.5–4.7)
Alkaline Phosphatase: 104 IU/L (ref 39–117)
Bilirubin Total: 0.4 mg/dL (ref 0.0–1.2)
Bilirubin, Direct: 0.1 mg/dL (ref 0.00–0.40)
Total Protein: 6.5 g/dL (ref 6.0–8.5)

## 2018-05-22 LAB — LIPID PANEL
Chol/HDL Ratio: 4.1 ratio (ref 0.0–4.4)
Cholesterol, Total: 194 mg/dL (ref 100–199)
HDL: 47 mg/dL (ref >39)
LDL Calculated: 131 mg/dL — ABNORMAL HIGH (ref 0–99)
Triglycerides: 79 mg/dL (ref 0–149)
VLDL Cholesterol Cal: 16 mg/dL (ref 5–40)

## 2018-05-23 NOTE — Progress Notes (Signed)
Cardiology Office Note   Date:  05/24/2018   ID:  [name] [name], DOB [birth year], MRN [id number]  PCP:  Jarome Matin (Inactive)    No chief complaint on file.  CAD  Wt Readings from Last 3 Encounters:  05/24/18 175 lb 9.6 oz (79.7 kg)  02/20/18 177 lb 9.6 oz (80.6 kg)  01/18/18 180 lb 12.8 oz (82 kg)       History of Present Illness: [name] [name] is a 82 y.o. female  with history of multivessel CAD on cath in 2009 managed medically due to poor PCI and CABG targets.  Also has hypertension, HLD, OSA not on CPAP, PACs, PVCs, mild aortic stenosis.  Last saw Dr. Okey Dupre 01/18/2018 at which time she was not having any angina.  He felt if she had worsening symptoms repeat cath would need to be considered to determine if there are any potential targets for PCI at this time.  Continue clopidogrel for single antiplatelet therapy.  She was having myalgias and fatigue was concerned it was secondary to statins.  He agreed to 1 month off of statins if her symptoms improve refer for PCSK9 inhibitor therapy.  LDL was at 80 and not at goal.  If her symptoms do not improve with 1 month off he recommended increasing her rosuvastatin to 40 mg daily.  Husband has dementia and she is struggling taking care of him.  Since last visit, she did not want to follow Dr. Okey Dupre to [place].  She will be seeing me going forward.  She has had shingles affecting her right arm.     Denies : Chest pain. Dizziness. Leg edema. Nitroglycerin use. Orthopnea. Palpitations. Paroxysmal nocturnal dyspnea. Syncope.   She feels she was intolerant to a medicine Dr. Okey Dupre had started due to anxiety.  She has had joint pains on some meds.  SHe feels she is intolerant to Plavix.  She feels better with clopidogrel instead.  No bleeding problems.   She feels better on less medicine.    She does not walk regularly for exercise.  SHe remains active taking care of her husband.    BP typically in the 140/76 range.  Past  Medical History:  Diagnosis Date  . Arthritis   . CAD (coronary artery disease)    a. 02/2008 Cath: EF 60%, RCA 100 w/ L->R collats, LM 20d, RI 60-70, small, LAD 60/2m, 99d, D1 50, OM1 50. Poor distal targets for CABG/no good interventional target -> medical Rx; b. Myoview in [place] in 3/11 -nl;  c. 07/2011 MV: EF 67%, mild inf ischemia->Med Rx; 02/2015 MV: EF 50%, small, mod intensity apical and apical inf rev defect-->Med Rx.  . Chronic cough    hx; ACEI stopped, possible contribution from GERD  . Depression   . Dyspnea    a. 08/2013 Echo: EF 60-65%, mild AS;  b. PFTs (10/10): FVC 103%, ratio 74%. mild obstruction; c. CT chest (6/11) no evidence for interstitial lung dz. possible asthma/upper airway cough sydrome. aspirin & beta blockers- potential causes for bronchospasm;  . GERD (gastroesophageal reflux disease)   . HBV (hepatitis B virus) infection   . HCV (hepatitis C virus)   . History of PAC's    hx  . History of PVC's    hx  . HLD (hyperlipidemia)    unable to tolerate most statins due to myalgias and elevated CK. thinks pravastatin casued a rash. Only tolerates crestor.  . Hypertensive heart disease   . IBS (irritable bowel syndrome)   .  Obesity   . Osteoarthritis    knee; s/p TKR  . Osteoporosis   . Pancreatitis    hx  . PUD (peptic ulcer disease)   . Sleep apnea    diagnosed in 2011 , did not want CPAP -     Past Surgical History:  Procedure Laterality Date  . BREAST EXCISIONAL BIOPSY Right 1990  . calcification of right breast-lumps Right 1998  . CARDIAC CATHETERIZATION  2009  . CHOLECYSTECTOMY    . hyesterectomy, unspec. area  1998  . pancreatic duct stent    . TUBAL LIGATION  1995      Current Outpatient Medications  Medication Sig Dispense Refill  . acetaminophen (TYLENOL) 325 MG tablet Per bottle as needed    . albuterol (PROAIR HFA) 108 (90 Base) MCG/ACT inhaler Inhale 2 puffs into the lungs every 4 (four) hours as needed for wheezing or shortness of breath.  1 Inhaler 1  . albuterol (PROVENTIL) (2.5 MG/3ML) 0.083% nebulizer solution Take 3 mLs (2.5 mg total) by nebulization every 6 (six) hours as needed for wheezing or shortness of breath. 270 mL 0  . budesonide-formoterol (SYMBICORT) 80-4.5 MCG/ACT inhaler Inhale 2 puffs into the lungs 2 (two) times daily. 1 Inhaler 11  . famotidine (PEPCID) 20 MG tablet famotidine 20 mg tablet  1 tablet by mouth daily    . hydrochlorothiazide (HYDRODIURIL) 25 MG tablet Take 1 tablet (25 mg total) by mouth daily. 90 tablet 1  . nitroGLYCERIN (NITROSTAT) 0.4 MG SL tablet Place 1 tablet (0.4 mg total) under the tongue every 5 (five) minutes as needed for chest pain. 100 tablet 1  . pantoprazole (PROTONIX) 40 MG tablet Take 40 mg by mouth daily.  12  . telmisartan (MICARDIS) 80 MG tablet Take 1 tablet (80 mg total) by mouth daily. 90 tablet 2  . clopidogrel (PLAVIX) 75 MG tablet Take 1 tablet (75 mg total) by mouth daily. (Patient not taking: Reported on 05/24/2018) 90 tablet 3   No current facility-administered medications for this visit.     Allergies:   Aspirin; Clarithromycin; Doxycycline; Pravastatin; Levaquin [levofloxacin in d5w]; and Penicillins    Social History:  The patient  reports that she has never smoked. She has never used smokeless tobacco. She reports that she does not drink alcohol or use drugs.   Family History:  The patient's family history includes Arthritis/Rheumatoid in her mother; Asthma in her mother and sister; COPD in her sister; Colon cancer in her maternal aunt; Emphysema in her sister; Liver disease in her father; Lung cancer in her maternal aunt; Pancreatic cancer in her brother; Stroke in her mother and sister.    ROS:  Please see the history of present illness.   Otherwise, review of systems are positive for stress.   All other systems are reviewed and negative.    PHYSICAL EXAM: VS:  BP (!) 150/86   Pulse 66   Ht 5\' 5"  (1.651 m)   Wt 175 lb 9.6 oz (79.7 kg)   SpO2 96%   BMI  29.22 kg/m  , BMI Body mass index is 29.22 kg/m. GEN: Well nourished, well developed, in no acute distress  HEENT: normal  Neck: no JVD, carotid bruits, or masses Cardiac: RRR; no murmurs, rubs, or gallops,no edema  Respiratory:  clear to auscultation bilaterally, normal work of breathing GI: soft, nontender, nondistended, + BS MS: no deformity or atrophy  Skin: warm and dry, no rash Neuro:  Strength and sensation are intact Psych: euthymic mood, full  affect   EKG:   The ekg ordered today demonstrates NSR, inferior Q waves. No ST changes.   Recent Labs: 05/22/2018: ALT 8   Lipid Panel    Component Value Date/Time   CHOL 194 05/22/2018 1000   TRIG 79 05/22/2018 1000   HDL 47 05/22/2018 1000   CHOLHDL 4.1 05/22/2018 1000   CHOLHDL 2.0 06/16/2015 0800   VLDL 12 06/16/2015 0800   LDLCALC 131 (H) 05/22/2018 1000     Other studies Reviewed: Additional studies/ records that were reviewed today with results demonstrating: Cath results from 2009 reviewed.   ASSESSMENT AND PLAN:  1. CAD: No angina.  Continue aggressive secondary prevention.  She will transfer her care here to our office, since she did not want to follow Dr. Okey Dupre to [place]. 2. HTN: Better controlled at home.  She gets nervous in the doctor's office. 3. Mild aortic stenosis: No symptoms of severe aortic stenosis.  Normal LV function in 2018. 4. Hyperlipidemia: Intolerant of statins. Lipid clinic referral.    Current medicines are reviewed at length with the patient today.  The patient concerns regarding her medicines were addressed.  The following changes have been made:  No change  Labs/ tests ordered today include:  No orders of the defined types were placed in this encounter.   Recommend 150 minutes/week of aerobic exercise Low fat, low carb, high fiber diet recommended  Disposition:   FU in 6 months   Signed, Lance Muss, MD  05/24/2018 9:53 AM    [facility]  [facility] [address] [place], [place], [place]  [address] Phone: [phone]; Fax: [phone]

## 2018-05-24 ENCOUNTER — Encounter: Payer: Self-pay | Admitting: Interventional Cardiology

## 2018-05-24 ENCOUNTER — Ambulatory Visit: Payer: PPO | Admitting: Interventional Cardiology

## 2018-05-24 VITALS — BP 150/86 | HR 66 | Ht 65.0 in | Wt 175.6 lb

## 2018-05-24 DIAGNOSIS — E785 Hyperlipidemia, unspecified: Secondary | ICD-10-CM | POA: Diagnosis not present

## 2018-05-24 DIAGNOSIS — I2510 Atherosclerotic heart disease of native coronary artery without angina pectoris: Secondary | ICD-10-CM | POA: Diagnosis not present

## 2018-05-24 DIAGNOSIS — I10 Essential (primary) hypertension: Secondary | ICD-10-CM | POA: Diagnosis not present

## 2018-05-24 DIAGNOSIS — I350 Nonrheumatic aortic (valve) stenosis: Secondary | ICD-10-CM

## 2018-05-24 NOTE — Patient Instructions (Signed)
Medication Instructions:  Your physician recommends that you continue on your current medications as directed. Please refer to the Current Medication list given to you today.  If you need a refill on your cardiac medications before your next appointment, please call your pharmacy.   Lab work: None Ordered  If you have labs (blood work) drawn today and your tests are completely normal, you will receive your results only by: Marland Kitchen MyChart Message (if you have MyChart) OR . A paper copy in the mail If you have any lab test that is abnormal or we need to change your treatment, we will call you to review the results.  Testing/Procedures: None ordered  Follow-Up: You have been referred to the Lipid Clinic    At [facility], you and your health needs are our priority.  As part of our continuing mission to provide you with exceptional heart care, we have created designated Provider Care Teams.  These Care Teams include your primary Cardiologist (physician) and Advanced Practice Providers (APPs -  Physician Assistants and Nurse Practitioners) who all work together to provide you with the care you need, when you need it. . You will need a follow up appointment in 6 months.  Please call our office 2 months in advance to schedule this appointment.  You may see Everette Rank, MD or one of the following Advanced Practice Providers on your designated Care Team:   . Robbie Lis, PA-C . Dayna Dunn, PA-C . Jacolyn Reedy, PA-C  Any Other Special Instructions Will Be Listed Below (If Applicable).

## 2018-05-28 DIAGNOSIS — M48061 Spinal stenosis, lumbar region without neurogenic claudication: Secondary | ICD-10-CM | POA: Diagnosis not present

## 2018-06-14 ENCOUNTER — Encounter: Payer: Self-pay | Admitting: Pharmacist

## 2018-06-14 ENCOUNTER — Ambulatory Visit (INDEPENDENT_AMBULATORY_CARE_PROVIDER_SITE_OTHER): Payer: PPO | Admitting: Pharmacist

## 2018-06-14 DIAGNOSIS — E785 Hyperlipidemia, unspecified: Secondary | ICD-10-CM

## 2018-06-14 MED ORDER — ROSUVASTATIN CALCIUM 5 MG PO TABS: 5.0000 mg | ORAL_TABLET | Freq: Every day | ORAL | 3 refills | Status: DC

## 2018-06-14 NOTE — Progress Notes (Signed)
Patient ID: [name] [name]                 DOB: 11-14-1935                    MRN: [id number]     HPI: [name] [name] is a 83 y.o. female patient referred to lipid clinic by Dr. Eldridge Dace. PMH is significant for multivessel CAD on cath in 2009 managed medically due to poor PCI and CABG targets. Also has hypertension, HLD, OSA not on CPAP, PACs, PVCs, mild aortic stenosis.   Patient has a history of myalgias and fatigue with statins. Her muscle pains always stop after stopping the medication. She states that it was the worst with atorvastatin and the higher doses of rosuvastatin. States she thinks she tolerated low doses of rosuvastain fine.   Current Medications: none Intolerances: atorvastatin, rosuvastatin (muscle pains), pravastatin (itching) Risk Factors: CAD LDL goal: <70   Diet: oatmeal, raisin brain, shredded wheat, eggs occassionally, [place] bacon, no pork, [place] and chicken-boiled/baked, cooks with olive oil, eats out twice a month  Exercise: does not exercise  Labs: 05/22/18 TC 194, TG 79, HDL 47, LDL 131 (no therapy)  Past Medical History:  Diagnosis Date  . Arthritis   . CAD (coronary artery disease)    a. 02/2008 Cath: EF 60%, RCA 100 w/ L->R collats, LM 20d, RI 60-70, small, LAD 60/77m, 99d, D1 50, OM1 50. Poor distal targets for CABG/no good interventional target -> medical Rx; b. Myoview in [place] in 3/11 -nl;  c. 07/2011 MV: EF 67%, mild inf ischemia->Med Rx; 02/2015 MV: EF 50%, small, mod intensity apical and apical inf rev defect-->Med Rx.  . Chronic cough    hx; ACEI stopped, possible contribution from GERD  . Depression   . Dyspnea    a. 08/2013 Echo: EF 60-65%, mild AS;  b. PFTs (10/10): FVC 103%, ratio 74%. mild obstruction; c. CT chest (6/11) no evidence for interstitial lung dz. possible asthma/upper airway cough sydrome. aspirin & beta blockers- potential causes for bronchospasm;  . GERD (gastroesophageal reflux disease)   . HBV (hepatitis B virus) infection   .  HCV (hepatitis C virus)   . History of PAC's    hx  . History of PVC's    hx  . HLD (hyperlipidemia)    unable to tolerate most statins due to myalgias and elevated CK. thinks pravastatin casued a rash. Only tolerates crestor.  . Hypertensive heart disease   . IBS (irritable bowel syndrome)   . Obesity   . Osteoarthritis    knee; s/p TKR  . Osteoporosis   . Pancreatitis    hx  . PUD (peptic ulcer disease)   . Sleep apnea    diagnosed in 2011 , did not want CPAP -     Current Outpatient Medications on File Prior to Visit  Medication Sig Dispense Refill  . albuterol (PROAIR HFA) 108 (90 Base) MCG/ACT inhaler Inhale 2 puffs into the lungs every 4 (four) hours as needed for wheezing or shortness of breath. 1 Inhaler 1  . albuterol (PROVENTIL) (2.5 MG/3ML) 0.083% nebulizer solution Take 3 mLs (2.5 mg total) by nebulization every 6 (six) hours as needed for wheezing or shortness of breath. 270 mL 0  . clopidogrel (PLAVIX) 75 MG tablet Take 1 tablet (75 mg total) by mouth daily. 90 tablet 3  . pantoprazole (PROTONIX) 40 MG tablet Take 40 mg by mouth daily.  12  . telmisartan (MICARDIS) 80 MG  tablet Take 1 tablet (80 mg total) by mouth daily. 90 tablet 2  . acetaminophen (TYLENOL) 325 MG tablet Per bottle as needed    . budesonide-formoterol (SYMBICORT) 80-4.5 MCG/ACT inhaler Inhale 2 puffs into the lungs 2 (two) times daily. (Patient not taking: Reported on 06/14/2018) 1 Inhaler 11  . famotidine (PEPCID) 20 MG tablet famotidine 20 mg tablet  1 tablet by mouth daily    . hydrochlorothiazide (HYDRODIURIL) 25 MG tablet Take 1 tablet (25 mg total) by mouth daily. (Patient not taking: Reported on 06/14/2018) 90 tablet 1  . nitroGLYCERIN (NITROSTAT) 0.4 MG SL tablet Place 1 tablet (0.4 mg total) under the tongue every 5 (five) minutes as needed for chest pain. 100 tablet 1   No current facility-administered medications on file prior to visit.     Allergies  Allergen Reactions  . Aspirin Nausea  Only  . Clarithromycin Other (See Comments)    unknown  . Doxycycline Nausea Only  . Pravastatin Itching    REACTION: itching  . Levaquin [Levofloxacin In D5w] Rash    Rash, HA  . Penicillins Itching and Rash    Other reaction(s): Unknown Has patient had a PCN reaction causing immediate rash, facial/tongue/throat swelling, SOB or lightheadedness with hypotension: No Has patient had a PCN reaction causing severe rash involving mucus membranes or skin necrosis: No Has patient had a PCN reaction that required hospitalization No Has patient had a PCN reaction occurring within the last 10 years: No If all of the above answers are "NO", then may proceed with Cephalosporin use.     Assessment/Plan:  1. Hyperlipidemia - Patient LDL is not at goal of <70. We discussed different treatment options including trying another statin, PCSK9 inhibitors and bempedoic acid (in the future). Patient would like to retry rosuvastatin. We will try rosuvastatin 5mg  daily. Will recheck lipid panel in 2 months. If patient is tolerating well, will try to increase to 10mg  daily or consider adding Zetia.  Also had a discussion on diet, reducing or eliminating butter, using 1% or ideally skim milk and avoiding cheeses and fried foods. Lipid panel/LFT in 2 months followed by clinic visit to discuss further steps needed to lower cardiovascular risk.   Thank you,  Olene Floss, Pharm.D, BCPS [facility]  [address] [place], [place] [address]  Phone: [phone]; Fax: [phone]

## 2018-06-14 NOTE — Patient Instructions (Signed)
Start taking rosuvastatin (Crestor 5mg  daily)  We will check another lipid panel in 2 months.   Try using 1% or skim milk, limiting fried foods and cheeses.  Call us at [phone] with any questions or concerns

## 2018-06-18 DIAGNOSIS — H40033 Anatomical narrow angle, bilateral: Secondary | ICD-10-CM | POA: Diagnosis not present

## 2018-06-18 DIAGNOSIS — H2513 Age-related nuclear cataract, bilateral: Secondary | ICD-10-CM | POA: Diagnosis not present

## 2018-08-04 DIAGNOSIS — H43393 Other vitreous opacities, bilateral: Secondary | ICD-10-CM | POA: Diagnosis not present

## 2018-08-09 DIAGNOSIS — B0229 Other postherpetic nervous system involvement: Secondary | ICD-10-CM | POA: Diagnosis not present

## 2018-08-09 DIAGNOSIS — Z6833 Body mass index (BMI) 33.0-33.9, adult: Secondary | ICD-10-CM | POA: Diagnosis not present

## 2018-08-09 DIAGNOSIS — M545 Low back pain: Secondary | ICD-10-CM | POA: Diagnosis not present

## 2018-08-09 DIAGNOSIS — G4733 Obstructive sleep apnea (adult) (pediatric): Secondary | ICD-10-CM | POA: Diagnosis not present

## 2018-08-09 DIAGNOSIS — I2510 Atherosclerotic heart disease of native coronary artery without angina pectoris: Secondary | ICD-10-CM | POA: Diagnosis not present

## 2018-08-09 DIAGNOSIS — I10 Essential (primary) hypertension: Secondary | ICD-10-CM | POA: Diagnosis not present

## 2018-08-10 ENCOUNTER — Other Ambulatory Visit: Payer: PPO | Admitting: *Deleted

## 2018-08-10 DIAGNOSIS — E785 Hyperlipidemia, unspecified: Secondary | ICD-10-CM | POA: Diagnosis not present

## 2018-08-10 LAB — LIPID PANEL
Chol/HDL Ratio: 3.8 ratio (ref 0.0–4.4)
Cholesterol, Total: 139 mg/dL (ref 100–199)
HDL: 37 mg/dL — ABNORMAL LOW (ref >39)
LDL Calculated: 84 mg/dL (ref 0–99)
Triglycerides: 88 mg/dL (ref 0–149)
VLDL Cholesterol Cal: 18 mg/dL (ref 5–40)

## 2018-08-10 LAB — HEPATIC FUNCTION PANEL
ALT: 6 IU/L (ref 0–32)
AST: 11 IU/L (ref 0–40)
Albumin: 4.2 g/dL (ref 3.7–4.7)
Alkaline Phosphatase: 100 IU/L (ref 39–117)
Bilirubin Total: 0.4 mg/dL (ref 0.0–1.2)
Bilirubin, Direct: 0.11 mg/dL (ref 0.00–0.40)
Total Protein: 6.4 g/dL (ref 6.0–8.5)

## 2018-08-13 ENCOUNTER — Encounter: Payer: Self-pay | Admitting: Pharmacist

## 2018-08-13 ENCOUNTER — Telehealth: Payer: Self-pay | Admitting: Pharmacist

## 2018-08-13 ENCOUNTER — Ambulatory Visit (INDEPENDENT_AMBULATORY_CARE_PROVIDER_SITE_OTHER): Payer: PPO | Admitting: Pharmacist

## 2018-08-13 DIAGNOSIS — E785 Hyperlipidemia, unspecified: Secondary | ICD-10-CM | POA: Diagnosis not present

## 2018-08-13 MED ORDER — ROSUVASTATIN CALCIUM 5 MG PO TABS: 10.0000 mg | ORAL_TABLET | Freq: Every day | ORAL | 3 refills | Status: DC

## 2018-08-13 NOTE — Telephone Encounter (Signed)
Left message for patient to call back. LDL much improved on crestor 5mg  daily. However, LDL not at goal. Will need to discuss with patient how she is doing on low dose crestor and if she is willing to increase to 10mg  or add zetia.

## 2018-08-13 NOTE — Progress Notes (Signed)
Patient ID: ARRYN [name]                 DOB: 04/01/1936                    MRN: [id number]     HPI: [name] [name] is a 83 y.o. female patient referred to lipid clinic by Dr. Eldridge [name]. PMH is significant for multivessel CAD on cath in 2009 managed medically due to poor PCI and CABG targets. Also has hypertension, HLD, OSA not on CPAP, PACs, PVCs, mild aortic stenosis.   Patient has a history of myalgias and fatigue with statins. At her most recent visit she was restarted on rosuvastatin 5mg  daily which she has tolerated well. Denies muscle aches/pain today. Of note, patient reports seeing PCP recently and having elevated SBP in the 190s. Patient admits to having chest pain and feeling anxious during all doctors visits. Patient reports that blood pressure runs normal at home and wants to bring her cuff next visit to check its accuracy. Additionally, patient was instructed by PCP to cut out all starches and sugars from diet.   Current Medications: rosuvastatin 5mg  daily  Intolerances: atorvastatin, rosuvastatin (muscle pains), pravastatin (itching) Risk Factors: CAD LDL goal: <70   Diet: oatmeal, raisin brain, shredded wheat, eggs occassionally, [place] bacon, no pork, [place] and chicken-boiled/baked, cooks with olive oil, eats out twice a month, off starch as of March 2020  Exercise: does not exercise  Labs:  08/10/18:  TC 139, TG 88, HDL 37, LDL 84, LFT WNLs (rosuvastatin 5mg  daily) 05/22/18 TC 194, TG 79, HDL 47, LDL 131 (no therapy)  Past Medical History:  Diagnosis Date  . Arthritis   . CAD (coronary artery disease)    a. 02/2008 Cath: EF 60%, RCA 100 w/ L->R collats, LM 20d, RI 60-70, small, LAD 60/54m, 99d, D1 50, OM1 50. Poor distal targets for CABG/no good interventional target -> medical Rx; b. Myoview in [place] in 3/11 -nl;  c. 07/2011 MV: EF 67%, mild inf ischemia->Med Rx; 02/2015 MV: EF 50%, small, mod intensity apical and apical inf rev defect-->Med Rx.  . Chronic cough    hx;  ACEI stopped, possible contribution from GERD  . Depression   . Dyspnea    a. 08/2013 Echo: EF 60-65%, mild AS;  b. PFTs (10/10): FVC 103%, ratio 74%. mild obstruction; c. CT chest (6/11) no evidence for interstitial lung dz. possible asthma/upper airway cough sydrome. aspirin & beta blockers- potential causes for bronchospasm;  . GERD (gastroesophageal reflux disease)   . HBV (hepatitis B virus) infection   . HCV (hepatitis C virus)   . History of PAC's    hx  . History of PVC's    hx  . HLD (hyperlipidemia)    unable to tolerate most statins due to myalgias and elevated CK. thinks pravastatin casued a rash. Only tolerates crestor.  . Hypertensive heart disease   . IBS (irritable bowel syndrome)   . Obesity   . Osteoarthritis    knee; s/p TKR  . Osteoporosis   . Pancreatitis    hx  . PUD (peptic ulcer disease)   . Sleep apnea    diagnosed in 2011 , did not want CPAP -     Current Outpatient Medications on File Prior to Visit  Medication Sig Dispense Refill  . acetaminophen (TYLENOL) 325 MG tablet Per bottle as needed    . albuterol (PROAIR HFA) 108 (90 Base) MCG/ACT inhaler Inhale 2 puffs into the  lungs every 4 (four) hours as needed for wheezing or shortness of breath. 1 Inhaler 1  . albuterol (PROVENTIL) (2.5 MG/3ML) 0.083% nebulizer solution Take 3 mLs (2.5 mg total) by nebulization every 6 (six) hours as needed for wheezing or shortness of breath. 270 mL 0  . budesonide-formoterol (SYMBICORT) 80-4.5 MCG/ACT inhaler Inhale 2 puffs into the lungs 2 (two) times daily. (Patient not taking: Reported on 06/14/2018) 1 Inhaler 11  . clopidogrel (PLAVIX) 75 MG tablet Take 1 tablet (75 mg total) by mouth daily. 90 tablet 3  . famotidine (PEPCID) 20 MG tablet famotidine 20 mg tablet  1 tablet by mouth daily    . hydrochlorothiazide (HYDRODIURIL) 25 MG tablet Take 1 tablet (25 mg total) by mouth daily. (Patient not taking: Reported on 06/14/2018) 90 tablet 1  . nitroGLYCERIN (NITROSTAT) 0.4  MG SL tablet Place 1 tablet (0.4 mg total) under the tongue every 5 (five) minutes as needed for chest pain. 100 tablet 1  . pantoprazole (PROTONIX) 40 MG tablet Take 40 mg by mouth daily.  12  . rosuvastatin (CRESTOR) 5 MG tablet Take 1 tablet (5 mg total) by mouth daily. 30 tablet 3  . telmisartan (MICARDIS) 80 MG tablet Take 1 tablet (80 mg total) by mouth daily. 90 tablet 2   No current facility-administered medications on file prior to visit.     Allergies  Allergen Reactions  . Aspirin Nausea Only  . Clarithromycin Other (See Comments)    unknown  . Doxycycline Nausea Only  . Pravastatin Itching    REACTION: itching  . Levaquin [Levofloxacin In D5w] Rash    Rash, HA  . Penicillins Itching and Rash    Other reaction(s): Unknown Has patient had a PCN reaction causing immediate rash, facial/tongue/throat swelling, SOB or lightheadedness with hypotension: No Has patient had a PCN reaction causing severe rash involving mucus membranes or skin necrosis: No Has patient had a PCN reaction that required hospitalization No Has patient had a PCN reaction occurring within the last 10 years: No If all of the above answers are "NO", then may proceed with Cephalosporin use.     Assessment/Plan:  1. Hyperlipidemia - Patient LDL is not at goal of <70. LDL did significantly drop on low dose rosuvastatin without any intolerances. Discussed with patient her options of increasing the statin dose or adding Zetia to help get her to goal. Patient wishes to try rosuvastatin 10 mg daily first. HDL did decrease below goal of >40. Recommended to patient to exercise and add more green leafy vegetables to help increase HDL. Will follow-up in 3 months to assess efficacy and tolerance. Patient told to call clinic with any issues at the increased dose.   2. Hypertension - Patient's reported blood pressure is above goal of <130/80. Will compare home cuff and readings at next follow-up visit. No changes made to  antihypertensive regimen given suspicion of white coat HTN.   Patient seen with [name] [name], [facility] PharmD Candidate [phone].   Thank you, [name] [name]. [name] [name], PharmD  [facility]  [address] [place], [place] [address]  Phone: [phone]; Fax: [phone] 08/13/2018 8:19 AM

## 2018-08-13 NOTE — Patient Instructions (Signed)
INCREASE rosuvastatin to 10mg  (2 tablets) of your current supply   Call the office at [phone] if you have any questions or are unable to tolerate higher dose of rosuvastatin  Follow up with fasting labs in June and visit in office (if Dr. Eldridge Dace can see you in June you can cancel your appt with the pharmacist).   Cholesterol  Cholesterol is a fat. Your body needs a small amount of cholesterol. Cholesterol (plaque) may build up in your blood vessels (arteries). That makes you more likely to have a heart attack or stroke. You cannot feel your cholesterol level. Having a blood test is the only way to find out if your level is high. Keep your test results. Work with your doctor to keep your cholesterol at a good level. What do the results mean?  Total cholesterol is how much cholesterol is in your blood.  LDL is bad cholesterol. This is the type that can build up. Try to have low LDL.  HDL is good cholesterol. It cleans your blood vessels and carries LDL away. Try to have high HDL.  Triglycerides are fat that the body can store or burn for energy. What are good levels of cholesterol?  Total cholesterol below 200.  LDL below 100 is good for people who have health risks. LDL below 70 is good for people who have very high risks.  HDL above 40 is good. It is best to have HDL of 60 or higher.  Triglycerides below 150. How can I lower my cholesterol? Diet Follow your diet program as told by your doctor.  Choose fish, white meat chicken, or [place] that is roasted or baked. Try not to eat red meat, fried foods, sausage, or lunch meats.  Eat lots of fresh fruits and vegetables.  Choose whole grains, beans, pasta, potatoes, and cereals.  Choose olive oil, corn oil, or canola oil. Only use small amounts.  Try not to eat butter, mayonnaise, shortening, or palm kernel oils.  Try not to eat foods with trans fats.  Choose low-fat or nonfat dairy foods. ? Drink skim or nonfat  milk. ? Eat low-fat or nonfat yogurt and cheeses. ? Try not to drink whole milk or cream. ? Try not to eat ice cream, egg yolks, or full-fat cheeses.  Healthy desserts include angel food cake, ginger snaps, animal crackers, hard candy, popsicles, and low-fat or nonfat frozen yogurt. Try not to eat pastries, cakes, pies, and cookies.  Exercise Follow your exercise program as told by your doctor.  Be more active. Try gardening, walking, and taking the stairs.  Ask your doctor about ways that you can be more active. Medicine  Take over-the-counter and prescription medicines only as told by your doctor. This information is not intended to replace advice given to you by your health care provider. Make sure you discuss any questions you have with your health care provider. Document Released: 08/19/2008 Document Revised: 12/23/2015 Document Reviewed: 12/03/2015 Elsevier Interactive Patient Education  2019 [employer].

## 2018-08-25 ENCOUNTER — Other Ambulatory Visit: Payer: Self-pay | Admitting: Interventional Cardiology

## 2018-08-25 DIAGNOSIS — E785 Hyperlipidemia, unspecified: Secondary | ICD-10-CM

## 2018-09-06 ENCOUNTER — Telehealth: Payer: Self-pay | Admitting: Interventional Cardiology

## 2018-09-06 MED ORDER — EZETIMIBE 10 MG PO TABS: 10.0000 mg | ORAL_TABLET | Freq: Every day | ORAL | 11 refills | Status: DC

## 2018-09-06 NOTE — Telephone Encounter (Signed)
Pt seen in lipid clinic on 08/13/18 and was tolerating rosuvastatin 5mg  daily with LDL reduction from 131 to 84. Discussed at that time increasing rosuvastatin to 10mg  vs adding Zetia and pt wished to increase rosuvastatin dose.  Returned call to pt and advised her to decrease rosuvastatin back down to 5mg  daily which she previously tolerated. She will take a few days off of statin until her hand cramps disappear, then resume lower dose. Sent in rx for Zetia 10mg  daily and advised pt to start this in a few weeks once her cramping has disappeared. Will keep lab f/u scheduled in June and advised pt to call clinic with any concerns or intolerances before then.

## 2018-09-06 NOTE — Telephone Encounter (Signed)
°  Pt c/o medication issue:  1. Name of Medication: rosuvastatin (CRESTOR) 5 MG tablet  2. How are you currently taking this medication (dosage and times per day)? As written 3. Are you having a reaction (difficulty breathing--STAT)? no  4. What is your medication issue? Joint pain

## 2018-09-21 DIAGNOSIS — M48061 Spinal stenosis, lumbar region without neurogenic claudication: Secondary | ICD-10-CM | POA: Diagnosis not present

## 2018-09-21 DIAGNOSIS — M25522 Pain in left elbow: Secondary | ICD-10-CM | POA: Diagnosis not present

## 2018-10-18 ENCOUNTER — Other Ambulatory Visit: Payer: Self-pay | Admitting: Internal Medicine

## 2018-10-18 DIAGNOSIS — Z1231 Encounter for screening mammogram for malignant neoplasm of breast: Secondary | ICD-10-CM

## 2018-10-19 DIAGNOSIS — M48061 Spinal stenosis, lumbar region without neurogenic claudication: Secondary | ICD-10-CM | POA: Diagnosis not present

## 2018-10-23 ENCOUNTER — Other Ambulatory Visit: Payer: Self-pay

## 2018-10-23 MED ORDER — TELMISARTAN 80 MG PO TABS: 80.0000 mg | ORAL_TABLET | Freq: Every day | ORAL | 3 refills | Status: DC

## 2018-11-02 ENCOUNTER — Telehealth: Payer: Self-pay

## 2018-11-02 NOTE — Telephone Encounter (Signed)
Virtual Visit Pre-Appointment Phone Call  TELEPHONE CALL NOTE  [name] [name] has been deemed a candidate for a follow-up tele-health visit to limit community exposure during the Covid-19 pandemic. I spoke with the patient via phone to ensure availability of phone/video source, confirm preferred email & phone number, and discuss instructions and expectations.  I reminded [name] [name] to be prepared with any vital sign and/or heart rhythm information that could potentially be obtained via home monitoring, at the time of her visit. I reminded [name] [name] to expect a phone call prior to her visit.  Patient agrees to consent below.  Lattie Haw, RN 11/02/2018 10:49 AM   FULL LENGTH CONSENT FOR TELE-HEALTH VISIT   I hereby voluntarily request, consent and authorize [facility] and its employed or contracted physicians, physician assistants, nurse practitioners or other licensed health care professionals (the Practitioner), to provide me with telemedicine health care services (the "Services") as deemed necessary by the treating Practitioner. I acknowledge and consent to receive the Services by the Practitioner via telemedicine. I understand that the telemedicine visit will involve communicating with the Practitioner through live audiovisual communication technology and the disclosure of certain medical information by electronic transmission. I acknowledge that I have been given the opportunity to request an in-person assessment or other available alternative prior to the telemedicine visit and am voluntarily participating in the telemedicine visit.  I understand that I have the right to withhold or withdraw my consent to the use of telemedicine in the course of my care at any time, without affecting my right to future care or treatment, and that the Practitioner or I may terminate the telemedicine visit at any time. I understand that I have the right to inspect all information  obtained and/or recorded in the course of the telemedicine visit and may receive copies of available information for a reasonable fee.  I understand that some of the potential risks of receiving the Services via telemedicine include:  Marland Kitchen Delay or interruption in medical evaluation due to technological equipment failure or disruption; . Information transmitted may not be sufficient (e.g. poor resolution of images) to allow for appropriate medical decision making by the Practitioner; and/or  . In rare instances, security protocols could fail, causing a breach of personal health information.  Furthermore, I acknowledge that it is my responsibility to provide information about my medical history, conditions and care that is complete and accurate to the best of my ability. I acknowledge that Practitioner's advice, recommendations, and/or decision may be based on factors not within their control, such as incomplete or inaccurate data provided by me or distortions of diagnostic images or specimens that may result from electronic transmissions. I understand that the practice of medicine is not an exact science and that Practitioner makes no warranties or guarantees regarding treatment outcomes. I acknowledge that I will receive a copy of this consent concurrently upon execution via email to the email address I last provided but may also request a printed copy by calling the office of [facility].    I understand that my insurance will be billed for this visit.   I have read or had this consent read to me. . I understand the contents of this consent, which adequately explains the benefits and risks of the Services being provided via telemedicine.  . I have been provided ample opportunity to ask questions regarding this consent and the Services and have had my questions answered to my satisfaction. Marland Kitchen I  give my informed consent for the services to be provided through the use of telemedicine in my medical care   By participating in this telemedicine visit I agree to the above.

## 2018-11-05 ENCOUNTER — Other Ambulatory Visit: Payer: Self-pay

## 2018-11-05 ENCOUNTER — Encounter: Payer: Self-pay | Admitting: Interventional Cardiology

## 2018-11-05 ENCOUNTER — Telehealth (INDEPENDENT_AMBULATORY_CARE_PROVIDER_SITE_OTHER): Payer: PPO | Admitting: Interventional Cardiology

## 2018-11-05 ENCOUNTER — Telehealth: Payer: Self-pay

## 2018-11-05 VITALS — Ht 65.0 in | Wt 175.0 lb

## 2018-11-05 DIAGNOSIS — E785 Hyperlipidemia, unspecified: Secondary | ICD-10-CM

## 2018-11-05 DIAGNOSIS — I350 Nonrheumatic aortic (valve) stenosis: Secondary | ICD-10-CM | POA: Diagnosis not present

## 2018-11-05 DIAGNOSIS — I2510 Atherosclerotic heart disease of native coronary artery without angina pectoris: Secondary | ICD-10-CM | POA: Diagnosis not present

## 2018-11-05 DIAGNOSIS — I10 Essential (primary) hypertension: Secondary | ICD-10-CM

## 2018-11-05 MED ORDER — EZETIMIBE 10 MG PO TABS: 10.0000 mg | ORAL_TABLET | Freq: Every day | ORAL | 11 refills | Status: DC

## 2018-11-05 NOTE — Telephone Encounter (Signed)
    COVID-19 Pre-Screening Questions:  . In the past 7 to 10 days have you had a cough,  shortness of breath, headache, congestion, fever (100 or greater) body aches, chills, sore throat, or sudden loss of taste or sense of smell? . Have you been around anyone with known Covid 19. . Have you been around anyone who is awaiting Covid 19 test results in the past 7 to 10 days? . Have you been around anyone who has been exposed to Covid 19, or has mentioned symptoms of Covid 19 within the past 7 to 10 days?  If you have any concerns/questions about symptoms patients report during screening (either on the phone or at threshold). Contact the provider seeing the patient or DOD for further guidance.  If neither are available contact a member of the leadership team.      Pt answered NO to all pre screening questions. [phone] 11/05/2018

## 2018-11-05 NOTE — Patient Instructions (Signed)
Medication Instructions:  Your physician recommends that you continue on your current medications as directed. Please refer to the Current Medication list given to you today.  TAKE: ezetimibe (zetia) 10 mg once a day  If you need a refill on your cardiac medications before your next appointment, please call your pharmacy.   Lab work: Your physician recommends that you return for a FASTING lipid profile and complete metabolic panel on 01/07/19   If you have labs (blood work) drawn today and your tests are completely normal, you will receive your results only by: Marland Kitchen MyChart Message (if you have MyChart) OR . A paper copy in the mail If you have any lab test that is abnormal or we need to change your treatment, we will call you to review the results.  Testing/Procedures: None ordered  Follow-Up: At [facility], you and your health needs are our priority.  As part of our continuing mission to provide you with exceptional heart care, we have created designated Provider Care Teams.  These Care Teams include your primary Cardiologist (physician) and Advanced Practice Providers (APPs -  Physician Assistants and Nurse Practitioners) who all work together to provide you with the care you need, when you need it. . You will need a follow up appointment in 6 months.  Please call our office 2 months in advance to schedule this appointment.  You may see Everette Rank, MD or one of the following Advanced Practice Providers on your designated Care Team:   . Robbie Lis, PA-C . Dayna Dunn, PA-C . Jacolyn Reedy, PA-C  Any Other Special Instructions Will Be Listed Below (If Applicable).  1. Monitor your Blood Pressure at home  2. Your provider recommends that you maintain 150 minutes per week of moderate aerobic activity.

## 2018-11-05 NOTE — Progress Notes (Signed)
Virtual Visit via Video Note   This visit type was conducted due to national recommendations for restrictions regarding the COVID-19 Pandemic (e.g. social distancing) in an effort to limit this patient's exposure and mitigate transmission in our community.  Due to her co-morbid illnesses, this patient is at least at moderate risk for complications without adequate follow up.  This format is felt to be most appropriate for this patient at this time.  All issues noted in this document were discussed and addressed.  A limited physical exam was performed with this format.  Please refer to the patient's chart for her consent to telehealth for [facility].   Date:  11/05/2018   ID:  [name] [name], DOB [birth year], MRN [id number]  Patient Location: Home Provider Location: Home  PCP:  Jarome Matin (Inactive)  Cardiologist:  Lance Muss, MD  Electrophysiologist:  None   Evaluation Performed:  Follow-Up Visit  Chief Complaint:  CAD  History of Present Illness:    [name] [name] is a 83 y.o. female with history of multivessel CAD on cath in 2009 managed medically due to poor PCI and CABG targets. Also has hypertension, HLD, OSA not on CPAP, PACs, PVCs, mild aortic stenosis.  She was seen by Dr. Okey Dupre in the past.  Myalgias have been an issue on statins.  She has been on and off statins.  It was noted in 10/19: "She was having myalgias and fatigue was concerned it was secondary to statins. He agreed to 1 month off of statins if her symptoms improve refer for PCSK9 inhibitor therapy. LDL was at 80 and not at goal. If her symptoms do not improve with 1 month off he recommended increasing her rosuvastatin to 40 mg daily."  She did not start ezetemibe.    She has not been walking that much.  Denies : Chest pain. Dizziness. Leg edema. Nitroglycerin use. Orthopnea. Palpitations. Paroxysmal nocturnal dyspnea. Shortness of breath. Syncope.    The patient does not have symptoms  concerning for COVID-19 infection (fever, chills, cough, or new shortness of breath).    Past Medical History:  Diagnosis Date   Arthritis    CAD (coronary artery disease)    a. 02/2008 Cath: EF 60%, RCA 100 w/ L->R collats, LM 20d, RI 60-70, small, LAD 60/64m, 99d, D1 50, OM1 50. Poor distal targets for CABG/no good interventional target -> medical Rx; b. Myoview in [place] in 3/11 -nl;  c. 07/2011 MV: EF 67%, mild inf ischemia->Med Rx; 02/2015 MV: EF 50%, small, mod intensity apical and apical inf rev defect-->Med Rx.   Chronic cough    hx; ACEI stopped, possible contribution from GERD   Depression    Dyspnea    a. 08/2013 Echo: EF 60-65%, mild AS;  b. PFTs (10/10): FVC 103%, ratio 74%. mild obstruction; c. CT chest (6/11) no evidence for interstitial lung dz. possible asthma/upper airway cough sydrome. aspirin & beta blockers- potential causes for bronchospasm;   GERD (gastroesophageal reflux disease)    HBV (hepatitis B virus) infection    HCV (hepatitis C virus)    History of PAC's    hx   History of PVC's    hx   HLD (hyperlipidemia)    unable to tolerate most statins due to myalgias and elevated CK. thinks pravastatin casued a rash. Only tolerates crestor.   Hypertensive heart disease    IBS (irritable bowel syndrome)    Obesity    Osteoarthritis    knee; s/p TKR   Osteoporosis  Pancreatitis    hx   PUD (peptic ulcer disease)    Sleep apnea    diagnosed in 2011 , did not want CPAP -    Past Surgical History:  Procedure Laterality Date   BREAST EXCISIONAL BIOPSY Right 1990   calcification of right breast-lumps Right 1998   CARDIAC CATHETERIZATION  2009   CHOLECYSTECTOMY     hyesterectomy, unspec. area  1998   pancreatic duct stent     TUBAL LIGATION  1995      Current Meds  Medication Sig   acetaminophen (TYLENOL) 325 MG tablet Per bottle as needed   albuterol (PROAIR HFA) 108 (90 Base) MCG/ACT inhaler Inhale 2 puffs into the lungs every 4  (four) hours as needed for wheezing or shortness of breath.   albuterol (PROVENTIL) (2.5 MG/3ML) 0.083% nebulizer solution Take 3 mLs (2.5 mg total) by nebulization every 6 (six) hours as needed for wheezing or shortness of breath.   budesonide-formoterol (SYMBICORT) 80-4.5 MCG/ACT inhaler Inhale 2 puffs into the lungs 2 (two) times daily.   clopidogrel (PLAVIX) 75 MG tablet Take 1 tablet (75 mg total) by mouth daily.   ezetimibe (ZETIA) 10 MG tablet Take 1 tablet (10 mg total) by mouth daily.   famotidine (PEPCID) 20 MG tablet famotidine 20 mg tablet  1 tablet by mouth daily   hydrochlorothiazide (HYDRODIURIL) 25 MG tablet Take 1 tablet (25 mg total) by mouth daily.   nitroGLYCERIN (NITROSTAT) 0.4 MG SL tablet Place 1 tablet (0.4 mg total) under the tongue every 5 (five) minutes as needed for chest pain.   pantoprazole (PROTONIX) 40 MG tablet Take 40 mg by mouth daily.   rosuvastatin (CRESTOR) 5 MG tablet TAKE 1 TABLET BY MOUTH EVERY DAY   telmisartan (MICARDIS) 80 MG tablet Take 1 tablet (80 mg total) by mouth daily.     Allergies:   Aspirin; Clarithromycin; Doxycycline; Pravastatin; Levaquin [levofloxacin in d5w]; and Penicillins   Social History   Tobacco Use   Smoking status: Never Smoker   Smokeless tobacco: Never Used   Tobacco comment: 2nd hand exposure x40 years   Substance Use Topics   Alcohol use: No   Drug use: No     Family Hx: The patient's family history includes Arthritis/Rheumatoid in her mother; Asthma in her mother and sister; COPD in her sister; Colon cancer in her maternal aunt; Emphysema in her sister; Liver disease in her father; Lung cancer in her maternal aunt; Pancreatic cancer in her brother; Stroke in her mother and sister.  ROS:   Please see the history of present illness.    Transient muscle aches All other systems reviewed and are negative.   Prior CV studies:   The following studies were reviewed today:    Labs/Other Tests and  Data Reviewed:    EKG:  An ECG dated 12/19 was personally reviewed today and demonstrated:  NSR, no ST changes  Recent Labs: 08/10/2018: ALT 6   Recent Lipid Panel Lab Results  Component Value Date/Time   CHOL 139 08/10/2018 09:42 AM   TRIG 88 08/10/2018 09:42 AM   HDL 37 (L) 08/10/2018 09:42 AM   CHOLHDL 3.8 08/10/2018 09:42 AM   CHOLHDL 2.0 06/16/2015 08:00 AM   LDLCALC 84 08/10/2018 09:42 AM    Wt Readings from Last 3 Encounters:  11/05/18 175 lb (79.4 kg)  05/24/18 175 lb 9.6 oz (79.7 kg)  02/20/18 177 lb 9.6 oz (80.6 kg)     Objective:    Vital Signs:  Ht 5\' 5"  (1.651 m)    Wt 175 lb (79.4 kg)    BMI 29.12 kg/m    VITAL SIGNS:  reviewed GEN:  no acute distress RESPIRATORY:  no shortness of breath PSYCH:  normal affect exam limited by phone format  ASSESSMENT & PLAN:    1. CAD: Angina controlled with medical therapy.   2. Hyperlipidemia: Start Zetia 10 mg daily.  Continue Crestor.  Recheck labs in a few months. Cancel labs for tomorrow.  3. HTN: She will check BP at home.  4. No sx of severe aortic stenosis.  Previously mild.   COVID-19 Education: The signs and symptoms of COVID-19 were discussed with the patient and how to seek care for testing (follow up with PCP or arrange E-visit).  The importance of social distancing was discussed today.  Time:   Today, I have spent 15 minutes with the patient with telehealth technology discussing the above problems.     Medication Adjustments/Labs and Tests Ordered: Current medicines are reviewed at length with the patient today.  Concerns regarding medicines are outlined above.   Tests Ordered: No orders of the defined types were placed in this encounter.   Medication Changes: No orders of the defined types were placed in this encounter.   Disposition:  Follow up in 6 months  Signed, Lance Muss, MD  11/05/2018 4:32 PM    [facility]

## 2018-11-06 ENCOUNTER — Other Ambulatory Visit: Payer: PPO

## 2018-11-09 ENCOUNTER — Ambulatory Visit: Payer: PPO | Admitting: Interventional Cardiology

## 2018-11-09 ENCOUNTER — Other Ambulatory Visit: Payer: PPO

## 2018-11-10 ENCOUNTER — Other Ambulatory Visit: Payer: Self-pay

## 2018-11-10 ENCOUNTER — Ambulatory Visit
Admission: RE | Admit: 2018-11-10 | Discharge: 2018-11-10 | Disposition: A | Discharge status: Home / Self Care | Payer: PPO | Source: Ambulatory Visit | Attending: Internal Medicine | Admitting: Internal Medicine

## 2018-11-10 DIAGNOSIS — Z1231 Encounter for screening mammogram for malignant neoplasm of breast: Secondary | ICD-10-CM | POA: Diagnosis not present

## 2018-11-12 ENCOUNTER — Ambulatory Visit: Payer: PPO

## 2018-12-14 DIAGNOSIS — I10 Essential (primary) hypertension: Secondary | ICD-10-CM | POA: Diagnosis not present

## 2018-12-14 DIAGNOSIS — M859 Disorder of bone density and structure, unspecified: Secondary | ICD-10-CM | POA: Diagnosis not present

## 2018-12-14 DIAGNOSIS — E7849 Other hyperlipidemia: Secondary | ICD-10-CM | POA: Diagnosis not present

## 2018-12-14 DIAGNOSIS — R82998 Other abnormal findings in urine: Secondary | ICD-10-CM | POA: Diagnosis not present

## 2018-12-21 DIAGNOSIS — L299 Pruritus, unspecified: Secondary | ICD-10-CM | POA: Diagnosis not present

## 2018-12-21 DIAGNOSIS — Z0000 Encounter for general adult medical examination without abnormal findings: Secondary | ICD-10-CM | POA: Diagnosis not present

## 2018-12-21 DIAGNOSIS — I2510 Atherosclerotic heart disease of native coronary artery without angina pectoris: Secondary | ICD-10-CM | POA: Diagnosis not present

## 2018-12-21 DIAGNOSIS — I872 Venous insufficiency (chronic) (peripheral): Secondary | ICD-10-CM | POA: Diagnosis not present

## 2018-12-21 DIAGNOSIS — I10 Essential (primary) hypertension: Secondary | ICD-10-CM | POA: Diagnosis not present

## 2018-12-21 DIAGNOSIS — Z658 Other specified problems related to psychosocial circumstances: Secondary | ICD-10-CM | POA: Diagnosis not present

## 2018-12-21 DIAGNOSIS — J45998 Other asthma: Secondary | ICD-10-CM | POA: Diagnosis not present

## 2018-12-21 DIAGNOSIS — B0229 Other postherpetic nervous system involvement: Secondary | ICD-10-CM | POA: Diagnosis not present

## 2018-12-21 DIAGNOSIS — M545 Low back pain: Secondary | ICD-10-CM | POA: Diagnosis not present

## 2018-12-21 DIAGNOSIS — E559 Vitamin D deficiency, unspecified: Secondary | ICD-10-CM | POA: Diagnosis not present

## 2018-12-21 DIAGNOSIS — E785 Hyperlipidemia, unspecified: Secondary | ICD-10-CM | POA: Diagnosis not present

## 2018-12-21 DIAGNOSIS — G4733 Obstructive sleep apnea (adult) (pediatric): Secondary | ICD-10-CM | POA: Diagnosis not present

## 2019-01-07 ENCOUNTER — Other Ambulatory Visit: Payer: PPO

## 2019-01-23 DIAGNOSIS — H35041 Retinal micro-aneurysms, unspecified, right eye: Secondary | ICD-10-CM | POA: Diagnosis not present

## 2019-01-23 DIAGNOSIS — H2512 Age-related nuclear cataract, left eye: Secondary | ICD-10-CM | POA: Diagnosis not present

## 2019-01-23 DIAGNOSIS — H35351 Cystoid macular degeneration, right eye: Secondary | ICD-10-CM | POA: Diagnosis not present

## 2019-01-23 DIAGNOSIS — H2511 Age-related nuclear cataract, right eye: Secondary | ICD-10-CM | POA: Diagnosis not present

## 2019-01-23 DIAGNOSIS — H34811 Central retinal vein occlusion, right eye, with macular edema: Secondary | ICD-10-CM | POA: Diagnosis not present

## 2019-02-04 DIAGNOSIS — H35042 Retinal micro-aneurysms, unspecified, left eye: Secondary | ICD-10-CM | POA: Diagnosis not present

## 2019-02-04 DIAGNOSIS — H34811 Central retinal vein occlusion, right eye, with macular edema: Secondary | ICD-10-CM | POA: Diagnosis not present

## 2019-02-04 DIAGNOSIS — H35041 Retinal micro-aneurysms, unspecified, right eye: Secondary | ICD-10-CM | POA: Diagnosis not present

## 2019-02-18 NOTE — Progress Notes (Signed)
Cardiology Office Note:    Date:  02/19/2019   ID:  [name] MaximRuth E Punches, DOB 02/21/1936, MRN [id number]  PCP:  Jarome MatinPaterson, Daniel (Inactive)  Cardiologist:  Lance MussJayadeep Varanasi, MD  Electrophysiologist:  None   Referring MD: No ref. provider found   Chief Complaint: muscle aches, fatigue, and palpitations  History of Present Illness:    [name] [name] is a 83 y.o. female with a history of multivessel CAD on cardiac catheterization in 2009 managed medically due to poor PCI and CABG targets, mild aortic stenosis, hypertension, hyperlipidemia, obstructive sleep apnea on CPAP, PACs/PVCs, rheumatoid arthritis, hepatitis B and C, and IBS who was previously seen by Dr. Okey DupreEnd but now follows with Dr. Eldridge DaceVaranasi. Patient has not tolerated Imdur in the past. She has also had myalgias and fatigue with statins, specifically Lipitor and high dose Crestor.   Patient was last seen by Dr. Eldridge DaceVaranasi on 11/05/2018 via telemedicine visit at which time she denied any cardiac symptoms. Zetia was added to Crestor 5mg  daily at that time. She was instructed to follow-up in 6 months.  Patient presents today for evaluation of palpitations, fatigue, and muscle aches. Per medication list, she is now on Crestor 20mg  daily and Zetia 10mg  daily. I cannot tell when Crestor was increased. However, she reports that she has again started having diffuse muscle/joint aches and significant fatigue. This got so bad that she stopped taking all of her medications on Sunday and symptoms improved. She also reports significant stress at home taking care of her husband with significant dementia. She reports palpitations when she is stressed or when someone says something that makes her emotional. She does not seem to have palpitations any other time. No lightheadedness or dizziness. No chest pain. She notes occasional episodes of shortness of breath but again states this only occurs when she is stressed. No orthopnea or PND. She notes some swelling  around left lateral ankle. No recent injury. She also thinks she has another shingles outbreak on her right upper extremity and reports continued numbness/pain along from prior episode of shingles.  Past Medical History:  Diagnosis Date   Arthritis    CAD (coronary artery disease)    a. 02/2008 Cath: EF 60%, RCA 100 w/ L->R collats, LM 20d, RI 60-70, small, LAD 60/6441m, 99d, D1 50, OM1 50. Poor distal targets for CABG/no good interventional target -> medical Rx; b. Myoview in [place] in 3/11 -nl;  c. 07/2011 MV: EF 67%, mild inf ischemia->Med Rx; 02/2015 MV: EF 50%, small, mod intensity apical and apical inf rev defect-->Med Rx.   Chronic cough    hx; ACEI stopped, possible contribution from GERD   Depression    Dyspnea    a. 08/2013 Echo: EF 60-65%, mild AS;  b. PFTs (10/10): FVC 103%, ratio 74%. mild obstruction; c. CT chest (6/11) no evidence for interstitial lung dz. possible asthma/upper airway cough sydrome. aspirin & beta blockers- potential causes for bronchospasm;   GERD (gastroesophageal reflux disease)    HBV (hepatitis B virus) infection    HCV (hepatitis C virus)    History of PAC's    hx   History of PVC's    hx   HLD (hyperlipidemia)    unable to tolerate most statins due to myalgias and elevated CK. thinks pravastatin casued a rash. Only tolerates crestor.   Hypertensive heart disease    IBS (irritable bowel syndrome)    Obesity    Osteoarthritis    knee; s/p TKR   Osteoporosis  Pancreatitis    hx   PUD (peptic ulcer disease)    Sleep apnea    diagnosed in 2011 , did not want CPAP -     Past Surgical History:  Procedure Laterality Date   BREAST EXCISIONAL BIOPSY Right 1990   calcification of right breast-lumps Right 1998   CARDIAC CATHETERIZATION  2009   CHOLECYSTECTOMY     hyesterectomy, unspec. area  1998   pancreatic duct stent     TUBAL LIGATION  1995     Current Medications: Current Meds  Medication Sig   acetaminophen (TYLENOL)  325 MG tablet Per bottle as needed   albuterol (PROAIR HFA) 108 (90 Base) MCG/ACT inhaler Inhale 2 puffs into the lungs every 4 (four) hours as needed for wheezing or shortness of breath.   albuterol (PROVENTIL) (2.5 MG/3ML) 0.083% nebulizer solution Take 3 mLs (2.5 mg total) by nebulization every 6 (six) hours as needed for wheezing or shortness of breath.   budesonide-formoterol (SYMBICORT) 80-4.5 MCG/ACT inhaler Inhale 2 puffs into the lungs 2 (two) times daily.   clopidogrel (PLAVIX) 75 MG tablet Take 1 tablet (75 mg total) by mouth daily.   ezetimibe (ZETIA) 10 MG tablet Take 1 tablet (10 mg total) by mouth daily.   gabapentin (NEURONTIN) 100 MG capsule Take 100 mg by mouth 3 (three) times daily.   hydrochlorothiazide (HYDRODIURIL) 25 MG tablet Take 1 tablet (25 mg total) by mouth daily.   LINZESS 145 MCG CAPS capsule Take 145 mcg by mouth as needed.   nitroGLYCERIN (NITROSTAT) 0.4 MG SL tablet Place 1 tablet (0.4 mg total) under the tongue every 5 (five) minutes as needed for chest pain.   pantoprazole (PROTONIX) 40 MG tablet Take 40 mg by mouth daily.   telmisartan (MICARDIS) 80 MG tablet Take 1 tablet (80 mg total) by mouth daily.   Vitamin D, Ergocalciferol, (DRISDOL) 1.25 MG (50000 UT) CAPS capsule Use as directed   [DISCONTINUED] rosuvastatin (CRESTOR) 20 MG tablet Take 20 mg by mouth daily.     Allergies:   Aspirin, Clarithromycin, Doxycycline, Pravastatin, Levaquin [levofloxacin in d5w], and Penicillins   Social History   Socioeconomic History   Marital status: Married    Spouse name: Lollie Sails   Number of children: 4   Years of education: 12   Highest education level: Not on file  Occupational History   Occupation: retired     Associate Professor: RETIRED    Comment: Insurance underwriter strain: Not on file   Food insecurity    Worry: Not on file    Inability: Not on Occupational hygienist needs    Medical: Not on file     Non-medical: Not on file  Tobacco Use   Smoking status: Never Smoker   Smokeless tobacco: Never Used   Tobacco comment: 2nd hand exposure x40 years   Substance and Sexual Activity   Alcohol use: No   Drug use: No   Sexual activity: Not Currently  Lifestyle   Physical activity    Days per week: Not on file    Minutes per session: Not on file   Stress: Not on file  Relationships   Social connections    Talks on phone: Not on file    Gets together: Not on file    Attends religious service: Not on file    Active member of club or organization: Not on file    Attends meetings of clubs or organizations: Not on file  Relationship status: Not on file  Other Topics Concern   Not on file  Social History Narrative   Married, lives with husband; has children.   Retired Materials engineer.       Cell # [phone]; okay to leave a message.         Family History: The patient's family history includes Arthritis/Rheumatoid in her mother; Asthma in her mother and sister; COPD in her sister; Colon cancer in her maternal aunt; Emphysema in her sister; Liver disease in her father; Lung cancer in her maternal aunt; Pancreatic cancer in her brother; Stroke in her mother and sister.  ROS:   Please see the history of present illness.    All other systems reviewed and are negative.  EKGs/Labs/Other Studies Reviewed:    The following studies were reviewed today: Echocardiogram 08/05/2016: Study Conclusions: - Left ventricle: The cavity size was normal. There was mild focal   basal hypertrophy of the septum. Systolic function was normal.   The estimated ejection fraction was in the range of 60% to 65%.   Wall motion was normal; there were no regional wall motion   abnormalities. Doppler parameters are consistent with abnormal   left ventricular relaxation (grade 1 diastolic dysfunction). - Aortic valve: There was very mild stenosis. _______________  Myoview 02/19/2015:  The  left ventricular ejection fraction is mildly decreased (45-54%).  Nuclear stress EF: 50%.  There was no ST segment deviation noted during stress.  No T wave inversion was noted during stress.  Defect 1: There is a small defect of moderate severity.   There is a small size, moderate intensity reversible apical and apical inferior wall perfusion defect. This may represent ischemia (SDS 5). There is underlying inferior bowel attenuation and the calculated LVEF is 50% with inferolateral wall hypokinesis. This is an intermediate risk study. Clinical correlation is advised. _______________  Cardiac Catheterization Findings:  1. Hemodynamics.  LV 127/7/16, aorta 125/52/87.  2. Left ventriculography:  EF is 60%, the basal to mid inferior wall      is hypokinetic.  There is no significant gradient on withdrawal      from the left ventricle to the aorta.  3. Coronary angiography.  The coronary system is right-dominant.  The      RCA is totally occluded proximally.  There are left-to-right      collaterals that fill the distal RCA.  The RCA is fairly well      collateralized.  The distal left main has a 20% narrowing.  There      is a small ramus intermedius with a 60-70% stenosis in the      midportion of the vessel.  The LAD has serial 70% and 60% stenoses      in the midportion of the vessel.  The distal LAD is subtotally      occluded.  The first diagonal has approximately 50% stenosis in its      midportion.  The circumflex has luminal irregularities and the      first obtuse marginal off the circumflex has a 50% midvessel      stenosis.  Assessment:  This is a 83 year old with significant three-vessel  coronary artery disease.  She does not have any critical stenoses in her  proximal left main, her left circumflex, or her proximal LAD.  There are  moderate stenoses in the mid LAD and the distal LAD is subtotally  occluded.  The RCA is totally occluded proximally.  There are good  collaterals from the left to right supplying the distal LAD territory.  Her ejection fraction is preserved.  After nitroglycerin was given,  there was minimal change in the appearance of the LAD lesions.  I do not  think that this patient is a bypass candidate as she would have very  poor distal targets.  I do not see any discrete lesion for which PCI  would be helpful to her.  I think at this time we will plan on medical  management of her angina pectoris.  I will review her films with our  interventional cardiologist.  _______________  EKG:  EKG is ordered today. EKG personally reviewed and demonstrates normal sinus rhythm, rate 68 bpm, with 1st degree AV block, T wave inversions in leads III and AVF, and non-specific ST/T changes in leads V4-V6. No significant changes from prior tracing in 07/2017.   Recent Labs: 08/10/2018: ALT 6  Recent Lipid Panel    Component Value Date/Time   CHOL 139 08/10/2018 0942   TRIG 88 08/10/2018 0942   HDL 37 (L) 08/10/2018 0942   CHOLHDL 3.8 08/10/2018 0942   CHOLHDL 2.0 06/16/2015 0800   VLDL 12 06/16/2015 0800   LDLCALC 84 08/10/2018 0942    Physical Exam:    Vital Signs:  BP (!) 148/78    Pulse 68    Ht 5\' 5"  (1.651 m)    Wt 177 lb (80.3 kg)    SpO2 99%    BMI 29.45 kg/m     Wt Readings from Last 3 Encounters:  02/19/19 177 lb (80.3 kg)  11/05/18 175 lb (79.4 kg)  05/24/18 175 lb 9.6 oz (79.7 kg)    General: 83 y.o. African-American female resting comfortably in no acute distress. HEENT: Normocephalic and atraumatic. Sclera clear. EOMs intact. Neck: Supple. No carotid bruits. No JVD. Heart: RRR. Distinct S1 and S2. II/VI systolic murmur. No gallops or rubs. Lungs: No increased work of breathing. Clear to ausculation bilaterally. No wheezes, rhonchi, or rales.  Abdomen: Soft, non-distended, and non-tender to palpation. Bowel sounds present.  MSK: Normal strength and tone for age. Extremities: Mild nonpitting edema of lateral left ankle. No  bony tenderness. No erythema or warmth to the touch. Skin: Warm and dry. Neuro: Alert and oriented x3. No focal deficits. Psych: Normal affect. Responds appropriately.  ASSESSMENT:    1. Palpitations   2. Coronary artery disease involving native coronary artery of native heart without angina pectoris   3. Mild aortic stenosis   4. Essential hypertension   5. Hyperlipidemia, unspecified hyperlipidemia type   6. Edema of left ankle   7. History of shingles    PLAN:    Palpitations  - Patient reports palpitations but only when stressed/anxious. Patient under a lot of stress at home as she is primary care give to husband with significant dementia.  - EKG today shows normal sinus rhythm with rate of 68 bpm and now premature beats.  - Do not think monitor is needed at this time. She does have known history of PVCs/PACs but suspect this is just secondary to stress/anxiety. Recommended discussing this with PCP at appointment later this week. Advised patient to notify us if she has palpitations when she is not stress/anxious or if she has lightheadedness or dizziness with them.  Multivessel CAD - Cardiac catheterization in 2009 showed multivessel CAD with no good PCI or CABG targets. Therefore, medical therapy was recommended. - Stable. No chest pain.  - Continue single anti-platelet therapy with Plavix 75mg   daily given Aspirin allergy. Of note, patient states she is able to take baby Aspirin but cannot take higher doses).  Mild Aortic Stenosis - Most recent Echo from 08/2016 showed LVEF of 60-65%, grade 1 diastolic dysfunction, normal wall motion, and very mild aortic stenosis with mean gradient of 10 mmHg and peak gradient of 18 mmHg. - Mild systolic murmur noted on exam.  - Consider rechecking Echo in 2021 to 2023.  Hypertension - BP mildly elevated at 148/78 today; however, she has not taken her medications over the last 2 days because of her fatigue and myalgia.  - Advised patient to  Telmisartan 80mg  and HCTZ 25mg  daily as I do not think that is causing her symptoms. Will recheck BMET today as it does not seem she has had creatinine and potassium checked since 2018.  Hyperlipidemia - Most recent lipid panel from 12/14/2018: Total Cholesterol 121, Triglycerides 62, HDL 36, LDL 73.  - Slightly above LDL goal of 70. - Currently on Crestor 20mg  daily and Zetia 10mg  daily. However, patient stopped taking all medications 2 days ago due to return of significant myalgias and fatigue which have now improved off medications. Suspect this is likely from the Crestor as she has had trouble tolerating statins in the past. - Will stop Crestor but advised patient to restart Zetia.  - Will have patient follow-up with lipid clinic again. May need to consider PCSK9 inhibitor.  Mild Left Ankle Edema - Patient had very mild edema noted on left lateral ankle. No bony tenderness. No known injury. No erythema and not warm to the touch. No other lower extremity edema. Recommended continuing to monitor. Seeing PCP later this week and can follow-up with him at that time.   History of Shingles - Patient has a history of shingles on right upper extremity and continues to have numbness/pain. She is also concerned that she may have a new outbreak. One small erythematous papule noted on lateral aspect of upper right extremity but otherwise no rashes. She has appointment with PCP later this week.  Disposition: Follow-up with MD in 3 months.  Medication Adjustments/Labs and Tests Ordered: Current medicines are reviewed at length with the patient today.  Concerns regarding medicines are outlined above.  Orders Placed This Encounter  Procedures   Basic metabolic panel   EKG 12-Lead   No orders of the defined types were placed in this encounter.   Patient Instructions  Medication Instructions:  Your physician has recommended you make the following change in your medication:  1. STOP the Crestor 2.  RESTART the Telmisartan, Plavix, Hydrochlorothiazide, & Zetia  If you need a refill on your cardiac medications before your next appointment, please call your pharmacy.   Lab work: TODAY:  BMET    If you have labs (blood work) drawn today and your tests are completely normal, you will receive your results only by:  [employer] (if you have MyChart) OR  A paper copy in the mail If you have any lab test that is abnormal or we need to change your treatment, we will call you to review the results.  Testing/Procedures:   Follow-Up:  Your physician recommends that you schedule a follow-up appointment in: 1st Available in the Lipid Clinic.  At [facility], you and your health needs are our priority.  As part of our continuing mission to provide you with exceptional heart care, we have created designated Provider Care Teams.  These Care Teams include your primary Cardiologist (physician) and Advanced Practice  Providers (APPs -  Physician Assistants and Nurse Practitioners) who all work together to provide you with the care you need, when you need it. You will need a follow up appointment in 3 months with Dr. Eldridge Dace or a member of his care team.  Any Other Special Instructions Will Be Listed Below (If Applicable).       Signed, Corrin Parker, PA-C  02/19/2019 1:07 PM    [facility]

## 2019-02-19 ENCOUNTER — Encounter: Payer: Self-pay | Admitting: Student

## 2019-02-19 ENCOUNTER — Other Ambulatory Visit: Payer: Self-pay

## 2019-02-19 ENCOUNTER — Ambulatory Visit (INDEPENDENT_AMBULATORY_CARE_PROVIDER_SITE_OTHER): Payer: PPO | Admitting: Student

## 2019-02-19 VITALS — BP 148/78 | HR 68 | Ht 65.0 in | Wt 177.0 lb

## 2019-02-19 DIAGNOSIS — M25472 Effusion, left ankle: Secondary | ICD-10-CM | POA: Diagnosis not present

## 2019-02-19 DIAGNOSIS — I2510 Atherosclerotic heart disease of native coronary artery without angina pectoris: Secondary | ICD-10-CM | POA: Diagnosis not present

## 2019-02-19 DIAGNOSIS — Z8619 Personal history of other infectious and parasitic diseases: Secondary | ICD-10-CM

## 2019-02-19 DIAGNOSIS — R002 Palpitations: Secondary | ICD-10-CM

## 2019-02-19 DIAGNOSIS — I10 Essential (primary) hypertension: Secondary | ICD-10-CM

## 2019-02-19 DIAGNOSIS — E785 Hyperlipidemia, unspecified: Secondary | ICD-10-CM | POA: Diagnosis not present

## 2019-02-19 DIAGNOSIS — I350 Nonrheumatic aortic (valve) stenosis: Secondary | ICD-10-CM

## 2019-02-19 LAB — BASIC METABOLIC PANEL
BUN/Creatinine Ratio: 20 (ref 12–28)
BUN: 16 mg/dL (ref 8–27)
CO2: 22 mmol/L (ref 20–29)
Calcium: 9.2 mg/dL (ref 8.7–10.3)
Chloride: 109 mmol/L — ABNORMAL HIGH (ref 96–106)
Creatinine, Ser: 0.8 mg/dL (ref 0.57–1.00)
GFR calc Af Amer: 80 mL/min/{1.73_m2} (ref >59)
GFR calc non Af Amer: 69 mL/min/{1.73_m2} (ref >59)
Glucose: 98 mg/dL (ref 65–99)
Potassium: 4.1 mmol/L (ref 3.5–5.2)
Sodium: 145 mmol/L — ABNORMAL HIGH (ref 134–144)

## 2019-02-19 NOTE — Patient Instructions (Addendum)
Medication Instructions:  Your physician has recommended you make the following change in your medication:  1. STOP the Crestor 2. RESTART the Telmisartan, Plavix, Hydrochlorothiazide, & Zetia  If you need a refill on your cardiac medications before your next appointment, please call your pharmacy.   Lab work: TODAY:  BMET    If you have labs (blood work) drawn today and your tests are completely normal, you will receive your results only by: [facility] MyChart Message (if you have MyChart) OR . A paper copy in the mail If you have any lab test that is abnormal or we need to change your treatment, we will call you to review the results.  Testing/Procedures:   Follow-Up:  Your physician recommends that you schedule a follow-up appointment in: 1st Available in the [facility].  At [facility], you and your health needs are our priority.  As part of our continuing mission to provide you with exceptional heart care, we have created designated Provider Care Teams.  These Care Teams include your primary Cardiologist (physician) and Advanced Practice Providers (APPs -  Physician Assistants and Nurse Practitioners) who all work together to provide you with the care you need, when you need it. You will need a follow up appointment in 3 months with Dr. Irish Lack or a member of his care team.  Any Other Special Instructions Will Be Listed Below (If Applicable).

## 2019-02-21 ENCOUNTER — Telehealth: Payer: Self-pay

## 2019-02-21 DIAGNOSIS — R5383 Other fatigue: Secondary | ICD-10-CM | POA: Diagnosis not present

## 2019-02-21 DIAGNOSIS — M069 Rheumatoid arthritis, unspecified: Secondary | ICD-10-CM | POA: Diagnosis not present

## 2019-02-21 DIAGNOSIS — I2510 Atherosclerotic heart disease of native coronary artery without angina pectoris: Secondary | ICD-10-CM | POA: Diagnosis not present

## 2019-02-21 DIAGNOSIS — I10 Essential (primary) hypertension: Secondary | ICD-10-CM | POA: Diagnosis not present

## 2019-02-21 DIAGNOSIS — E785 Hyperlipidemia, unspecified: Secondary | ICD-10-CM | POA: Diagnosis not present

## 2019-02-21 DIAGNOSIS — G4733 Obstructive sleep apnea (adult) (pediatric): Secondary | ICD-10-CM | POA: Diagnosis not present

## 2019-02-21 NOTE — Telephone Encounter (Signed)
-----   Message from [name] [name], [place] sent at 02/21/2019  8:03 AM EDT ----- Please notify patient of lab results: Sodium level and chloride level minimally elevated. Sodium 1 point above normal range and chloride 3 points above normal range. Do not think this is clinically significant. Kidney function and potassium levels normal. I believe patient has follow-up with PCP today. Continue current plan and follow-up as directed.  Thank you!

## 2019-02-21 NOTE — Telephone Encounter (Signed)
Notes recorded by Frederik Schmidt, RN on 02/21/2019 at 9:02 AM EDT  The patient has been notified of the result and verbalized understanding. All questions (if any) were answered.  Frederik Schmidt, RN 02/21/2019 9:02 AM

## 2019-03-07 DIAGNOSIS — H34811 Central retinal vein occlusion, right eye, with macular edema: Secondary | ICD-10-CM | POA: Diagnosis not present

## 2019-03-08 ENCOUNTER — Ambulatory Visit (INDEPENDENT_AMBULATORY_CARE_PROVIDER_SITE_OTHER): Payer: PPO | Admitting: Pharmacist

## 2019-03-08 ENCOUNTER — Other Ambulatory Visit: Payer: Self-pay

## 2019-03-08 DIAGNOSIS — T466X5A Adverse effect of antihyperlipidemic and antiarteriosclerotic drugs, initial encounter: Secondary | ICD-10-CM | POA: Diagnosis not present

## 2019-03-08 DIAGNOSIS — E785 Hyperlipidemia, unspecified: Secondary | ICD-10-CM

## 2019-03-08 DIAGNOSIS — G720 Drug-induced myopathy: Secondary | ICD-10-CM | POA: Insufficient documentation

## 2019-03-08 NOTE — Progress Notes (Signed)
Patient ID: [name]                 DOB: 1936-03-10                    MRN: [id number]     HPI: [name] [name] is a 83 y.o. female patient of Dr [name] [name] referred to lipid clinic by [name] Rives, PA. PMH is significant for multivessel CAD on cardiac cath in 2009 managed medically due to poor PCI and CABG targets, mild aortic stenosis, HTN, HLD, OSA on CPAP, PACs/PVCs, RA, Hep B and C, and IBS. She was seen by lipid clinic in March and rosuvastatin was increased to 10mg  daily. Pt did not tolerate this secondary to myalgias. Zetia was then prescribed, however at follow up visit 2 months later it was noted that pt never started Zetia. She was encouraged to start this and presents today for follow up.  Pt presents today in good spirits. She is tolerating her ezetimibe well. She has stopped her rosuvastatin secondary to myalgias which she also previously experienced on atorvastatin. She experienced itching with pravastatin. She is unsure which medications she was taking when her July lipid panel was drawn.   She brings in her medications today. There are a few discrepancies noted - her PCP is prescribing metoprolol succinate 25mg  daily which was not on our med list, and she is taking HCTZ 12.5mg  not 25mg  daily. She uses her NTG 1-2x per month with symptom relief.  Current Medications: ezetimibe 10mg  daily Intolerances: atorvastatin, rosuvastatin 10mg  daily - myalgias and fatigue, pravastatin - itching Risk Factors: CAD, HTN, age LDL goal: 70mg /dL  Diet: oatmeal, raisin brain, shredded wheat, eggs occassionally, [place] bacon, no pork, [place] and chicken-boiled/baked, cooks with olive oil, eats out twice a month, off starch as of March 2020  Exercise: Minimal  Family History: The patient's family history includes Arthritis/Rheumatoid in her mother; Asthma in her mother and sister; COPD in her sister; Colon cancer in her maternal aunt; Emphysema in her sister; Liver disease in her father;  Lung cancer in her maternal aunt; Pancreatic cancer in her brother; Stroke in her mother and sister.  Social History: Denies tobacco, alcohol, and illicit drug use. Secondhand smoke exposure for 40 years.  Labs: 12/14/18: TC 121, TG 62, HDL 36, LDL 73 (pt unsure what she was taking) 08/10/18:  TC 139, TG 88, HDL 37, LDL 84, LFT WNLs (rosuvastatin 5mg  daily) 05/22/18 TC 194, TG 79, HDL 47, LDL 131 (no therapy)  Past Medical History:  Diagnosis Date  . Arthritis   . CAD (coronary artery disease)    a. 02/2008 Cath: EF 60%, RCA 100 w/ L->R collats, LM 20d, RI 60-70, small, LAD 60/29m, 99d, D1 50, OM1 50. Poor distal targets for CABG/no good interventional target -> medical Rx; b. Myoview in [place] in 3/11 -nl;  c. 07/2011 MV: EF 67%, mild inf ischemia->Med Rx; 02/2015 MV: EF 50%, small, mod intensity apical and apical inf rev defect-->Med Rx.  . Chronic cough    hx; ACEI stopped, possible contribution from GERD  . Depression   . Dyspnea    a. 08/2013 Echo: EF 60-65%, mild AS;  b. PFTs (10/10): FVC 103%, ratio 74%. mild obstruction; c. CT chest (6/11) no evidence for interstitial lung dz. possible asthma/upper airway cough sydrome. aspirin & beta blockers- potential causes for bronchospasm;  . GERD (gastroesophageal reflux disease)   . HBV (hepatitis B virus) infection   . HCV (hepatitis C virus)   .  History of PAC's    hx  . History of PVC's    hx  . HLD (hyperlipidemia)    unable to tolerate most statins due to myalgias and elevated CK. thinks pravastatin casued a rash. Only tolerates crestor.  . Hypertensive heart disease   . IBS (irritable bowel syndrome)   . Obesity   . Osteoarthritis    knee; s/p TKR  . Osteoporosis   . Pancreatitis    hx  . PUD (peptic ulcer disease)   . Sleep apnea    diagnosed in 2011 , did not want CPAP -     Current Outpatient Medications on File Prior to Visit  Medication Sig Dispense Refill  . acetaminophen (TYLENOL) 325 MG tablet Per bottle as needed    .  albuterol (PROAIR HFA) 108 (90 Base) MCG/ACT inhaler Inhale 2 puffs into the lungs every 4 (four) hours as needed for wheezing or shortness of breath. 1 Inhaler 1  . albuterol (PROVENTIL) (2.5 MG/3ML) 0.083% nebulizer solution Take 3 mLs (2.5 mg total) by nebulization every 6 (six) hours as needed for wheezing or shortness of breath. 270 mL 0  . budesonide-formoterol (SYMBICORT) 80-4.5 MCG/ACT inhaler Inhale 2 puffs into the lungs 2 (two) times daily. 1 Inhaler 11  . clopidogrel (PLAVIX) 75 MG tablet Take 1 tablet (75 mg total) by mouth daily. 90 tablet 3  . ezetimibe (ZETIA) 10 MG tablet Take 1 tablet (10 mg total) by mouth daily. 30 tablet 11  . gabapentin (NEURONTIN) 100 MG capsule Take 100 mg by mouth 3 (three) times daily.    . hydrochlorothiazide (HYDRODIURIL) 25 MG tablet Take 1 tablet (25 mg total) by mouth daily. 90 tablet 1  . LINZESS 145 MCG CAPS capsule Take 145 mcg by mouth as needed.    . nitroGLYCERIN (NITROSTAT) 0.4 MG SL tablet Place 1 tablet (0.4 mg total) under the tongue every 5 (five) minutes as needed for chest pain. 100 tablet 1  . pantoprazole (PROTONIX) 40 MG tablet Take 40 mg by mouth daily.  12  . telmisartan (MICARDIS) 80 MG tablet Take 1 tablet (80 mg total) by mouth daily. 90 tablet 3  . Vitamin D, Ergocalciferol, (DRISDOL) 1.25 MG (50000 UT) CAPS capsule Use as directed     No current facility-administered medications on file prior to visit.     Allergies  Allergen Reactions  . Aspirin Nausea Only  . Clarithromycin Other (See Comments)    unknown  . Doxycycline Nausea Only  . Pravastatin Itching    REACTION: itching  . Levaquin [Levofloxacin In D5w] Rash    Rash, HA  . Penicillins Itching and Rash    Other reaction(s): Unknown Has patient had a PCN reaction causing immediate rash, facial/tongue/throat swelling, SOB or lightheadedness with hypotension: No Has patient had a PCN reaction causing severe rash involving mucus membranes or skin necrosis: No Has  patient had a PCN reaction that required hospitalization No Has patient had a PCN reaction occurring within the last 10 years: No If all of the above answers are "NO", then may proceed with Cephalosporin use.     Assessment/Plan:  1. Hyperlipidemia - LDL above goal < 70 due to hx of ASCVD. Pt is intolerant to 3 statins but is tolerating ezetimibe well. Discussed PCSK9i therapy vs changing to Nexlizet. Pt prefers to try oral option. Will submit prior authorization and patient assistance for Nexlizet 180-10mg  daily. Discussed low fat diet as well. Will recheck lipids in 2 months, same day as office  visit with Dr Eldridge Dace.  Jarian Longoria E. Jesscia Imm, PharmD, BCACP, CPP [facility] [address] [place], [place] [address] Phone: [phone]; Fax: [phone] 03/08/2019 9:13 AM

## 2019-03-08 NOTE — Patient Instructions (Addendum)
It was nice to see you today  Your LDL goal is < 70  Continue your ezetimibe for now, I will submit paperwork to see if your insurance will cover Nexlizet - this is a combination pill that still contains ezetimibe but will lower your LDL by an extra 20%  We will try to get you approved for copay assistance through the [facility]  I will give you a call when you can pick up the [place] at your pharmacy. You will stop taking your ezetimibe and just take the Nexlizet once a day at this time  Recheck fasting cholesterol in 2 months, same day as office visit with Dr Irish Lack on December 7th

## 2019-03-09 ENCOUNTER — Other Ambulatory Visit: Payer: Self-pay | Admitting: Physician Assistant

## 2019-03-11 ENCOUNTER — Telehealth: Payer: Self-pay | Admitting: Pharmacist

## 2019-03-11 DIAGNOSIS — E785 Hyperlipidemia, unspecified: Secondary | ICD-10-CM

## 2019-03-11 MED ORDER — NEXLIZET 180-10 MG PO TABS: 1.0000 | ORAL_TABLET | Freq: Every day | ORAL | 11 refills | Status: DC

## 2019-03-11 NOTE — Telephone Encounter (Signed)
Nexlizet prior auth approved, $2500 [employer] approved as well. Rx sent to pharmacy, confirmed $0 copay. Pt is aware and will stop taking her ezetimibe when she starts her Nexlizet. Rechecking lipids in December when pt sees Dr Irish Lack.

## 2019-04-05 DIAGNOSIS — M48061 Spinal stenosis, lumbar region without neurogenic claudication: Secondary | ICD-10-CM | POA: Diagnosis not present

## 2019-04-15 ENCOUNTER — Telehealth: Payer: Self-pay | Admitting: Pharmacist

## 2019-04-15 NOTE — Telephone Encounter (Signed)
Pt reports joint pain and some GI upset. Reports symptoms started last week. Pt was seen in lipid clinic 5 weeks ago and was on ezetimibe at the time, she was switched to [place] for further LDL lowering.  Pt states she didn't pick up Nexlizet until today. She has just been taking ezetimibe until today. She does not want to try PCSK9i therapy. She will try Nexlizet and will call on Friday for update on tolerability.

## 2019-04-17 DIAGNOSIS — H2512 Age-related nuclear cataract, left eye: Secondary | ICD-10-CM | POA: Diagnosis not present

## 2019-04-17 DIAGNOSIS — H2511 Age-related nuclear cataract, right eye: Secondary | ICD-10-CM | POA: Diagnosis not present

## 2019-04-17 DIAGNOSIS — H34811 Central retinal vein occlusion, right eye, with macular edema: Secondary | ICD-10-CM | POA: Diagnosis not present

## 2019-04-17 DIAGNOSIS — H35351 Cystoid macular degeneration, right eye: Secondary | ICD-10-CM | POA: Diagnosis not present

## 2019-04-17 DIAGNOSIS — H35041 Retinal micro-aneurysms, unspecified, right eye: Secondary | ICD-10-CM | POA: Diagnosis not present

## 2019-04-19 NOTE — Telephone Encounter (Signed)
Called pt for update on lipid med tolerability. She reports feeling ok and is agreeable to continuing Nexlizet. F/u lipid panel scheduled at Dec office visit with Dr Irish Lack.

## 2019-04-26 DIAGNOSIS — G4733 Obstructive sleep apnea (adult) (pediatric): Secondary | ICD-10-CM | POA: Diagnosis not present

## 2019-04-26 DIAGNOSIS — I2510 Atherosclerotic heart disease of native coronary artery without angina pectoris: Secondary | ICD-10-CM | POA: Diagnosis not present

## 2019-04-26 DIAGNOSIS — M069 Rheumatoid arthritis, unspecified: Secondary | ICD-10-CM | POA: Diagnosis not present

## 2019-04-26 DIAGNOSIS — I10 Essential (primary) hypertension: Secondary | ICD-10-CM | POA: Diagnosis not present

## 2019-04-26 DIAGNOSIS — R5383 Other fatigue: Secondary | ICD-10-CM | POA: Diagnosis not present

## 2019-05-10 NOTE — Progress Notes (Signed)
Cardiology Office Note   Date:  05/13/2019   ID:  [name] [name], DOB April 09, 1936, MRN [id number]  PCP:  Jarome Matin (Inactive)    No chief complaint on file.  CAD  Wt Readings from Last 3 Encounters:  05/13/19 174 lb 6.4 oz (79.1 kg)  02/19/19 177 lb (80.3 kg)  11/05/18 175 lb (79.4 kg)       History of Present Illness: [name] [name] is a 83 y.o. female  with history of multivessel CAD on cath in 2009 managed medically due to poor PCI and CABG targets. Also has hypertension, HLD, OSA not on CPAP, PACs, PVCs, mild aortic stenosis.  She was seen by Dr. Okey Dupre in the past.  Myalgias have been an issue on statins.  She has been on and off statins.  It was noted in 10/19: "She was having myalgias and fatigue was concerned it was secondary to statins. He agreed to 1 month off of statins if her symptoms improve refer for PCSK9 inhibitor therapy. LDL was at 80 and not at goal. If her symptoms do not improve with 1 month off he recommended increasing her rosuvastatin to 40 mg daily."  She did not start ezetemibe.    She has not been walking that much.  Since the last visit, she has done well except for starting bempedoic acid.  She felt that she could not walk well and had anxiety attacks with the new medicine.  She was trying a NTG to relieve sx.  She hasn't taken it in 4 days and sx resolved. She fels better.  Denies : Chest pain. Dizziness. Leg edema. Nitroglycerin use. Orthopnea. Palpitations. Paroxysmal nocturnal dyspnea. Shortness of breath. Syncope.   She has stress from her husband's dementia.  She feels like this stress is her biggest problem.  Difficult to get good history from patient.    Past Medical History:  Diagnosis Date  . Arthritis   . CAD (coronary artery disease)    a. 02/2008 Cath: EF 60%, RCA 100 w/ L->R collats, LM 20d, RI 60-70, small, LAD 60/81m, 99d, D1 50, OM1 50. Poor distal targets for CABG/no good interventional target -> medical Rx; b.  [place]oview in [place] in 3/11 -nl;  c. 07/2011 MV: EF 67%, mild inf ischemia->Med Rx; 02/2015 MV: EF 50%, small, mod intensity apical and apical inf rev defect-->Med Rx.  . Chronic cough    hx; ACEI stopped, possible contribution from GERD  . Depression   . Dyspnea    a. 08/2013 Echo: EF 60-65%, mild AS;  b. PFTs (10/10): FVC 103%, ratio 74%. mild obstruction; c. CT chest (6/11) no evidence for interstitial lung dz. possible asthma/upper airway cough sydrome. aspirin & beta blockers- potential causes for bronchospasm;  . GERD (gastroesophageal reflux disease)   . HBV (hepatitis B virus) infection   . HCV (hepatitis C virus)   . History of PAC's    hx  . History of PVC's    hx  . HLD (hyperlipidemia)    unable to tolerate most statins due to myalgias and elevated CK. thinks pravastatin casued a rash. Only tolerates crestor.  . Hypertensive heart disease   . IBS (irritable bowel syndrome)   . Obesity   . Osteoarthritis    knee; s/p TKR  . Osteoporosis   . Pancreatitis    hx  . PUD (peptic ulcer disease)   . Sleep apnea    diagnosed in 2011 , did not want CPAP -  Past Surgical History:  Procedure Laterality Date  . BREAST EXCISIONAL BIOPSY Right 1990  . calcification of right breast-lumps Right 1998  . CARDIAC CATHETERIZATION  2009  . CHOLECYSTECTOMY    . hyesterectomy, unspec. area  1998  . pancreatic duct stent    . TUBAL LIGATION  1995      Current Outpatient Medications  Medication Sig Dispense Refill  . acetaminophen (TYLENOL) 325 MG tablet Per bottle as needed    . albuterol (PROVENTIL) (2.5 MG/3ML) 0.083% nebulizer solution Take 3 mLs (2.5 mg total) by nebulization every 6 (six) hours as needed for wheezing or shortness of breath. 270 mL 0  . Bempedoic Acid-Ezetimibe (NEXLIZET) 180-10 MG TABS Take 1 tablet by mouth daily. 30 tablet 11  . clopidogrel (PLAVIX) 75 MG tablet TAKE 1 TABLET BY MOUTH EVERY DAY 90 tablet 2  . gabapentin (NEURONTIN) 100 MG capsule Take 100 mg by  mouth 3 (three) times daily.    . hydrochlorothiazide (MICROZIDE) 12.5 MG capsule Take 12.5 mg by mouth daily.    Marland Kitchen LINZESS 145 MCG CAPS capsule Take 145 mcg by mouth as needed.    . metoprolol succinate (TOPROL-XL) 25 MG 24 hr tablet Take 25 mg by mouth daily.    . nitroGLYCERIN (NITROSTAT) 0.4 MG SL tablet Place 1 tablet (0.4 mg total) under the tongue every 5 (five) minutes as needed for chest pain. 100 tablet 1  . pantoprazole (PROTONIX) 40 MG tablet Take 40 mg by mouth daily.  12  . telmisartan (MICARDIS) 80 MG tablet Take 1 tablet (80 mg total) by mouth daily. 90 tablet 3  . Vitamin D, Ergocalciferol, (DRISDOL) 1.25 MG (50000 UT) CAPS capsule Use as directed     No current facility-administered medications for this visit.     Allergies:   Aspirin, Clarithromycin, Doxycycline, Pravastatin, Levaquin [levofloxacin in d5w], and Penicillins    Social History:  The patient  reports that she has never smoked. She has never used smokeless tobacco. She reports that she does not drink alcohol or use drugs.   Family History:  The patient's family history includes Arthritis/Rheumatoid in her mother; Asthma in her mother and sister; COPD in her sister; Colon cancer in her maternal aunt; Emphysema in her sister; Liver disease in her father; Lung cancer in her maternal aunt; Pancreatic cancer in her brother; Stroke in her mother and sister.    ROS:  Please see the history of present illness.   Otherwise, review of systems are positive for anxiety.   All other systems are reviewed and negative.    PHYSICAL EXAM: VS:  BP (!) 144/70   Pulse 65   Ht 5\' 5"  (1.651 m)   Wt 174 lb 6.4 oz (79.1 kg)   SpO2 97%   BMI 29.02 kg/m  , BMI Body mass index is 29.02 kg/m. GEN: Well nourished, well developed, in no acute distress  HEENT: normal  Neck: no JVD, carotid bruits, or masses Cardiac: RRR; 2/6 systolic murmur, no rubs, or gallops,no edema  Respiratory:  clear to auscultation bilaterally, normal work  of breathing GI: soft, nontender, nondistended, + BS MS: no deformity or atrophy  Skin: warm and dry, no rash Neuro:  Strength and sensation are intact Psych: euthymic mood, full affect   EKG:   The ekg ordered today demonstrates NSR, inferior TWI    Recent Labs: 08/10/2018: ALT 6 02/19/2019: BUN 16; Creatinine, Ser 0.80; Potassium 4.1; Sodium 145   Lipid Panel    Component Value Date/Time  CHOL 139 08/10/2018 0942   TRIG 88 08/10/2018 0942   HDL 37 (L) 08/10/2018 0942   CHOLHDL 3.8 08/10/2018 0942   CHOLHDL 2.0 06/16/2015 0800   VLDL 12 06/16/2015 0800   LDLCALC 84 08/10/2018 0942     Other studies Reviewed: Additional studies/ records that were reviewed today with results demonstrating: prior diffuse CAD.   ASSESSMENT AND PLAN:  1. CAD: Angina controlled with medication.  SHe has some exertional sx but they are rare, and she is not using NTG for this.  SHe only uses the NTG for when anxiety increases.  We discussed healthy diet.  SHe lost her appetite for a while.  SHe lost some weight with this.  2. Hyperlipidemia: Myalgias with statins.  Unusual side effects with bempedoic acid and she has stopped the medicine.  3. HTN: The current medical regimen is effective;  continue present plan and medications. 4. Mild Aortic stenosis: Normal EF.  Mild AS in 2018.  5. She blames the medicines she takes for anxiety, nausea and fatigue.  She does not like taking 5-6 pills per day and feels this is a "whole lot of medicine."   Current medicines are reviewed at length with the patient today.  The patient concerns regarding her medicines were addressed.  The following changes have been made:  No change  Labs/ tests ordered today include:  No orders of the defined types were placed in this encounter.   Recommend 150 minutes/week of aerobic exercise Low fat, low carb, high fiber diet recommended  Disposition:   FU in 1 year   Signed, Larae Grooms, MD  05/13/2019 9:20 AM     [facility] [place], [place], [place]  [address] Phone: [phone]; Fax: [phone]

## 2019-05-13 ENCOUNTER — Other Ambulatory Visit: Payer: Self-pay

## 2019-05-13 ENCOUNTER — Other Ambulatory Visit: Payer: PPO | Admitting: *Deleted

## 2019-05-13 ENCOUNTER — Encounter: Payer: Self-pay | Admitting: Interventional Cardiology

## 2019-05-13 ENCOUNTER — Ambulatory Visit (INDEPENDENT_AMBULATORY_CARE_PROVIDER_SITE_OTHER): Payer: PPO | Admitting: Interventional Cardiology

## 2019-05-13 VITALS — BP 144/70 | HR 65 | Ht 65.0 in | Wt 174.4 lb

## 2019-05-13 DIAGNOSIS — E785 Hyperlipidemia, unspecified: Secondary | ICD-10-CM

## 2019-05-13 DIAGNOSIS — I2510 Atherosclerotic heart disease of native coronary artery without angina pectoris: Secondary | ICD-10-CM

## 2019-05-13 DIAGNOSIS — I10 Essential (primary) hypertension: Secondary | ICD-10-CM

## 2019-05-13 DIAGNOSIS — I350 Nonrheumatic aortic (valve) stenosis: Secondary | ICD-10-CM

## 2019-05-13 LAB — COMPREHENSIVE METABOLIC PANEL
ALT: 8 IU/L (ref 0–32)
AST: 11 IU/L (ref 0–40)
Albumin/Globulin Ratio: 1.6 (ref 1.2–2.2)
Albumin: 4 g/dL (ref 3.6–4.6)
Alkaline Phosphatase: 106 IU/L (ref 39–117)
BUN/Creatinine Ratio: 19 (ref 12–28)
BUN: 16 mg/dL (ref 8–27)
Bilirubin Total: 0.2 mg/dL (ref 0.0–1.2)
CO2: 20 mmol/L (ref 20–29)
Calcium: 9 mg/dL (ref 8.7–10.3)
Chloride: 110 mmol/L — ABNORMAL HIGH (ref 96–106)
Creatinine, Ser: 0.86 mg/dL (ref 0.57–1.00)
GFR calc Af Amer: 73 mL/min/{1.73_m2} (ref >59)
GFR calc non Af Amer: 64 mL/min/{1.73_m2} (ref >59)
Globulin, Total: 2.5 g/dL (ref 1.5–4.5)
Glucose: 96 mg/dL (ref 65–99)
Potassium: 4 mmol/L (ref 3.5–5.2)
Sodium: 144 mmol/L (ref 134–144)
Total Protein: 6.5 g/dL (ref 6.0–8.5)

## 2019-05-13 LAB — LIPID PANEL
Chol/HDL Ratio: 3.3 ratio (ref 0.0–4.4)
Cholesterol, Total: 160 mg/dL (ref 100–199)
HDL: 48 mg/dL (ref >39)
LDL Chol Calc (NIH): 100 mg/dL — ABNORMAL HIGH (ref 0–99)
Triglycerides: 62 mg/dL (ref 0–149)
VLDL Cholesterol Cal: 12 mg/dL (ref 5–40)

## 2019-05-13 NOTE — Patient Instructions (Signed)

## 2019-05-20 ENCOUNTER — Telehealth: Payer: Self-pay | Admitting: Interventional Cardiology

## 2019-05-20 NOTE — Telephone Encounter (Signed)
[name] Booze, MD  05/14/2019 9:50 AM EST    CMet and lipids well controlled.   Pt aware of lab results, as indicated above by Dr. Irish Lack.  Pt states she tried to review them in [employer], but didn't quite understand what she was reading, for she states she new at using this feature. Informed the pt that Dr. Irish Lack said her CMET and lipids were well controlled. Pt education provided on what a CMET assess.  Pt verbalized understanding and agrees with this plan.  Pt was more than gracious for all the assistance provided.

## 2019-05-20 NOTE — Telephone Encounter (Signed)
Patient calling with questions in reference to her labs.

## 2019-05-22 DIAGNOSIS — H2512 Age-related nuclear cataract, left eye: Secondary | ICD-10-CM | POA: Diagnosis not present

## 2019-05-22 DIAGNOSIS — H35041 Retinal micro-aneurysms, unspecified, right eye: Secondary | ICD-10-CM | POA: Diagnosis not present

## 2019-05-22 DIAGNOSIS — H34811 Central retinal vein occlusion, right eye, with macular edema: Secondary | ICD-10-CM | POA: Diagnosis not present

## 2019-05-22 DIAGNOSIS — H2511 Age-related nuclear cataract, right eye: Secondary | ICD-10-CM | POA: Diagnosis not present

## 2019-06-19 DIAGNOSIS — M5117 Intervertebral disc disorders with radiculopathy, lumbosacral region: Secondary | ICD-10-CM | POA: Diagnosis not present

## 2019-06-19 DIAGNOSIS — B0229 Other postherpetic nervous system involvement: Secondary | ICD-10-CM | POA: Diagnosis not present

## 2019-06-19 DIAGNOSIS — B1920 Unspecified viral hepatitis C without hepatic coma: Secondary | ICD-10-CM | POA: Diagnosis not present

## 2019-06-20 ENCOUNTER — Telehealth: Payer: Self-pay | Admitting: Interventional Cardiology

## 2019-06-20 NOTE — Telephone Encounter (Signed)
Pt c/o medication issue:  1. Name of Medication: telmisartan (MICARDIS) 80 MG tablet  Bempedoic Acid-Ezetimibe (NEXLIZET) 180-10 MG TABS   2. How are you currently taking this medication (dosage and times per day)? 1 tablet by mouth daily   3. Are you having a reaction (difficulty breathing--STAT)? Yes  4. What is your medication issue? Patient is calling stating she has been experiencing severe back pain, headaches, nausea after eating, & cold like symptoms for the past 2 months. She states she looked up the side effects of the listed medications and they describe exactly how she feels. Please advise.

## 2019-06-20 NOTE — Telephone Encounter (Signed)
Attempted to call patient several times and phone keeps ringing once and hangs up. Will try again at another time.

## 2019-06-20 NOTE — Telephone Encounter (Signed)
Looks like [name] [name], [name] [name] restarted Telmisartan 02/2019.

## 2019-06-21 DIAGNOSIS — M25551 Pain in right hip: Secondary | ICD-10-CM | POA: Diagnosis not present

## 2019-06-21 DIAGNOSIS — M25572 Pain in left ankle and joints of left foot: Secondary | ICD-10-CM | POA: Diagnosis not present

## 2019-06-21 DIAGNOSIS — M25522 Pain in left elbow: Secondary | ICD-10-CM | POA: Diagnosis not present

## 2019-06-23 NOTE — Telephone Encounter (Signed)
This encounter was created in error - please disregard.

## 2019-06-24 DIAGNOSIS — M5117 Intervertebral disc disorders with radiculopathy, lumbosacral region: Secondary | ICD-10-CM | POA: Diagnosis not present

## 2019-06-24 DIAGNOSIS — G4733 Obstructive sleep apnea (adult) (pediatric): Secondary | ICD-10-CM | POA: Diagnosis not present

## 2019-06-24 DIAGNOSIS — I10 Essential (primary) hypertension: Secondary | ICD-10-CM | POA: Diagnosis not present

## 2019-06-24 DIAGNOSIS — I2510 Atherosclerotic heart disease of native coronary artery without angina pectoris: Secondary | ICD-10-CM | POA: Diagnosis not present

## 2019-06-24 DIAGNOSIS — Z1331 Encounter for screening for depression: Secondary | ICD-10-CM | POA: Diagnosis not present

## 2019-06-24 DIAGNOSIS — E785 Hyperlipidemia, unspecified: Secondary | ICD-10-CM | POA: Diagnosis not present

## 2019-06-26 DIAGNOSIS — H2511 Age-related nuclear cataract, right eye: Secondary | ICD-10-CM | POA: Diagnosis not present

## 2019-06-26 DIAGNOSIS — H2512 Age-related nuclear cataract, left eye: Secondary | ICD-10-CM | POA: Diagnosis not present

## 2019-06-26 DIAGNOSIS — H35041 Retinal micro-aneurysms, unspecified, right eye: Secondary | ICD-10-CM | POA: Diagnosis not present

## 2019-06-26 DIAGNOSIS — H34811 Central retinal vein occlusion, right eye, with macular edema: Secondary | ICD-10-CM | POA: Diagnosis not present

## 2019-06-28 ENCOUNTER — Ambulatory Visit: Payer: PPO | Attending: Internal Medicine

## 2019-06-28 DIAGNOSIS — Z23 Encounter for immunization: Secondary | ICD-10-CM

## 2019-06-28 NOTE — Progress Notes (Signed)
   Covid-19 Vaccination Clinic  Name:  SHAMAYA KAUER    MRN: [id number] DOB: [birth year]  06/28/2019  Ms. Novitsky was observed post Covid-19 immunization for 15 minutes without incidence. She was provided with Vaccine Information Sheet and instruction to access the V-Safe system.   Ms. Gindlesperger was instructed to call 911 with any severe reactions post vaccine: Marland Kitchen Difficulty breathing  . Swelling of your face and throat  . A fast heartbeat  . A bad rash all over your body  . Dizziness and weakness    Immunizations Administered    Name Date Dose VIS Date Route   Pfizer COVID-19 Vaccine 06/28/2019  8:50 AM 0.3 mL 05/17/2019 Intramuscular   Manufacturer: [employer], [employer]   Lot: [id number]   NDC: [id number]

## 2019-07-15 NOTE — Telephone Encounter (Signed)
Attempted to contact patient to follow up, but there was no answer. Left message to call back

## 2019-07-16 NOTE — Telephone Encounter (Signed)
Spoke to patient. She states that she was feeling poorly (back pain, nausea, Has) for 2 months so she stopped taking all of her medicines except for the telmisartan 80 mg QD. Bps have been 126/63 HR 69, 118/60 HR 65, 133/66 HR 72. She states that she feels a lot better and denies having any Sx or complaints. She states that her PCP Dr. Eloise Harman said that it was okay to stay off of her other medicines.   Of note, she was on metoprolol, HCTZ, nexlizet, and plavix. Patient does have HLD and MVCAD that was managed medically due to poor PCI and CABG targets. Will forward to Dr. Eldridge Dace for review and recommendation.

## 2019-07-17 MED ORDER — CLOPIDOGREL BISULFATE 75 MG PO TABS: 75.0000 mg | ORAL_TABLET | Freq: Every day | ORAL | 3 refills | Status: DC

## 2019-07-17 NOTE — Telephone Encounter (Signed)
Would add back clopidogrel 75 mg daily.  Can hold the others for now.  If she does ok, we can think about Nexlizet.  I think she can stay off of HCTZ and Toprol indefinitely while her BP is well controlled.   JV

## 2019-07-17 NOTE — Telephone Encounter (Signed)
Called and made patient aware of recommendations. She verbalized understanding.

## 2019-07-19 ENCOUNTER — Ambulatory Visit: Payer: PPO | Attending: Internal Medicine

## 2019-07-19 DIAGNOSIS — Z23 Encounter for immunization: Secondary | ICD-10-CM | POA: Insufficient documentation

## 2019-07-19 NOTE — Progress Notes (Signed)
   Covid-19 Vaccination Clinic  Name:  KAYSIE MICHELINI    MRN: [id number] DOB: [birth year]  07/19/2019  Ms. Diesing was observed post Covid-19 immunization for 15 minutes without incidence. She was provided with Vaccine Information Sheet and instruction to access the V-Safe system.   Ms. Boehning was instructed to call 911 with any severe reactions post vaccine: Marland Kitchen Difficulty breathing  . Swelling of your face and throat  . A fast heartbeat  . A bad rash all over your body  . Dizziness and weakness    Immunizations Administered    Name Date Dose VIS Date Route   Pfizer COVID-19 Vaccine 07/19/2019  8:57 AM 0.3 mL 05/17/2019 Intramuscular   Manufacturer: [employer], [employer]   Lot: [id number]   NDC: [address]-9

## 2019-07-26 ENCOUNTER — Telehealth: Payer: Self-pay | Admitting: Interventional Cardiology

## 2019-07-26 NOTE — Telephone Encounter (Signed)
New message  Pt c/o medication issue:  1. Name of Medication: clopidogrel (PLAVIX) 75 MG tablet  2. How are you currently taking this medication (dosage and times per day)? Once a day by mouth  3. Are you having a reaction (difficulty breathing--STAT)? No  4. What is your medication issue? Patient is calling in to speak with Dr. Eldridge Dace or his nurse. Patient states that she feels like she has been having anxiety since she started the clopidogrel (PLAVIX) 75 MG tablet. Please give patient a call back to discuss.

## 2019-07-26 NOTE — Telephone Encounter (Signed)
I agree.  Anxiety has been a chronic issue for her.

## 2019-07-26 NOTE — Telephone Encounter (Signed)
I spoke with patient. She reports anxiety recently and is wondering if it could be related to resuming Plavix.  She states she has had anxiety in the past.  I told her I did not think anxiety was a side effect of Plavix.  I asked her to follow up with her PCP regarding anxiety.

## 2019-07-29 NOTE — Telephone Encounter (Signed)
Called and made patient aware. She verbalized understanding and thanked me for the call. 

## 2019-09-10 DIAGNOSIS — J209 Acute bronchitis, unspecified: Secondary | ICD-10-CM | POA: Diagnosis not present

## 2019-09-10 DIAGNOSIS — J45998 Other asthma: Secondary | ICD-10-CM | POA: Diagnosis not present

## 2019-09-10 DIAGNOSIS — I10 Essential (primary) hypertension: Secondary | ICD-10-CM | POA: Diagnosis not present

## 2019-09-10 DIAGNOSIS — R0600 Dyspnea, unspecified: Secondary | ICD-10-CM | POA: Diagnosis not present

## 2019-09-10 DIAGNOSIS — M7551 Bursitis of right shoulder: Secondary | ICD-10-CM | POA: Diagnosis not present

## 2019-09-10 DIAGNOSIS — G4733 Obstructive sleep apnea (adult) (pediatric): Secondary | ICD-10-CM | POA: Diagnosis not present

## 2019-09-10 DIAGNOSIS — B0229 Other postherpetic nervous system involvement: Secondary | ICD-10-CM | POA: Diagnosis not present

## 2019-09-14 ENCOUNTER — Other Ambulatory Visit: Payer: Self-pay | Admitting: Interventional Cardiology

## 2019-09-18 DIAGNOSIS — M542 Cervicalgia: Secondary | ICD-10-CM | POA: Diagnosis not present

## 2019-09-18 DIAGNOSIS — M1711 Unilateral primary osteoarthritis, right knee: Secondary | ICD-10-CM | POA: Diagnosis not present

## 2019-10-10 ENCOUNTER — Other Ambulatory Visit: Payer: Self-pay | Admitting: Internal Medicine

## 2019-10-10 DIAGNOSIS — I10 Essential (primary) hypertension: Secondary | ICD-10-CM | POA: Diagnosis not present

## 2019-10-10 DIAGNOSIS — M5117 Intervertebral disc disorders with radiculopathy, lumbosacral region: Secondary | ICD-10-CM | POA: Diagnosis not present

## 2019-10-10 DIAGNOSIS — J45998 Other asthma: Secondary | ICD-10-CM | POA: Diagnosis not present

## 2019-10-10 DIAGNOSIS — G4733 Obstructive sleep apnea (adult) (pediatric): Secondary | ICD-10-CM | POA: Diagnosis not present

## 2019-10-10 DIAGNOSIS — Z1231 Encounter for screening mammogram for malignant neoplasm of breast: Secondary | ICD-10-CM

## 2019-10-10 DIAGNOSIS — I2510 Atherosclerotic heart disease of native coronary artery without angina pectoris: Secondary | ICD-10-CM | POA: Diagnosis not present

## 2019-10-27 ENCOUNTER — Other Ambulatory Visit: Payer: Self-pay | Admitting: Interventional Cardiology

## 2019-10-28 ENCOUNTER — Ambulatory Visit: Payer: PPO | Admitting: Interventional Cardiology

## 2019-10-28 ENCOUNTER — Encounter: Payer: Self-pay | Admitting: Interventional Cardiology

## 2019-10-28 ENCOUNTER — Other Ambulatory Visit: Payer: Self-pay

## 2019-10-28 VITALS — BP 130/82 | HR 88 | Ht 65.0 in | Wt 175.1 lb

## 2019-10-28 DIAGNOSIS — E785 Hyperlipidemia, unspecified: Secondary | ICD-10-CM | POA: Diagnosis not present

## 2019-10-28 DIAGNOSIS — I2510 Atherosclerotic heart disease of native coronary artery without angina pectoris: Secondary | ICD-10-CM

## 2019-10-28 DIAGNOSIS — I350 Nonrheumatic aortic (valve) stenosis: Secondary | ICD-10-CM | POA: Diagnosis not present

## 2019-10-28 DIAGNOSIS — I10 Essential (primary) hypertension: Secondary | ICD-10-CM

## 2019-10-28 DIAGNOSIS — I5033 Acute on chronic diastolic (congestive) heart failure: Secondary | ICD-10-CM | POA: Diagnosis not present

## 2019-10-28 MED ORDER — POTASSIUM CHLORIDE CRYS ER 20 MEQ PO TBCR: 20.0000 meq | EXTENDED_RELEASE_TABLET | Freq: Every day | ORAL | 3 refills | Status: DC | PRN

## 2019-10-28 MED ORDER — FUROSEMIDE 40 MG PO TABS: 40.0000 mg | ORAL_TABLET | Freq: Every day | ORAL | 3 refills | Status: DC | PRN

## 2019-10-28 NOTE — Progress Notes (Addendum)
Cardiology Office Note   Date:  10/28/2019   ID:  [name] [name], DOB 03/22/1936, MRN [id number]  PCP:  Jarome Matin (Inactive)    No chief complaint on file.  CAD  Wt Readings from Last 3 Encounters:  10/28/19 175 lb 1.9 oz (79.4 kg)  05/13/19 174 lb 6.4 oz (79.1 kg)  02/19/19 177 lb (80.3 kg)       History of Present Illness: [name] [name] is a 84 y.o. female  with history of multivessel CAD on cath in 2009 managed medically due to poor PCI and CABG targets. Also has hypertension, HLD, OSA not on CPAP, PACs, PVCs, mild aortic stenosis.  She was seen by Dr. Okey Dupre in the past. Myalgias have been an issue on statins. She has been on and off statins. It was noted in 10/19: "She was having myalgias and fatigue was concerned it was secondary to statins. He agreed to 1 month off of statins if her symptoms improve refer for PCSK9 inhibitor therapy. LDL was at 80 and not at goal. If her symptoms do not improve with 1 month off he recommended increasing her rosuvastatin to 40 mg daily."  She did not start ezetemibe.  She tried bempedoic acid in 2020.  She felt that she could not walk well and had anxiety attacks with the new medicine.  She was trying a NTG to relieve sx.  She hasn't taken it in 4 days and sx resolved. She fels better.  In the past, it was noted: "She blames the medicines she takes for anxiety, nausea and fatigue.  She does not like taking 5-6 pills per day and feels this is a "whole lot of medicine."  She reported LE swelling and DOE for about a month.  She feels she urinates a lot during the day.  Her husband has dementia so she cannot sleep at night due to caring for him.    Denies :  Dizziness.   Orthopnea. Palpitations. Paroxysmal nocturnal dyspnea.  Syncope.   Rare NTG use.  She has had cough with SHOB.  She got both COVID shots.   Past Medical History:  Diagnosis Date  . Arthritis   . CAD (coronary artery disease)    a. 02/2008 Cath: EF 60%,  RCA 100 w/ L->R collats, LM 20d, RI 60-70, small, LAD 60/29m, 99d, D1 50, OM1 50. Poor distal targets for CABG/no good interventional target -> medical Rx; b. Myoview in [place] in 3/11 -nl;  c. 07/2011 MV: EF 67%, mild inf ischemia->Med Rx; 02/2015 MV: EF 50%, small, mod intensity apical and apical inf rev defect-->Med Rx.  . Chronic cough    hx; ACEI stopped, possible contribution from GERD  . Depression   . Dyspnea    a. 08/2013 Echo: EF 60-65%, mild AS;  b. PFTs (10/10): FVC 103%, ratio 74%. mild obstruction; c. CT chest (6/11) no evidence for interstitial lung dz. possible asthma/upper airway cough sydrome. aspirin & beta blockers- potential causes for bronchospasm;  . GERD (gastroesophageal reflux disease)   . HBV (hepatitis B virus) infection   . HCV (hepatitis C virus)   . History of PAC's    hx  . History of PVC's    hx  . HLD (hyperlipidemia)    unable to tolerate most statins due to myalgias and elevated CK. thinks pravastatin casued a rash. Only tolerates crestor.  . Hypertensive heart disease   . IBS (irritable bowel syndrome)   . Obesity   . Osteoarthritis  knee; s/p TKR  . Osteoporosis   . Pancreatitis    hx  . PUD (peptic ulcer disease)   . Sleep apnea    diagnosed in 2011 , did not want CPAP -     Past Surgical History:  Procedure Laterality Date  . BREAST EXCISIONAL BIOPSY Right 1990  . calcification of right breast-lumps Right 1998  . CARDIAC CATHETERIZATION  2009  . CHOLECYSTECTOMY    . hyesterectomy, unspec. area  1998  . pancreatic duct stent    . TUBAL LIGATION  1995      Current Outpatient Medications  Medication Sig Dispense Refill  . acetaminophen (TYLENOL) 325 MG tablet Per bottle as needed    . albuterol (PROVENTIL) (2.5 MG/3ML) 0.083% nebulizer solution Take 3 mLs (2.5 mg total) by nebulization every 6 (six) hours as needed for wheezing or shortness of breath. 270 mL 0  . clopidogrel (PLAVIX) 75 MG tablet Take 1 tablet (75 mg total) by mouth daily.  90 tablet 3  . LINZESS 145 MCG CAPS capsule Take 145 mcg by mouth as needed.    . nitroGLYCERIN (NITROSTAT) 0.4 MG SL tablet USE AS DIRECTED 25 tablet 0  . telmisartan (MICARDIS) 80 MG tablet Take 1 tablet (80 mg total) by mouth daily. 90 tablet 3   No current facility-administered medications for this visit.    Allergies:   Aspirin, Clarithromycin, Doxycycline, Pravastatin, Levaquin [levofloxacin in d5w], and Penicillins    Social History:  The patient  reports that she has never smoked. She has never used smokeless tobacco. She reports that she does not drink alcohol or use drugs.   Family History:  The patient's *family history includes Arthritis/Rheumatoid in her mother; Asthma in her mother and sister; COPD in her sister; Colon cancer in her maternal aunt; Emphysema in her sister; Liver disease in her father; Lung cancer in her maternal aunt; Pancreatic cancer in her brother; Stroke in her mother and sister.    ROS:  Please see the history of present illness.   Otherwise, review of systems are positive for ankle edema.   All other systems are reviewed and negative.    PHYSICAL EXAM: VS:  BP 130/82   Pulse 88   Ht 5\' 5"  (1.651 m)   Wt 175 lb 1.9 oz (79.4 kg)   SpO2 95%   BMI 29.14 kg/m  , BMI Body mass index is 29.14 kg/m. GEN: Well nourished, well developed, in no acute distress  HEENT: normal  Neck: no JVD, carotid bruits, or masses Cardiac: RRR; no murmurs, rubs, or gallops,no edema  Respiratory:  clear to auscultation bilaterally, normal work of breathing GI: soft, nontender, nondistended, + BS MS: no deformity or atrophy ; 2+ DP pulses bilaterally Skin: warm and dry, no rash Neuro:  Strength and sensation are intact Psych: euthymic mood, full affect   Recent Labs: 05/13/2019: ALT 8; BUN 16; Creatinine, Ser 0.86; Potassium 4.0; Sodium 144   Lipid Panel    Component Value Date/Time   CHOL 160 05/13/2019 0830   TRIG 62 05/13/2019 0830   HDL 48 05/13/2019 0830    CHOLHDL 3.3 05/13/2019 0830   CHOLHDL 2.0 06/16/2015 0800   VLDL 12 06/16/2015 0800   LDLCALC 100 (H) 05/13/2019 0830     Other studies Reviewed: Additional studies/ records that were reviewed today with results demonstrating: PMD labs reviewed.   ASSESSMENT AND PLAN:  1. CAD: Angina well controlled with meds. COntinue aggressive secondary prevention.  2. Hyperlipidemia:  She has not  tolerated lipid lowering therapy.  3. HTN: The current medical regimen is effective;  continue present plan and medications. 4. Mild AS: no sx of severe AS. 5. Acute on chronic diastolic heart failure: some mild volume overload and SHOB.  Check BMet and BNP.  Can use prn Lasix and potassium. Elevate legs.  Compression stockings ok as well.    Current medicines are reviewed at length with the patient today.  The patient concerns regarding her medicines were addressed.  The following changes have been made:  No change  Labs/ tests ordered today include:  No orders of the defined types were placed in this encounter.   Recommend 150 minutes/week of aerobic exercise Low fat, low carb, high fiber diet recommended  Disposition:   FU in 6 months   Signed, Lance Muss, MD  10/28/2019 1:53 PM    [facility] [address] [place], [place], [place]  [address] Phone: [phone]; Fax: [phone]

## 2019-10-28 NOTE — Patient Instructions (Signed)
Medication Instructions:  Your physician has recommended you make the following change in your medication:   1. START: furosemide (lasix) 40 mg tablet: Take 1 tablet by mouth daily AS NEEDED for swelling  2. START: potassium chloride (k-dur) 20 mEq tablet: Take 1 tablet by mouth daily AS NEEDED with lasix only  *If you need a refill on your cardiac medications before your next appointment, please call your pharmacy*   Lab Work: TODAY: BMET, BNP  If you have labs (blood work) drawn today and your tests are completely normal, you will receive your results only by: Marland Kitchen MyChart Message (if you have MyChart) OR . A paper copy in the mail If you have any lab test that is abnormal or we need to change your treatment, we will call you to review the results.   Testing/Procedures: None ordered   Follow-Up: Follow up in December as planned.    Other Instructions Elevate legs and wear compression stockings to help with swelling

## 2019-10-29 LAB — BASIC METABOLIC PANEL
BUN/Creatinine Ratio: 20 (ref 12–28)
BUN: 16 mg/dL (ref 8–27)
CO2: 18 mmol/L — ABNORMAL LOW (ref 20–29)
Calcium: 9.4 mg/dL (ref 8.7–10.3)
Chloride: 111 mmol/L — ABNORMAL HIGH (ref 96–106)
Creatinine, Ser: 0.81 mg/dL (ref 0.57–1.00)
GFR calc Af Amer: 78 mL/min/{1.73_m2} (ref >59)
GFR calc non Af Amer: 68 mL/min/{1.73_m2} (ref >59)
Glucose: 112 mg/dL — ABNORMAL HIGH (ref 65–99)
Potassium: 3.9 mmol/L (ref 3.5–5.2)
Sodium: 147 mmol/L — ABNORMAL HIGH (ref 134–144)

## 2019-10-29 LAB — CBC
Hematocrit: 35.9 % (ref 34.0–46.6)
Hemoglobin: 11.8 g/dL (ref 11.1–15.9)
MCH: 29.7 pg (ref 26.6–33.0)
MCHC: 32.9 g/dL (ref 31.5–35.7)
MCV: 90 fL (ref 79–97)
Platelets: 176 10*3/uL (ref 150–450)
RBC: 3.97 x10E6/uL (ref 3.77–5.28)
RDW: 12.7 % (ref 11.7–15.4)
WBC: 6 10*3/uL (ref 3.4–10.8)

## 2019-10-30 DIAGNOSIS — M1711 Unilateral primary osteoarthritis, right knee: Secondary | ICD-10-CM | POA: Diagnosis not present

## 2019-10-30 LAB — PRO B NATRIURETIC PEPTIDE: NT-Pro BNP: 209 pg/mL (ref 0–738)

## 2019-10-30 LAB — SPECIMEN STATUS REPORT

## 2019-11-08 DIAGNOSIS — M1711 Unilateral primary osteoarthritis, right knee: Secondary | ICD-10-CM | POA: Diagnosis not present

## 2019-11-12 ENCOUNTER — Ambulatory Visit
Admission: RE | Admit: 2019-11-12 | Discharge: 2019-11-12 | Disposition: A | Discharge status: Home / Self Care | Payer: PPO | Source: Ambulatory Visit | Attending: Internal Medicine | Admitting: Internal Medicine

## 2019-11-12 ENCOUNTER — Other Ambulatory Visit: Payer: Self-pay

## 2019-11-12 DIAGNOSIS — Z1231 Encounter for screening mammogram for malignant neoplasm of breast: Secondary | ICD-10-CM | POA: Diagnosis not present

## 2019-11-15 DIAGNOSIS — M1711 Unilateral primary osteoarthritis, right knee: Secondary | ICD-10-CM | POA: Diagnosis not present

## 2019-11-22 DIAGNOSIS — I2510 Atherosclerotic heart disease of native coronary artery without angina pectoris: Secondary | ICD-10-CM | POA: Diagnosis not present

## 2019-11-22 DIAGNOSIS — R252 Cramp and spasm: Secondary | ICD-10-CM | POA: Diagnosis not present

## 2019-11-22 DIAGNOSIS — I10 Essential (primary) hypertension: Secondary | ICD-10-CM | POA: Diagnosis not present

## 2019-11-22 DIAGNOSIS — B0229 Other postherpetic nervous system involvement: Secondary | ICD-10-CM | POA: Diagnosis not present

## 2019-11-22 DIAGNOSIS — G4733 Obstructive sleep apnea (adult) (pediatric): Secondary | ICD-10-CM | POA: Diagnosis not present

## 2019-11-22 DIAGNOSIS — J449 Chronic obstructive pulmonary disease, unspecified: Secondary | ICD-10-CM | POA: Diagnosis not present

## 2019-11-22 DIAGNOSIS — M1711 Unilateral primary osteoarthritis, right knee: Secondary | ICD-10-CM | POA: Diagnosis not present

## 2019-11-22 DIAGNOSIS — G4700 Insomnia, unspecified: Secondary | ICD-10-CM | POA: Diagnosis not present

## 2019-12-25 DIAGNOSIS — M1711 Unilateral primary osteoarthritis, right knee: Secondary | ICD-10-CM | POA: Diagnosis not present

## 2019-12-27 DIAGNOSIS — E7849 Other hyperlipidemia: Secondary | ICD-10-CM | POA: Diagnosis not present

## 2019-12-27 DIAGNOSIS — M859 Disorder of bone density and structure, unspecified: Secondary | ICD-10-CM | POA: Diagnosis not present

## 2020-01-01 ENCOUNTER — Other Ambulatory Visit: Payer: Self-pay | Admitting: Interventional Cardiology

## 2020-01-03 DIAGNOSIS — Z0000 Encounter for general adult medical examination without abnormal findings: Secondary | ICD-10-CM | POA: Diagnosis not present

## 2020-01-03 DIAGNOSIS — I10 Essential (primary) hypertension: Secondary | ICD-10-CM | POA: Diagnosis not present

## 2020-01-03 DIAGNOSIS — E785 Hyperlipidemia, unspecified: Secondary | ICD-10-CM | POA: Diagnosis not present

## 2020-01-03 DIAGNOSIS — E669 Obesity, unspecified: Secondary | ICD-10-CM | POA: Diagnosis not present

## 2020-01-03 DIAGNOSIS — E559 Vitamin D deficiency, unspecified: Secondary | ICD-10-CM | POA: Diagnosis not present

## 2020-01-03 DIAGNOSIS — M5117 Intervertebral disc disorders with radiculopathy, lumbosacral region: Secondary | ICD-10-CM | POA: Diagnosis not present

## 2020-01-03 DIAGNOSIS — I872 Venous insufficiency (chronic) (peripheral): Secondary | ICD-10-CM | POA: Diagnosis not present

## 2020-01-03 DIAGNOSIS — R82998 Other abnormal findings in urine: Secondary | ICD-10-CM | POA: Diagnosis not present

## 2020-01-03 DIAGNOSIS — I2510 Atherosclerotic heart disease of native coronary artery without angina pectoris: Secondary | ICD-10-CM | POA: Diagnosis not present

## 2020-01-03 DIAGNOSIS — B1920 Unspecified viral hepatitis C without hepatic coma: Secondary | ICD-10-CM | POA: Diagnosis not present

## 2020-01-03 DIAGNOSIS — Z658 Other specified problems related to psychosocial circumstances: Secondary | ICD-10-CM | POA: Diagnosis not present

## 2020-01-03 DIAGNOSIS — J449 Chronic obstructive pulmonary disease, unspecified: Secondary | ICD-10-CM | POA: Diagnosis not present

## 2020-01-03 DIAGNOSIS — Z1339 Encounter for screening examination for other mental health and behavioral disorders: Secondary | ICD-10-CM | POA: Diagnosis not present

## 2020-01-03 DIAGNOSIS — G4733 Obstructive sleep apnea (adult) (pediatric): Secondary | ICD-10-CM | POA: Diagnosis not present

## 2020-01-20 ENCOUNTER — Other Ambulatory Visit: Payer: Self-pay | Admitting: Interventional Cardiology

## 2020-01-20 ENCOUNTER — Other Ambulatory Visit: Payer: Self-pay | Admitting: Internal Medicine

## 2020-01-20 NOTE — Telephone Encounter (Signed)
Please review for refill. Thanks!  

## 2020-02-06 ENCOUNTER — Telehealth: Payer: Self-pay | Admitting: Interventional Cardiology

## 2020-02-06 MED ORDER — FUROSEMIDE 40 MG PO TABS: 40.0000 mg | ORAL_TABLET | Freq: Every day | ORAL | 3 refills | Status: DC | PRN

## 2020-02-06 NOTE — Telephone Encounter (Signed)
Pt c/o swelling: STAT is pt has developed SOB within 24 hours  1) How much weight have you gained and in what time span?   2) If swelling, where is the swelling located? feet,ankles, legs and toes  3) Are you currently taking a fluid pill? no  4) Are you currently SOB? no  5) Do you have a log of your daily weights (if so, list)? No  6) Have you gained 3 pounds in a day or 5 pounds in a week? no  7) Have you traveled recently? no

## 2020-02-06 NOTE — Telephone Encounter (Signed)
Called and spoke to patient. She states that she has had swelling in her feet, ankles, and legs for the past month. She states that she is not SOB and has not gained any weight that she knows of. She has not been using her prn furosemide. Instructed patient to use her prn lasix and potassium, elevate legs, wear compression stockings, and avoid salt in her diet. Instructed the patient to let me know if her Sx did not improve or if they change or worsen. She verbalized understanding and thanked me for the call.

## 2020-02-21 DIAGNOSIS — M1711 Unilateral primary osteoarthritis, right knee: Secondary | ICD-10-CM | POA: Diagnosis not present

## 2020-02-25 NOTE — Progress Notes (Signed)
Cardiology Office Note:    Date:  02/26/2020   ID:  Genice Rouge, DOB 08-30-1935, MRN [id number]  PCP:  Jarome Matin (Inactive)  [facility] Cardiologist:  Lance Muss, MD   [facility] Electrophysiologist:  None   Referring MD: No ref. provider found   Chief Complaint:  Leg Swelling    Patient Profile:    [name] [name] is a 84 y.o. female with:   Coronary artery disease   Cath 2009: pRCA w CTO; dLAD sub-total occlusion; o/w mod non-obs dz  No targets for PCI or CABG >> Med Rx  Myoview in 2013: inf ischemia  Myoview in 2016: inf ischemia, EF 50  Aortic stenosis, mild  Echocardiogram 3/18: mean 10 mmHg, EF 60-65, Gr 1 DD  Diastolic CHF  Hypertension   PUD  Hx of HBV, HCV  GERD  PVCs, PACs  Chronic cough, ACEi stopped, ?related to GERD  Possible asthma or upper airways cough syndrome   Hyperlipidemia   Intol of statins - Rosuvastatin (CK elevated); Pravastatin (rash); Atorvastatin (myalgias)  Hx of pancreatitis  Obesity   Rheumatoid arthritis   Prior CV studies: Echocardiogram 08/05/16 Mild focal basal septal hypertrophy, EF 60-65, no RWMA, Gr 1 DD, mild AS (mean 10 mmHg)  Myoview 02/19/15 EF 50, apical and apical inf reversible defect (poss ischemia); intermediate risk  Myoview 07/26/11 EF 67, mild inf defect suggestive of ischemia  Cardiac catheterization 02/20/08 EF 60 RCA prox 100 w L-R collats LM dist 20 RI mid 60-70 (small) LAD mid 60, 70, dist sub total occlusion; D1 mid 50 LCx irregs; OM1 mid 50 No targets for PCI - Med Rx  History of Present Illness:    [name] [name] was last seen by Dr. Eldridge Dace in 5/21.  She returns for the evaluation of leg swelling.  She is here alone.  Over the past month, her feet have been swollen.  The right is worse than the left.  She has significant issues with rheumatoid arthritis.  She has had several injections in her hips.  She is also had gel placed in her right knee.  Her  husband has dementia.  She is up several hours a night with him.  When she is able to prop her feet up, she has minimal improvement with elevation.  She does note shortness of breath with activity.  She sometimes takes sublingual nitroglycerin with improvement in her breathing.  She has not had chest pain, arm pain, jaw pain or back pain.  She has not had syncope or orthopnea.  She does wake up in the middle the night out of breath at times.      Past Medical History:  Diagnosis Date  . Arthritis   . CAD (coronary artery disease)    a. 02/2008 Cath: EF 60%, RCA 100 w/ L->R collats, LM 20d, RI 60-70, small, LAD 60/49m, 99d, D1 50, OM1 50. Poor distal targets for CABG/no good interventional target -> medical Rx; b. Myoview in [place] in 3/11 -nl;  c. 07/2011 MV: EF 67%, mild inf ischemia->Med Rx; 02/2015 MV: EF 50%, small, mod intensity apical and apical inf rev defect-->Med Rx.  . Chronic cough    hx; ACEI stopped, possible contribution from GERD  . Depression   . Dyspnea    a. 08/2013 Echo: EF 60-65%, mild AS;  b. PFTs (10/10): FVC 103%, ratio 74%. mild obstruction; c. CT chest (6/11) no evidence for interstitial lung dz. possible asthma/upper airway cough sydrome. aspirin & beta blockers- potential causes  for bronchospasm;  . GERD (gastroesophageal reflux disease)   . HBV (hepatitis B virus) infection   . HCV (hepatitis C virus)   . History of PAC's    hx  . History of PVC's    hx  . HLD (hyperlipidemia)    unable to tolerate most statins due to myalgias and elevated CK. thinks pravastatin casued a rash. Only tolerates crestor.  . Hypertensive heart disease   . IBS (irritable bowel syndrome)   . Obesity   . Osteoarthritis    knee; s/p TKR  . Osteoporosis   . Pancreatitis    hx  . PUD (peptic ulcer disease)   . Sleep apnea    diagnosed in 2011 , did not want CPAP -     Current Medications: Current Meds  Medication Sig  . acetaminophen (TYLENOL) 325 MG tablet Per bottle as needed  .  albuterol (PROVENTIL) (2.5 MG/3ML) 0.083% nebulizer solution Take 3 mLs (2.5 mg total) by nebulization every 6 (six) hours as needed for wheezing or shortness of breath.  . clopidogrel (PLAVIX) 75 MG tablet Take 1 tablet (75 mg total) by mouth daily.  Marland Kitchen ezetimibe (ZETIA) 10 MG tablet Take 10 mg by mouth daily.  . furosemide (LASIX) 40 MG tablet Take 0.5 tablet by mouth for 3 days, then take as needed for swelling  . LINZESS 145 MCG CAPS capsule Take 145 mcg by mouth as needed.  . nitroGLYCERIN (NITROSTAT) 0.4 MG SL tablet Place 1 tablet (0.4 mg total) under the tongue every 5 (five) minutes as needed.  . pantoprazole (PROTONIX) 40 MG tablet Take 40 mg by mouth daily.  . potassium chloride SA (KLOR-CON) 20 MEQ tablet Take 0.5 tablet by mouth once a day for 3 days, then take as needed with Furosemide for swelling  . telmisartan (MICARDIS) 80 MG tablet TAKE 1 TABLET BY MOUTH EVERY DAY  . [DISCONTINUED] furosemide (LASIX) 40 MG tablet Take 1 tablet (40 mg total) by mouth daily as needed.  . [DISCONTINUED] potassium chloride SA (KLOR-CON) 20 MEQ tablet Take 1 tablet (20 mEq total) by mouth daily as needed (take with furosemide (lasix) only).     Allergies:   Aspirin, Clarithromycin, Doxycycline, Pravastatin, Levaquin [levofloxacin in d5w], and Penicillins   Social History   Tobacco Use  . Smoking status: Never Smoker  . Smokeless tobacco: Never Used  . Tobacco comment: 2nd hand exposure x40 years   Vaping Use  . Vaping Use: Never used  Substance Use Topics  . Alcohol use: No  . Drug use: No     Family Hx: The patient's family history includes Arthritis/Rheumatoid in her mother; Asthma in her mother and sister; COPD in her sister; Colon cancer in her maternal aunt; Emphysema in her sister; Liver disease in her father; Lung cancer in her maternal aunt; Pancreatic cancer in her brother; Stroke in her mother and sister.  Review of Systems  Gastrointestinal: Negative for hematochezia and  melena.  Genitourinary: Positive for frequency. Negative for hematuria.     EKGs/Labs/Other Test Reviewed:    EKG:  EKG is   ordered today.  The ekg ordered today demonstrates normal sinus rhythm, heart rate 69, left axis deviation, nonspecific ST-T wave changes, QTC 422, no change from prior tracing  Recent Labs: 05/13/2019: ALT 8 10/28/2019: BUN 16; Creatinine, Ser 0.81; Hemoglobin 11.8; NT-Pro BNP 209; Platelets 176; Potassium 3.9; Sodium 147   Recent Lipid Panel Lab Results  Component Value Date/Time   CHOL 160 05/13/2019 08:30 AM  TRIG 62 05/13/2019 08:30 AM   HDL 48 05/13/2019 08:30 AM   CHOLHDL 3.3 05/13/2019 08:30 AM   CHOLHDL 2.0 06/16/2015 08:00 AM   LDLCALC 100 (H) 05/13/2019 08:30 AM    Physical Exam:    VS:  BP 140/90   Pulse 69   Ht 5\' 5"  (1.651 m)   Wt 176 lb (79.8 kg)   SpO2 96%   BMI 29.29 kg/m     Wt Readings from Last 3 Encounters:  02/26/20 176 lb (79.8 kg)  10/28/19 175 lb 1.9 oz (79.4 kg)  05/13/19 174 lb 6.4 oz (79.1 kg)     Constitutional:      Appearance: Healthy appearance. Not in distress.  Neck:     Vascular: JVD normal.  Pulmonary:     Effort: Pulmonary effort is normal.     Breath sounds: No wheezing. No rales.  Cardiovascular:     Normal rate. Regular rhythm. Normal S1. Normal S2.     Murmurs: There is a grade 2/6 crescendo-decrescendo systolic murmur at the URSB.  Edema:    Ankle: trace edema of the left ankle and 1+ edema of the right ankle.    Feet: bilateral trace edema of the feet. Abdominal:     Palpations: Abdomen is soft.  Skin:    General: Skin is warm and dry.  Neurological:     Mental Status: Alert and oriented to person, place and time.     Cranial Nerves: Cranial nerves are intact.      ASSESSMENT & PLAN:    1. Chronic diastolic CHF (congestive heart failure) (HCC) 2. Leg swelling 3. Shortness of breath 4. Coronary artery disease involving native coronary artery of native heart without angina pectoris I  suspect her swelling may be related to her arthritic issues.  However, she has been more short of breath and she does have a history of diastolic heart failure.  It has also been several years since her last echocardiogram and she does have a history of mild aortic stenosis.  She has a history of coronary artery disease with a chronically occluded RCA and subtotal occlusion of the distal LAD and moderate disease elsewhere.  In 2009 at her cardiac catheterization there were no targets for PCI or CABG and she has been managed medically.  Question if some of her shortness of breath may be anginal.  She does have improvement in her breathing when she takes sublingual nitroglycerin.  She was given furosemide by her PCP.  She took it once and she felt poorly the rest of the day.  However, she may have just been tired from being up with her husband all night.  -Obtain CMET, CBC, BNP  -Take furosemide 20 mg daily x3 days, then as needed  -Take potassium 10 mEq daily x3 days, then as needed with furosemide  -Start isosorbide 15 mg daily  -Obtain echocardiogram  -If EF normal and aortic stenosis is not significantly progressed, obtain Myoview  -Follow-up in 4 weeks  5. Mild aortic stenosis As noted, I will obtain a follow-up echocardiogram to reassess her aortic stenosis.  6. Essential hypertension Blood pressure above target.  Add isosorbide 15 mg daily.  Continue current dose of telmisartan.  7. Hyperlipidemia LDL goal <70 She has had intolerances to cholesterol-lowering therapy in the past.  Continue ezetimibe.    Dispo:  Return in about 4 weeks (around 03/25/2020) for Routine Follow Up with Dr. 03/27/2020, or Eldridge Dace, PA-C, in person.   Medication Adjustments/Labs and  Tests Ordered: Current medicines are reviewed at length with the patient today.  Concerns regarding medicines are outlined above.  Tests Ordered: Orders Placed This Encounter  Procedures  . CBC  . Pro b natriuretic peptide (BNP)   . Comprehensive metabolic panel  . EKG 12-Lead  . ECHOCARDIOGRAM COMPLETE   Medication Changes: Meds ordered this encounter  Medications  . furosemide (LASIX) 40 MG tablet    Sig: Take 0.5 tablet by mouth for 3 days, then take as needed for swelling    Dispense:  30 tablet    Refill:  3  . potassium chloride SA (KLOR-CON) 20 MEQ tablet    Sig: Take 0.5 tablet by mouth once a day for 3 days, then take as needed with Furosemide for swelling    Dispense:  30 tablet    Refill:  3  . isosorbide mononitrate (IMDUR) 30 MG 24 hr tablet    Sig: Take 0.5 tablets (15 mg total) by mouth daily.    Dispense:  45 tablet    Refill:  3    Signed, Tereso Newcomer, PA-C  02/26/2020 10:53 AM    [facility] [address] [place], [place], [place]  [address] Phone: [phone]; Fax: [phone]

## 2020-02-26 ENCOUNTER — Ambulatory Visit (INDEPENDENT_AMBULATORY_CARE_PROVIDER_SITE_OTHER): Payer: PPO | Admitting: Physician Assistant

## 2020-02-26 ENCOUNTER — Other Ambulatory Visit: Payer: Self-pay

## 2020-02-26 ENCOUNTER — Encounter: Payer: Self-pay | Admitting: Physician Assistant

## 2020-02-26 VITALS — BP 140/90 | HR 69 | Ht 65.0 in | Wt 176.0 lb

## 2020-02-26 DIAGNOSIS — I2510 Atherosclerotic heart disease of native coronary artery without angina pectoris: Secondary | ICD-10-CM | POA: Diagnosis not present

## 2020-02-26 DIAGNOSIS — I350 Nonrheumatic aortic (valve) stenosis: Secondary | ICD-10-CM

## 2020-02-26 DIAGNOSIS — R0602 Shortness of breath: Secondary | ICD-10-CM | POA: Diagnosis not present

## 2020-02-26 DIAGNOSIS — E785 Hyperlipidemia, unspecified: Secondary | ICD-10-CM | POA: Diagnosis not present

## 2020-02-26 DIAGNOSIS — I5032 Chronic diastolic (congestive) heart failure: Secondary | ICD-10-CM | POA: Diagnosis not present

## 2020-02-26 DIAGNOSIS — I10 Essential (primary) hypertension: Secondary | ICD-10-CM | POA: Diagnosis not present

## 2020-02-26 DIAGNOSIS — M7989 Other specified soft tissue disorders: Secondary | ICD-10-CM | POA: Diagnosis not present

## 2020-02-26 MED ORDER — FUROSEMIDE 40 MG PO TABS: ORAL_TABLET | ORAL | 3 refills | Status: DC

## 2020-02-26 MED ORDER — ISOSORBIDE MONONITRATE ER 30 MG PO TB24: 15.0000 mg | ORAL_TABLET | Freq: Every day | ORAL | 3 refills | Status: DC

## 2020-02-26 MED ORDER — POTASSIUM CHLORIDE CRYS ER 20 MEQ PO TBCR: EXTENDED_RELEASE_TABLET | ORAL | 3 refills | Status: DC

## 2020-02-26 NOTE — Patient Instructions (Signed)
Medication Instructions:  Your physician has recommended you make the following change in your medication:   1) Take 20 mg (0.5 tablet) of Furosemide once a day for 3 days, then take as needed for swelling 2) Take 10 mEq (0.5 tablet) of Potassium once a day for 3 days, then take as needed for swelling 3) Start Imdur 30 mg, take 0.5 tablet (15 mg) by mouth once a day   *If you need a refill on your cardiac medications before your next appointment, please call your pharmacy*  Lab Work: You will have labs drawn today: BNP/CMET/CBC  Testing/Procedures: Your physician has requested that you have an echocardiogram. Echocardiography is a painless test that uses sound waves to create images of your heart. It provides your doctor with information about the size and shape of your heart and how well your heart's chambers and valves are working. This procedure takes approximately one hour. There are no restrictions for this procedure.  Follow-Up: At [facility], you and your health needs are our priority.  As part of our continuing mission to provide you with exceptional heart care, we have created designated Provider Care Teams.  These Care Teams include your primary Cardiologist (physician) and Advanced Practice Providers (APPs -  Physician Assistants and Nurse Practitioners) who all work together to provide you with the care you need, when you need it.  Your next appointment:   4 week(s)  The format for your next appointment:   In Person  Provider:   You may see Lance Muss, MD or Tereso Newcomer, PA-C

## 2020-02-27 LAB — CBC
Hematocrit: 36.8 % (ref 34.0–46.6)
Hemoglobin: 12 g/dL (ref 11.1–15.9)
MCH: 29.3 pg (ref 26.6–33.0)
MCHC: 32.6 g/dL (ref 31.5–35.7)
MCV: 90 fL (ref 79–97)
Platelets: 171 10*3/uL (ref 150–450)
RBC: 4.09 x10E6/uL (ref 3.77–5.28)
RDW: 12.9 % (ref 11.7–15.4)
WBC: 8.5 10*3/uL (ref 3.4–10.8)

## 2020-02-27 LAB — PRO B NATRIURETIC PEPTIDE: NT-Pro BNP: 262 pg/mL (ref 0–738)

## 2020-02-27 LAB — COMPREHENSIVE METABOLIC PANEL
ALT: 6 IU/L (ref 0–32)
AST: 7 IU/L (ref 0–40)
Albumin/Globulin Ratio: 1.7 (ref 1.2–2.2)
Albumin: 4.1 g/dL (ref 3.6–4.6)
Alkaline Phosphatase: 94 IU/L (ref 44–121)
BUN/Creatinine Ratio: 19 (ref 12–28)
BUN: 18 mg/dL (ref 8–27)
Bilirubin Total: 0.4 mg/dL (ref 0.0–1.2)
CO2: 24 mmol/L (ref 20–29)
Calcium: 9 mg/dL (ref 8.7–10.3)
Chloride: 109 mmol/L — ABNORMAL HIGH (ref 96–106)
Creatinine, Ser: 0.95 mg/dL (ref 0.57–1.00)
GFR calc Af Amer: 65 mL/min/{1.73_m2} (ref >59)
GFR calc non Af Amer: 56 mL/min/{1.73_m2} — ABNORMAL LOW (ref >59)
Globulin, Total: 2.4 g/dL (ref 1.5–4.5)
Glucose: 88 mg/dL (ref 65–99)
Potassium: 4 mmol/L (ref 3.5–5.2)
Sodium: 144 mmol/L (ref 134–144)
Total Protein: 6.5 g/dL (ref 6.0–8.5)

## 2020-03-17 ENCOUNTER — Other Ambulatory Visit: Payer: Self-pay

## 2020-03-17 ENCOUNTER — Ambulatory Visit (HOSPITAL_COMMUNITY): Payer: PPO | Attending: Cardiology

## 2020-03-17 DIAGNOSIS — I350 Nonrheumatic aortic (valve) stenosis: Secondary | ICD-10-CM | POA: Diagnosis not present

## 2020-03-17 DIAGNOSIS — R0602 Shortness of breath: Secondary | ICD-10-CM | POA: Insufficient documentation

## 2020-03-17 LAB — ECHOCARDIOGRAM COMPLETE
AR max vel: 1.41 cm2
AV Area VTI: 1.4 cm2
AV Area mean vel: 1.36 cm2
AV Mean grad: 11 mmHg
AV Peak grad: 21.4 mmHg
Ao pk vel: 2.32 m/s
Area-P 1/2: 2.68 cm2
S' Lateral: 2.8 cm

## 2020-03-18 ENCOUNTER — Encounter: Payer: Self-pay | Admitting: Physician Assistant

## 2020-03-18 ENCOUNTER — Other Ambulatory Visit: Payer: Self-pay

## 2020-03-18 DIAGNOSIS — I313 Pericardial effusion (noninflammatory): Secondary | ICD-10-CM

## 2020-03-18 DIAGNOSIS — I3139 Other pericardial effusion (noninflammatory): Secondary | ICD-10-CM

## 2020-04-02 NOTE — Progress Notes (Signed)
Cardiology Office Note   Date:  04/03/2020   ID:  [name] [name], DOB 04/22/1936, MRN [id number]  PCP:  Jarome Matin (Inactive)    No chief complaint on file.  CAD  Wt Readings from Last 3 Encounters:  04/03/20 175 lb 3.2 oz (79.5 kg)  02/26/20 176 lb (79.8 kg)  10/28/19 175 lb 1.9 oz (79.4 kg)       History of Present Illness: [name] [name] is a 84 y.o. female   with history of multivessel CAD on cath in 2009 managed medically due to poor PCI and CABG targets. Also has hypertension, HLD, OSA not on CPAP, PACs, PVCs, mild aortic stenosis.  She was seen by Dr. Okey Dupre in the past. Myalgias have been an issue on statins. She has been on and off statins. It was noted in 10/19: "She was having myalgias and fatigue was concerned it was secondary to statins. He agreed to 1 month off of statins if her symptoms improve refer for PCSK9 inhibitor therapy. LDL was at 80 and not at goal. If her symptoms do not improve with 1 month off he recommended increasing her rosuvastatin to 40 mg daily."  She did not start ezetemibe.  She tried bempedoic acid in 2020. She felt that she could not walk well and had anxiety attacks with the new medicine.   In the past, it was noted: "She blames the medicines she takes for anxiety, nausea and fatigue. She does not like taking 5-6 pills per day and feels this is a "whole lot of medicine."  Husband has dementia so she has a lot of stress at home.  Son lives near by but he has back issues. Stress is a trigger for some SHOB and palpitations.  She has tried SL NTG.   She reported some SHOB in 9/21.  Today, she feels better.  She had a knee injection.  This has helped.  TKR was recommended but she does not want to do this.   Denies : Chest pain. Dizziness. Orthopnea.  Paroxysmal nocturnal dyspnea.  Syncope.   She does try to avoid salt.  Today, no SHOB.    Past Medical History:  Diagnosis Date  . Aortic stenosis    mild  by echo in 03/2020  . Arthritis   . CAD (coronary artery disease)    a. 02/2008 Cath: EF 60%, RCA 100 w/ L->R collats, LM 20d, RI 60-70, small, LAD 60/27m, 99d, D1 50, OM1 50. Poor distal targets for CABG/no good interventional target -> medical Rx; b. Myoview in [place] in 3/11 -nl;  c. 07/2011 MV: EF 67%, mild inf ischemia->Med Rx; 02/2015 MV: EF 50%, small, mod intensity apical and apical inf rev defect-->Med Rx.  . Chronic cough    hx; ACEI stopped, possible contribution from GERD  . Chronic diastolic CHF (congestive heart failure) (HCC)    Echocardiogram 10/21: EF 60-65, no RWMA, mild LVH, Gr 2 DD, normal RVSF, severe LAE, small pericardial effusion, trivial MR, mild AS (mean 11 mmHg)  . Depression   . Dyspnea    a. 08/2013 Echo: EF 60-65%, mild AS;  b. PFTs (10/10): FVC 103%, ratio 74%. mild obstruction; c. CT chest (6/11) no evidence for interstitial lung dz. possible asthma/upper airway cough sydrome. aspirin & beta blockers- potential causes for bronchospasm;  . GERD (gastroesophageal reflux disease)   . HBV (hepatitis B virus) infection   . HCV (hepatitis C virus)   . History of PAC's    hx  .  History of PVC's    hx  . HLD (hyperlipidemia)    unable to tolerate most statins due to myalgias and elevated CK. thinks pravastatin casued a rash. Only tolerates crestor.  . Hypertensive heart disease   . IBS (irritable bowel syndrome)   . Obesity   . Osteoarthritis    knee; s/p TKR  . Osteoporosis   . Pancreatitis    hx  . Pericardial effusion    small by echo in 03/2020  . PUD (peptic ulcer disease)   . Sleep apnea    diagnosed in 2011 , did not want CPAP -     Past Surgical History:  Procedure Laterality Date  . BREAST EXCISIONAL BIOPSY Right 1990  . calcification of right breast-lumps Right 1998  . CARDIAC CATHETERIZATION  2009  . CHOLECYSTECTOMY    . hyesterectomy, unspec. area  1998  . pancreatic duct stent    . TUBAL LIGATION  1995      Current Outpatient Medications    Medication Sig Dispense Refill  . acetaminophen (TYLENOL) 325 MG tablet Per bottle as needed    . albuterol (PROVENTIL) (2.5 MG/3ML) 0.083% nebulizer solution Take 3 mLs (2.5 mg total) by nebulization every 6 (six) hours as needed for wheezing or shortness of breath. 270 mL 0  . clopidogrel (PLAVIX) 75 MG tablet Take 1 tablet (75 mg total) by mouth daily. 90 tablet 3  . ezetimibe (ZETIA) 10 MG tablet Take 10 mg by mouth daily.    . furosemide (LASIX) 40 MG tablet Take 0.5 tablet by mouth for 3 days, then take as needed for swelling 30 tablet 3  . isosorbide mononitrate (IMDUR) 30 MG 24 hr tablet Take 0.5 tablets (15 mg total) by mouth daily. 45 tablet 3  . LINZESS 145 MCG CAPS capsule Take 145 mcg by mouth as needed.    . nitroGLYCERIN (NITROSTAT) 0.4 MG SL tablet Place 1 tablet (0.4 mg total) under the tongue every 5 (five) minutes as needed. 25 tablet 3  . pantoprazole (PROTONIX) 40 MG tablet Take 40 mg by mouth daily.    . potassium chloride SA (KLOR-CON) 20 MEQ tablet Take 0.5 tablet by mouth once a day for 3 days, then take as needed with Furosemide for swelling 30 tablet 3  . telmisartan (MICARDIS) 80 MG tablet TAKE 1 TABLET BY MOUTH EVERY DAY 90 tablet 2   No current facility-administered medications for this visit.    Allergies:   Aspirin, Clarithromycin, Doxycycline, Pravastatin, Levaquin [levofloxacin in d5w], and Penicillins    Social History:  The patient  reports that she has never smoked. She has never used smokeless tobacco. She reports that she does not drink alcohol and does not use drugs.   Family History:  The patient's family history includes Arthritis/Rheumatoid in her mother; Asthma in her mother and sister; COPD in her sister; Colon cancer in her maternal aunt; Emphysema in her sister; Liver disease in her father; Lung cancer in her maternal aunt; Pancreatic cancer in her brother; Stroke in her mother and sister.    ROS:  Please see the history of present illness.    Otherwise, review of systems are positive for stress, caring for her husband.   All other systems are reviewed and negative.    PHYSICAL EXAM: VS:  BP 138/72   Pulse 69   Ht 5\' 5"  (1.651 m)   Wt 175 lb 3.2 oz (79.5 kg)   SpO2 94%   BMI 29.15 kg/m  , BMI Body  mass index is 29.15 kg/m. GEN: Well nourished, well developed, in no acute distress  HEENT: normal  Neck: no JVD, carotid bruits, or masses Cardiac: RRR; 2/6 early systolic murmur, no rubs, or gallops,no leg edema  Respiratory:  clear to auscultation bilaterally, normal work of breathing GI: soft, nontender, nondistended, + BS MS: no deformity or atrophy , 2+ DP bilaterally Skin: warm and dry, no rash Neuro:  Strength and sensation are intact Psych: euthymic mood, full affect    Recent Labs: 02/26/2020: ALT 6; BUN 18; Creatinine, Ser 0.95; Hemoglobin 12.0; NT-Pro BNP 262; Platelets 171; Potassium 4.0; Sodium 144   Lipid Panel    Component Value Date/Time   CHOL 160 05/13/2019 0830   TRIG 62 05/13/2019 0830   HDL 48 05/13/2019 0830   CHOLHDL 3.3 05/13/2019 0830   CHOLHDL 2.0 06/16/2015 0800   VLDL 12 06/16/2015 0800   LDLCALC 100 (H) 05/13/2019 0830     Other studies Reviewed: Additional studies/ records that were reviewed today with results demonstrating: Cr 0.95, K 4 in 9/21.   ASSESSMENT AND PLAN:  1. CAD: Angina controlled on medical therapy.  COntinue aggressive secondary prevention.  2. Hyperlipidemia: LDL 113. Continue statin. 3. HTN: The current medical regimen is effective;  continue present plan and medications. 4. Mild AS: No sx of severe AS.  5. Chronic diastolic heart failure: Using Lasix prn for swelling.  Can also try if [facility] returns. Small effusion noted on echo.  TO be rechecked in Jan 2022.  Needs to do daily weights.  6. We spoke a lot about her stress from her husband.     Current medicines are reviewed at length with the patient today.  The patient concerns regarding her medicines were  addressed.  The following changes have been made:  No change  Labs/ tests ordered today include:  No orders of the defined types were placed in this encounter.   Recommend 150 minutes/week of aerobic exercise Low fat, low carb, high fiber diet recommended  Disposition:   FU in 6 months   Signed, Lance Muss, MD  04/03/2020 10:42 AM    [facility] [address] [place], [place], [place]  [address] Phone: [phone]; Fax: [phone]

## 2020-04-03 ENCOUNTER — Ambulatory Visit: Payer: PPO | Admitting: Interventional Cardiology

## 2020-04-03 ENCOUNTER — Encounter: Payer: Self-pay | Admitting: Interventional Cardiology

## 2020-04-03 ENCOUNTER — Other Ambulatory Visit: Payer: Self-pay

## 2020-04-03 VITALS — BP 138/72 | HR 69 | Ht 65.0 in | Wt 175.2 lb

## 2020-04-03 DIAGNOSIS — I10 Essential (primary) hypertension: Secondary | ICD-10-CM

## 2020-04-03 DIAGNOSIS — E785 Hyperlipidemia, unspecified: Secondary | ICD-10-CM | POA: Diagnosis not present

## 2020-04-03 DIAGNOSIS — I350 Nonrheumatic aortic (valve) stenosis: Secondary | ICD-10-CM

## 2020-04-03 DIAGNOSIS — M1711 Unilateral primary osteoarthritis, right knee: Secondary | ICD-10-CM | POA: Diagnosis not present

## 2020-04-03 DIAGNOSIS — G720 Drug-induced myopathy: Secondary | ICD-10-CM | POA: Diagnosis not present

## 2020-04-03 DIAGNOSIS — T466X5A Adverse effect of antihyperlipidemic and antiarteriosclerotic drugs, initial encounter: Secondary | ICD-10-CM

## 2020-04-03 DIAGNOSIS — M542 Cervicalgia: Secondary | ICD-10-CM | POA: Diagnosis not present

## 2020-04-03 DIAGNOSIS — I5032 Chronic diastolic (congestive) heart failure: Secondary | ICD-10-CM

## 2020-04-03 DIAGNOSIS — I2510 Atherosclerotic heart disease of native coronary artery without angina pectoris: Secondary | ICD-10-CM | POA: Diagnosis not present

## 2020-04-03 NOTE — Patient Instructions (Signed)
Medication Instructions:  Your physician recommends that you continue on your current medications as directed. Please refer to the Current Medication list given to you today.  *If you need a refill on your cardiac medications before your next appointment, please call your pharmacy*   Lab Work: None  If you have labs (blood work) drawn today and your tests are completely normal, you will receive your results only by: Marland Kitchen MyChart Message (if you have MyChart) OR . A paper copy in the mail If you have any lab test that is abnormal or we need to change your treatment, we will call you to review the results.   Testing/Procedures: None  Follow-Up: At [facility], you and your health needs are our priority.  As part of our continuing mission to provide you with exceptional heart care, we have created designated Provider Care Teams.  These Care Teams include your primary Cardiologist (physician) and Advanced Practice Providers (APPs -  Physician Assistants and Nurse Practitioners) who all work together to provide you with the care you need, when you need it.  We recommend signing up for the patient portal called "MyChart".  Sign up information is provided on this After Visit Summary.  MyChart is used to connect with patients for Virtual Visits (Telemedicine).  Patients are able to view lab/test results, encounter notes, upcoming appointments, etc.  Non-urgent messages can be sent to your provider as well.   To learn more about what you can do with MyChart, go to [email or web address].    Your next appointment:   6 month(s)  The format for your next appointment:   In Person  Provider:   You may see Lance Muss, MD or one of the following Advanced Practice Providers on your designated Care Team:    Ronie Spies, PA-C  Jacolyn Reedy, PA-C    Other Instructions  Low-Sodium Eating Plan Sodium, which is an element that makes up salt, helps you maintain a healthy balance of fluids  in your body. Too much sodium can increase your blood pressure and cause fluid and waste to be held in your body. Your health care provider or dietitian may recommend following this plan if you have high blood pressure (hypertension), kidney disease, liver disease, or heart failure. Eating less sodium can help lower your blood pressure, reduce swelling, and protect your heart, liver, and kidneys. What are tips for following this plan? General guidelines  Most people on this plan should limit their sodium intake to 1,500-2,000 mg (milligrams) of sodium each day. Reading food labels   The Nutrition Facts label lists the amount of sodium in one serving of the food. If you eat more than one serving, you must multiply the listed amount of sodium by the number of servings.  Choose foods with less than 140 mg of sodium per serving.  Avoid foods with 300 mg of sodium or more per serving. Shopping  Look for lower-sodium products, often labeled as "low-sodium" or "no salt added."  Always check the sodium content even if foods are labeled as "unsalted" or "no salt added".  Buy fresh foods. ? Avoid canned foods and premade or frozen meals. ? Avoid canned, cured, or processed meats  Buy breads that have less than 80 mg of sodium per slice. Cooking  Eat more home-cooked food and less restaurant, buffet, and fast food.  Avoid adding salt when cooking. Use salt-free seasonings or herbs instead of table salt or sea salt. Check with your health care provider or pharmacist before  using salt substitutes.  Cook with plant-based oils, such as canola, sunflower, or olive oil. Meal planning  When eating at a restaurant, ask that your food be prepared with less salt or no salt, if possible.  Avoid foods that contain MSG (monosodium glutamate). MSG is sometimes added to [place] food, bouillon, and some canned foods. What foods are recommended? The items listed may not be a complete list. Talk with your  dietitian about what dietary choices are best for you. Grains Low-sodium cereals, including oats, puffed wheat and rice, and shredded wheat. Low-sodium crackers. Unsalted rice. Unsalted pasta. Low-sodium bread. Whole-grain breads and whole-grain pasta. Vegetables Fresh or frozen vegetables. "No salt added" canned vegetables. "No salt added" tomato sauce and paste. Low-sodium or reduced-sodium tomato and vegetable juice. Fruits Fresh, frozen, or canned fruit. Fruit juice. Meats and other protein foods Fresh or frozen (no salt added) meat, poultry, seafood, and fish. Low-sodium canned tuna and salmon. Unsalted nuts. Dried peas, beans, and lentils without added salt. Unsalted canned beans. Eggs. Unsalted nut butters. Dairy Milk. Soy milk. Cheese that is naturally low in sodium, such as ricotta cheese, fresh mozzarella, or Swiss cheese Low-sodium or reduced-sodium cheese. Cream cheese. Yogurt. Fats and oils Unsalted butter. Unsalted margarine with no trans fat. Vegetable oils such as canola or olive oils. Seasonings and other foods Fresh and dried herbs and spices. Salt-free seasonings. Low-sodium mustard and ketchup. Sodium-free salad dressing. Sodium-free light mayonnaise. Fresh or refrigerated horseradish. Lemon juice. Vinegar. Homemade, reduced-sodium, or low-sodium soups. Unsalted popcorn and pretzels. Low-salt or salt-free chips. What foods are not recommended? The items listed may not be a complete list. Talk with your dietitian about what dietary choices are best for you. Grains Instant hot cereals. Bread stuffing, pancake, and biscuit mixes. Croutons. Seasoned rice or pasta mixes. Noodle soup cups. Boxed or frozen macaroni and cheese. Regular salted crackers. Self-rising flour. Vegetables Sauerkraut, pickled vegetables, and relishes. Olives. [place] fries. Onion rings. Regular canned vegetables (not low-sodium or reduced-sodium). Regular canned tomato sauce and paste (not low-sodium or  reduced-sodium). Regular tomato and vegetable juice (not low-sodium or reduced-sodium). Frozen vegetables in sauces. Meats and other protein foods Meat or fish that is salted, canned, smoked, spiced, or pickled. Bacon, ham, sausage, hotdogs, corned beef, chipped beef, packaged lunch meats, salt pork, jerky, pickled herring, anchovies, regular canned tuna, sardines, salted nuts. Dairy Processed cheese and cheese spreads. Cheese curds. Blue cheese. Feta cheese. String cheese. Regular cottage cheese. Buttermilk. Canned milk. Fats and oils Salted butter. Regular margarine. Ghee. Bacon fat. Seasonings and other foods Onion salt, garlic salt, seasoned salt, table salt, and sea salt. Canned and packaged gravies. Worcestershire sauce. Tartar sauce. Barbecue sauce. Teriyaki sauce. Soy sauce, including reduced-sodium. Steak sauce. Fish sauce. Oyster sauce. Cocktail sauce. Horseradish that you find on the shelf. Regular ketchup and mustard. Meat flavorings and tenderizers. Bouillon cubes. Hot sauce and Tabasco sauce. Premade or packaged marinades. Premade or packaged taco seasonings. Relishes. Regular salad dressings. Salsa. Potato and tortilla chips. Corn chips and puffs. Salted popcorn and pretzels. Canned or dried soups. Pizza. Frozen entrees and pot pies. Summary  Eating less sodium can help lower your blood pressure, reduce swelling, and protect your heart, liver, and kidneys.  Most people on this plan should limit their sodium intake to 1,500-2,000 mg (milligrams) of sodium each day.  Canned, boxed, and frozen foods are high in sodium. Restaurant foods, fast foods, and pizza are also very high in sodium. You also get sodium by adding salt to food.  Try to cook at home, eat more fresh fruits and vegetables, and eat less fast food, canned, processed, or prepared foods. This information is not intended to replace advice given to you by your health care provider. Make sure you discuss any questions you have  with your health care provider. Document Revised: 05/05/2017 Document Reviewed: 05/16/2016 Elsevier Patient Education  2020 [employer].

## 2020-05-20 ENCOUNTER — Encounter (INDEPENDENT_AMBULATORY_CARE_PROVIDER_SITE_OTHER): Payer: Self-pay | Admitting: Ophthalmology

## 2020-05-20 ENCOUNTER — Ambulatory Visit (INDEPENDENT_AMBULATORY_CARE_PROVIDER_SITE_OTHER): Payer: PPO | Admitting: Ophthalmology

## 2020-05-20 ENCOUNTER — Other Ambulatory Visit: Payer: Self-pay

## 2020-05-20 DIAGNOSIS — H34811 Central retinal vein occlusion, right eye, with macular edema: Secondary | ICD-10-CM | POA: Diagnosis not present

## 2020-05-20 DIAGNOSIS — H2511 Age-related nuclear cataract, right eye: Secondary | ICD-10-CM

## 2020-05-20 DIAGNOSIS — H2512 Age-related nuclear cataract, left eye: Secondary | ICD-10-CM | POA: Diagnosis not present

## 2020-05-20 DIAGNOSIS — H35041 Retinal micro-aneurysms, unspecified, right eye: Secondary | ICD-10-CM

## 2020-05-20 DIAGNOSIS — H3561 Retinal hemorrhage, right eye: Secondary | ICD-10-CM | POA: Diagnosis not present

## 2020-05-20 MED ORDER — BEVACIZUMAB 2.5 MG/0.1ML IZ SOSY
2.5000 mg | PREFILLED_SYRINGE | INTRAVITREAL | Status: AC | PRN
  Administered 2020-05-20: 2.5 mg via INTRAVITREAL

## 2020-05-20 NOTE — Assessment & Plan Note (Signed)
The nature of central retinal vein occlusion was discussed with the patient including the division of types into nonischemic ischemic. The potential sequelae of ischemic central retinal vein occlusion, including macular edema, neovascularization, rubeosis iridis, and neovascular glaucoma, were discussed, and the need for frequent follow-up.  The nature of macular edema and central retinal vein occlusion was discussed. The following options were considered:  1.Observation for a period to look for spontaneous improvement, is no linger the primary therapy. One-third worsen, one-third stay unchanged, and one-third improves.  2. Anti-VEGF Therapy. ( Lucentis, Avastin or Eylea ) injected  in intravitreal fashion, initially monthly then tailored to clinical response.  3. Intravitreal steroid usage, Kenalog, or Ozurdex, usually a second line therapy or in combination with anti-Vegf therapy noted above.  4. Panretinal laser photocoagulation to cause regression of iris neovascularization, or treat retinal  non-perfusion.  5. Surgical Management may include vitrectomy with incisions of peripheral veins to trigger retino choroidal anastomosis formation. This topic presented and discussed at [facility] 2011.  Will commence and restart intravitreal Avastin OD for CME secondary to CRV O.  Patient has missed appointments in the past year due to family other family illnesses

## 2020-05-20 NOTE — Progress Notes (Addendum)
05/20/2020     CHIEF COMPLAINT Patient presents for Spots and/or Floaters   HISTORY OF PRESENT ILLNESS: [name] [name] is a 84 y.o. female who presents to the clinic today for:   HPI    Spots and/or Floaters    In right eye.  Characterized as small.  Duration of 1 week.  Since onset it is stable.  Associated symptoms include headache.  I, the attending physician,  performed the HPI with the patient and updated documentation appropriately.          Comments    11 month CRVO f\u. OCT Last Avastin inj 06/26/19  Pt c/o new onset floaters OD, starting last week. Pt states she has also been having pain in OD. Pt states pain starts in eye and moves towards ear and back of head. Denies FOL. 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 (History)      Referring physician: No referring provider defined for this encounter.  HISTORICAL INFORMATION:   Selected notes from the MEDICAL RECORD NUMBER       CURRENT MEDICATIONS: No current outpatient medications on file. (Ophthalmic Drugs)   No current facility-administered medications for this visit. (Ophthalmic Drugs)   Current Outpatient Medications (Other)  Medication Sig  . acetaminophen (TYLENOL) 325 MG tablet Per bottle as needed  . albuterol (PROVENTIL) (2.5 MG/3ML) 0.083% nebulizer solution Take 3 mLs (2.5 mg total) by nebulization every 6 (six) hours as needed for wheezing or shortness of breath.  . clopidogrel (PLAVIX) 75 MG tablet Take 1 tablet (75 mg total) by mouth daily.  Marland Kitchen ezetimibe (ZETIA) 10 MG tablet Take 10 mg by mouth daily.  . furosemide (LASIX) 40 MG tablet Take 0.5 tablet by mouth for 3 days, then take as needed for swelling  . isosorbide mononitrate (IMDUR) 30 MG 24 hr tablet Take 0.5 tablets (15 mg total) by mouth daily.  Marland Kitchen LINZESS 145 MCG CAPS capsule Take 145 mcg by mouth as needed.  . nitroGLYCERIN (NITROSTAT) 0.4 MG SL tablet Place 1  tablet (0.4 mg total) under the tongue every 5 (five) minutes as needed.  . pantoprazole (PROTONIX) 40 MG tablet Take 40 mg by mouth daily.  . potassium chloride SA (KLOR-CON) 20 MEQ tablet Take 0.5 tablet by mouth once a day for 3 days, then take as needed with Furosemide for swelling  . telmisartan (MICARDIS) 80 MG tablet TAKE 1 TABLET BY MOUTH EVERY DAY   No current facility-administered medications for this visit. (Other)      REVIEW OF SYSTEMS:    ALLERGIES Allergies  Allergen Reactions  . Aspirin Nausea Only  . Clarithromycin Other (See Comments)    unknown  . Doxycycline Nausea Only  . Pravastatin Itching    REACTION: itching  . Levaquin [Levofloxacin In D5w] Rash    Rash, HA  . Penicillins Itching and Rash    Other reaction(s): Unknown Has patient had a PCN reaction causing immediate rash, facial/tongue/throat swelling, SOB or lightheadedness with hypotension: No Has patient had a PCN reaction causing severe rash involving mucus membranes or skin necrosis: No Has patient had a PCN reaction that required hospitalization No Has patient had a PCN reaction occurring within the last 10 years: No If all of the above answers are "NO", then may proceed with Cephalosporin use.     PAST MEDICAL HISTORY Past Medical History:  Diagnosis Date  .  Aortic stenosis    mild by echo in 03/2020  . Arthritis   . CAD (coronary artery disease)    a. 02/2008 Cath: EF 60%, RCA 100 w/ L->R collats, LM 20d, RI 60-70, small, LAD 60/49m, 99d, D1 50, OM1 50. Poor distal targets for CABG/no good interventional target -> medical Rx; b. Myoview in [place] in 3/11 -nl;  c. 07/2011 MV: EF 67%, mild inf ischemia->Med Rx; 02/2015 MV: EF 50%, small, mod intensity apical and apical inf rev defect-->Med Rx.  . Chronic cough    hx; ACEI stopped, possible contribution from GERD  . Chronic diastolic CHF (congestive heart failure) (HCC)    Echocardiogram 10/21: EF 60-65, no RWMA, mild LVH, Gr 2 DD, normal RVSF,  severe LAE, small pericardial effusion, trivial MR, mild AS (mean 11 mmHg)  . Depression   . Dyspnea    a. 08/2013 Echo: EF 60-65%, mild AS;  b. PFTs (10/10): FVC 103%, ratio 74%. mild obstruction; c. CT chest (6/11) no evidence for interstitial lung dz. possible asthma/upper airway cough sydrome. aspirin & beta blockers- potential causes for bronchospasm;  . GERD (gastroesophageal reflux disease)   . HBV (hepatitis B virus) infection   . HCV (hepatitis C virus)   . History of PAC's    hx  . History of PVC's    hx  . HLD (hyperlipidemia)    unable to tolerate most statins due to myalgias and elevated CK. thinks pravastatin casued a rash. Only tolerates crestor.  . Hypertensive heart disease   . IBS (irritable bowel syndrome)   . Obesity   . Osteoarthritis    knee; s/p TKR  . Osteoporosis   . Pancreatitis    hx  . Pericardial effusion    small by echo in 03/2020  . PUD (peptic ulcer disease)   . Sleep apnea    diagnosed in 2011 , did not want CPAP -    Past Surgical History:  Procedure Laterality Date  . BREAST EXCISIONAL BIOPSY Right 1990  . calcification of right breast-lumps Right 1998  . CARDIAC CATHETERIZATION  2009  . CHOLECYSTECTOMY    . hyesterectomy, unspec. area  1998  . pancreatic duct stent    . TUBAL LIGATION  1995     FAMILY HISTORY Family History  Problem Relation Age of Onset  . Asthma Mother   . Stroke Mother        massive  . Arthritis/Rheumatoid Mother   . Liver disease Father   . Asthma Sister   . Stroke Sister   . COPD Sister   . Emphysema Sister   . Colon cancer Maternal Aunt   . Lung cancer Maternal Aunt   . Pancreatic cancer Brother     SOCIAL HISTORY Social History   Tobacco Use  . Smoking status: Never Smoker  . Smokeless tobacco: Never Used  . Tobacco comment: 2nd hand exposure x40 years   Vaping Use  . Vaping Use: Never used  Substance Use Topics  . Alcohol use: No  . Drug use: No         OPHTHALMIC EXAM:  Base Eye  Exam    Visual Acuity (ETDRS)      Right Left   Dist cc 20/60 -2 20/20   Dist ph cc NI    Correction: Glasses       Tonometry (Tonopen, 8:46 AM)      Right Left   Pressure 17 16       Pupils  Pupils Dark Light Shape React APD   Right PERRL 3 2 Round Slow None   Left PERRL 3 3 Round Minimal None       Visual Fields (Counting fingers)      Left Right    Full Full       Neuro/Psych    Oriented x3: Yes   Mood/Affect: Normal       Dilation    Both eyes: 1.0% Mydriacyl, 2.5% Phenylephrine @ 8:46 AM        Slit Lamp and Fundus Exam    External Exam      Right Left   External Normal Normal       Slit Lamp Exam      Right Left   Lids/Lashes Normal Normal   Conjunctiva/Sclera White and quiet White and quiet   Cornea Clear Clear   Anterior Chamber Deep and quiet Deep and quiet   Iris Round and reactive Round and reactive   Lens 2.5+ Nuclear sclerosis 2+ Nuclear sclerosis   Anterior Vitreous Normal Normal       Fundus Exam      Right Left   Disc Normal    C/D Ratio 0.25 0.3   Macula Cystoid macular edema Normal   Vessels  Normal          IMAGING AND PROCEDURES  Imaging and Procedures for 05/20/20  OCT, Retina - OU - Both Eyes       Right Eye Central Foveal Thickness: 723. Progression has worsened. Findings include cystoid macular edema.   Left Eye Quality was good. Scan locations included subfoveal. Central Foveal Thickness: 254. Progression has been stable.   Notes Massive CME from prior central retinal vein occlusion has recurred.  Will repeat treatment today.  We will asked patient to return in approximately 6 weeks to maintain resolution of CME by repeat examination and treat as necessary       Intravitreal Injection, Pharmacologic Agent - OD - Right Eye       Time Out 05/20/2020. 9:30 AM. Confirmed correct patient, procedure, site, and patient consented.   Anesthesia Topical anesthesia was used. Anesthetic medications included Akten  3.5%.   Procedure Preparation included Tobramycin 0.3%, 5% betadine to ocular surface. A 30 gauge needle was used.   Injection:  2.5 mg Bevacizumab (AVASTIN) 2.5mg /0.44mL SOSY   NDC: 24497-530-05, Lot: [id number]   Route: Intravitreal, Site: Right Eye  Post-op Post injection exam found visual acuity of at least counting fingers. The patient tolerated the procedure well. There were no complications. The patient received written and verbal post procedure care education. Post injection medications were not given.                 ASSESSMENT/PLAN:  Central retinal vein occlusion with macular edema of right eye The nature of central retinal vein occlusion was discussed with the patient including the division of types into nonischemic ischemic. The potential sequelae of ischemic central retinal vein occlusion, including macular edema, neovascularization, rubeosis iridis, and neovascular glaucoma, were discussed, and the need for frequent follow-up.  The nature of macular edema and central retinal vein occlusion was discussed. The following options were considered:  1.Observation for a period to look for spontaneous improvement, is no linger the primary therapy. One-third worsen, one-third stay unchanged, and one-third improves.  2. Anti-VEGF Therapy. ( Lucentis, Avastin or Eylea ) injected  in intravitreal fashion, initially monthly then tailored to clinical response.  3. Intravitreal steroid usage, Kenalog, or Ozurdex, usually a second  line therapy or in combination with anti-Vegf therapy noted above.  4. Panretinal laser photocoagulation to cause regression of iris neovascularization, or treat retinal  non-perfusion.  5. Surgical Management may include vitrectomy with incisions of peripheral veins to trigger retino choroidal anastomosis formation. This topic presented and discussed at [facility] 2011.  Will commence and restart intravitreal Avastin OD for CME secondary to CRV O.  Patient has  missed appointments in the past year due to family other family illnesses   Nuclear sclerotic cataract of right eye The nature of cataract was discussed with the patient as well as the elective nature of surgery. The patient was reassured that surgery at a later date does not put the patient at risk for a worse outcome. It was emphasized that the need for surgery is dictated by the patient's quality of life as influenced by the cataract. Patient was instructed to maintain close follow up with their general eye care doctor.  I suggest patient make a nonemergent appoint with Dr. Racheal Patches to discuss cataract surgery possibilities particularly right eye which has progressed       ICD-10-CM   1. Central retinal vein occlusion with macular edema of right eye  H34.8110 OCT, Retina - OU - Both Eyes    Intravitreal Injection, Pharmacologic Agent - OD - Right Eye    bevacizumab (AVASTIN) SOSY 2.5 mg  2. Retinal microaneurysm of right eye  H35.041   3. Nuclear sclerotic cataract of right eye  H25.11   4. Nuclear sclerotic cataract of left eye  H25.12   5. Retinal hemorrhage of right eye  H35.61     1.  Restart intravitreal Avastin OD today for CME secondary to prior CRV O, residual partially collateralized  2.  Patient encouraged to follow-up with Dr. Racheal Patches for cataract evaluation on a nonemergent basis  3.  Ophthalmic Meds Ordered this visit:  Meds ordered this encounter  Medications  . bevacizumab (AVASTIN) SOSY 2.5 mg       Return in about 5 weeks (around 06/24/2020) for dilate, OD, AVASTIN OCT.  There are no Patient Instructions on file for this visit.   Explained the diagnoses, plan, and follow up with the patient and they expressed understanding.  Patient expressed understanding of the importance of proper follow up care.   Alford Highland Dyasia Firestine M.D. Diseases & Surgery of the Retina and Vitreous [facility] 05/20/20     Abbreviations: M  myopia (nearsighted); A astigmatism; H hyperopia (farsighted); P presbyopia; Mrx spectacle prescription;  CTL contact lenses; OD right eye; OS left eye; OU both eyes  XT exotropia; ET esotropia; PEK punctate epithelial keratitis; PEE punctate epithelial erosions; DES dry eye syndrome; MGD meibomian gland dysfunction; ATs artificial tears; PFAT's preservative free artificial tears; NSC nuclear sclerotic cataract; PSC posterior subcapsular cataract; ERM epi-retinal membrane; PVD posterior vitreous detachment; RD retinal detachment; DM diabetes mellitus; DR diabetic retinopathy; NPDR non-proliferative diabetic retinopathy; PDR proliferative diabetic retinopathy; CSME clinically significant macular edema; DME diabetic macular edema; dbh dot blot hemorrhages; CWS cotton wool spot; POAG primary open angle glaucoma; C/D cup-to-disc ratio; HVF humphrey visual field; GVF goldmann visual field; OCT optical coherence tomography; IOP intraocular pressure; BRVO Branch retinal vein occlusion; CRVO central retinal vein occlusion; CRAO central retinal artery occlusion; BRAO branch retinal artery occlusion; RT retinal tear; SB scleral buckle; PPV pars plana vitrectomy; VH Vitreous hemorrhage; PRP panretinal laser photocoagulation; IVK intravitreal kenalog; VMT vitreomacular traction; MH Macular hole;  NVD neovascularization of the disc; NVE  neovascularization elsewhere; AREDS age related eye disease study; ARMD age related macular degeneration; POAG primary open angle glaucoma; EBMD epithelial/anterior basement membrane dystrophy; ACIOL anterior chamber intraocular lens; IOL intraocular lens; PCIOL posterior chamber intraocular lens; Phaco/IOL phacoemulsification with intraocular lens placement; Morton photorefractive keratectomy; LASIK laser assisted in situ keratomileusis; HTN hypertension; DM diabetes mellitus; COPD chronic obstructive pulmonary disease

## 2020-05-20 NOTE — Assessment & Plan Note (Signed)
The nature of cataract was discussed with the patient as well as the elective nature of surgery. The patient was reassured that surgery at a later date does not put the patient at risk for a worse outcome. It was emphasized that the need for surgery is dictated by the patient's quality of life as influenced by the cataract. Patient was instructed to maintain close follow up with their general eye care doctor.  I suggest patient make a nonemergent appoint with Dr. Racheal Patches to discuss cataract surgery possibilities particularly right eye which has progressed

## 2020-05-24 ENCOUNTER — Emergency Department (HOSPITAL_COMMUNITY): Admit: 2020-05-24 | Discharge: 2020-05-24 | Discharge status: Absent without leave | Payer: PPO

## 2020-05-25 DIAGNOSIS — U071 COVID-19: Secondary | ICD-10-CM | POA: Diagnosis not present

## 2020-05-25 DIAGNOSIS — Z1152 Encounter for screening for COVID-19: Secondary | ICD-10-CM | POA: Diagnosis not present

## 2020-05-25 DIAGNOSIS — J449 Chronic obstructive pulmonary disease, unspecified: Secondary | ICD-10-CM | POA: Diagnosis not present

## 2020-05-25 DIAGNOSIS — R059 Cough, unspecified: Secondary | ICD-10-CM | POA: Diagnosis not present

## 2020-05-26 ENCOUNTER — Other Ambulatory Visit: Payer: Self-pay | Admitting: Physician Assistant

## 2020-05-26 DIAGNOSIS — I10 Essential (primary) hypertension: Secondary | ICD-10-CM

## 2020-05-26 DIAGNOSIS — U071 COVID-19: Secondary | ICD-10-CM

## 2020-05-26 DIAGNOSIS — I2510 Atherosclerotic heart disease of native coronary artery without angina pectoris: Secondary | ICD-10-CM

## 2020-05-26 DIAGNOSIS — B1710 Acute hepatitis C without hepatic coma: Secondary | ICD-10-CM

## 2020-05-26 NOTE — Progress Notes (Signed)
I connected by phone with [name] [name] on 05/26/2020 at 9:32 AM to discuss the potential use of a new treatment for mild to moderate COVID-19 viral infection in non-hospitalized patients.  This patient is a 84 y.o. female that meets the FDA criteria for Emergency Use Authorization of COVID monoclonal antibody sotrovimab, casirivimab/imdevimab or bamlamivimab/estevimab.  Has a (+) direct SARS-CoV-2 viral test result  Has mild or moderate COVID-19   Is NOT hospitalized due to COVID-19  Is within 10 days of symptom onset  Has at least one of the high risk factor(s) for progression to severe COVID-19 and/or hospitalization as defined in EUA.  Specific high risk criteria : Older age (>/= 82 yo), BMI > 25, Cardiovascular disease or hypertension and Other high risk medical condition per CDC:  high SVI   I have spoken and communicated the following to the patient or parent/caregiver regarding COVID monoclonal antibody treatment:  1. FDA has authorized the emergency use for the treatment of mild to moderate COVID-19 in adults and pediatric patients with positive results of direct SARS-CoV-2 viral testing who are 79 years of age and older weighing at least 40 kg, and who are at high risk for progressing to severe COVID-19 and/or hospitalization.  2. The significant known and potential risks and benefits of COVID monoclonal antibody, and the extent to which such potential risks and benefits are unknown.  3. Information on available alternative treatments and the risks and benefits of those alternatives, including clinical trials.  4. Patients treated with COVID monoclonal antibody should continue to self-isolate and use infection control measures (e.g., wear mask, isolate, social distance, avoid sharing personal items, clean and disinfect "high touch" surfaces, and frequent handwashing) according to CDC guidelines.   5. The patient or parent/caregiver has the option to accept or refuse COVID  monoclonal antibody treatment.  After reviewing this information with the patient, the patient has agreed to receive one of the available covid 19 monoclonal antibodies and will be provided an appropriate fact sheet prior to infusion.  Sx onset 12/18. Set up for infusion on 12/22 @ 10:30am. Directions given to [facility] Pt is aware that insurance will be charged an infusion fee. Pt is vaccinated. Will bring in copy of + test from Dr. Norval Gable office.   Cline Crock 05/26/2020 9:32 AM

## 2020-05-27 ENCOUNTER — Ambulatory Visit (HOSPITAL_COMMUNITY): Payer: PPO

## 2020-05-29 ENCOUNTER — Other Ambulatory Visit: Payer: Self-pay

## 2020-05-29 ENCOUNTER — Emergency Department (HOSPITAL_COMMUNITY): Payer: PPO

## 2020-05-29 ENCOUNTER — Encounter (HOSPITAL_COMMUNITY): Payer: Self-pay

## 2020-05-29 ENCOUNTER — Emergency Department (HOSPITAL_COMMUNITY)
Admission: EM | Admit: 2020-05-29 | Discharge: 2020-05-29 | Disposition: A | Discharge status: Home / Self Care | Payer: PPO | Attending: Emergency Medicine | Admitting: Emergency Medicine

## 2020-05-29 DIAGNOSIS — U071 COVID-19: Secondary | ICD-10-CM | POA: Diagnosis not present

## 2020-05-29 DIAGNOSIS — I2510 Atherosclerotic heart disease of native coronary artery without angina pectoris: Secondary | ICD-10-CM | POA: Diagnosis not present

## 2020-05-29 DIAGNOSIS — Z79899 Other long term (current) drug therapy: Secondary | ICD-10-CM | POA: Insufficient documentation

## 2020-05-29 DIAGNOSIS — I5032 Chronic diastolic (congestive) heart failure: Secondary | ICD-10-CM | POA: Diagnosis not present

## 2020-05-29 DIAGNOSIS — Z23 Encounter for immunization: Secondary | ICD-10-CM | POA: Insufficient documentation

## 2020-05-29 DIAGNOSIS — I10 Essential (primary) hypertension: Secondary | ICD-10-CM | POA: Diagnosis not present

## 2020-05-29 DIAGNOSIS — J9811 Atelectasis: Secondary | ICD-10-CM | POA: Diagnosis not present

## 2020-05-29 DIAGNOSIS — I110 Hypertensive heart disease with heart failure: Secondary | ICD-10-CM | POA: Diagnosis not present

## 2020-05-29 DIAGNOSIS — R509 Fever, unspecified: Secondary | ICD-10-CM | POA: Diagnosis present

## 2020-05-29 LAB — CBC WITH DIFFERENTIAL/PLATELET
Abs Immature Granulocytes: 0.03 10*3/uL (ref 0.00–0.07)
Basophils Absolute: 0 10*3/uL (ref 0.0–0.1)
Basophils Relative: 0 %
Eosinophils Absolute: 0 10*3/uL (ref 0.0–0.5)
Eosinophils Relative: 0 %
HCT: 35.7 % — ABNORMAL LOW (ref 36.0–46.0)
Hemoglobin: 11.7 g/dL — ABNORMAL LOW (ref 12.0–15.0)
Immature Granulocytes: 0 %
Lymphocytes Relative: 17 %
Lymphs Abs: 1.2 10*3/uL (ref 0.7–4.0)
MCH: 29.6 pg (ref 26.0–34.0)
MCHC: 32.8 g/dL (ref 30.0–36.0)
MCV: 90.4 fL (ref 80.0–100.0)
Monocytes Absolute: 0.7 10*3/uL (ref 0.1–1.0)
Monocytes Relative: 11 %
Neutro Abs: 5.1 10*3/uL (ref 1.7–7.7)
Neutrophils Relative %: 72 %
Platelets: 131 10*3/uL — ABNORMAL LOW (ref 150–400)
RBC: 3.95 MIL/uL (ref 3.87–5.11)
RDW: 13 % (ref 11.5–15.5)
WBC: 7 10*3/uL (ref 4.0–10.5)
nRBC: 0 % (ref 0.0–0.2)

## 2020-05-29 LAB — COMPREHENSIVE METABOLIC PANEL
ALT: 10 U/L (ref 0–44)
AST: 18 U/L (ref 15–41)
Albumin: 3.4 g/dL — ABNORMAL LOW (ref 3.5–5.0)
Alkaline Phosphatase: 54 U/L (ref 38–126)
Anion gap: 11 (ref 5–15)
BUN: 10 mg/dL (ref 8–23)
CO2: 21 mmol/L — ABNORMAL LOW (ref 22–32)
Calcium: 8 mg/dL — ABNORMAL LOW (ref 8.9–10.3)
Chloride: 102 mmol/L (ref 98–111)
Creatinine, Ser: 0.95 mg/dL (ref 0.44–1.00)
GFR, Estimated: 60 mL/min — ABNORMAL LOW (ref >60)
Glucose, Bld: 103 mg/dL — ABNORMAL HIGH (ref 70–99)
Potassium: 4.3 mmol/L (ref 3.5–5.1)
Sodium: 134 mmol/L — ABNORMAL LOW (ref 135–145)
Total Bilirubin: 0.8 mg/dL (ref 0.3–1.2)
Total Protein: 6.4 g/dL — ABNORMAL LOW (ref 6.5–8.1)

## 2020-05-29 MED ORDER — SODIUM CHLORIDE 0.9 % IV SOLN
Freq: Once | INTRAVENOUS | Status: AC
  Filled 2020-05-29: qty 700

## 2020-05-29 MED ORDER — EPINEPHRINE 0.3 MG/0.3ML IJ SOAJ: 0.3000 mg | Freq: Once | INTRAMUSCULAR | Status: DC | PRN

## 2020-05-29 MED ORDER — SODIUM CHLORIDE 0.9 % IV SOLN: INTRAVENOUS | Status: DC | PRN

## 2020-05-29 MED ORDER — METHYLPREDNISOLONE SODIUM SUCC 125 MG IJ SOLR: 125.0000 mg | Freq: Once | INTRAMUSCULAR | Status: DC | PRN

## 2020-05-29 MED ORDER — ALBUTEROL SULFATE HFA 108 (90 BASE) MCG/ACT IN AERS: 2.0000 | INHALATION_SPRAY | Freq: Once | RESPIRATORY_TRACT | Status: DC | PRN

## 2020-05-29 MED ORDER — SODIUM CHLORIDE 0.9 % IV SOLN: 1200.0000 mg | Freq: Once | INTRAVENOUS | Status: DC

## 2020-05-29 MED ORDER — DIPHENHYDRAMINE HCL 50 MG/ML IJ SOLN: 50.0000 mg | Freq: Once | INTRAMUSCULAR | Status: DC | PRN

## 2020-05-29 MED ORDER — ACETAMINOPHEN 500 MG PO TABS
1000.0000 mg | ORAL_TABLET | Freq: Once | ORAL | Status: AC
  Administered 2020-05-29: 1000 mg via ORAL
  Filled 2020-05-29: qty 2

## 2020-05-29 MED ORDER — FAMOTIDINE IN NACL 20-0.9 MG/50ML-% IV SOLN: 20.0000 mg | Freq: Once | INTRAVENOUS | Status: DC | PRN

## 2020-05-29 NOTE — ED Provider Notes (Signed)
[facility]-EMERGENCY DEPT Provider Note   CSN: [id number] Arrival date & time: 05/29/20  1416     History Chief Complaint  Patient presents with  . Chills    [name] [name] is a 84 y.o. female.  Patient is an 84 year old female with a history of CHF, CAD, aortic stenosis, hypertension, hyperlipidemia and small pericardial effusion on 1021 who is presenting today with general malaise, fever in the setting of being Covid positive.  Patient reports that on Monday she started having headache, chills and just feeling tired.  Patient reports since Monday she has just wanted to sleep all the time.  She denies any nausea, vomiting or diarrhea.  She denies chest pain or palpitations.  Patient's daughter was on the [place] reported when she talked with her this morning it seemed like she was having trouble breathing but patient reports that she was not having trouble breathing she was just tired and wanted to go lay down.  She denies any dyspnea at this time.  She has had a cough but denies sputum production.  She has been drinking plenty of fluids at home and resting.  Husband has also been positive for Covid.  Patient was supposed to get monoclonal antibody yesterday but reported that her husband has dementia and she did not have time to go and receive it.  She has received the first 2 Covid vaccines but did not receive a booster.  She is currently on day 5 of illness.  She reports her family wanted her to come and be checked out and so that is why she came today.  The history is provided by the patient.       Past Medical History:  Diagnosis Date  . Aortic stenosis    mild by echo in 03/2020  . Arthritis   . CAD (coronary artery disease)    a. 02/2008 Cath: EF 60%, RCA 100 w/ L->R collats, LM 20d, RI 60-70, small, LAD 60/27m, 99d, D1 50, OM1 50. Poor distal targets for CABG/no good interventional target -> medical Rx; b. Myoview in [place] in 3/11 -nl;  c. 07/2011 MV: EF  67%, mild inf ischemia->Med Rx; 02/2015 MV: EF 50%, small, mod intensity apical and apical inf rev defect-->Med Rx.  . Chronic cough    hx; ACEI stopped, possible contribution from GERD  . Chronic diastolic CHF (congestive heart failure) (HCC)    Echocardiogram 10/21: EF 60-65, no RWMA, mild LVH, Gr 2 DD, normal RVSF, severe LAE, small pericardial effusion, trivial MR, mild AS (mean 11 mmHg)  . Depression   . Dyspnea    a. 08/2013 Echo: EF 60-65%, mild AS;  b. PFTs (10/10): FVC 103%, ratio 74%. mild obstruction; c. CT chest (6/11) no evidence for interstitial lung dz. possible asthma/upper airway cough sydrome. aspirin & beta blockers- potential causes for bronchospasm;  . GERD (gastroesophageal reflux disease)   . HBV (hepatitis B virus) infection   . HCV (hepatitis C virus)   . History of PAC's    hx  . History of PVC's    hx  . HLD (hyperlipidemia)    unable to tolerate most statins due to myalgias and elevated CK. thinks pravastatin casued a rash. Only tolerates crestor.  . Hypertensive heart disease   . IBS (irritable bowel syndrome)   . Obesity   . Osteoarthritis    knee; s/p TKR  . Osteoporosis   . Pancreatitis    hx  . Pericardial effusion    small by  echo in 03/2020  . PUD (peptic ulcer disease)   . Sleep apnea    diagnosed in 2011 , did not want CPAP -     Patient Active Problem List   Diagnosis Date Noted  . Central retinal vein occlusion with macular edema of right eye 05/20/2020  . Retinal microaneurysm of right eye 05/20/2020  . Nuclear sclerotic cataract of right eye 05/20/2020  . Nuclear sclerotic cataract of left eye 05/20/2020  . Retinal hemorrhage of right eye 05/20/2020  . Statin myopathy 03/08/2019  . Mild aortic stenosis 07/13/2017  . Pneumonia due to organism 07/04/2016  . Hypertensive heart disease   . CAD (coronary artery disease)   . Hyperlipidemia LDL goal <70   . Morbid obesity due to excess calories (HCC)   . Dyspnea 12/23/2013  . Sleep apnea    . PVC (premature ventricular contraction)   . Rheumatoid arthritis(714.0) 12/29/2011  . Shoulder pain 08/29/2011  . Cough variant asthma vs UACS with VCD  12/10/2009  . Upper airway cough syndrome 02/13/2009  . MURMUR 08/21/2008  . Coronary artery disease involving native coronary artery of native heart without angina pectoris 05/19/2008  . KNEE PAIN 05/26/2007  . HEPATITIS C 02/06/2007  . Hyperlipidemia 02/06/2007  . DEPRESSION 02/06/2007  . Essential hypertension 02/06/2007  . IRRITABLE BOWEL SYNDROME 02/06/2007  . OSTEOPOROSIS 02/06/2007  . PANCREATITIS, HX OF 02/06/2007  . ANXIETY 12/22/2006  . GERD 12/22/2006  . HEPATITIS B, HX OF 12/22/2006    Past Surgical History:  Procedure Laterality Date  . BREAST EXCISIONAL BIOPSY Right 1990  . calcification of right breast-lumps Right 1998  . CARDIAC CATHETERIZATION  2009  . CHOLECYSTECTOMY    . hyesterectomy, unspec. area  1998  . pancreatic duct stent    . TUBAL LIGATION  1995      OB History   No obstetric history on file.     Family History  Problem Relation Age of Onset  . Asthma Mother   . Stroke Mother        massive  . Arthritis/Rheumatoid Mother   . Liver disease Father   . Asthma Sister   . Stroke Sister   . COPD Sister   . Emphysema Sister   . Colon cancer Maternal Aunt   . Lung cancer Maternal Aunt   . Pancreatic cancer Brother     Social History   Tobacco Use  . Smoking status: Never Smoker  . Smokeless tobacco: Never Used  . Tobacco comment: 2nd hand exposure x40 years   Vaping Use  . Vaping Use: Never used  Substance Use Topics  . Alcohol use: No  . Drug use: No    Home Medications Prior to Admission medications   Medication Sig Start Date End Date Taking? Authorizing Provider  acetaminophen (TYLENOL) 325 MG tablet Per bottle as needed    [provider]  albuterol (PROVENTIL) (2.5 MG/3ML) 0.083% nebulizer solution Take 3 mLs (2.5 mg total) by nebulization every 6 (six) hours  as needed for wheezing or shortness of breath. 05/31/16   Nyoka Cowden, MD  clopidogrel (PLAVIX) 75 MG tablet Take 1 tablet (75 mg total) by mouth daily. 07/17/19   Corky Crafts, MD  ezetimibe (ZETIA) 10 MG tablet Take 10 mg by mouth daily.    [provider]  furosemide (LASIX) 40 MG tablet Take 0.5 tablet by mouth for 3 days, then take as needed for swelling 02/26/20   Tereso Newcomer T, PA-C  isosorbide mononitrate (IMDUR)  30 MG 24 hr tablet Take 0.5 tablets (15 mg total) by mouth daily. 02/26/20   Tereso Newcomer T, PA-C  LINZESS 145 MCG CAPS capsule Take 145 mcg by mouth as needed. 02/09/19   [provider]  nitroGLYCERIN (NITROSTAT) 0.4 MG SL tablet Place 1 tablet (0.4 mg total) under the tongue every 5 (five) minutes as needed. 10/29/19   Corky Crafts, MD  pantoprazole (PROTONIX) 40 MG tablet Take 40 mg by mouth daily. 02/21/20   [provider]  potassium chloride SA (KLOR-CON) 20 MEQ tablet Take 0.5 tablet by mouth once a day for 3 days, then take as needed with Furosemide for swelling 02/26/20   Tereso Newcomer T, PA-C  telmisartan (MICARDIS) 80 MG tablet TAKE 1 TABLET BY MOUTH EVERY DAY 01/20/20   Corky Crafts, MD    Allergies    Aspirin, Clarithromycin, Doxycycline, Pravastatin, Levaquin [levofloxacin in d5w], and Penicillins  Review of Systems   Review of Systems  All other systems reviewed and are negative.   Physical Exam Updated Vital Signs BP (!) 152/82 (BP Location: Right Arm)   Pulse 84   Temp 100 F (37.8 C) (Oral)   Resp 18   SpO2 98%   Physical Exam Vitals and nursing note reviewed.  Constitutional:      General: She is not in acute distress.    Appearance: Normal appearance. She is well-developed and well-nourished. She is obese.  HENT:     Head: Normocephalic and atraumatic.     Mouth/Throat:     Mouth: Mucous membranes are moist.  Eyes:     Extraocular Movements: EOM normal.     Pupils: Pupils are equal, round,  and reactive to light.  Cardiovascular:     Rate and Rhythm: Normal rate and regular rhythm.     Pulses: Normal pulses and intact distal pulses.     Heart sounds: Normal heart sounds. No murmur heard. No friction rub.  Pulmonary:     Effort: Pulmonary effort is normal.     Breath sounds: Normal breath sounds. No wheezing or rales.  Abdominal:     General: Bowel sounds are normal. There is no distension.     Palpations: Abdomen is soft.     Tenderness: There is no abdominal tenderness. There is no guarding or rebound.  Musculoskeletal:        General: No tenderness. Normal range of motion.     Right lower leg: No edema.     Left lower leg: No edema.     Comments: No edema  Skin:    General: Skin is warm and dry.     Capillary Refill: Capillary refill takes less than 2 seconds.     Findings: No rash.  Neurological:     Mental Status: She is alert and oriented to person, place, and time. Mental status is at baseline.     Cranial Nerves: No cranial nerve deficit.  Psychiatric:        Mood and Affect: Mood and affect and mood normal.        Behavior: Behavior normal.        Thought Content: Thought content normal.     ED Results / Procedures / Treatments   Labs (all labs ordered are listed, but only abnormal results are displayed) Labs Reviewed  CBC WITH DIFFERENTIAL/PLATELET - Abnormal; Notable for the following components:      Result Value   Hemoglobin 11.7 (*)    HCT 35.7 (*)  Platelets 131 (*)    All other components within normal limits  COMPREHENSIVE METABOLIC PANEL - Abnormal; Notable for the following components:   Sodium 134 (*)    CO2 21 (*)    Glucose, Bld 103 (*)    Calcium 8.0 (*)    Total Protein 6.4 (*)    Albumin 3.4 (*)    GFR, Estimated 60 (*)    All other components within normal limits    EKG EKG Interpretation  Date/Time:  Friday May 29 2020 16:07:25 EST Ventricular Rate:  67 PR Interval:    QRS Duration: 95 QT Interval:  364 QTC  Calculation: 385 R Axis:   -14 Text Interpretation: Sinus rhythm Prolonged PR interval Probable left ventricular hypertrophy Inferior infarct, age indeterminate No significant change since last tracing Confirmed by Gwyneth Sprout ([id number]) on 05/29/2020 4:57:57 PM   Radiology DG Chest Port 1 View  Result Date: 05/29/2020 CLINICAL DATA:  COVID-19 positivity with chills EXAM: PORTABLE CHEST 1 VIEW COMPARISON:  07/04/2016 FINDINGS: Cardiac shadow is mildly enlarged. Aortic calcifications are noted. Patchy atelectatic changes are noted along the minor fissure in the right mid lung. No other focal abnormality is noted. IMPRESSION: Mild right mid lung atelectasis. Electronically Signed   By: Alcide Clever M.D.   On: 05/29/2020 16:10    Procedures Procedures (including critical care time)  Medications Ordered in ED Medications - No data to display  ED Course  I have reviewed the triage vital signs and the nursing notes.  Pertinent labs & imaging results that were available during my care of the patient were reviewed by me and considered in my medical decision making (see chart for details).    MDM Rules/Calculators/A&P                          Patient presenting today with known Covid positive and currently symptomatic now for 5 days who is here with chills and fatigue.  Patient's daughter was on the phone and reported that she was concerned she was having trouble breathing however patient denies feelings of dyspnea and reports she is just felt sick and tired.  She denies any anorexia and reports she has been eating and drinking still.  She has had no falls or near syncope.  She is febrile today at 100 but otherwise has reassuring vital signs.  Patient's oxygen saturation did not drop below 96% during exam.  Will check an EKG, chest x-ray and routine blood work.  Patient however is well-appearing.  Did discuss with her possible monoclonal antibody infusion here and she reports she would like to  think about it. 5:11 PM CBC, CMP and CXR without acute findings except for mild right atelectasis.  Discussed with pt and she wished to receive monoclonal antibodies tonight.  Sats remain >90%.  8:47 PM Patient tolerated monoclonal antibody infusion well. She is satting 95% or greater even with ambulating. Feel that she is stable for discharge but given return precautions  MDM Number of Diagnoses or Management Options   Amount and/or Complexity of Data Reviewed Clinical lab tests: ordered and reviewed Tests in the radiology section of CPT: ordered and reviewed Tests in the medicine section of CPT: ordered and reviewed Decide to obtain previous medical records or to obtain history from someone other than the patient: yes Obtain history from someone other than the patient: yes Review and summarize past medical records: yes Discuss the patient with other providers: no Independent visualization of  images, tracings, or specimens: yes  Risk of Complications, Morbidity, and/or Mortality Presenting problems: moderate Diagnostic procedures: low Management options: moderate  Patient Progress Patient progress: stable    [name] [name] was evaluated in Emergency Department on 05/29/2020 for the symptoms described in the history of present illness. She was evaluated in the context of the global COVID-19 pandemic, which necessitated consideration that the patient might be at risk for infection with the SARS-CoV-2 virus that causes COVID-19. Institutional protocols and algorithms that pertain to the evaluation of patients at risk for COVID-19 are in a state of rapid change based on information released by regulatory bodies including the CDC and federal and state organizations. These policies and algorithms were followed during the patient's care in the ED.  Final Clinical Impression(s) / ED Diagnoses Final diagnoses:  COVID    Rx / DC Orders ED Discharge Orders    None        Gwyneth Sprout, MD 05/29/20 2048

## 2020-05-29 NOTE — Discharge Instructions (Signed)
The blood work and chest x-ray today looked okay. You did receive the monoclonal antibody infusion which is supposed to decrease the severity of symptoms. However over the next week you will most likely still have symptoms of Covid. You can be tired, have fever and have decreased appetite. Make sure you are still drinking plenty of fluids and taking Tylenol as needed. Get plenty of rest. However if at anytime you start developing shortness of breath, confusion, generalized weakness please return to the hospital. Today your oxygen looked great and was higher than 95% throughout her stay.

## 2020-05-29 NOTE — ED Triage Notes (Signed)
Pt arrived via walk in, c/o chills at home, "inabilty to get warm". COVID (+) 12/20. Denies any CP or SOB. States last tylenol was early this morning.

## 2020-05-31 ENCOUNTER — Telehealth (HOSPITAL_COMMUNITY): Payer: Self-pay | Admitting: Adult Health

## 2020-05-31 NOTE — Telephone Encounter (Signed)
Called patient about MAB therapy for COVID 19.  She notes that she went to the ER on 12/24 and received it then.  She is feeling moderately well.  She denies any new issues.  I wished her a continued recovery and happy holiday.  Lillard Anes, NP

## 2020-06-17 ENCOUNTER — Encounter: Payer: Self-pay | Admitting: Physician Assistant

## 2020-06-17 ENCOUNTER — Other Ambulatory Visit: Payer: Self-pay

## 2020-06-17 ENCOUNTER — Ambulatory Visit (HOSPITAL_COMMUNITY): Payer: PPO | Attending: Cardiology

## 2020-06-17 ENCOUNTER — Telehealth: Payer: Self-pay

## 2020-06-17 DIAGNOSIS — I313 Pericardial effusion (noninflammatory): Secondary | ICD-10-CM

## 2020-06-17 DIAGNOSIS — I3139 Other pericardial effusion (noninflammatory): Secondary | ICD-10-CM

## 2020-06-17 LAB — ECHOCARDIOGRAM LIMITED
AR max vel: 1.44 cm2
AV Area VTI: 1.62 cm2
AV Area mean vel: 1.38 cm2
AV Mean grad: 8.5 mmHg
AV Peak grad: 17.7 mmHg
Ao pk vel: 2.11 m/s
Area-P 1/2: 4.15 cm2
S' Lateral: 2.6 cm

## 2020-06-17 NOTE — Telephone Encounter (Signed)
-----   Message from Beatrice Lecher, [place] sent at 06/17/2020  4:11 PM EST ----- The echocardiogram shows normal EF.  The small pericardial effusion seen on the last echocardiogram has resolved.  PLAN:  - Continue current medications/treatment plan and follow up as scheduled.  Tereso Newcomer, PA-C    06/17/2020 4:08 PM

## 2020-06-17 NOTE — Telephone Encounter (Signed)
The patient has been notified of the result and verbalized understanding.  All questions (if any) were answered. Leanord Hawking, RN 06/17/2020 4:20 PM

## 2020-06-22 DIAGNOSIS — G4733 Obstructive sleep apnea (adult) (pediatric): Secondary | ICD-10-CM | POA: Diagnosis not present

## 2020-06-22 DIAGNOSIS — I2510 Atherosclerotic heart disease of native coronary artery without angina pectoris: Secondary | ICD-10-CM | POA: Diagnosis not present

## 2020-06-22 DIAGNOSIS — Z8616 Personal history of COVID-19: Secondary | ICD-10-CM | POA: Diagnosis not present

## 2020-06-22 DIAGNOSIS — I10 Essential (primary) hypertension: Secondary | ICD-10-CM | POA: Diagnosis not present

## 2020-06-22 DIAGNOSIS — E785 Hyperlipidemia, unspecified: Secondary | ICD-10-CM | POA: Diagnosis not present

## 2020-06-22 DIAGNOSIS — K219 Gastro-esophageal reflux disease without esophagitis: Secondary | ICD-10-CM | POA: Diagnosis not present

## 2020-06-22 DIAGNOSIS — J449 Chronic obstructive pulmonary disease, unspecified: Secondary | ICD-10-CM | POA: Diagnosis not present

## 2020-06-24 ENCOUNTER — Encounter (INDEPENDENT_AMBULATORY_CARE_PROVIDER_SITE_OTHER): Payer: PPO | Admitting: Ophthalmology

## 2020-06-29 NOTE — Progress Notes (Signed)
Cardiology Office Note   Date:  06/30/2020   ID:  [name] [name], [name] [name] April 11, 1936, [name]  PCP:  [name] [name] (Inactive)    No chief complaint on file.    Wt Readings from Last 3 Encounters:  06/30/20 172 lb 6.4 oz (78.2 kg)  04/03/20 175 lb 3.2 oz (79.5 kg)  02/26/20 176 lb (79.8 kg)       History of Present Illness: [name] [name] is a 85 y.o. female  with history of multivessel CAD on cath in 2009 managed medically due to poor PCI and CABG targets. Also has hypertension, HLD, OSA not on CPAP, PACs, PVCs, mild aortic stenosis.  She was seen by Dr. Okey [name] in the past. Myalgias have been an issue on statins. She has been on and off statins. It was noted in 10/19: "She was having myalgias and fatigue was concerned it was secondary to statins. He agreed to 1 month off of statins if her symptoms improve refer for PCSK9 inhibitor therapy. LDL was at 80 and not at goal. If her symptoms do not improve with 1 month off he recommended increasing her rosuvastatin to 40 mg daily."  She did not start ezetemibe.  She triedbempedoic acidin 2020. She felt that she could not walk well and had anxiety attacks with the new medicine.  In the past, it was noted: "She blames the medicines she takes for anxiety, nausea and fatigue. She does not like taking 5-6 pills per day and feels this is a "whole lot of medicine."  Husband has dementia so she has a lot of stress at home.  Son lives near by but he has back issues. Stress is a trigger for some SHOB and palpitations.  She has tried SL NTG.   She reported some SHOB in 9/21.  She had COVID in 12/21 and had an antibody infusion.  BPs at home are in the 100/60 range.    Since COVID ( despite 2 vaccinations, no booster), she has had more DOE.    Plans to get booster in 2/22.    Past Medical History:  Diagnosis Date  . Aortic stenosis    mild by echo in 03/2020  . Arthritis   . CAD (coronary  artery disease)    a. 02/2008 Cath: EF 60%, RCA 100 w/ L->R collats, LM 20d, RI 60-70, small, LAD 60/64m, 99d, D1 50, OM1 50. Poor distal targets for CABG/no good interventional target -> medical Rx; b. Myoview in [place] in 3/11 -nl;  c. 07/2011 MV: EF 67%, mild inf ischemia->Med Rx; 02/2015 MV: EF 50%, small, mod intensity apical and apical inf rev defect-->Med Rx.  . Chronic cough    hx; ACEI stopped, possible contribution from GERD  . Chronic diastolic CHF (congestive heart failure) (HCC)    Echocardiogram 10/21: EF 60-65, no RWMA, mild LVH, Gr 2 DD, normal RVSF, severe LAE, small pericardial effusion, trivial MR, mild AS (mean 11 mmHg)  . Depression   . Dyspnea    a. 08/2013 Echo: EF 60-65%, mild AS;  b. PFTs (10/10): FVC 103%, ratio 74%. mild obstruction; c. CT chest (6/11) no evidence for interstitial lung dz. possible asthma/upper airway cough sydrome. aspirin & beta blockers- potential causes for bronchospasm;  . GERD (gastroesophageal reflux disease)   . HBV (hepatitis B virus) infection   . HCV (hepatitis C virus)   . History of PAC's    hx  . History of PVC's    hx  . HLD (hyperlipidemia)  unable to tolerate most statins due to myalgias and elevated CK. thinks pravastatin casued a rash. Only tolerates crestor.  . Hypertensive heart disease   . IBS (irritable bowel syndrome)   . Obesity   . Osteoarthritis    knee; s/p TKR  . Osteoporosis   . Pancreatitis    hx  . Pericardial effusion    small by echo in 03/2020 // Limited Echocardiogram 1/22: EF 60-65, no RWMA, mod LVH, Gr 1 DD, normal RVSF, mild LAE, mild MR, mild AS mean 8.5 mmHg), no pericardial effusion     . PUD (peptic ulcer disease)   . Sleep apnea    diagnosed in 2011 , did not want CPAP -     Past Surgical History:  Procedure Laterality Date  . BREAST EXCISIONAL BIOPSY Right 1990  . calcification of right breast-lumps Right 1998  . CARDIAC CATHETERIZATION  2009  . CHOLECYSTECTOMY    . hyesterectomy, unspec. area   1998  . pancreatic duct stent    . TUBAL LIGATION  1995      Current Outpatient Medications  Medication Sig Dispense Refill  . acetaminophen (TYLENOL) 325 MG tablet Per bottle as needed    . albuterol (PROVENTIL) (2.5 MG/3ML) 0.083% nebulizer solution Take 3 mLs (2.5 mg total) by nebulization every 6 (six) hours as needed for wheezing or shortness of breath. 270 mL 0  . clopidogrel (PLAVIX) 75 MG tablet Take 1 tablet (75 mg total) by mouth daily. 90 tablet 3  . ezetimibe (ZETIA) 10 MG tablet Take 10 mg by mouth daily.    . furosemide (LASIX) 40 MG tablet Take 0.5 tablet by mouth for 3 days, then take as needed for swelling 30 tablet 3  . isosorbide mononitrate (IMDUR) 30 MG 24 hr tablet Take 0.5 tablets (15 mg total) by mouth daily. 45 tablet 3  . LINZESS 145 MCG CAPS capsule Take 145 mcg by mouth as needed.    . nitroGLYCERIN (NITROSTAT) 0.4 MG SL tablet Place 1 tablet (0.4 mg total) under the tongue every 5 (five) minutes as needed. 25 tablet 3  . pantoprazole (PROTONIX) 40 MG tablet Take 40 mg by mouth daily.    Marland Kitchen telmisartan (MICARDIS) 80 MG tablet TAKE 1 TABLET BY MOUTH EVERY DAY 90 tablet 2  . Vitamin D, Ergocalciferol, (DRISDOL) 1.25 MG (50000 UNIT) CAPS capsule Take 50,000 Units by mouth once a week.     No current facility-administered medications for this visit.    Allergies:   Aspirin, Clarithromycin, Doxycycline, Pravastatin, Levaquin [levofloxacin in d5w], and Penicillins    Social History:  The patient  reports that she has never smoked. She has never used smokeless tobacco. She reports that she does not drink alcohol and does not use drugs.   Family History:  The patient's family history includes Arthritis/Rheumatoid in her mother; Asthma in her mother and sister; COPD in her sister; Colon cancer in her maternal aunt; Emphysema in her sister; Liver disease in her father; Lung cancer in her maternal aunt; Pancreatic cancer in her brother; Stroke in her mother and sister.     ROS:  Please see the history of present illness.   Otherwise, review of systems are positive for DOE- worse since COVID.   All other systems are reviewed and negative.    PHYSICAL EXAM: VS:  BP (!) 140/100   Pulse 82   Ht 5\' 1"  (1.549 m)   Wt 172 lb 6.4 oz (78.2 kg)   SpO2 97%   BMI  32.57 kg/m  , BMI Body mass index is 32.57 kg/m. GEN: Well nourished, well developed, in no acute distress  HEENT: normal  Neck: no JVD, carotid bruits, or masses Cardiac: RRR; 2/6 systolic murmurs, rubs, or gallops,no edema  Respiratory:  clear to auscultation bilaterally, normal work of breathing GI: soft, nontender, nondistended, + BS MS: no deformity or atrophy  Skin: warm and dry, no rash Neuro:  Strength and sensation are intact Psych: euthymic mood, full affect   EKG:   The ekg ordered today demonstrates NSR, inferior T wave inversion, no change from ECG in Dec 2021.   Recent Labs: 02/26/2020: NT-Pro BNP 262 05/29/2020: ALT 10; BUN 10; Creatinine, Ser 0.95; Hemoglobin 11.7; Platelets 131; Potassium 4.3; Sodium 134   Lipid Panel    Component Value Date/Time   CHOL 160 05/13/2019 0830   TRIG 62 05/13/2019 0830   HDL 48 05/13/2019 0830   CHOLHDL 3.3 05/13/2019 0830   CHOLHDL 2.0 06/16/2015 0800   VLDL 12 06/16/2015 0800   LDLCALC 100 (H) 05/13/2019 0830     Other studies Reviewed: Additional studies/ records that were reviewed today with results demonstrating: labs reviewed.   ASSESSMENT AND PLAN:  1. CAD: No angina.  DOE started with COVID infection in DEcember.  Sx are persistent.  Echo unremarkable.  Will check BMet and BNP.   Will call in her symbicort 2 puff 2x/day per her request.  SHe has been out for 10 days.  Will call in as a one time. LDL above target at last check.  On Zetia. INtolerant of statins. 2. Chronic diastolic heart failure: SHould take her furosemide.  3. Mild Aortic stenosis. ECho showed normal LV function.    Current medicines are reviewed at length  with the patient today.  The patient concerns regarding her medicines were addressed.  The following changes have been made:  No change  Labs/ tests ordered today include:  No orders of the defined types were placed in this encounter.   Recommend 150 minutes/week of aerobic exercise Low fat, low carb, high fiber diet recommended  Disposition:   FU in 6 months   Signed, Lance Muss, MD  06/30/2020 2:35 PM    [facility] [address] [place], [place], [place]  [address] Phone: [phone]; Fax: [phone]

## 2020-06-30 ENCOUNTER — Encounter: Payer: Self-pay | Admitting: Interventional Cardiology

## 2020-06-30 ENCOUNTER — Other Ambulatory Visit: Payer: Self-pay

## 2020-06-30 ENCOUNTER — Ambulatory Visit: Payer: PPO | Admitting: Interventional Cardiology

## 2020-06-30 VITALS — BP 140/100 | HR 82 | Ht 61.0 in | Wt 172.4 lb

## 2020-06-30 DIAGNOSIS — I10 Essential (primary) hypertension: Secondary | ICD-10-CM

## 2020-06-30 DIAGNOSIS — E785 Hyperlipidemia, unspecified: Secondary | ICD-10-CM

## 2020-06-30 DIAGNOSIS — I350 Nonrheumatic aortic (valve) stenosis: Secondary | ICD-10-CM

## 2020-06-30 DIAGNOSIS — R0600 Dyspnea, unspecified: Secondary | ICD-10-CM

## 2020-06-30 DIAGNOSIS — I5032 Chronic diastolic (congestive) heart failure: Secondary | ICD-10-CM

## 2020-06-30 DIAGNOSIS — I2510 Atherosclerotic heart disease of native coronary artery without angina pectoris: Secondary | ICD-10-CM | POA: Diagnosis not present

## 2020-06-30 DIAGNOSIS — I480 Paroxysmal atrial fibrillation: Secondary | ICD-10-CM | POA: Diagnosis not present

## 2020-06-30 DIAGNOSIS — R0609 Other forms of dyspnea: Secondary | ICD-10-CM

## 2020-06-30 MED ORDER — BUDESONIDE-FORMOTEROL FUMARATE 160-4.5 MCG/ACT IN AERO: 2.0000 | INHALATION_SPRAY | Freq: Two times a day (BID) | RESPIRATORY_TRACT | 0 refills | Status: DC | PRN

## 2020-06-30 NOTE — Patient Instructions (Addendum)
Medication Instructions:  Your physician has recommended you make the following change in your medication:  1.) Take symbicort 2 puffs twice a day as needed  2.) Furosemide 40mg  tablet take 1/2 a tablet for the next 3 days for swelling and shortness of breath and then start back taking as needed  *If you need a refill on your cardiac medications before your next appointment, please call your pharmacy*   Lab Work: TODAY: BMET and BNP  If you have labs (blood work) drawn today and your tests are completely normal, you will receive your results only by: MyChart Message (if you have MyChart) OR . A paper copy in the mail If you have any lab test that is abnormal or we need to change your treatment, we will call you to review the results.   Testing/Procedures: NONE   Follow-Up: At [facility], you and your health needs are our priority.  As part of our continuing mission to provide you with exceptional heart care, we have created designated Provider Care Teams.  These Care Teams include your primary Cardiologist (physician) and Advanced Practice Providers (APPs -  Physician Assistants and Nurse Practitioners) who all work together to provide you with the care you need, when you need it.  We recommend signing up for the patient portal called "MyChart".  Sign up information is provided on this After Visit Summary.  MyChart is used to connect with patients for Virtual Visits (Telemedicine).  Patients are able to view lab/test results, encounter notes, upcoming appointments, etc.  Non-urgent messages can be sent to your provider as well.   To learn more about what you can do with MyChart, go to [facility].    Your next appointment:   6 month(s)  The format for your next appointment:   In Person  Provider:   You may see [email or web address], MD or one of the following Advanced Practice Providers on your designated Care Team:    Lance Muss, PA-C  Ronie Spies,  PA-C

## 2020-07-01 ENCOUNTER — Telehealth: Payer: Self-pay | Admitting: *Deleted

## 2020-07-01 ENCOUNTER — Ambulatory Visit (INDEPENDENT_AMBULATORY_CARE_PROVIDER_SITE_OTHER): Payer: PPO | Admitting: Ophthalmology

## 2020-07-01 ENCOUNTER — Encounter (INDEPENDENT_AMBULATORY_CARE_PROVIDER_SITE_OTHER): Payer: Self-pay | Admitting: Ophthalmology

## 2020-07-01 DIAGNOSIS — H34811 Central retinal vein occlusion, right eye, with macular edema: Secondary | ICD-10-CM

## 2020-07-01 DIAGNOSIS — I5032 Chronic diastolic (congestive) heart failure: Secondary | ICD-10-CM

## 2020-07-01 DIAGNOSIS — H2512 Age-related nuclear cataract, left eye: Secondary | ICD-10-CM

## 2020-07-01 DIAGNOSIS — R0600 Dyspnea, unspecified: Secondary | ICD-10-CM

## 2020-07-01 DIAGNOSIS — R0609 Other forms of dyspnea: Secondary | ICD-10-CM

## 2020-07-01 LAB — BASIC METABOLIC PANEL
BUN/Creatinine Ratio: 16 (ref 12–28)
BUN: 16 mg/dL (ref 8–27)
CO2: 24 mmol/L (ref 20–29)
Calcium: 9.4 mg/dL (ref 8.7–10.3)
Chloride: 108 mmol/L — ABNORMAL HIGH (ref 96–106)
Creatinine, Ser: 0.97 mg/dL (ref 0.57–1.00)
GFR calc Af Amer: 63 mL/min/{1.73_m2} (ref >59)
GFR calc non Af Amer: 55 mL/min/{1.73_m2} — ABNORMAL LOW (ref >59)
Glucose: 91 mg/dL (ref 65–99)
Potassium: 4.3 mmol/L (ref 3.5–5.2)
Sodium: 144 mmol/L (ref 134–144)

## 2020-07-01 LAB — PRO B NATRIURETIC PEPTIDE: NT-Pro BNP: 1073 pg/mL — ABNORMAL HIGH (ref 0–738)

## 2020-07-01 MED ORDER — FUROSEMIDE 40 MG PO TABS: 40.0000 mg | ORAL_TABLET | Freq: Every day | ORAL | 3 refills | Status: DC

## 2020-07-01 MED ORDER — POTASSIUM CHLORIDE CRYS ER 20 MEQ PO TBCR: 20.0000 meq | EXTENDED_RELEASE_TABLET | Freq: Every day | ORAL | 3 refills | Status: DC

## 2020-07-01 MED ORDER — BEVACIZUMAB 2.5 MG/0.1ML IZ SOSY
2.5000 mg | PREFILLED_SYRINGE | INTRAVITREAL | Status: AC | PRN
  Administered 2020-07-01: 2.5 mg via INTRAVITREAL

## 2020-07-01 NOTE — Assessment & Plan Note (Signed)
Some impact on acuity from the cataract in each eye

## 2020-07-01 NOTE — Telephone Encounter (Signed)
I spoke with patient and confirmed previous lasix/potassium instructions with her

## 2020-07-01 NOTE — Telephone Encounter (Signed)
Patient notified.  Prescriptions sent to [facility] on [address].  Patient will come in for lab work on 07/08/20

## 2020-07-01 NOTE — Progress Notes (Signed)
07/01/2020     CHIEF COMPLAINT Patient presents for Retina Follow Up (5 WK FU OD, POSS AVASTIN OD///Pt reports pain and  pressure that started at last visit and just subsided yesterday in OD. Pt reports floaters have subsided, vision stable OD. )   HISTORY OF PRESENT ILLNESS: Nelma Hausch is a 85 y.o. female who presents to the clinic today for:   HPI    Retina Follow Up    Patient presents with  CRVO/BRVO.  In right eye.  This started 5 weeks ago.  Duration of 5 weeks. Additional comments: 5 WK FU OD, POSS AVASTIN OD   Pt reports pain and  pressure that started at last visit and just subsided yesterday in OD. Pt reports floaters have subsided, vision stable OD.        Last edited by Varney Biles D on 07/01/2020  8:50 AM. (History)      Referring physician: No referring provider defined for this encounter.  HISTORICAL INFORMATION:   Selected notes from the MEDICAL RECORD NUMBER       CURRENT MEDICATIONS: No current outpatient medications on file. (Ophthalmic Drugs)   No current facility-administered medications for this visit. (Ophthalmic Drugs)   Current Outpatient Medications (Other)  Medication Sig  . acetaminophen (TYLENOL) 325 MG tablet Per bottle as needed  . albuterol (PROVENTIL) (2.5 MG/3ML) 0.083% nebulizer solution Take 3 mLs (2.5 mg total) by nebulization every 6 (six) hours as needed for wheezing or shortness of breath.  . budesonide-formoterol (SYMBICORT) 160-4.5 MCG/ACT inhaler Inhale 2 puffs into the lungs 2 (two) times daily as needed.  . clopidogrel (PLAVIX) 75 MG tablet Take 1 tablet (75 mg total) by mouth daily.  Marland Kitchen ezetimibe (ZETIA) 10 MG tablet Take 10 mg by mouth daily.  . furosemide (LASIX) 40 MG tablet Take 0.5 tablet by mouth for 3 days, then take as needed for swelling  . isosorbide mononitrate (IMDUR) 30 MG 24 hr tablet Take 0.5 tablets (15 mg total) by mouth daily.  Marland Kitchen LINZESS 145 MCG CAPS capsule Take 145 mcg by mouth as needed.   . nitroGLYCERIN (NITROSTAT) 0.4 MG SL tablet Place 1 tablet (0.4 mg total) under the tongue every 5 (five) minutes as needed.  . pantoprazole (PROTONIX) 40 MG tablet Take 40 mg by mouth daily.  Marland Kitchen telmisartan (MICARDIS) 80 MG tablet TAKE 1 TABLET BY MOUTH EVERY DAY  . Vitamin D, Ergocalciferol, (DRISDOL) 1.25 MG (50000 UNIT) CAPS capsule Take 50,000 Units by mouth once a week.   No current facility-administered medications for this visit. (Other)      REVIEW OF SYSTEMS:    ALLERGIES Allergies  Allergen Reactions  . Aspirin Nausea Only  . Clarithromycin Other (See Comments)    unknown  . Doxycycline Nausea Only  . Pravastatin Itching    REACTION: itching  . Levaquin [Levofloxacin In D5w] Rash    Rash, HA  . Penicillins Itching and Rash    Other reaction(s): Unknown Has patient had a PCN reaction causing immediate rash, facial/tongue/throat swelling, SOB or lightheadedness with hypotension: No Has patient had a PCN reaction causing severe rash involving mucus membranes or skin necrosis: No Has patient had a PCN reaction that required hospitalization No Has patient had a PCN reaction occurring within the last 10 years: No If all of the above answers are "NO", then may proceed with Cephalosporin use.     PAST MEDICAL HISTORY Past Medical History:  Diagnosis Date  . Aortic stenosis    mild  by echo in 03/2020  . Arthritis   . CAD (coronary artery disease)    a. 02/2008 Cath: EF 60%, RCA 100 w/ L->R collats, LM 20d, RI 60-70, small, LAD 60/46m, 99d, D1 50, OM1 50. Poor distal targets for CABG/no good interventional target -> medical Rx; b. Myoview in [place] in 3/11 -nl;  c. 07/2011 MV: EF 67%, mild inf ischemia->Med Rx; 02/2015 MV: EF 50%, small, mod intensity apical and apical inf rev defect-->Med Rx.  . Chronic cough    hx; ACEI stopped, possible contribution from GERD  . Chronic diastolic CHF (congestive heart failure) (HCC)    Echocardiogram 10/21: EF 60-65, no RWMA, mild LVH, Gr  2 DD, normal RVSF, severe LAE, small pericardial effusion, trivial MR, mild AS (mean 11 mmHg)  . Depression   . Dyspnea    a. 08/2013 Echo: EF 60-65%, mild AS;  b. PFTs (10/10): FVC 103%, ratio 74%. mild obstruction; c. CT chest (6/11) no evidence for interstitial lung dz. possible asthma/upper airway cough sydrome. aspirin & beta blockers- potential causes for bronchospasm;  . GERD (gastroesophageal reflux disease)   . HBV (hepatitis B virus) infection   . HCV (hepatitis C virus)   . History of PAC's    hx  . History of PVC's    hx  . HLD (hyperlipidemia)    unable to tolerate most statins due to myalgias and elevated CK. thinks pravastatin casued a rash. Only tolerates crestor.  . Hypertensive heart disease   . IBS (irritable bowel syndrome)   . Obesity   . Osteoarthritis    knee; s/p TKR  . Osteoporosis   . Pancreatitis    hx  . Pericardial effusion    small by echo in 03/2020 // Limited Echocardiogram 1/22: EF 60-65, no RWMA, mod LVH, Gr 1 DD, normal RVSF, mild LAE, mild MR, mild AS mean 8.5 mmHg), no pericardial effusion     . PUD (peptic ulcer disease)   . Sleep apnea    diagnosed in 2011 , did not want CPAP -    Past Surgical History:  Procedure Laterality Date  . BREAST EXCISIONAL BIOPSY Right 1990  . calcification of right breast-lumps Right 1998  . CARDIAC CATHETERIZATION  2009  . CHOLECYSTECTOMY    . hyesterectomy, unspec. area  1998  . pancreatic duct stent    . TUBAL LIGATION  1995     FAMILY HISTORY Family History  Problem Relation Age of Onset  . Asthma Mother   . Stroke Mother        massive  . Arthritis/Rheumatoid Mother   . Liver disease Father   . Asthma Sister   . Stroke Sister   . COPD Sister   . Emphysema Sister   . Colon cancer Maternal Aunt   . Lung cancer Maternal Aunt   . Pancreatic cancer Brother     SOCIAL HISTORY Social History   Tobacco Use  . Smoking status: Never Smoker  . Smokeless tobacco: Never Used  . Tobacco comment: 2nd  hand exposure x40 years   Vaping Use  . Vaping Use: Never used  Substance Use Topics  . Alcohol use: No  . Drug use: No         OPHTHALMIC EXAM: Base Eye Exam    Visual Acuity (ETDRS)      Right Left   Dist [place] 20/70 +2 20/30 -2   Dist ph [place] 20/40 -2 NI       Tonometry (Tonopen, 8:59 AM)  Right Left   Pressure 27 22       Tonometry #2 (Tonopen, 8:59 AM)      Right Left   Pressure 25        Pupils      Pupils Dark Light Shape React APD   Right PERRL 3 2 Round Slow None   Left PERRL 3 3 Round Minimal None       Visual Fields (Counting fingers)      Left Right    Full Full       Extraocular Movement      Right Left    Full Full       Neuro/Psych    Oriented x3: Yes   Mood/Affect: Normal       Dilation    Right eye: 1.0% Mydriacyl, 2.5% Phenylephrine @ 8:59 AM        Slit Lamp and Fundus Exam    External Exam      Right Left   External Normal Normal       Slit Lamp Exam      Right Left   Lids/Lashes Normal Normal   Conjunctiva/Sclera White and quiet White and quiet   Cornea Clear Clear   Anterior Chamber Deep and quiet Deep and quiet   Iris Round and reactive Round and reactive   Lens 2.5+ Nuclear sclerosis 2.5+ Nuclear sclerosis   Anterior Vitreous Normal Normal       Fundus Exam      Right Left   Disc Normal    C/D Ratio 0.25    Macula Mild clinically significant macular edema           IMAGING AND PROCEDURES  Imaging and Procedures for 07/01/20  OCT, Retina - OU - Both Eyes       Right Eye Quality was good. Scan locations included subfoveal. Central Foveal Thickness: 228. Progression has improved.   Left Eye Quality was good. Scan locations included subfoveal. Central Foveal Thickness: 254. Progression has been stable.   Notes Much less CME, some residual noted temporal aspect of the macula post Avastin 5 weeks previous OD.  Central foveal atrophy may ultimately limit acuity OD       Intravitreal Injection,  Pharmacologic Agent - OD - Right Eye       Time Out 07/01/2020. 9:54 AM. Confirmed correct patient, procedure, site, and patient consented.   Anesthesia Topical anesthesia was used. Anesthetic medications included Akten 3.5%.   Procedure Preparation included Tobramycin 0.3%, 5% betadine to ocular surface. A 30 gauge needle was used.   Injection:  2.5 mg Bevacizumab (AVASTIN) 2.5mg /0.38mL SOSY   NDC: 38101-751-02, Lot: [id number]   Route: Intravitreal, Site: Right Eye  Post-op Post injection exam found visual acuity of at least counting fingers. The patient tolerated the procedure well. There were no complications. The patient received written and verbal post procedure care education. Post injection medications were not given.                 ASSESSMENT/PLAN:  Central retinal vein occlusion with macular edema of right eye CME from prior old complex central retinal vein occlusion much improved 1 month post Avastin with near resolution.  Recurrence however in the past requires delivery of repeat injection intravitreal Avastin today and perhaps extend the interval examination next to 6 weeks  Nuclear sclerotic cataract of left eye Some impact on acuity from the cataract in each eye      ICD-10-CM   1. Central retinal vein occlusion  with macular edema of right eye  H34.8110 OCT, Retina - OU - Both Eyes    Intravitreal Injection, Pharmacologic Agent - OD - Right Eye    bevacizumab (AVASTIN) SOSY 2.5 mg  2. Nuclear sclerotic cataract of left eye  H25.12     1.  Repeat intravitreal Avastin OD today to maintain resolution of CME and maximize visual potential.  2.  Had discussion with the patient regarding her floaters that initiated after last injection.  I explained that the fluid collection sometimes has air bubbles in the stent to dissolve and/or the fluid can also trigger the in terminal natural floaters to become more noticeable in the night.    3.  No signs of inflammation  exist  Ophthalmic Meds Ordered this visit:  Meds ordered this encounter  Medications  . bevacizumab (AVASTIN) SOSY 2.5 mg       Return in about 6 weeks (around 08/12/2020) for dilate, OD, AVASTIN OCT.  There are no Patient Instructions on file for this visit.   Explained the diagnoses, plan, and follow up with the patient and they expressed understanding.  Patient expressed understanding of the importance of proper follow up care.   Alford Highland Ameah Chanda M.D. Diseases & Surgery of the Retina and Vitreous [facility] 07/01/20     Abbreviations: M myopia (nearsighted); A astigmatism; H hyperopia (farsighted); P presbyopia; Mrx spectacle prescription;  CTL contact lenses; OD right eye; OS left eye; OU both eyes  XT exotropia; ET esotropia; PEK punctate epithelial keratitis; PEE punctate epithelial erosions; DES dry eye syndrome; MGD meibomian gland dysfunction; ATs artificial tears; PFAT's preservative free artificial tears; NSC nuclear sclerotic cataract; PSC posterior subcapsular cataract; ERM epi-retinal membrane; PVD posterior vitreous detachment; RD retinal detachment; DM diabetes mellitus; DR diabetic retinopathy; NPDR non-proliferative diabetic retinopathy; PDR proliferative diabetic retinopathy; CSME clinically significant macular edema; DME diabetic macular edema; dbh dot blot hemorrhages; CWS cotton wool spot; POAG primary open angle glaucoma; C/D cup-to-disc ratio; HVF humphrey visual field; GVF goldmann visual field; OCT optical coherence tomography; IOP intraocular pressure; BRVO Branch retinal vein occlusion; CRVO central retinal vein occlusion; CRAO central retinal artery occlusion; BRAO branch retinal artery occlusion; RT retinal tear; SB scleral buckle; PPV pars plana vitrectomy; VH Vitreous hemorrhage; PRP panretinal laser photocoagulation; IVK intravitreal kenalog; VMT vitreomacular traction; MH Macular hole;  NVD neovascularization of the disc; NVE  neovascularization elsewhere; AREDS age related eye disease study; ARMD age related macular degeneration; POAG primary open angle glaucoma; EBMD epithelial/anterior basement membrane dystrophy; ACIOL anterior chamber intraocular lens; IOL intraocular lens; PCIOL posterior chamber intraocular lens; Phaco/IOL phacoemulsification with intraocular lens placement; PRK photorefractive keratectomy; LASIK laser assisted in situ keratomileusis; HTN hypertension; DM diabetes mellitus; COPD chronic obstructive pulmonary disease

## 2020-07-01 NOTE — Telephone Encounter (Signed)
-----   Message from Corky Crafts, MD sent at 07/01/2020  2:34 PM EST ----- Evidence of extra fluid in her system.  Increase Lasix to 40 mg daily, potassium 20 mEq daily.  BMet in 1 week.

## 2020-07-01 NOTE — Assessment & Plan Note (Signed)
CME from prior old complex central retinal vein occlusion much improved 1 month post Avastin with near resolution.  Recurrence however in the past requires delivery of repeat injection intravitreal Avastin today and perhaps extend the interval examination next to 6 weeks

## 2020-07-01 NOTE — Telephone Encounter (Signed)
Patient returning call.

## 2020-07-08 ENCOUNTER — Other Ambulatory Visit: Payer: PPO | Admitting: *Deleted

## 2020-07-08 ENCOUNTER — Other Ambulatory Visit: Payer: Self-pay

## 2020-07-08 DIAGNOSIS — I5032 Chronic diastolic (congestive) heart failure: Secondary | ICD-10-CM | POA: Diagnosis not present

## 2020-07-08 DIAGNOSIS — R0609 Other forms of dyspnea: Secondary | ICD-10-CM

## 2020-07-08 DIAGNOSIS — R0600 Dyspnea, unspecified: Secondary | ICD-10-CM | POA: Diagnosis not present

## 2020-07-08 LAB — BASIC METABOLIC PANEL
BUN/Creatinine Ratio: 21 (ref 12–28)
BUN: 20 mg/dL (ref 8–27)
CO2: 24 mmol/L (ref 20–29)
Calcium: 9.6 mg/dL (ref 8.7–10.3)
Chloride: 110 mmol/L — ABNORMAL HIGH (ref 96–106)
Creatinine, Ser: 0.95 mg/dL (ref 0.57–1.00)
GFR calc Af Amer: 65 mL/min/{1.73_m2} (ref >59)
GFR calc non Af Amer: 56 mL/min/{1.73_m2} — ABNORMAL LOW (ref >59)
Glucose: 160 mg/dL — ABNORMAL HIGH (ref 65–99)
Potassium: 4.1 mmol/L (ref 3.5–5.2)
Sodium: 144 mmol/L (ref 134–144)

## 2020-07-29 DIAGNOSIS — M542 Cervicalgia: Secondary | ICD-10-CM | POA: Diagnosis not present

## 2020-07-29 DIAGNOSIS — M19011 Primary osteoarthritis, right shoulder: Secondary | ICD-10-CM | POA: Diagnosis not present

## 2020-07-29 DIAGNOSIS — M25512 Pain in left shoulder: Secondary | ICD-10-CM | POA: Diagnosis not present

## 2020-08-04 ENCOUNTER — Telehealth: Payer: Self-pay | Admitting: Interventional Cardiology

## 2020-08-04 DIAGNOSIS — Z79899 Other long term (current) drug therapy: Secondary | ICD-10-CM

## 2020-08-04 DIAGNOSIS — I5032 Chronic diastolic (congestive) heart failure: Secondary | ICD-10-CM

## 2020-08-04 NOTE — Telephone Encounter (Signed)
Pt c/o medication issue:  1. Name of Medication: furosemide (LASIX) 40 MG tablet and   potassium chloride SA (KLOR-CON) 20 MEQ tablet   2. How are you currently taking this medication (dosage and times per day)? As directed  3. Are you having a reaction (difficulty breathing--STAT)? yes  4. What is your medication issue? Patient states that these medications are causing her pain. She said she is having to go to the bathroom a lot to pee and that something just isnt right. She would like to speak with Dr. Eldridge Dace and his nurse.

## 2020-08-04 NOTE — Telephone Encounter (Signed)
Pt states since starting Furosemide and K+ she has noticed she is having joint and muscle pain all over.  Feels it in her legs, arms, back, shoulders.  Went to see Ortho because it got so bad and they gave her a shot on Friday.  By Sunday pain started to return.  Also has a slight HA which is new for her.  BP is always good.  Last check was 123/66.  Denies swelling.  Complains of increased urination.  Advised pt this is normal with Furosemide and due to her increased fluid level, this is why we had her take this medication.  Advised I will send to Dr. Eldridge Dace for review and advisement.  Pt appreciative for call.

## 2020-08-05 NOTE — Telephone Encounter (Signed)
Patient notified.  She will come in tomorrow for lab work. Patient complains of constipation. She also feels like she has a lot of gas at the top of her stomach between her breasts.  She states she cannot belch. Occurs every night. States she discussed with Dr Eldridge Dace at last office visit.  Shortness of breath is better. She is seeing Dr Kinnie Scales soon.

## 2020-08-05 NOTE — Telephone Encounter (Signed)
I agree.  Furosemide unlikely to cause these sx.  We can get a BMet to double check electrolytes.  WOuld do it whenever convenient for her.   JV

## 2020-08-06 ENCOUNTER — Other Ambulatory Visit: Payer: PPO | Admitting: *Deleted

## 2020-08-06 ENCOUNTER — Other Ambulatory Visit: Payer: Self-pay

## 2020-08-06 DIAGNOSIS — Z79899 Other long term (current) drug therapy: Secondary | ICD-10-CM

## 2020-08-06 DIAGNOSIS — I5032 Chronic diastolic (congestive) heart failure: Secondary | ICD-10-CM

## 2020-08-07 LAB — BASIC METABOLIC PANEL
BUN/Creatinine Ratio: 16 (ref 12–28)
BUN: 17 mg/dL (ref 8–27)
CO2: 20 mmol/L (ref 20–29)
Calcium: 9.2 mg/dL (ref 8.7–10.3)
Chloride: 110 mmol/L — ABNORMAL HIGH (ref 96–106)
Creatinine, Ser: 1.06 mg/dL — ABNORMAL HIGH (ref 0.57–1.00)
Glucose: 96 mg/dL (ref 65–99)
Potassium: 4.9 mmol/L (ref 3.5–5.2)
Sodium: 147 mmol/L — ABNORMAL HIGH (ref 134–144)
eGFR: 52 mL/min/{1.73_m2} — ABNORMAL LOW (ref >59)

## 2020-08-12 ENCOUNTER — Encounter (INDEPENDENT_AMBULATORY_CARE_PROVIDER_SITE_OTHER): Payer: PPO | Admitting: Ophthalmology

## 2020-09-15 DIAGNOSIS — M069 Rheumatoid arthritis, unspecified: Secondary | ICD-10-CM | POA: Diagnosis not present

## 2020-09-15 DIAGNOSIS — K219 Gastro-esophageal reflux disease without esophagitis: Secondary | ICD-10-CM | POA: Diagnosis not present

## 2020-09-15 DIAGNOSIS — R252 Cramp and spasm: Secondary | ICD-10-CM | POA: Diagnosis not present

## 2020-09-21 ENCOUNTER — Other Ambulatory Visit: Payer: Self-pay | Admitting: Interventional Cardiology

## 2020-09-28 DIAGNOSIS — I10 Essential (primary) hypertension: Secondary | ICD-10-CM | POA: Diagnosis not present

## 2020-09-28 DIAGNOSIS — R110 Nausea: Secondary | ICD-10-CM | POA: Diagnosis not present

## 2020-09-28 DIAGNOSIS — R0902 Hypoxemia: Secondary | ICD-10-CM | POA: Diagnosis not present

## 2020-09-28 DIAGNOSIS — R1084 Generalized abdominal pain: Secondary | ICD-10-CM | POA: Diagnosis not present

## 2020-09-28 DIAGNOSIS — R1111 Vomiting without nausea: Secondary | ICD-10-CM | POA: Diagnosis not present

## 2020-09-29 ENCOUNTER — Emergency Department (HOSPITAL_COMMUNITY): Payer: PPO

## 2020-09-29 ENCOUNTER — Other Ambulatory Visit: Payer: Self-pay

## 2020-09-29 ENCOUNTER — Emergency Department (HOSPITAL_COMMUNITY)
Admission: EM | Admit: 2020-09-29 | Discharge: 2020-09-29 | Disposition: A | Discharge status: Home / Self Care | Payer: PPO | Attending: Emergency Medicine | Admitting: Emergency Medicine

## 2020-09-29 ENCOUNTER — Encounter (HOSPITAL_COMMUNITY): Payer: Self-pay

## 2020-09-29 DIAGNOSIS — E876 Hypokalemia: Secondary | ICD-10-CM | POA: Diagnosis not present

## 2020-09-29 DIAGNOSIS — Z7902 Long term (current) use of antithrombotics/antiplatelets: Secondary | ICD-10-CM | POA: Diagnosis not present

## 2020-09-29 DIAGNOSIS — I5032 Chronic diastolic (congestive) heart failure: Secondary | ICD-10-CM | POA: Insufficient documentation

## 2020-09-29 DIAGNOSIS — R1110 Vomiting, unspecified: Secondary | ICD-10-CM | POA: Diagnosis not present

## 2020-09-29 DIAGNOSIS — R112 Nausea with vomiting, unspecified: Secondary | ICD-10-CM | POA: Insufficient documentation

## 2020-09-29 DIAGNOSIS — K219 Gastro-esophageal reflux disease without esophagitis: Secondary | ICD-10-CM | POA: Insufficient documentation

## 2020-09-29 DIAGNOSIS — R109 Unspecified abdominal pain: Secondary | ICD-10-CM | POA: Diagnosis not present

## 2020-09-29 DIAGNOSIS — I2510 Atherosclerotic heart disease of native coronary artery without angina pectoris: Secondary | ICD-10-CM | POA: Diagnosis not present

## 2020-09-29 DIAGNOSIS — I110 Hypertensive heart disease with heart failure: Secondary | ICD-10-CM | POA: Insufficient documentation

## 2020-09-29 DIAGNOSIS — Z20822 Contact with and (suspected) exposure to covid-19: Secondary | ICD-10-CM | POA: Diagnosis not present

## 2020-09-29 DIAGNOSIS — Z79899 Other long term (current) drug therapy: Secondary | ICD-10-CM | POA: Insufficient documentation

## 2020-09-29 LAB — CBC WITH DIFFERENTIAL/PLATELET
Abs Immature Granulocytes: 0.1 10*3/uL — ABNORMAL HIGH (ref 0.00–0.07)
Basophils Absolute: 0 10*3/uL (ref 0.0–0.1)
Basophils Relative: 0 %
Eosinophils Absolute: 0.1 10*3/uL (ref 0.0–0.5)
Eosinophils Relative: 1 %
HCT: 36.9 % (ref 36.0–46.0)
Hemoglobin: 11.8 g/dL — ABNORMAL LOW (ref 12.0–15.0)
Immature Granulocytes: 1 %
Lymphocytes Relative: 17 %
Lymphs Abs: 1.7 10*3/uL (ref 0.7–4.0)
MCH: 29.6 pg (ref 26.0–34.0)
MCHC: 32 g/dL (ref 30.0–36.0)
MCV: 92.7 fL (ref 80.0–100.0)
Monocytes Absolute: 0.6 10*3/uL (ref 0.1–1.0)
Monocytes Relative: 6 %
Neutro Abs: 7.6 10*3/uL (ref 1.7–7.7)
Neutrophils Relative %: 75 %
Platelets: 173 10*3/uL (ref 150–400)
RBC: 3.98 MIL/uL (ref 3.87–5.11)
RDW: 13.6 % (ref 11.5–15.5)
WBC: 10.1 10*3/uL (ref 4.0–10.5)
nRBC: 0 % (ref 0.0–0.2)

## 2020-09-29 LAB — COMPREHENSIVE METABOLIC PANEL
ALT: 14 U/L (ref 0–44)
AST: 16 U/L (ref 15–41)
Albumin: 3.9 g/dL (ref 3.5–5.0)
Alkaline Phosphatase: 65 U/L (ref 38–126)
Anion gap: 10 (ref 5–15)
BUN: 21 mg/dL (ref 8–23)
CO2: 21 mmol/L — ABNORMAL LOW (ref 22–32)
Calcium: 8.5 mg/dL — ABNORMAL LOW (ref 8.9–10.3)
Chloride: 108 mmol/L (ref 98–111)
Creatinine, Ser: 0.97 mg/dL (ref 0.44–1.00)
GFR, Estimated: 58 mL/min — ABNORMAL LOW (ref >60)
Glucose, Bld: 143 mg/dL — ABNORMAL HIGH (ref 70–99)
Potassium: 3.2 mmol/L — ABNORMAL LOW (ref 3.5–5.1)
Sodium: 139 mmol/L (ref 135–145)
Total Bilirubin: 0.4 mg/dL (ref 0.3–1.2)
Total Protein: 7 g/dL (ref 6.5–8.1)

## 2020-09-29 LAB — URINALYSIS, ROUTINE W REFLEX MICROSCOPIC
Bilirubin Urine: NEGATIVE
Glucose, UA: NEGATIVE mg/dL
Hgb urine dipstick: NEGATIVE
Ketones, ur: NEGATIVE mg/dL
Leukocytes,Ua: NEGATIVE
Nitrite: NEGATIVE
Protein, ur: NEGATIVE mg/dL
Specific Gravity, Urine: 1.011 (ref 1.005–1.030)
pH: 7 (ref 5.0–8.0)

## 2020-09-29 LAB — LIPASE, BLOOD: Lipase: 28 U/L (ref 11–51)

## 2020-09-29 LAB — SARS CORONAVIRUS 2 (TAT 6-24 HRS): SARS Coronavirus 2: NEGATIVE

## 2020-09-29 MED ORDER — SODIUM CHLORIDE 0.9 % IV BOLUS
1000.0000 mL | Freq: Once | INTRAVENOUS | Status: AC
  Administered 2020-09-29: 1000 mL via INTRAVENOUS

## 2020-09-29 MED ORDER — ONDANSETRON 4 MG PO TBDP: 4.0000 mg | ORAL_TABLET | Freq: Three times a day (TID) | ORAL | 0 refills | Status: DC | PRN

## 2020-09-29 MED ORDER — FENTANYL CITRATE (PF) 100 MCG/2ML IJ SOLN
50.0000 ug | Freq: Once | INTRAMUSCULAR | Status: AC
  Administered 2020-09-29: 50 ug via INTRAVENOUS
  Filled 2020-09-29: qty 2

## 2020-09-29 MED ORDER — METRONIDAZOLE 500 MG PO TABS: 500.0000 mg | ORAL_TABLET | Freq: Two times a day (BID) | ORAL | 0 refills | Status: DC

## 2020-09-29 MED ORDER — IOHEXOL 300 MG/ML  SOLN
100.0000 mL | Freq: Once | INTRAMUSCULAR | Status: AC | PRN
  Administered 2020-09-29: 100 mL via INTRAVENOUS

## 2020-09-29 MED ORDER — ONDANSETRON HCL 4 MG/2ML IJ SOLN
4.0000 mg | Freq: Once | INTRAMUSCULAR | Status: AC
  Administered 2020-09-29: 4 mg via INTRAVENOUS
  Filled 2020-09-29: qty 2

## 2020-09-29 NOTE — ED Notes (Signed)
Discharged no concerns at this time.  °

## 2020-09-29 NOTE — ED Triage Notes (Signed)
Patient arrived with complaints of abdominal pain, NV that started tonight at 9pm after eating pizza. History of GERD. Given 4mg  zofran and 200 cc NS prior to arrival.

## 2020-09-29 NOTE — ED Notes (Signed)
Patient given ginger ale for fluid challenge per request

## 2020-09-29 NOTE — ED Provider Notes (Signed)
[facility]-EMERGENCY DEPT Provider Note   CSN: [id number] Arrival date & time: 09/29/20  0011     History Chief Complaint  Patient presents with  . Abdominal Pain    [name] [name] is a 85 y.o. female.  Patient presents to the emergency department with a chief complaint of nausea and vomiting.  She states symptoms started tonight.  Reports last oral intake was pizza.  Denies any fevers.  Denies any diarrhea.  She denies any focal abdominal pain.  Denies any treatment prior to arrival.  Nothing makes the symptoms better or worse.  The history is provided by the patient. No language interpreter was used.       Past Medical History:  Diagnosis Date  . Aortic stenosis    mild by echo in 03/2020  . Arthritis   . CAD (coronary artery disease)    a. 02/2008 Cath: EF 60%, RCA 100 w/ L->R collats, LM 20d, RI 60-70, small, LAD 60/51m, 99d, D1 50, OM1 50. Poor distal targets for CABG/no good interventional target -> medical Rx; b. Myoview in [place] in 3/11 -nl;  c. 07/2011 MV: EF 67%, mild inf ischemia->Med Rx; 02/2015 MV: EF 50%, small, mod intensity apical and apical inf rev defect-->Med Rx.  . Chronic cough    hx; ACEI stopped, possible contribution from GERD  . Chronic diastolic CHF (congestive heart failure) (HCC)    Echocardiogram 10/21: EF 60-65, no RWMA, mild LVH, Gr 2 DD, normal RVSF, severe LAE, small pericardial effusion, trivial MR, mild AS (mean 11 mmHg)  . Depression   . Dyspnea    a. 08/2013 Echo: EF 60-65%, mild AS;  b. PFTs (10/10): FVC 103%, ratio 74%. mild obstruction; c. CT chest (6/11) no evidence for interstitial lung dz. possible asthma/upper airway cough sydrome. aspirin & beta blockers- potential causes for bronchospasm;  . GERD (gastroesophageal reflux disease)   . HBV (hepatitis B virus) infection   . HCV (hepatitis C virus)   . History of PAC's    hx  . History of PVC's    hx  . HLD (hyperlipidemia)    unable to tolerate most statins  due to myalgias and elevated CK. thinks pravastatin casued a rash. Only tolerates crestor.  . Hypertensive heart disease   . IBS (irritable bowel syndrome)   . Obesity   . Osteoarthritis    knee; s/p TKR  . Osteoporosis   . Pancreatitis    hx  . Pericardial effusion    small by echo in 03/2020 // Limited Echocardiogram 1/22: EF 60-65, no RWMA, mod LVH, Gr 1 DD, normal RVSF, mild LAE, mild MR, mild AS mean 8.5 mmHg), no pericardial effusion     . PUD (peptic ulcer disease)   . Sleep apnea    diagnosed in 2011 , did not want CPAP -     Patient Active Problem List   Diagnosis Date Noted  . Central retinal vein occlusion with macular edema of right eye 05/20/2020  . Retinal microaneurysm of right eye 05/20/2020  . Nuclear sclerotic cataract of right eye 05/20/2020  . Nuclear sclerotic cataract of left eye 05/20/2020  . Retinal hemorrhage of right eye 05/20/2020  . Statin myopathy 03/08/2019  . Mild aortic stenosis 07/13/2017  . Pneumonia due to organism 07/04/2016  . Hypertensive heart disease   . CAD (coronary artery disease)   . Hyperlipidemia LDL goal <70   . Morbid obesity due to excess calories (HCC)   . Dyspnea 12/23/2013  .  Sleep apnea   . PVC (premature ventricular contraction)   . Rheumatoid arthritis(714.0) 12/29/2011  . Shoulder pain 08/29/2011  . Cough variant asthma vs UACS with VCD  12/10/2009  . Upper airway cough syndrome 02/13/2009  . MURMUR 08/21/2008  . Coronary artery disease involving native coronary artery of native heart without angina pectoris 05/19/2008  . KNEE PAIN 05/26/2007  . HEPATITIS C 02/06/2007  . Hyperlipidemia 02/06/2007  . DEPRESSION 02/06/2007  . Essential hypertension 02/06/2007  . IRRITABLE BOWEL SYNDROME 02/06/2007  . OSTEOPOROSIS 02/06/2007  . PANCREATITIS, HX OF 02/06/2007  . ANXIETY 12/22/2006  . GERD 12/22/2006  . HEPATITIS B, HX OF 12/22/2006    Past Surgical History:  Procedure Laterality Date  . BREAST EXCISIONAL BIOPSY  Right 1990  . calcification of right breast-lumps Right 1998  . CARDIAC CATHETERIZATION  2009  . CHOLECYSTECTOMY    . hyesterectomy, unspec. area  1998  . pancreatic duct stent    . TUBAL LIGATION  1995      OB History   No obstetric history on file.     Family History  Problem Relation Age of Onset  . Asthma Mother   . Stroke Mother        massive  . Arthritis/Rheumatoid Mother   . Liver disease Father   . Asthma Sister   . Stroke Sister   . COPD Sister   . Emphysema Sister   . Colon cancer Maternal Aunt   . Lung cancer Maternal Aunt   . Pancreatic cancer Brother     Social History   Tobacco Use  . Smoking status: Never Smoker  . Smokeless tobacco: Never Used  . Tobacco comment: 2nd hand exposure x40 years   Vaping Use  . Vaping Use: Never used  Substance Use Topics  . Alcohol use: No  . Drug use: No    Home Medications Prior to Admission medications   Medication Sig Start Date End Date Taking? Authorizing Provider  metroNIDAZOLE (FLAGYL) 500 MG tablet Take 1 tablet (500 mg total) by mouth 2 (two) times daily. 09/29/20  Yes Roxy Horseman, PA-C  ondansetron (ZOFRAN ODT) 4 MG disintegrating tablet Take 1 tablet (4 mg total) by mouth every 8 (eight) hours as needed for nausea or vomiting. 09/29/20  Yes Roxy Horseman, PA-C  acetaminophen (TYLENOL) 325 MG tablet Per bottle as needed    [provider]  albuterol (PROVENTIL) (2.5 MG/3ML) 0.083% nebulizer solution Take 3 mLs (2.5 mg total) by nebulization every 6 (six) hours as needed for wheezing or shortness of breath. 05/31/16   Nyoka Cowden, MD  budesonide-formoterol (SYMBICORT) 160-4.5 MCG/ACT inhaler Inhale 2 puffs into the lungs 2 (two) times daily as needed. 06/30/20   Corky Crafts, MD  clopidogrel (PLAVIX) 75 MG tablet TAKE 1 TABLET BY MOUTH EVERY DAY 09/21/20   Corky Crafts, MD  ezetimibe (ZETIA) 10 MG tablet Take 10 mg by mouth daily.    [provider]  furosemide  (LASIX) 40 MG tablet Take 1 tablet (40 mg total) by mouth daily. 07/01/20   Corky Crafts, MD  isosorbide mononitrate (IMDUR) 30 MG 24 hr tablet Take 0.5 tablets (15 mg total) by mouth daily. 02/26/20   Tereso Newcomer T, PA-C  LINZESS 145 MCG CAPS capsule Take 145 mcg by mouth as needed. 02/09/19   [provider]  nitroGLYCERIN (NITROSTAT) 0.4 MG SL tablet Place 1 tablet (0.4 mg total) under the tongue every 5 (five) minutes as needed. 10/29/19   Eldridge Dace,  Donnie Coffin, MD  pantoprazole (PROTONIX) 40 MG tablet Take 40 mg by mouth daily. 02/21/20   [provider]  potassium chloride SA (KLOR-CON) 20 MEQ tablet Take 1 tablet (20 mEq total) by mouth daily. 07/01/20   Corky Crafts, MD  telmisartan (MICARDIS) 80 MG tablet TAKE 1 TABLET BY MOUTH EVERY DAY 01/20/20   Corky Crafts, MD  Vitamin D, Ergocalciferol, (DRISDOL) 1.25 MG (50000 UNIT) CAPS capsule Take 50,000 Units by mouth once a week. 05/25/20   [provider]    Allergies    Aspirin, Clarithromycin, Doxycycline, Pravastatin, Levaquin [levofloxacin in d5w], and Penicillins  Review of Systems   Review of Systems  All other systems reviewed and are negative.   Physical Exam Updated Vital Signs BP (!) 172/80   Pulse 62   Temp 97.6 F (36.4 C) (Oral)   Resp 12   SpO2 98%   Physical Exam Vitals and nursing note reviewed.  Constitutional:      General: She is not in acute distress.    Appearance: She is well-developed.  HENT:     Head: Normocephalic and atraumatic.  Eyes:     Conjunctiva/sclera: Conjunctivae normal.  Cardiovascular:     Rate and Rhythm: Normal rate and regular rhythm.     Heart sounds: No murmur heard.   Pulmonary:     Effort: Pulmonary effort is normal. No respiratory distress.     Breath sounds: Normal breath sounds.  Abdominal:     Palpations: Abdomen is soft.     Tenderness: There is no abdominal tenderness.     Comments: No focal abdominal tenderness, no RLQ  tenderness or pain at McBurney's point, no RUQ tenderness or Murphy's sign, no left-sided abdominal tenderness, no fluid wave, or signs of peritonitis   Musculoskeletal:        General: Normal range of motion.     Cervical back: Neck supple.  Skin:    General: Skin is warm and dry.  Neurological:     Mental Status: She is alert and oriented to person, place, and time.  Psychiatric:        Mood and Affect: Mood normal.        Behavior: Behavior normal.     ED Results / Procedures / Treatments   Labs (all labs ordered are listed, but only abnormal results are displayed) Labs Reviewed  CBC WITH DIFFERENTIAL/PLATELET - Abnormal; Notable for the following components:      Result Value   Hemoglobin 11.8 (*)    Abs Immature Granulocytes 0.10 (*)    All other components within normal limits  COMPREHENSIVE METABOLIC PANEL - Abnormal; Notable for the following components:   Potassium 3.2 (*)    CO2 21 (*)    Glucose, Bld 143 (*)    Calcium 8.5 (*)    GFR, Estimated 58 (*)    All other components within normal limits  URINALYSIS, ROUTINE W REFLEX MICROSCOPIC - Abnormal; Notable for the following components:   Color, Urine STRAW (*)    All other components within normal limits  SARS CORONAVIRUS 2 (TAT 6-24 HRS)  LIPASE, BLOOD    EKG None  Radiology CT ABDOMEN PELVIS W CONTRAST  Result Date: 09/29/2020 CLINICAL DATA:  Acute abdominal pain, nausea, and vomiting. EXAM: CT ABDOMEN AND PELVIS WITH CONTRAST TECHNIQUE: Multidetector CT imaging of the abdomen and pelvis was performed using the standard protocol following bolus administration of intravenous contrast. CONTRAST:  OMNIPAQUE IOHEXOL 300 MG/ML  SOLN COMPARISON:  08/22/2014 FINDINGS: Lower chest: Dependent atelectasis in the lung bases. Mild cardiac enlargement. Coronary artery calcifications. Moderate-sized esophageal hiatal hernia. Hepatobiliary: No focal liver abnormality is seen. Status post cholecystectomy. No biliary  dilatation. Pancreas: Unremarkable. No pancreatic ductal dilatation or surrounding inflammatory changes. Spleen: Normal in size without focal abnormality. Adrenals/Urinary Tract: Adrenal glands are unremarkable. Kidneys are normal, without renal calculi, focal lesion, or hydronephrosis. Bladder is unremarkable. Stomach/Bowel: Stomach, small bowel, and colon are not abnormally distended. Scattered stool through the colon. Diverticulosis of the sigmoid colon without evidence of diverticulitis. Most of the ascending, transverse, and descending colon is decompressed. Suggestion of wall thickening throughout the decompressed portion. This could be just due to decompression but colitis could also have this appearance. Vascular/Lymphatic: Aortic atherosclerosis. No enlarged abdominal or pelvic lymph nodes. Reproductive: Status post hysterectomy. No adnexal masses. Other: No free air or free fluid in the abdomen. Minimal periumbilical hernia containing fat. Musculoskeletal: Degenerative changes in the spine. Spondylolysis with mild spondylolisthesis at L4-5. IMPRESSION: 1. Moderate-sized esophageal hiatal hernia. 2. Diverticulosis of the sigmoid colon without evidence of diverticulitis. 3. Most of the ascending, transverse, and descending colon is decompressed. Suggestion of wall thickening throughout the decompressed portion. This could be just due to decompression but colitis could also have this appearance. 4. No evidence of bowel obstruction. Aortic Atherosclerosis (ICD10-I70.0). Electronically Signed   By: Burman Nieves M.D.   On: 09/29/2020 02:20    Procedures Procedures   Medications Ordered in ED Medications  sodium chloride 0.9 % bolus 1,000 mL (1,000 mLs Intravenous New Bag/Given 09/29/20 0050)  ondansetron (ZOFRAN) injection 4 mg (4 mg Intravenous Given 09/29/20 0051)  iohexol (OMNIPAQUE) 300 MG/ML solution 100 mL (100 mLs Intravenous Contrast Given 09/29/20 0151)  sodium chloride 0.9 % bolus 1,000 mL  (1,000 mLs Intravenous New Bag/Given 09/29/20 0248)  fentaNYL (SUBLIMAZE) injection 50 mcg (50 mcg Intravenous Given 09/29/20 0248)    ED Course  I have reviewed the triage vital signs and the nursing notes.  Pertinent labs & imaging results that were available during my care of the patient were reviewed by me and considered in my medical decision making (see chart for details).    MDM Rules/Calculators/A&P                          Patient here with nausea and vomiting.  Symptoms started suddenly tonight.  Abdomen is soft and nontender.  Vital signs are stable.  She is afebrile.  No leukocytosis.  Mild hypokalemia at 3.2.    CT scan shows moderate sized esophageal hiatal hernia, diverticulosis, but no evidence of diverticulitis.  It also shows findings consistent with colitis.  I think bacterial colitis is less likely, but will cover with flagyl.  Tolerating PO after zofran.  Feels much improved after fluids.  Patient seen by and discussed with Dr. Nicanor Alcon, who agrees with plan. Final Clinical Impression(s) / ED Diagnoses Final diagnoses:  Non-intractable vomiting with nausea, unspecified vomiting type    Rx / DC Orders ED Discharge Orders         Ordered    ondansetron (ZOFRAN ODT) 4 MG disintegrating tablet  Every 8 hours PRN        09/29/20 0415    metroNIDAZOLE (FLAGYL) 500 MG tablet  2 times daily        09/29/20 0415           Roxy Horseman, PA-C 09/29/20 0420    Palumbo, April, MD 09/29/20 0430

## 2020-10-13 ENCOUNTER — Other Ambulatory Visit: Payer: Self-pay | Admitting: Internal Medicine

## 2020-10-13 DIAGNOSIS — Z1231 Encounter for screening mammogram for malignant neoplasm of breast: Secondary | ICD-10-CM

## 2020-11-12 ENCOUNTER — Other Ambulatory Visit: Payer: Self-pay | Admitting: Interventional Cardiology

## 2020-11-26 ENCOUNTER — Other Ambulatory Visit: Payer: Self-pay | Admitting: Interventional Cardiology

## 2020-12-06 DIAGNOSIS — M545 Low back pain, unspecified: Secondary | ICD-10-CM | POA: Diagnosis not present

## 2020-12-07 ENCOUNTER — Other Ambulatory Visit: Payer: Self-pay | Admitting: Interventional Cardiology

## 2020-12-09 ENCOUNTER — Ambulatory Visit
Admission: RE | Admit: 2020-12-09 | Discharge: 2020-12-09 | Disposition: A | Discharge status: Home / Self Care | Payer: PPO | Source: Ambulatory Visit | Attending: Internal Medicine | Admitting: Internal Medicine

## 2020-12-09 ENCOUNTER — Other Ambulatory Visit: Payer: Self-pay

## 2020-12-09 DIAGNOSIS — Z1231 Encounter for screening mammogram for malignant neoplasm of breast: Secondary | ICD-10-CM | POA: Diagnosis not present

## 2020-12-14 ENCOUNTER — Other Ambulatory Visit: Payer: Self-pay | Admitting: Interventional Cardiology

## 2020-12-27 NOTE — Progress Notes (Signed)
Cardiology Office Note   Date:  12/28/2020   ID:  [name] [name], DOB Oct 15, 1935, MRN [id number]  PCP:  Jarome Matin (Inactive)    No chief complaint on file.  Chronic diastolic heart failure  Wt Readings from Last 3 Encounters:  12/28/20 170 lb 3.2 oz (77.2 kg)  06/30/20 172 lb 6.4 oz (78.2 kg)  04/03/20 175 lb 3.2 oz (79.5 kg)       History of Present Illness: [name] [name] is a 85 y.o. female    with history of multivessel CAD on cath in 2009 managed medically due to poor PCI and CABG targets.  Also has hypertension, HLD, OSA not on CPAP, PACs, PVCs, mild aortic stenosis.   She was seen by Dr. Okey Dupre in the past.  Myalgias have been an issue on statins.  She has been on and off statins.  It was noted in 10/19: " She was having myalgias and fatigue was concerned it was secondary to statins.  He agreed to 1 month off of statins if her symptoms improve refer for PCSK9 inhibitor therapy.  LDL was at 80 and not at goal.  If her symptoms do not improve with 1 month off he recommended increasing her rosuvastatin to 40 mg daily."   She did not start ezetemibe.     She tried bempedoic acid in 2020.  She felt that she could not walk well and had anxiety attacks with the new medicine.    In the past, it was noted: "She blames the medicines she takes for anxiety, nausea and fatigue.  She does not like taking 5-6 pills per day and feels this is a "whole lot of medicine."   Husband has dementia so she has a lot of stress at home.  Son lives near by but he has back issues. Stress is a trigger for some SHOB and palpitations.  She has tried SL NTG.   She reported some SHOB in 9/21.   She had COVID in 12/21 and had an antibody infusion.   BPs at home are in the 100/60 range.     Since COVID ( despite 2 vaccinations, no booster), she has had more DOE.     Plans to get booster in 2/22.  She has been doing well overall.  She has  a lot of stress with her husband having  dementia. Not walking for exercise because she is very active in the house taking care of her husband.  Using SL NTG on occasion with relief.  SHe uses inhalers as well.     Past Medical History:  Diagnosis Date   Aortic stenosis    mild by echo in 03/2020   Arthritis    CAD (coronary artery disease)    a. 02/2008 Cath: EF 60%, RCA 100 w/ L->R collats, LM 20d, RI 60-70, small, LAD 60/40m, 99d, D1 50, OM1 50. Poor distal targets for CABG/no good interventional target -> medical Rx; b. Myoview in [place] in 3/11 -nl;  c. 07/2011 MV: EF 67%, mild inf ischemia->Med Rx; 02/2015 MV: EF 50%, small, mod intensity apical and apical inf rev defect-->Med Rx.   Chronic cough    hx; ACEI stopped, possible contribution from GERD   Chronic diastolic CHF (congestive heart failure) (HCC)    Echocardiogram 10/21: EF 60-65, no RWMA, mild LVH, Gr 2 DD, normal RVSF, severe LAE, small pericardial effusion, trivial MR, mild AS (mean 11 mmHg)   Depression    Dyspnea    a.  08/2013 Echo: EF 60-65%, mild AS;  b. PFTs (10/10): FVC 103%, ratio 74%. mild obstruction; c. CT chest (6/11) no evidence for interstitial lung dz. possible asthma/upper airway cough sydrome. aspirin & beta blockers- potential causes for bronchospasm;   GERD (gastroesophageal reflux disease)    HBV (hepatitis B virus) infection    HCV (hepatitis C virus)    History of PAC's    hx   History of PVC's    hx   HLD (hyperlipidemia)    unable to tolerate most statins due to myalgias and elevated CK. thinks pravastatin casued a rash. Only tolerates crestor.   Hypertensive heart disease    IBS (irritable bowel syndrome)    Obesity    Osteoarthritis    knee; s/p TKR   Osteoporosis    Pancreatitis    hx   Pericardial effusion    small by echo in 03/2020 // Limited Echocardiogram 1/22: EF 60-65, no RWMA, mod LVH, Gr 1 DD, normal RVSF, mild LAE, mild MR, mild AS mean 8.5 mmHg), no pericardial effusion      PUD (peptic ulcer disease)    Sleep apnea     diagnosed in 2011 , did not want CPAP -     Past Surgical History:  Procedure Laterality Date   BREAST EXCISIONAL BIOPSY Right 1990   calcification of right breast-lumps Right 1998   CARDIAC CATHETERIZATION  2009   CHOLECYSTECTOMY     hyesterectomy, unspec. area  1998   pancreatic duct stent     TUBAL LIGATION  1995      Current Outpatient Medications  Medication Sig Dispense Refill   acetaminophen (TYLENOL) 325 MG tablet Per bottle as needed     albuterol (PROVENTIL) (2.5 MG/3ML) 0.083% nebulizer solution Take 3 mLs (2.5 mg total) by nebulization every 6 (six) hours as needed for wheezing or shortness of breath. 270 mL 0   budesonide-formoterol (SYMBICORT) 160-4.5 MCG/ACT inhaler Inhale 2 puffs into the lungs 2 (two) times daily as needed. 1 each 0   clopidogrel (PLAVIX) 75 MG tablet TAKE 1 TABLET BY MOUTH EVERY DAY 90 tablet 2   ezetimibe (ZETIA) 10 MG tablet Take 10 mg by mouth daily.     furosemide (LASIX) 40 MG tablet Take 1 tablet (40 mg total) by mouth daily. 90 tablet 3   isosorbide mononitrate (IMDUR) 30 MG 24 hr tablet Take 0.5 tablets (15 mg total) by mouth daily. 45 tablet 3   ketorolac (TORADOL) 30 MG/ML injection Inject 30 mg into the vein once.     LINZESS 145 MCG CAPS capsule Take 145 mcg by mouth as needed.     nitroGLYCERIN (NITROSTAT) 0.4 MG SL tablet PLACE 1 TABLET UNDER THE TONGUE EVERY 5 MINUTES AS NEEDED. 75 tablet 1   ondansetron (ZOFRAN ODT) 4 MG disintegrating tablet Take 1 tablet (4 mg total) by mouth every 8 (eight) hours as needed for nausea or vomiting. 10 tablet 0   pantoprazole (PROTONIX) 40 MG tablet Take 40 mg by mouth daily.     potassium chloride SA (KLOR-CON) 20 MEQ tablet Take 1 tablet (20 mEq total) by mouth daily. 90 tablet 3   telmisartan (MICARDIS) 80 MG tablet TAKE 1 TABLET BY MOUTH EVERY DAY 90 tablet 2   Vitamin D, Ergocalciferol, (DRISDOL) 1.25 MG (50000 UNIT) CAPS capsule Take 50,000 Units by mouth once a week.     ketorolac (TORADOL) 30  MG/ML injection Inject 30 mg into the muscle as needed.     metroNIDAZOLE (FLAGYL) 500  MG tablet Take 1 tablet (500 mg total) by mouth 2 (two) times daily. (Patient not taking: Reported on 12/28/2020) 14 tablet 0   No current facility-administered medications for this visit.    Allergies:   Aspirin, Clarithromycin, Doxycycline, Pravastatin, Levaquin [levofloxacin in d5w], and Penicillins    Social History:  The patient  reports that she has never smoked. She has never used smokeless tobacco. She reports that she does not drink alcohol and does not use drugs.   Family History:  The patient's family history includes Arthritis/Rheumatoid in her mother; Asthma in her mother and sister; COPD in her sister; Colon cancer in her maternal aunt; Emphysema in her sister; Liver disease in her father; Lung cancer in her maternal aunt; Pancreatic cancer in her brother; Stroke in her mother and sister.    ROS:  Please see the history of present illness.   Otherwise, review of systems are positive for stress, some chronic DOE.   All other systems are reviewed and negative.    PHYSICAL EXAM: VS:  BP 130/80   Pulse 75   Ht 5\' 1"  (1.549 m)   Wt 170 lb 3.2 oz (77.2 kg)   SpO2 99%   BMI 32.16 kg/m  , BMI Body mass index is 32.16 kg/m. GEN: Well nourished, well developed, in no acute distress HEENT: normal Neck: no JVD,  bilateral carotid bruits- likely aortic murmur radiating, or masses Cardiac: RRR; 2/6 systolic murmur, rubs, or gallops,no edema  Respiratory:  clear to auscultation bilaterally, normal work of breathing GI: soft, nontender, nondistended, + BS MS: no deformity or atrophy Skin: warm and dry, no rash Neuro:  Strength and sensation are intact Psych: euthymic mood, full affect    Recent Labs: 06/30/2020: NT-Pro BNP 1,073 09/29/2020: ALT 14; BUN 21; Creatinine, Ser 0.97; Hemoglobin 11.8; Platelets 173; Potassium 3.2; Sodium 139   Lipid Panel    Component Value Date/Time   CHOL 160  05/13/2019 0830   TRIG 62 05/13/2019 0830   HDL 48 05/13/2019 0830   CHOLHDL 3.3 05/13/2019 0830   CHOLHDL 2.0 06/16/2015 0800   VLDL 12 06/16/2015 0800   LDLCALC 100 (H) 05/13/2019 0830     Other studies Reviewed: Additional studies/ records that were reviewed today with results demonstrating: labs reviewed.   ASSESSMENT AND PLAN:  CAD: Angina controlled with medical therapy.  Try to keep activity level to the target below.  Husbands condition can limit her activity due to her need to be at home.  Chronic diastolic heart failure: Appears euvolemic. Stable.  Mild aortic stenosis: No syncope, dizziness. Mild murmur. Hyperlipidemia: intolerant of statin.  Tolerating Zetia.   Obesity: Whole food, plant based diet.  Increase fiber intake.  Elevated blood glucose:  Check A1C.    Current medicines are reviewed at length with the patient today.  The patient concerns regarding her medicines were addressed.  The following changes have been made:  No change  Labs/ tests ordered today include:   Orders Placed This Encounter  Procedures   Lipid Profile   HgB A1c    Recommend 150 minutes/week of aerobic exercise Low fat, low carb, high fiber diet recommended  Disposition:   FU in 6 months   Signed, 14/12/2018, MD  12/28/2020 11:05 AM    [facility] [address] [place], [place], [place]  [place] Phone: [phone]; Fax: [phone]

## 2020-12-28 ENCOUNTER — Ambulatory Visit: Payer: PPO | Admitting: Interventional Cardiology

## 2020-12-28 ENCOUNTER — Other Ambulatory Visit: Payer: Self-pay

## 2020-12-28 ENCOUNTER — Encounter: Payer: Self-pay | Admitting: Interventional Cardiology

## 2020-12-28 VITALS — BP 130/80 | HR 75 | Ht 61.0 in | Wt 170.2 lb

## 2020-12-28 DIAGNOSIS — E785 Hyperlipidemia, unspecified: Secondary | ICD-10-CM | POA: Diagnosis not present

## 2020-12-28 DIAGNOSIS — I5032 Chronic diastolic (congestive) heart failure: Secondary | ICD-10-CM | POA: Diagnosis not present

## 2020-12-28 DIAGNOSIS — I350 Nonrheumatic aortic (valve) stenosis: Secondary | ICD-10-CM

## 2020-12-28 DIAGNOSIS — I2510 Atherosclerotic heart disease of native coronary artery without angina pectoris: Secondary | ICD-10-CM | POA: Diagnosis not present

## 2020-12-28 DIAGNOSIS — E669 Obesity, unspecified: Secondary | ICD-10-CM | POA: Diagnosis not present

## 2020-12-28 NOTE — Patient Instructions (Signed)
Medication Instructions:  Your physician recommends that you continue on your current medications as directed. Please refer to the Current Medication list given to you today.  *If you need a refill on your cardiac medications before your next appointment, please call your pharmacy*   Lab Work: Your physician recommends that you return for lab work ion July 27,2022.  This will be fasting--Lipid and A1C.  The lab opens at 7:30 AM  If you have labs (blood work) drawn today and your tests are completely normal, you will receive your results only by: MyChart Message (if you have MyChart) OR A paper copy in the mail If you have any lab test that is abnormal or we need to change your treatment, we will call you to review the results.   Testing/Procedures: none   Follow-Up: At [facility], you and your health needs are our priority.  As part of our continuing mission to provide you with exceptional heart care, we have created designated Provider Care Teams.  These Care Teams include your primary Cardiologist (physician) and Advanced Practice Providers (APPs -  Physician Assistants and Nurse Practitioners) who all work together to provide you with the care you need, when you need it.  We recommend signing up for the patient portal called "MyChart".  Sign up information is provided on this After Visit Summary.  MyChart is used to connect with patients for Virtual Visits (Telemedicine).  Patients are able to view lab/test results, encounter notes, upcoming appointments, etc.  Non-urgent messages can be sent to your provider as well.   To learn more about what you can do with MyChart, go to [email or web address].    Your next appointment:   January 30,2023 at 10:00  The format for your next appointment:   In Person  Provider:   Dr Eldridge Dace   Other Instructions High-Fiber Eating Plan Fiber, also called dietary fiber, is a type of carbohydrate. It is found foods such as fruits,  vegetables, whole grains, and beans. A high-fiber diet can have many health benefits. Your health care provider may recommend a high-fiber diet to help: Prevent constipation. Fiber can make your bowel movements more regular. Lower your cholesterol. Relieve the following conditions: Inflammation of veins in the anus (hemorrhoids). Inflammation of specific areas of the digestive tract (uncomplicated diverticulosis). A problem of the large intestine, also called the colon, that sometimes causes pain and diarrhea (irritable bowel syndrome, or IBS). Prevent overeating as part of a weight-loss plan. Prevent heart disease, type 2 diabetes, and certain cancers. What are tips for following this plan? Reading food labels  Check the nutrition facts label on food products for the amount of dietary fiber. Choose foods that have 5 grams of fiber or more per serving. The goals for recommended daily fiber intake include: Men (age 110 or younger): 34-38 g. Men (over age 31): 28-34 g. Women (age 28 or younger): 25-28 g. Women (over age 65): 22-25 g. Your daily fiber goal is _____________ g. Shopping Choose whole fruits and vegetables instead of processed forms, such as apple juice or applesauce. Choose a wide variety of high-fiber foods such as avocados, lentils, oats, and kidney beans. Read the nutrition facts label of the foods you choose. Be aware of foods with added fiber. These foods often have high sugar and sodium amounts per serving. Cooking Use whole-grain flour for baking and cooking. Cook with brown rice instead of white rice. Meal planning Start the day with a breakfast that is high in fiber, such as  a cereal that contains 5 g of fiber or more per serving. Eat breads and cereals that are made with whole-grain flour instead of refined flour or white flour. Eat brown rice, bulgur wheat, or millet instead of white rice. Use beans in place of meat in soups, salads, and pasta dishes. Be sure that  half of the grains you eat each day are whole grains. General information You can get the recommended daily intake of dietary fiber by: Eating a variety of fruits, vegetables, grains, nuts, and beans. Taking a fiber supplement if you are not able to take in enough fiber in your diet. It is better to get fiber through food than from a supplement. Gradually increase how much fiber you consume. If you increase your intake of dietary fiber too quickly, you may have bloating, cramping, or gas. Drink plenty of water to help you digest fiber. Choose high-fiber snacks, such as berries, raw vegetables, nuts, and popcorn. What foods should I eat? Fruits Berries. Pears. Apples. Oranges. Avocado. Prunes and raisins. Dried figs. Vegetables Sweet potatoes. Spinach. Kale. Artichokes. Cabbage. Broccoli. Cauliflower.Green peas. Carrots. Squash. Grains Whole-grain breads. Multigrain cereal. Oats and oatmeal. Brown rice. Barley.Bulgur wheat. Millet. Quinoa. Bran muffins. Popcorn. Rye wafer crackers. Meats and other proteins Navy beans, kidney beans, and pinto beans. Soybeans. Split peas. Lentils. Nutsand seeds. Dairy Fiber-fortified yogurt. Beverages Fiber-fortified soy milk. Fiber-fortified orange juice. Other foods Fiber bars. The items listed above may not be a complete list of recommended foods and beverages. Contact a dietitian for more information. What foods should I avoid? Fruits Fruit juice. Cooked, strained fruit. Vegetables Fried potatoes. Canned vegetables. Well-cooked vegetables. Grains White bread. Pasta made with refined flour. White rice. Meats and other proteins Fatty cuts of meat. Fried chicken or fried fish. Dairy Milk. Yogurt. Cream cheese. Sour cream. Fats and oils Butters. Beverages Soft drinks. Other foods Cakes and pastries. The items listed above may not be a complete list of foods and beverages to avoid. Talk with your dietitian about what choices are best for  you. Summary Fiber is a type of carbohydrate. It is found in foods such as fruits, vegetables, whole grains, and beans. A high-fiber diet has many benefits. It can help to prevent constipation, lower blood cholesterol, aid weight loss, and reduce your risk of heart disease, diabetes, and certain cancers. Increase your intake of fiber gradually. Increasing fiber too quickly may cause cramping, bloating, and gas. Drink plenty of water while you increase the amount of fiber you consume. The best sources of fiber include whole fruits and vegetables, whole grains, nuts, seeds, and beans. This information is not intended to replace advice given to you by your health care provider. Make sure you discuss any questions you have with your healthcare provider. Document Revised: 09/26/2019 Document Reviewed: 09/26/2019 Elsevier Patient Education  2022 [employer].

## 2020-12-30 ENCOUNTER — Other Ambulatory Visit: Payer: PPO

## 2020-12-30 ENCOUNTER — Other Ambulatory Visit: Payer: Self-pay

## 2020-12-30 DIAGNOSIS — E785 Hyperlipidemia, unspecified: Secondary | ICD-10-CM | POA: Diagnosis not present

## 2020-12-30 DIAGNOSIS — I2510 Atherosclerotic heart disease of native coronary artery without angina pectoris: Secondary | ICD-10-CM | POA: Diagnosis not present

## 2020-12-30 DIAGNOSIS — E669 Obesity, unspecified: Secondary | ICD-10-CM

## 2020-12-31 LAB — HEMOGLOBIN A1C
Est. average glucose Bld gHb Est-mCnc: 126 mg/dL
Hgb A1c MFr Bld: 6 % — ABNORMAL HIGH (ref 4.8–5.6)

## 2020-12-31 LAB — LIPID PANEL
Chol/HDL Ratio: 3.1 ratio (ref 0.0–4.4)
Cholesterol, Total: 136 mg/dL (ref 100–199)
HDL: 44 mg/dL (ref >39)
LDL Chol Calc (NIH): 80 mg/dL (ref 0–99)
Triglycerides: 54 mg/dL (ref 0–149)
VLDL Cholesterol Cal: 12 mg/dL (ref 5–40)

## 2021-01-01 DIAGNOSIS — J449 Chronic obstructive pulmonary disease, unspecified: Secondary | ICD-10-CM | POA: Diagnosis not present

## 2021-01-01 DIAGNOSIS — G4733 Obstructive sleep apnea (adult) (pediatric): Secondary | ICD-10-CM | POA: Diagnosis not present

## 2021-01-01 DIAGNOSIS — M79631 Pain in right forearm: Secondary | ICD-10-CM | POA: Diagnosis not present

## 2021-01-01 DIAGNOSIS — I10 Essential (primary) hypertension: Secondary | ICD-10-CM | POA: Diagnosis not present

## 2021-01-01 DIAGNOSIS — I2510 Atherosclerotic heart disease of native coronary artery without angina pectoris: Secondary | ICD-10-CM | POA: Diagnosis not present

## 2021-01-01 DIAGNOSIS — M179 Osteoarthritis of knee, unspecified: Secondary | ICD-10-CM | POA: Diagnosis not present

## 2021-01-01 DIAGNOSIS — I350 Nonrheumatic aortic (valve) stenosis: Secondary | ICD-10-CM | POA: Diagnosis not present

## 2021-01-06 ENCOUNTER — Telehealth: Payer: Self-pay | Admitting: Interventional Cardiology

## 2021-01-06 NOTE — Telephone Encounter (Signed)
I spoke with patient.  She reports both feet are swollen.  Started over the weekend.  Also has some swelling around her left knee.  Left knee is painful with a stinging sensation. Saw PCP this past Friday and was started on lidocaine ointment for painful area on arm due to history of shingles. Has shortness of breath off and on. Does not occur daily and has not worsened recently.   She has not taken furosemide or potassium for awhile.  I checked with [facility] and they have these prescriptions ready for patient.  I advised patient to resume furosemide and potassium and that prescriptions were ready for pick up.  She will let us know if swelling in feet does not improve.

## 2021-01-06 NOTE — Telephone Encounter (Signed)
Pt c/o swelling: STAT is pt has developed SOB within 24 hours  If swelling, where is the swelling located? Feet and Left leg up to her knee. Her R foot is swollen but not her R leg   How much weight have you gained and in what time span?   Have you gained 3 pounds in a day or 5 pounds in a week?   Do you have a log of your daily weights (if so, list)? No. Patient does not weigh herself regularly.   Are you currently taking a fluid pill? She hasn't been taking one "for a while". She has to wait until Friday for her pharmacy to fill a new rx for her   Are you currently SOB? no  Have you traveled recently? No  Patient said she put lidocaine on her legs over the weekend. She still had some pain remaining from when she had shingles two years ago. She is not sure where all the swelling is coming from

## 2021-01-25 DIAGNOSIS — E559 Vitamin D deficiency, unspecified: Secondary | ICD-10-CM | POA: Diagnosis not present

## 2021-01-25 DIAGNOSIS — E785 Hyperlipidemia, unspecified: Secondary | ICD-10-CM | POA: Diagnosis not present

## 2021-02-02 DIAGNOSIS — Z1339 Encounter for screening examination for other mental health and behavioral disorders: Secondary | ICD-10-CM | POA: Diagnosis not present

## 2021-02-02 DIAGNOSIS — I2510 Atherosclerotic heart disease of native coronary artery without angina pectoris: Secondary | ICD-10-CM | POA: Diagnosis not present

## 2021-02-02 DIAGNOSIS — M5117 Intervertebral disc disorders with radiculopathy, lumbosacral region: Secondary | ICD-10-CM | POA: Diagnosis not present

## 2021-02-02 DIAGNOSIS — J449 Chronic obstructive pulmonary disease, unspecified: Secondary | ICD-10-CM | POA: Diagnosis not present

## 2021-02-02 DIAGNOSIS — M1711 Unilateral primary osteoarthritis, right knee: Secondary | ICD-10-CM | POA: Diagnosis not present

## 2021-02-02 DIAGNOSIS — I10 Essential (primary) hypertension: Secondary | ICD-10-CM | POA: Diagnosis not present

## 2021-02-02 DIAGNOSIS — G4733 Obstructive sleep apnea (adult) (pediatric): Secondary | ICD-10-CM | POA: Diagnosis not present

## 2021-02-02 DIAGNOSIS — R2230 Localized swelling, mass and lump, unspecified upper limb: Secondary | ICD-10-CM | POA: Diagnosis not present

## 2021-02-02 DIAGNOSIS — I700 Atherosclerosis of aorta: Secondary | ICD-10-CM | POA: Diagnosis not present

## 2021-02-02 DIAGNOSIS — Z0000 Encounter for general adult medical examination without abnormal findings: Secondary | ICD-10-CM | POA: Diagnosis not present

## 2021-02-02 DIAGNOSIS — Z1331 Encounter for screening for depression: Secondary | ICD-10-CM | POA: Diagnosis not present

## 2021-02-27 ENCOUNTER — Other Ambulatory Visit: Payer: Self-pay | Admitting: Interventional Cardiology

## 2021-03-10 DIAGNOSIS — M545 Low back pain, unspecified: Secondary | ICD-10-CM | POA: Diagnosis not present

## 2021-03-15 ENCOUNTER — Telehealth: Payer: Self-pay | Admitting: Interventional Cardiology

## 2021-03-15 NOTE — Telephone Encounter (Signed)
Pt c/o Shortness Of Breath: STAT if SOB developed within the last 24 hours or pt is noticeably SOB on the phone  1. Are you currently SOB (can you hear that pt is SOB on the phone)? no  2. How long have you been experiencing SOB? Off and on for 2-3 months  3. Are you SOB when sitting or when up moving around? Moving around  4. Are you currently experiencing any other symptoms? exhausted    Patient states she has been getting SOB for the last 2-3 months. She says she gets a fullness feeling when she is trying to breathe while walking. She states she also gets very exhausted, but has no other symptoms and was not audibly SOB on the phone. She states it is "really scaring" her.

## 2021-03-15 NOTE — Telephone Encounter (Signed)
I spoke with patient. She reports she will fall asleep without problems but then will wake up about 2 hours later with shortness of breath. Has chest tightness with this. Occurs every other day or so for the last 2-3 months.  Will also have shortness of breath when walking around the house.  Denies any swelling.  Does not weigh daily but will start doing this.  She has not been taking lasix and potassium daily.  Takes about 2 days per week.  I explained the importance of taking this every day as ordered.  Patient will start taking furosemide/potassium daily and let us know if shortness of breath does not improve

## 2021-04-09 DIAGNOSIS — I10 Essential (primary) hypertension: Secondary | ICD-10-CM | POA: Diagnosis not present

## 2021-04-09 DIAGNOSIS — J449 Chronic obstructive pulmonary disease, unspecified: Secondary | ICD-10-CM | POA: Diagnosis not present

## 2021-04-09 DIAGNOSIS — M5117 Intervertebral disc disorders with radiculopathy, lumbosacral region: Secondary | ICD-10-CM | POA: Diagnosis not present

## 2021-04-09 DIAGNOSIS — I2510 Atherosclerotic heart disease of native coronary artery without angina pectoris: Secondary | ICD-10-CM | POA: Diagnosis not present

## 2021-04-21 DIAGNOSIS — I119 Hypertensive heart disease without heart failure: Secondary | ICD-10-CM | POA: Diagnosis not present

## 2021-04-21 DIAGNOSIS — I25118 Atherosclerotic heart disease of native coronary artery with other forms of angina pectoris: Secondary | ICD-10-CM | POA: Diagnosis not present

## 2021-04-25 ENCOUNTER — Other Ambulatory Visit: Payer: Self-pay | Admitting: Interventional Cardiology

## 2021-05-05 DIAGNOSIS — I10 Essential (primary) hypertension: Secondary | ICD-10-CM | POA: Diagnosis not present

## 2021-05-05 DIAGNOSIS — E785 Hyperlipidemia, unspecified: Secondary | ICD-10-CM | POA: Diagnosis not present

## 2021-05-05 DIAGNOSIS — J449 Chronic obstructive pulmonary disease, unspecified: Secondary | ICD-10-CM | POA: Diagnosis not present

## 2021-05-05 DIAGNOSIS — I2510 Atherosclerotic heart disease of native coronary artery without angina pectoris: Secondary | ICD-10-CM | POA: Diagnosis not present

## 2021-05-28 DIAGNOSIS — M25551 Pain in right hip: Secondary | ICD-10-CM | POA: Diagnosis not present

## 2021-05-28 DIAGNOSIS — M549 Dorsalgia, unspecified: Secondary | ICD-10-CM | POA: Diagnosis not present

## 2021-06-02 DIAGNOSIS — I10 Essential (primary) hypertension: Secondary | ICD-10-CM | POA: Diagnosis not present

## 2021-06-02 DIAGNOSIS — K7460 Unspecified cirrhosis of liver: Secondary | ICD-10-CM | POA: Diagnosis not present

## 2021-06-02 DIAGNOSIS — J00 Acute nasopharyngitis [common cold]: Secondary | ICD-10-CM | POA: Diagnosis not present

## 2021-06-02 DIAGNOSIS — M549 Dorsalgia, unspecified: Secondary | ICD-10-CM | POA: Diagnosis not present

## 2021-06-04 DIAGNOSIS — I10 Essential (primary) hypertension: Secondary | ICD-10-CM | POA: Diagnosis not present

## 2021-06-04 DIAGNOSIS — J449 Chronic obstructive pulmonary disease, unspecified: Secondary | ICD-10-CM | POA: Diagnosis not present

## 2021-06-04 DIAGNOSIS — E785 Hyperlipidemia, unspecified: Secondary | ICD-10-CM | POA: Diagnosis not present

## 2021-06-04 DIAGNOSIS — I2510 Atherosclerotic heart disease of native coronary artery without angina pectoris: Secondary | ICD-10-CM | POA: Diagnosis not present

## 2021-07-01 DIAGNOSIS — B1920 Unspecified viral hepatitis C without hepatic coma: Secondary | ICD-10-CM | POA: Diagnosis not present

## 2021-07-01 DIAGNOSIS — R109 Unspecified abdominal pain: Secondary | ICD-10-CM | POA: Diagnosis not present

## 2021-07-01 DIAGNOSIS — U071 COVID-19: Secondary | ICD-10-CM | POA: Diagnosis not present

## 2021-07-01 DIAGNOSIS — K5900 Constipation, unspecified: Secondary | ICD-10-CM | POA: Diagnosis not present

## 2021-07-01 DIAGNOSIS — K219 Gastro-esophageal reflux disease without esophagitis: Secondary | ICD-10-CM | POA: Diagnosis not present

## 2021-07-01 DIAGNOSIS — M5117 Intervertebral disc disorders with radiculopathy, lumbosacral region: Secondary | ICD-10-CM | POA: Diagnosis not present

## 2021-07-04 NOTE — Progress Notes (Signed)
Cardiology Office Note   Date:  07/05/2021   ID:  [name] [name], DOB 14-Dec-1935, MRN [id number]  PCP:  [name] [name] (Inactive)    Chief Complaint  Patient presents with   Follow-up    Having some shortness of breath, elevated blood pressure and headaches   CAD  Wt Readings from Last 3 Encounters:  07/05/21 172 lb (78 kg)  12/28/20 170 lb 3.2 oz (77.2 kg)  06/30/20 172 lb 6.4 oz (78.2 kg)       History of Present Illness: [name] [name] is a 86 y.o. female   with history of multivessel CAD on cath in 2009 managed medically due to poor PCI and CABG targets.  Also has hypertension, HLD, OSA not on CPAP, PACs, PVCs, mild aortic stenosis.   She was seen by Dr. Saunders [name] in the past.  Myalgias have been an issue on statins.  She has been on and off statins.  It was noted in 10/19: " She was having myalgias and fatigue was concerned it was secondary to statins.  He agreed to 1 month off of statins if her symptoms improve refer for PCSK9 inhibitor therapy.  LDL was at 80 and not at goal.  If her symptoms do not improve with 1 month off he recommended increasing her rosuvastatin to 40 mg daily."   She did not start ezetemibe.     She tried bempedoic acid in 2020.  She felt that she could not walk well and had anxiety attacks with the new medicine.    In the past, it was noted: "She blames the medicines she takes for anxiety, nausea and fatigue.  She does not like taking 5-6 pills per day and feels this is a "whole lot of medicine."   She reported some SHOB in 9/21.   She had COVID in 12/21 and had an antibody infusion.  Husband has dementia so she has a lot of stress at home.  Son lives near by but he has back issues. Stress is a trigger for some SHOB and palpitations.  She has tried SL NTG.  She has difficulty resting when he gets confused at night.   Denies :  Dizziness. Leg edema. Nitroglycerin use. Orthopnea. Palpitations. Paroxysmal nocturnal dyspnea.  Syncope.     Headaches daily.  Can last for hours.  Worse with stress.   Past Medical History:  Diagnosis Date   Aortic stenosis    mild by echo in 03/2020   Arthritis    CAD (coronary artery disease)    a. 02/2008 Cath: EF 60%, RCA 100 w/ L->R collats, LM 20d, RI 60-70, small, LAD 60/8m, 99d, D1 50, OM1 50. Poor distal targets for CABG/no good interventional target -> medical Rx; b. Myoview in [place] in 3/11 -nl;  c. 07/2011 MV: EF 67%, mild inf ischemia->Med Rx; 02/2015 MV: EF 50%, small, mod intensity apical and apical inf rev defect-->Med Rx.   Chronic cough    hx; ACEI stopped, possible contribution from GERD   Chronic diastolic CHF (congestive heart failure) (HCC)    Echocardiogram 10/21: EF 60-65, no RWMA, mild LVH, Gr 2 DD, normal RVSF, severe LAE, small pericardial effusion, trivial MR, mild AS (mean 11 mmHg)   Depression    Dyspnea    a. 08/2013 Echo: EF 60-65%, mild AS;  b. PFTs (10/10): FVC 103%, ratio 74%. mild obstruction; c. CT chest (6/11) no evidence for interstitial lung dz. possible asthma/upper airway cough sydrome. aspirin & beta blockers- potential causes  for bronchospasm;   GERD (gastroesophageal reflux disease)    HBV (hepatitis B virus) infection    HCV (hepatitis C virus)    History of PAC's    hx   History of PVC's    hx   HLD (hyperlipidemia)    unable to tolerate most statins due to myalgias and elevated CK. thinks pravastatin casued a rash. Only tolerates crestor.   Hypertensive heart disease    IBS (irritable bowel syndrome)    Obesity    Osteoarthritis    knee; s/p TKR   Osteoporosis    Pancreatitis    hx   Pericardial effusion    small by echo in 03/2020 // Limited Echocardiogram 1/22: EF 60-65, no RWMA, mod LVH, Gr 1 DD, normal RVSF, mild LAE, mild MR, mild AS mean 8.5 mmHg), no pericardial effusion      PUD (peptic ulcer disease)    Sleep apnea    diagnosed in 2011 , did not want CPAP -     Past Surgical History:  Procedure Laterality Date   BREAST  EXCISIONAL BIOPSY Right 1990   calcification of right breast-lumps Right 1998   CARDIAC CATHETERIZATION  2009   CHOLECYSTECTOMY     hyesterectomy, unspec. area  1998   pancreatic duct stent     TUBAL LIGATION  1995      Current Outpatient Medications  Medication Sig Dispense Refill   acetaminophen (TYLENOL) 325 MG tablet Per bottle as needed     albuterol (PROVENTIL) (2.5 MG/3ML) 0.083% nebulizer solution Take 3 mLs (2.5 mg total) by nebulization every 6 (six) hours as needed for wheezing or shortness of breath. 270 mL 0   budesonide-formoterol (SYMBICORT) 160-4.5 MCG/ACT inhaler Inhale 2 puffs into the lungs 2 (two) times daily as needed. 1 each 0   clopidogrel (PLAVIX) 75 MG tablet TAKE 1 TABLET BY MOUTH EVERY DAY 90 tablet 2   ezetimibe (ZETIA) 10 MG tablet Take 10 mg by mouth daily.     furosemide (LASIX) 40 MG tablet Take 1 tablet (40 mg total) by mouth daily. 90 tablet 3   ketorolac (TORADOL) 30 MG/ML injection Inject 30 mg into the vein once.     LINZESS 145 MCG CAPS capsule Take 145 mcg by mouth as needed.     nitroGLYCERIN (NITROSTAT) 0.4 MG SL tablet PLACE 1 TABLET UNDER THE TONGUE EVERY 5 MINUTES AS NEEDED. 25 tablet 3   pantoprazole (PROTONIX) 40 MG tablet Take 40 mg by mouth daily.     potassium chloride SA (KLOR-CON) 20 MEQ tablet Take 1 tablet (20 mEq total) by mouth daily. 90 tablet 3   telmisartan (MICARDIS) 80 MG tablet TAKE 1 TABLET BY MOUTH EVERY DAY 90 tablet 2   Vitamin D, Ergocalciferol, (DRISDOL) 1.25 MG (50000 UNIT) CAPS capsule Take 50,000 Units by mouth once a week.     No current facility-administered medications for this visit.    Allergies:   Aspirin, Clarithromycin, Doxycycline, Levaquin [levofloxacin in d5w], Penicillins, and Pravastatin    Social History:  The patient  reports that she has never smoked. She has never used smokeless tobacco. She reports that she does not drink alcohol and does not use drugs.   Family History:  The patient's family  history includes Arthritis/Rheumatoid in her mother; Asthma in her mother and sister; COPD in her sister; Colon cancer in her maternal aunt; Emphysema in her sister; Liver disease in her father; Lung cancer in her maternal aunt; Pancreatic cancer in her brother; Stroke in  her mother and sister.    ROS:  Please see the history of present illness.   Otherwise, review of systems are positive for stress.   All other systems are reviewed and negative.    PHYSICAL EXAM: VS:  BP (!) 152/84    Pulse 67    Ht 5' (1.524 m)    Wt 172 lb (78 kg)    SpO2 97%    BMI 33.59 kg/m  , BMI Body mass index is 33.59 kg/m. GEN: Well nourished, well developed, in no acute distress HEENT: normal Neck: no JVD, carotid bruits, or masses Cardiac: RRR; 2/6 systolic murmurs, no rubs, or gallops,no LEedema  Respiratory:  clear to auscultation bilaterally, normal work of breathing GI: soft, nontender, nondistended, + BS MS: no deformity or atrophy Skin: warm and dry, no rash Neuro:  Strength and sensation are intact Psych: euthymic mood, full affect   EKG:   The ekg ordered today demonstrates NSR, PAC, no ST changes   Recent Labs: 09/29/2020: ALT 14; BUN 21; Creatinine, Ser 0.97; Hemoglobin 11.8; Platelets 173; Potassium 3.2; Sodium 139   Lipid Panel    Component Value Date/Time   CHOL 136 12/30/2020 0756   TRIG 54 12/30/2020 0756   HDL 44 12/30/2020 0756   CHOLHDL 3.1 12/30/2020 0756   CHOLHDL 2.0 06/16/2015 0800   VLDL 12 06/16/2015 0800   LDLCALC 80 12/30/2020 0756     Other studies Reviewed: Additional studies/ records that were reviewed today with results demonstrating: Labs reviewed, LDL 100 in August 2022, A1c 6.0 in July 2022.   ASSESSMENT AND PLAN:  CAD: Refill nitroglycerin.  No clear angina.  Stress has been the biggest trigger.  Hypertension noted as well while on her ARB.  Blood pressure elevated today.  It has been high at home as well.  We will add amlodipine 5 mg daily.  No documented  allergy noted.  This should also help with any kind of chest pain.  Better blood pressure control may also help with her headaches. Chronic diastolic heart failure: She appears euvolemic.  Low-salt diet.  Avoid processed foods. Mild aortic stenosis: Mild murmur.  No symptoms of significant aortic stenosis. Hyperlipidemia: She was intolerant pravastatin.  She has been on rosuvastatin in the past at higher doses.  For general risk reduction, would start rosuvastatin 10 mg daily.  Healthy diet recommended as well.  She has had a habit of starting and stopping medications as she pleases, based on if she feels like she is taking too much medicine. Obesity: She would benefit from weight loss. Elevated blood glucose: High-fiber diet.  Increase walking to the target below.  Her husband needing constant care limits her ability to walk. She is clearly under a lot of stress with her husband's disability.   Current medicines are reviewed at length with the patient today.  The patient concerns regarding her medicines were addressed.  The following changes have been made: As above  Labs/ tests ordered today include: Liver, lipids in 3 months No orders of the defined types were placed in this encounter.   Recommend 150 minutes/week of aerobic exercise Low fat, low carb, high fiber diet recommended  Disposition:   FU in 6 months   Signed, Larae Grooms, MD  07/05/2021 10:42 AM    [facility] [place], [place], [place]  [address] Phone: [phone]; Fax: [phone]

## 2021-07-05 ENCOUNTER — Other Ambulatory Visit: Payer: Self-pay

## 2021-07-05 ENCOUNTER — Ambulatory Visit: Payer: PPO | Admitting: Interventional Cardiology

## 2021-07-05 ENCOUNTER — Encounter: Payer: Self-pay | Admitting: Interventional Cardiology

## 2021-07-05 VITALS — BP 152/84 | HR 67 | Ht 60.0 in | Wt 172.0 lb

## 2021-07-05 DIAGNOSIS — E669 Obesity, unspecified: Secondary | ICD-10-CM

## 2021-07-05 DIAGNOSIS — I350 Nonrheumatic aortic (valve) stenosis: Secondary | ICD-10-CM

## 2021-07-05 DIAGNOSIS — I25118 Atherosclerotic heart disease of native coronary artery with other forms of angina pectoris: Secondary | ICD-10-CM | POA: Diagnosis not present

## 2021-07-05 DIAGNOSIS — I5032 Chronic diastolic (congestive) heart failure: Secondary | ICD-10-CM

## 2021-07-05 DIAGNOSIS — E785 Hyperlipidemia, unspecified: Secondary | ICD-10-CM

## 2021-07-05 MED ORDER — AMLODIPINE BESYLATE 5 MG PO TABS: 5.0000 mg | ORAL_TABLET | Freq: Every day | ORAL | 3 refills | Status: DC

## 2021-07-05 MED ORDER — NITROGLYCERIN 0.4 MG SL SUBL: SUBLINGUAL_TABLET | SUBLINGUAL | 6 refills | Status: DC

## 2021-07-05 MED ORDER — ROSUVASTATIN CALCIUM 10 MG PO TABS: 10.0000 mg | ORAL_TABLET | Freq: Every day | ORAL | 3 refills | Status: DC

## 2021-07-05 NOTE — Patient Instructions (Signed)
Medication Instructions:  Your physician has recommended you make the following change in your medication: Start Amlodipine 5 mg by mouth daily Start Rosuvastatin 10 mg by mouth daily  *If you need a refill on your cardiac medications before your next appointment, please call your pharmacy*   Lab Work: Your physician recommends that you return for lab work on September 29, 2021.  CMET and Lipids.  This will be fasting.  The lab opens at 7:30 AM  If you have labs (blood work) drawn today and your tests are completely normal, you will receive your results only by: MyChart Message (if you have MyChart) OR A paper copy in the mail If you have any lab test that is abnormal or we need to change your treatment, we will call you to review the results.   Testing/Procedures: none   Follow-Up: At [facility], you and your health needs are our priority.  As part of our continuing mission to provide you with exceptional heart care, we have created designated Provider Care Teams.  These Care Teams include your primary Cardiologist (physician) and Advanced Practice Providers (APPs -  Physician Assistants and Nurse Practitioners) who all work together to provide you with the care you need, when you need it.  We recommend signing up for the patient portal called "MyChart".  Sign up information is provided on this After Visit Summary.  MyChart is used to connect with patients for Virtual Visits (Telemedicine).  Patients are able to view lab/test results, encounter notes, upcoming appointments, etc.  Non-urgent messages can be sent to your provider as well.   To learn more about what you can do with MyChart, go to [email or web address].    Your next appointment:   6 month(s)  The format for your next appointment:   In Person  Provider:   Lance Muss, MD     Other Instructions

## 2021-07-12 DIAGNOSIS — M545 Low back pain, unspecified: Secondary | ICD-10-CM | POA: Diagnosis not present

## 2021-08-05 ENCOUNTER — Other Ambulatory Visit: Payer: Self-pay | Admitting: Interventional Cardiology

## 2021-08-15 ENCOUNTER — Emergency Department (HOSPITAL_COMMUNITY): Payer: PPO

## 2021-08-15 ENCOUNTER — Encounter: Payer: Self-pay | Admitting: Emergency Medicine

## 2021-08-15 ENCOUNTER — Emergency Department (HOSPITAL_COMMUNITY)
Admission: EM | Admit: 2021-08-15 | Discharge: 2021-08-15 | Disposition: A | Discharge status: Home / Self Care | Payer: PPO | Attending: Emergency Medicine | Admitting: Emergency Medicine

## 2021-08-15 ENCOUNTER — Ambulatory Visit
Admission: EM | Admit: 2021-08-15 | Discharge: 2021-08-15 | Disposition: A | Discharge status: Home / Self Care | Payer: PPO

## 2021-08-15 ENCOUNTER — Encounter (HOSPITAL_COMMUNITY): Payer: Self-pay

## 2021-08-15 ENCOUNTER — Other Ambulatory Visit: Payer: Self-pay

## 2021-08-15 DIAGNOSIS — S0990XA Unspecified injury of head, initial encounter: Secondary | ICD-10-CM

## 2021-08-15 DIAGNOSIS — W208XXA Other cause of strike by thrown, projected or falling object, initial encounter: Secondary | ICD-10-CM | POA: Diagnosis not present

## 2021-08-15 DIAGNOSIS — Y92810 Car as the place of occurrence of the external cause: Secondary | ICD-10-CM | POA: Diagnosis not present

## 2021-08-15 DIAGNOSIS — R011 Cardiac murmur, unspecified: Secondary | ICD-10-CM | POA: Diagnosis not present

## 2021-08-15 DIAGNOSIS — Z7901 Long term (current) use of anticoagulants: Secondary | ICD-10-CM | POA: Diagnosis not present

## 2021-08-15 DIAGNOSIS — S0003XA Contusion of scalp, initial encounter: Secondary | ICD-10-CM | POA: Diagnosis not present

## 2021-08-15 DIAGNOSIS — R030 Elevated blood-pressure reading, without diagnosis of hypertension: Secondary | ICD-10-CM | POA: Diagnosis not present

## 2021-08-15 DIAGNOSIS — I10 Essential (primary) hypertension: Secondary | ICD-10-CM | POA: Diagnosis not present

## 2021-08-15 NOTE — Discharge Instructions (Signed)
Please go to the emergency department as soon as you leave urgent care for further evaluation and management. ?

## 2021-08-15 NOTE — Discharge Instructions (Addendum)
Monitor your blood pressure readings at home.  This needs to be rechecked by either your primary care doctor or your cardiologist.  Contact Dr. Eldridge Dace to discuss the frequency with which you are using her nitroglycerin.  Return to the emergency room if you have any worsening symptoms. ?

## 2021-08-15 NOTE — ED Provider Notes (Signed)
[facility]    CSN: [id number] Arrival date & time: 08/15/21  1529      History   Chief Complaint Chief Complaint  Patient presents with   Head Injury    HPI Mikeya Tomasetti is a 86 y.o. female.   Patient presents with head injury that occurred at approximately 11:30 AM today.  Patient reports that she was changing the battery in her car when the hood of the car dropped and landed directly on her head.  She has been having associated headache but denies blurred vision, dizziness, nausea, vomiting.  She denies that she takes any type of blood thinners.  Denies loss of consciousness.   Head Injury  Past Medical History:  Diagnosis Date   Aortic stenosis    mild by echo in 03/2020   Arthritis    CAD (coronary artery disease)    a. 02/2008 Cath: EF 60%, RCA 100 w/ L->R collats, LM 20d, RI 60-70, small, LAD 60/81m, 99d, D1 50, OM1 50. Poor distal targets for CABG/no good interventional target -> medical Rx; b. Myoview in [place] in 3/11 -nl;  c. 07/2011 MV: EF 67%, mild inf ischemia->Med Rx; 02/2015 MV: EF 50%, small, mod intensity apical and apical inf rev defect-->Med Rx.   Chronic cough    hx; ACEI stopped, possible contribution from GERD   Chronic diastolic CHF (congestive heart failure) (HCC)    Echocardiogram 10/21: EF 60-65, no RWMA, mild LVH, Gr 2 DD, normal RVSF, severe LAE, small pericardial effusion, trivial MR, mild AS (mean 11 mmHg)   Depression    Dyspnea    a. 08/2013 Echo: EF 60-65%, mild AS;  b. PFTs (10/10): FVC 103%, ratio 74%. mild obstruction; c. CT chest (6/11) no evidence for interstitial lung dz. possible asthma/upper airway cough sydrome. aspirin & beta blockers- potential causes for bronchospasm;   GERD (gastroesophageal reflux disease)    HBV (hepatitis B virus) infection    HCV (hepatitis C virus)    History of PAC's    hx   History of PVC's    hx   HLD (hyperlipidemia)    unable to tolerate most statins due to myalgias and elevated CK.  thinks pravastatin casued a rash. Only tolerates crestor.   Hypertensive heart disease    IBS (irritable bowel syndrome)    Obesity    Osteoarthritis    knee; s/p TKR   Osteoporosis    Pancreatitis    hx   Pericardial effusion    small by echo in 03/2020 // Limited Echocardiogram 1/22: EF 60-65, no RWMA, mod LVH, Gr 1 DD, normal RVSF, mild LAE, mild MR, mild AS mean 8.5 mmHg), no pericardial effusion      PUD (peptic ulcer disease)    Sleep apnea    diagnosed in 2011 , did not want CPAP -     Patient Active Problem List   Diagnosis Date Noted   Central retinal vein occlusion with macular edema of right eye 05/20/2020   Retinal microaneurysm of right eye 05/20/2020   Nuclear sclerotic cataract of right eye 05/20/2020   Nuclear sclerotic cataract of left eye 05/20/2020   Retinal hemorrhage of right eye 05/20/2020   Statin myopathy 03/08/2019   Mild aortic stenosis 07/13/2017   Pneumonia due to organism 07/04/2016   Hypertensive heart disease    CAD (coronary artery disease)    Hyperlipidemia LDL goal <70    Morbid obesity due to excess calories (HCC)    Dyspnea 12/23/2013  Sleep apnea    PVC (premature ventricular contraction)    Rheumatoid arthritis(714.0) 12/29/2011   Shoulder pain 08/29/2011   Cough variant asthma vs UACS with VCD  12/10/2009   Upper airway cough syndrome 02/13/2009   MURMUR 08/21/2008   Coronary artery disease involving native coronary artery of native heart without angina pectoris 05/19/2008   KNEE PAIN 05/26/2007   HEPATITIS C 02/06/2007   Hyperlipidemia 02/06/2007   DEPRESSION 02/06/2007   Essential hypertension 02/06/2007   IRRITABLE BOWEL SYNDROME 02/06/2007   OSTEOPOROSIS 02/06/2007   PANCREATITIS, HX OF 02/06/2007   ANXIETY 12/22/2006   GERD 12/22/2006   HEPATITIS B, HX OF 12/22/2006    Past Surgical History:  Procedure Laterality Date   BREAST EXCISIONAL BIOPSY Right 1990   calcification of right breast-lumps Right 1998   CARDIAC  CATHETERIZATION  2009   CHOLECYSTECTOMY     hyesterectomy, unspec. area  1998   pancreatic duct stent     TUBAL LIGATION  1995     OB History   No obstetric history on file.      Home Medications    Prior to Admission medications   Medication Sig Start Date End Date Taking? Authorizing Provider  acetaminophen (TYLENOL) 325 MG tablet Per bottle as needed    [provider]  albuterol (PROVENTIL) (2.5 MG/3ML) 0.083% nebulizer solution Take 3 mLs (2.5 mg total) by nebulization every 6 (six) hours as needed for wheezing or shortness of breath. 05/31/16   Nyoka Cowden, MD  amLODipine (NORVASC) 5 MG tablet Take 1 tablet (5 mg total) by mouth daily. 07/05/21   Corky Crafts, MD  budesonide-formoterol [facility]) 160-4.5 MCG/ACT inhaler Inhale 2 puffs into the lungs 2 (two) times daily as needed. 06/30/20   Corky Crafts, MD  clopidogrel (PLAVIX) 75 MG tablet TAKE 1 TABLET BY MOUTH EVERY DAY 04/26/21   Corky Crafts, MD  ezetimibe (ZETIA) 10 MG tablet Take 10 mg by mouth daily.    [provider]  furosemide (LASIX) 40 MG tablet Take 1 tablet (40 mg total) by mouth daily. 07/01/20   Corky Crafts, MD  ketorolac (TORADOL) 30 MG/ML injection Inject 30 mg into the vein once.    [provider]  LINZESS 145 MCG CAPS capsule Take 145 mcg by mouth as needed. 02/09/19   [provider]  nitroGLYCERIN (NITROSTAT) 0.4 MG SL tablet PLACE 1 TABLET UNDER THE TONGUE EVERY 5 MINUTES AS NEEDED. 08/05/21   Corky Crafts, MD  pantoprazole (PROTONIX) 40 MG tablet Take 40 mg by mouth daily. 02/21/20   [provider]  potassium chloride SA (KLOR-CON) 20 MEQ tablet Take 1 tablet (20 mEq total) by mouth daily. 07/01/20   Corky Crafts, MD  rosuvastatin (CRESTOR) 10 MG tablet Take 1 tablet (10 mg total) by mouth daily. 07/05/21   Corky Crafts, MD  telmisartan (MICARDIS) 80 MG tablet TAKE 1 TABLET BY MOUTH EVERY DAY 12/14/20    Corky Crafts, MD  Vitamin D, Ergocalciferol, (DRISDOL) 1.25 MG (50000 UNIT) CAPS capsule Take 50,000 Units by mouth once a week. 05/25/20   [provider]    Family History Family History  Problem Relation Age of Onset   Asthma Mother    Stroke Mother        massive   Arthritis/Rheumatoid Mother    Liver disease Father    Asthma Sister    Stroke Sister    COPD Sister    Emphysema Sister  Colon cancer Maternal Aunt    Lung cancer Maternal Aunt    Pancreatic cancer Brother     Social History Social History   Tobacco Use   Smoking status: Never   Smokeless tobacco: Never   Tobacco comments:    2nd hand exposure x40 years   Vaping Use   Vaping Use: Never used  Substance Use Topics   Alcohol use: No   Drug use: No     Allergies   Aspirin, Clarithromycin, Doxycycline, Levaquin [levofloxacin in d5w], Penicillins, and Pravastatin   Review of Systems Review of Systems Per HPI  Physical Exam Triage Vital Signs ED Triage Vitals  Enc Vitals Group     BP 08/15/21 1540 (!) 175/93     Pulse Rate 08/15/21 1540 66     Resp 08/15/21 1540 16     Temp 08/15/21 1540 97.6 F (36.4 C)     Temp Source 08/15/21 1540 Oral     SpO2 08/15/21 1540 96 %     Weight --      Height --      Head Circumference --      Peak Flow --      Pain Score 08/15/21 1542 0     Pain Loc --      Pain Edu? --      Excl. in GC? --    No data found.  Updated Vital Signs BP (!) 175/93 (BP Location: Left Arm)    Pulse 66    Temp 97.6 F (36.4 C) (Oral)    Resp 16    SpO2 96%   Visual Acuity Right Eye Distance:   Left Eye Distance:   Bilateral Distance:    Right Eye Near:   Left Eye Near:    Bilateral Near:     Physical Exam Constitutional:      General: She is not in acute distress.    Appearance: Normal appearance. She is not toxic-appearing or diaphoretic.  HENT:     Head: Normocephalic and atraumatic.  Eyes:     Extraocular Movements: Extraocular movements  intact.     Conjunctiva/sclera: Conjunctivae normal.  Pulmonary:     Effort: Pulmonary effort is normal.  Skin:    Comments: Patient has approximately 3 inch in diameter area of swelling located to right lateral head.  No bleeding noted.  No lacerations or abrasions noted.  Neurological:     General: No focal deficit present.     Mental Status: She is alert and oriented to person, place, and time. Mental status is at baseline.     Cranial Nerves: Cranial nerves 2-12 are intact.     Sensory: Sensation is intact.     Motor: Motor function is intact.     Coordination: Coordination is intact.     Gait: Gait is intact.  Psychiatric:        Mood and Affect: Mood normal.        Behavior: Behavior normal.        Thought Content: Thought content normal.        Judgment: Judgment normal.     UC Treatments / Results  Labs (all labs ordered are listed, but only abnormal results are displayed) Labs Reviewed - No data to display  EKG   Radiology No results found.  Procedures Procedures (including critical care time)  Medications Ordered in UC Medications - No data to display  Initial Impression / Assessment and Plan / UC Course  I have reviewed the triage  vital signs and the nursing notes.  Pertinent labs & imaging results that were available during my care of the patient were reviewed by me and considered in my medical decision making (see chart for details).     Due to patient's age and associated head injury, I do think the patient needs CT imaging of head.  I do not have the ability to order CT image here at the urgent care.  Advised patient to go to the ER for further evaluation and management.  Neuro exam and vital signs are stable at discharge.  Agree with patient self transport to the hospital. Final Clinical Impressions(s) / UC Diagnoses   Final diagnoses:  Injury of head, initial encounter     Discharge Instructions      Please go to the emergency department as  soon as you leave urgent care for further evaluation and management.    ED Prescriptions   None    PDMP not reviewed this encounter.   Gustavus Bryant, [place] 08/15/21 1556

## 2021-08-15 NOTE — ED Triage Notes (Signed)
Went to check the car battery, hood of car slipped and hit in back of head around 11 am this morning. Denies any plavix/aspirin/blood thinner use currently. Reports mild headache. Denies dizziness, nausea, loss of consciousness.  ?

## 2021-08-15 NOTE — ED Triage Notes (Signed)
Pt reports attempting to check her car battery and the hood of her car fell and hit her head. Denies pain, blurred vision, dizziness, nausea, and confusion. Pt denies LOC. Denies taking blood thinners.  ?

## 2021-08-15 NOTE — ED Provider Notes (Addendum)
[facility]-EMERGENCY DEPT Provider Note   CSN: [id number] Arrival date & time: 08/15/21  1612     History  Chief Complaint  Patient presents with   Head Injury    [name] [name] is a 86 y.o. female.  Patient is a 86 year old female who presents with a head injury.  She said that her car battery had died and she was lifting the hood of a car to check on it and before she noticed the hood fell on her head.  It fell on the right side of her head.  She had no loss of consciousness.  No dizziness.  No nausea or vomiting.  She is on Plavix.  She denies any neck or back pain.  No other injuries.      Home Medications Prior to Admission medications   Medication Sig Start Date End Date Taking? Authorizing Provider  clopidogrel (PLAVIX) 75 MG tablet TAKE 1 TABLET BY MOUTH EVERY DAY 04/26/21  Yes Corky Crafts, MD  ezetimibe (ZETIA) 10 MG tablet Take 10 mg by mouth daily.   Yes [provider]  furosemide (LASIX) 40 MG tablet Take 1 tablet (40 mg total) by mouth daily. 07/01/20  Yes Corky Crafts, MD  LINZESS 145 MCG CAPS capsule Take 145 mcg by mouth as needed. 02/09/19  Yes [provider]  nitroGLYCERIN (NITROSTAT) 0.4 MG SL tablet PLACE 1 TABLET UNDER THE TONGUE EVERY 5 MINUTES AS NEEDED. 08/05/21  Yes Corky Crafts, MD  pantoprazole (PROTONIX) 40 MG tablet Take 40 mg by mouth daily. 02/21/20  Yes [provider]  potassium chloride SA (KLOR-CON) 20 MEQ tablet Take 1 tablet (20 mEq total) by mouth daily. 07/01/20  Yes Corky Crafts, MD  rosuvastatin (CRESTOR) 10 MG tablet Take 1 tablet (10 mg total) by mouth daily. 07/05/21  Yes Corky Crafts, MD  telmisartan (MICARDIS) 80 MG tablet TAKE 1 TABLET BY MOUTH EVERY DAY 12/14/20  Yes Corky Crafts, MD  Vitamin D, Ergocalciferol, (DRISDOL) 1.25 MG (50000 UNIT) CAPS capsule Take 50,000 Units by mouth once a week. 05/25/20  Yes [provider]   acetaminophen (TYLENOL) 325 MG tablet Per bottle as needed    [provider]  albuterol (PROVENTIL) (2.5 MG/3ML) 0.083% nebulizer solution Take 3 mLs (2.5 mg total) by nebulization every 6 (six) hours as needed for wheezing or shortness of breath. 05/31/16   Nyoka Cowden, MD  amLODipine (NORVASC) 5 MG tablet Take 1 tablet (5 mg total) by mouth daily. 07/05/21   Corky Crafts, MD  budesonide-formoterol [facility]) 160-4.5 MCG/ACT inhaler Inhale 2 puffs into the lungs 2 (two) times daily as needed. 06/30/20   Corky Crafts, MD  ketorolac (TORADOL) 30 MG/ML injection Inject 30 mg into the vein once.    [provider]      Allergies    Aspirin, Clarithromycin, Doxycycline, Levaquin [levofloxacin in d5w], Penicillins, and Pravastatin    Review of Systems   Review of Systems  Constitutional:  Negative for chills, diaphoresis, fatigue and fever.  HENT:  Negative for congestion, rhinorrhea and sneezing.   Eyes: Negative.   Respiratory:  Negative for cough, chest tightness and shortness of breath.   Cardiovascular:  Negative for chest pain and leg swelling.  Gastrointestinal:  Negative for abdominal pain, blood in stool, diarrhea, nausea and vomiting.  Genitourinary:  Negative for difficulty urinating, flank pain, frequency and hematuria.  Musculoskeletal:  Negative for arthralgias and back pain.  Skin:  Negative  for rash.  Neurological:  Positive for headaches. Negative for dizziness, speech difficulty, weakness and numbness.   Physical Exam Updated Vital Signs BP (!) 186/102    Pulse 65    Temp (!) 97.5 F (36.4 C) (Oral)    Resp 16    SpO2 100%  Physical Exam Constitutional:      Appearance: She is well-developed.  HENT:     Head:     Comments: Hematoma to her right parietal scalp area, no open wounds Eyes:     Pupils: Pupils are equal, round, and reactive to light.  Neck:     Comments: No tenderness to the cervical, thoracic or lumbosacral  spine Cardiovascular:     Rate and Rhythm: Normal rate and regular rhythm.     Heart sounds: Murmur heard.  Pulmonary:     Effort: Pulmonary effort is normal. No respiratory distress.     Breath sounds: Normal breath sounds. No wheezing or rales.  Chest:     Chest wall: No tenderness.  Abdominal:     General: Bowel sounds are normal.     Palpations: Abdomen is soft.     Tenderness: There is no abdominal tenderness. There is no guarding or rebound.  Musculoskeletal:        General: Normal range of motion.     Cervical back: Normal range of motion and neck supple.  Lymphadenopathy:     Cervical: No cervical adenopathy.  Skin:    General: Skin is warm and dry.     Findings: No rash.  Neurological:     General: No focal deficit present.     Mental Status: She is alert and oriented to person, place, and time.    ED Results / Procedures / Treatments   Labs (all labs ordered are listed, but only abnormal results are displayed) Labs Reviewed - No data to display  EKG None  Radiology CT Head Wo Contrast  Result Date: 08/15/2021 CLINICAL DATA:  Head trauma, minor (Age >= 65y) EXAM: CT HEAD WITHOUT CONTRAST TECHNIQUE: Contiguous axial images were obtained from the base of the skull through the vertex without intravenous contrast. RADIATION DOSE REDUCTION: This exam was performed according to the departmental dose-optimization program which includes automated exposure control, adjustment of the mA and/or kV according to patient size and/or use of iterative reconstruction technique. COMPARISON:  12/23/2017 FINDINGS: Brain: There is atrophy and chronic small vessel disease changes. No acute intracranial abnormality. Specifically, no hemorrhage, hydrocephalus, mass lesion, acute infarction, or significant intracranial injury. Vascular: No hyperdense vessel or unexpected calcification. Skull: No acute calvarial abnormality. Sinuses/Orbits: No acute findings Other: None IMPRESSION: Atrophy,  chronic microvascular disease. No acute intracranial abnormality. Electronically Signed   By: Charlett Nose M.D.   On: 08/15/2021 17:33    Procedures Procedures    Medications Ordered in ED Medications - No data to display  ED Course/ Medical Decision Making/ A&P                           Medical Decision Making Amount and/or Complexity of Data Reviewed Radiology: ordered.   Patient is a 86 year old female who presents after a head injury.  She is neurologically intact.  No vomiting or ataxia.  She had a head CT which showed no acute abnormality.  This was reviewed by me.  No evidence of intracranial hemorrhage.  She is on Plavix but no anticoagulants.  She did not take her Plavix today.  The incident  happened about 11:00 this morning.  Her blood pressure was also noted to be elevated.  She states that she did not take her medications today except for her nitroglycerin.  I asked her why she takes nitroglycerin and she says that she has some bad heartburn type pain that happens several times a day which for which she takes nitroglycerin.  She and her daughter states that she normally takes it 3-4 times a day.  This is been going on for over a year.  She has had the same type of pains for several years but she has been taking nitroglycerin for it for the last year at least.  She does not have any change in symptoms.  No current chest pain or shortness of breath.  I was concerned with the frequency with which she is taking nitroglycerin.  She states that she was told by her cardiologist that she can take it as much as she needs to.  I advised her and her daughter that it might be better to clarify this with Dr. Eldridge Dace  and to make sure that he knows the frequency with which she is taking the nitroglycerin.  She does not seem to have any recent change in symptoms.  She does not have any symptoms related to her elevated blood pressure.  She was advised to take her medication when she gets home and  monitor her blood pressures at home.  She will need to have this rechecked by either her primary care doctor or cardiologist.  Return precautions and head injury precautions were given.  Final Clinical Impression(s) / ED Diagnoses Final diagnoses:  Injury of head, initial encounter  Elevated blood pressure reading    Rx / DC Orders ED Discharge Orders     None         Rolan Bucco, MD 08/15/21 1819    Rolan Bucco, MD 08/15/21 1921

## 2021-09-04 ENCOUNTER — Other Ambulatory Visit: Payer: Self-pay | Admitting: Interventional Cardiology

## 2021-09-11 ENCOUNTER — Other Ambulatory Visit: Payer: Self-pay | Admitting: Interventional Cardiology

## 2021-09-14 DIAGNOSIS — I2510 Atherosclerotic heart disease of native coronary artery without angina pectoris: Secondary | ICD-10-CM | POA: Diagnosis not present

## 2021-09-14 DIAGNOSIS — R2232 Localized swelling, mass and lump, left upper limb: Secondary | ICD-10-CM | POA: Diagnosis not present

## 2021-09-14 DIAGNOSIS — I10 Essential (primary) hypertension: Secondary | ICD-10-CM | POA: Diagnosis not present

## 2021-09-14 DIAGNOSIS — J449 Chronic obstructive pulmonary disease, unspecified: Secondary | ICD-10-CM | POA: Diagnosis not present

## 2021-09-14 DIAGNOSIS — I700 Atherosclerosis of aorta: Secondary | ICD-10-CM | POA: Diagnosis not present

## 2021-09-14 DIAGNOSIS — G4733 Obstructive sleep apnea (adult) (pediatric): Secondary | ICD-10-CM | POA: Diagnosis not present

## 2021-09-22 DIAGNOSIS — G4733 Obstructive sleep apnea (adult) (pediatric): Secondary | ICD-10-CM | POA: Diagnosis not present

## 2021-09-29 ENCOUNTER — Other Ambulatory Visit: Payer: PPO | Admitting: *Deleted

## 2021-09-29 DIAGNOSIS — I5032 Chronic diastolic (congestive) heart failure: Secondary | ICD-10-CM | POA: Diagnosis not present

## 2021-09-29 DIAGNOSIS — E785 Hyperlipidemia, unspecified: Secondary | ICD-10-CM | POA: Diagnosis not present

## 2021-09-29 DIAGNOSIS — I25118 Atherosclerotic heart disease of native coronary artery with other forms of angina pectoris: Secondary | ICD-10-CM | POA: Diagnosis not present

## 2021-09-30 LAB — COMPREHENSIVE METABOLIC PANEL
ALT: 8 IU/L (ref 0–32)
AST: 13 IU/L (ref 0–40)
Albumin/Globulin Ratio: 1.8 (ref 1.2–2.2)
Albumin: 3.9 g/dL (ref 3.6–4.6)
Alkaline Phosphatase: 75 IU/L (ref 44–121)
BUN/Creatinine Ratio: 16 (ref 12–28)
BUN: 19 mg/dL (ref 8–27)
Bilirubin Total: 0.2 mg/dL (ref 0.0–1.2)
CO2: 21 mmol/L (ref 20–29)
Calcium: 8.9 mg/dL (ref 8.7–10.3)
Chloride: 110 mmol/L — ABNORMAL HIGH (ref 96–106)
Creatinine, Ser: 1.19 mg/dL — ABNORMAL HIGH (ref 0.57–1.00)
Globulin, Total: 2.2 g/dL (ref 1.5–4.5)
Glucose: 100 mg/dL — ABNORMAL HIGH (ref 70–99)
Potassium: 4.2 mmol/L (ref 3.5–5.2)
Sodium: 145 mmol/L — ABNORMAL HIGH (ref 134–144)
Total Protein: 6.1 g/dL (ref 6.0–8.5)
eGFR: 45 mL/min/{1.73_m2} — ABNORMAL LOW (ref >59)

## 2021-09-30 LAB — LIPID PANEL
Chol/HDL Ratio: 3.4 ratio (ref 0.0–4.4)
Cholesterol, Total: 148 mg/dL (ref 100–199)
HDL: 43 mg/dL (ref >39)
LDL Chol Calc (NIH): 90 mg/dL (ref 0–99)
Triglycerides: 77 mg/dL (ref 0–149)
VLDL Cholesterol Cal: 15 mg/dL (ref 5–40)

## 2021-10-04 ENCOUNTER — Telehealth: Payer: Self-pay | Admitting: Interventional Cardiology

## 2021-10-04 NOTE — Telephone Encounter (Signed)
Calling with questions conerning her labs and to ask bout the consistent burping she is having. Please advise  ?

## 2021-10-04 NOTE — Telephone Encounter (Signed)
Left message to call office

## 2021-10-04 NOTE — Telephone Encounter (Signed)
I spoke with patient and reviewed lab results with her.  Patient reports she has been having stomach pain, burping and constipation.  No chest pain.  Is seeing GI soon.  ?

## 2021-10-09 ENCOUNTER — Ambulatory Visit (HOSPITAL_COMMUNITY)
Admission: EM | Admit: 2021-10-09 | Discharge: 2021-10-09 | Disposition: A | Discharge status: Home / Self Care | Payer: PPO

## 2021-10-09 ENCOUNTER — Encounter (HOSPITAL_COMMUNITY): Payer: Self-pay

## 2021-10-09 DIAGNOSIS — R194 Change in bowel habit: Secondary | ICD-10-CM

## 2021-10-09 DIAGNOSIS — K5903 Drug induced constipation: Secondary | ICD-10-CM

## 2021-10-09 DIAGNOSIS — T402X5A Adverse effect of other opioids, initial encounter: Secondary | ICD-10-CM

## 2021-10-09 NOTE — ED Triage Notes (Signed)
One week h/o constipation and frequent belching. Has been taking miralax and suppositories w/o relief. Pt reports she feels back up into her chest.  ?

## 2021-10-09 NOTE — Discharge Instructions (Signed)
Get a bottle of Magnesium Citra and drink it all today ?If you do not empty by the morning, go to the ER. ?If you develop sever abdominal pain, go to the ER ?

## 2021-10-09 NOTE — ED Provider Notes (Signed)
[facility]    CSN: [id number] Arrival date & time: 10/09/21  1718      History   Chief Complaint Chief Complaint  Patient presents with   Constipation    HPI [name] [name] is a 86 y.o. female who presents with constipation for one week. Has been belching a lot. She denies abdominal pain or distention. Has not had a colonoscopy in a long time, cant recall how long. She denies hx of constipation, so this is new for her. She started taking Murelax  3 days ago and has not helped. Tried fleets enema and did not work. She denies having abdominal pain. Has an appointment with Dr Loreta Ave. ( GI) in 3 days.     Past Medical History:  Diagnosis Date   Aortic stenosis    mild by echo in 03/2020   Arthritis    CAD (coronary artery disease)    a. 02/2008 Cath: EF 60%, RCA 100 w/ L->R collats, LM 20d, RI 60-70, small, LAD 60/33m, 99d, D1 50, OM1 50. Poor distal targets for CABG/no good interventional target -> medical Rx; b. Myoview in [place] in 3/11 -nl;  c. 07/2011 MV: EF 67%, mild inf ischemia->Med Rx; 02/2015 MV: EF 50%, small, mod intensity apical and apical inf rev defect-->Med Rx.   Chronic cough    hx; ACEI stopped, possible contribution from GERD   Chronic diastolic CHF (congestive heart failure) (HCC)    Echocardiogram 10/21: EF 60-65, no RWMA, mild LVH, Gr 2 DD, normal RVSF, severe LAE, small pericardial effusion, trivial MR, mild AS (mean 11 mmHg)   Depression    Dyspnea    a. 08/2013 Echo: EF 60-65%, mild AS;  b. PFTs (10/10): FVC 103%, ratio 74%. mild obstruction; c. CT chest (6/11) no evidence for interstitial lung dz. possible asthma/upper airway cough sydrome. aspirin & beta blockers- potential causes for bronchospasm;   GERD (gastroesophageal reflux disease)    HBV (hepatitis B virus) infection    HCV (hepatitis C virus)    History of PAC's    hx   History of PVC's    hx   HLD (hyperlipidemia)    unable to tolerate most statins due to myalgias and elevated CK.  thinks pravastatin casued a rash. Only tolerates crestor.   Hypertensive heart disease    IBS (irritable bowel syndrome)    Obesity    Osteoarthritis    knee; s/p TKR   Osteoporosis    Pancreatitis    hx   Pericardial effusion    small by echo in 03/2020 // Limited Echocardiogram 1/22: EF 60-65, no RWMA, mod LVH, Gr 1 DD, normal RVSF, mild LAE, mild MR, mild AS mean 8.5 mmHg), no pericardial effusion      PUD (peptic ulcer disease)    Sleep apnea    diagnosed in 2011 , did not want CPAP -     Patient Active Problem List   Diagnosis Date Noted   Central retinal vein occlusion with macular edema of right eye 05/20/2020   Retinal microaneurysm of right eye 05/20/2020   Nuclear sclerotic cataract of right eye 05/20/2020   Nuclear sclerotic cataract of left eye 05/20/2020   Retinal hemorrhage of right eye 05/20/2020   Statin myopathy 03/08/2019   Mild aortic stenosis 07/13/2017   Pneumonia due to organism 07/04/2016   Hypertensive heart disease    CAD (coronary artery disease)    Hyperlipidemia LDL goal <70    Morbid obesity due to excess calories (HCC)  Dyspnea 12/23/2013   Sleep apnea    PVC (premature ventricular contraction)    Rheumatoid arthritis(714.0) 12/29/2011   Shoulder pain 08/29/2011   Cough variant asthma vs UACS with VCD  12/10/2009   Upper airway cough syndrome 02/13/2009   MURMUR 08/21/2008   Coronary artery disease involving native coronary artery of native heart without angina pectoris 05/19/2008   KNEE PAIN 05/26/2007   HEPATITIS C 02/06/2007   Hyperlipidemia 02/06/2007   DEPRESSION 02/06/2007   Essential hypertension 02/06/2007   IRRITABLE BOWEL SYNDROME 02/06/2007   OSTEOPOROSIS 02/06/2007   PANCREATITIS, HX OF 02/06/2007   ANXIETY 12/22/2006   GERD 12/22/2006   HEPATITIS B, HX OF 12/22/2006    Past Surgical History:  Procedure Laterality Date   BREAST EXCISIONAL BIOPSY Right 1990   calcification of right breast-lumps Right 1998   CARDIAC  CATHETERIZATION  2009   CHOLECYSTECTOMY     hyesterectomy, unspec. area  1998   pancreatic duct stent     TUBAL LIGATION  1995     OB History   No obstetric history on file.      Home Medications    Prior to Admission medications   Medication Sig Start Date End Date Taking? Authorizing Provider  acetaminophen (TYLENOL) 325 MG tablet Per bottle as needed    [provider]  albuterol (PROVENTIL) (2.5 MG/3ML) 0.083% nebulizer solution Take 3 mLs (2.5 mg total) by nebulization every 6 (six) hours as needed for wheezing or shortness of breath. 05/31/16   Nyoka Cowden, MD  amLODipine (NORVASC) 5 MG tablet Take 1 tablet (5 mg total) by mouth daily. 07/05/21   Corky Crafts, MD  budesonide-formoterol [facility]) 160-4.5 MCG/ACT inhaler Inhale 2 puffs into the lungs 2 (two) times daily as needed. 06/30/20   Corky Crafts, MD  clopidogrel (PLAVIX) 75 MG tablet TAKE 1 TABLET BY MOUTH EVERY DAY 04/26/21   Corky Crafts, MD  ezetimibe (ZETIA) 10 MG tablet Take 10 mg by mouth daily.    [provider]  furosemide (LASIX) 40 MG tablet TAKE 1 TABLET BY MOUTH EVERY DAY 09/13/21   Corky Crafts, MD  ketorolac (TORADOL) 30 MG/ML injection Inject 30 mg into the vein once.    [provider]  KLOR-CON M20 20 MEQ tablet TAKE 1 TABLET BY MOUTH EVERY DAY 09/13/21   Corky Crafts, MD  LINZESS 145 MCG CAPS capsule Take 145 mcg by mouth as needed. 02/09/19   [provider]  nitroGLYCERIN (NITROSTAT) 0.4 MG SL tablet PLACE 1 TABLET UNDER THE TONGUE EVERY 5 MINUTES AS NEEDED. 08/05/21   Corky Crafts, MD  pantoprazole (PROTONIX) 40 MG tablet Take 40 mg by mouth daily. 02/21/20   [provider]  rosuvastatin (CRESTOR) 10 MG tablet Take 1 tablet (10 mg total) by mouth daily. 07/05/21   Corky Crafts, MD  telmisartan (MICARDIS) 80 MG tablet TAKE 1 TABLET BY MOUTH EVERY DAY 09/06/21   Corky Crafts, MD  Vitamin D,  Ergocalciferol, (DRISDOL) 1.25 MG (50000 UNIT) CAPS capsule Take 50,000 Units by mouth once a week. 05/25/20   [provider]    Family History Family History  Problem Relation Age of Onset   Asthma Mother    Stroke Mother        massive   Arthritis/Rheumatoid Mother    Liver disease Father    Asthma Sister    Stroke Sister    COPD Sister    Emphysema Sister    Colon  cancer Maternal Aunt    Lung cancer Maternal Aunt    Pancreatic cancer Brother     Social History Social History   Tobacco Use   Smoking status: Never   Smokeless tobacco: Never   Tobacco comments:    2nd hand exposure x40 years   Vaping Use   Vaping Use: Never used  Substance Use Topics   Alcohol use: No   Drug use: No     Allergies   Aspirin, Clarithromycin, Doxycycline, Levaquin [levofloxacin in d5w], Penicillins, and Pravastatin   Review of Systems Review of Systems  Gastrointestinal:  Positive for constipation. Negative for abdominal pain, diarrhea, nausea and vomiting.  Genitourinary:  Negative for difficulty urinating.    Physical Exam Triage Vital Signs ED Triage Vitals  Enc Vitals Group     BP 10/09/21 1754 128/84     Pulse Rate 10/09/21 1754 70     Resp 10/09/21 1754 17     Temp 10/09/21 1754 98.2 F (36.8 C)     Temp Source 10/09/21 1754 Oral     SpO2 10/09/21 1754 97 %     Weight --      Height --      Head Circumference --      Peak Flow --      Pain Score 10/09/21 1753 0     Pain Loc --      Pain Edu? --      Excl. in GC? --    No data found.  Updated Vital Signs BP 128/84 (BP Location: Right Arm)   Pulse 70   Temp 98.2 F (36.8 C) (Oral)   Resp 17   SpO2 97%   Visual Acuity Right Eye Distance:   Left Eye Distance:   Bilateral Distance:    Right Eye Near:   Left Eye Near:    Bilateral Near:     Physical Exam Vitals and nursing note reviewed.  Constitutional:      General: She is not in acute distress.    Appearance: She is obese. She is not  toxic-appearing.  HENT:     Right Ear: External ear normal.     Left Ear: External ear normal.  Eyes:     General: No scleral icterus. Pulmonary:     Effort: Pulmonary effort is normal.  Abdominal:     General: Bowel sounds are normal. There is no distension.     Palpations: Abdomen is soft. There is no mass.     Tenderness: There is no abdominal tenderness. There is no guarding or rebound.  Musculoskeletal:        General: Normal range of motion.     Cervical back: Neck supple.  Skin:    General: Skin is warm and dry.     Findings: No rash.  Neurological:     Mental Status: She is alert and oriented to person, place, and time.     Gait: Gait normal.  Psychiatric:        Mood and Affect: Mood normal.        Behavior: Behavior normal.        Thought Content: Thought content normal.        Judgment: Judgment normal.     UC Treatments / Results  Labs (all labs ordered are listed, but only abnormal results are displayed) Labs Reviewed - No data to display  EKG   Radiology No results found.  Procedures Procedures (including critical care time)  Medications Ordered in UC Medications -  No data to display  Initial Impression / Assessment and Plan / UC Course  I have reviewed the triage vital signs and the nursing notes.  Constipation Change in bowel habits See instructions.  Final Clinical Impressions(s) / UC Diagnoses   Final diagnoses:  Constipation due to opioid therapy     Discharge Instructions      Get a bottle of Magnesium Citra and drink it all today If you do not empty by the morning, go to the ER. If you develop sever abdominal pain, go to the ER    ED Prescriptions   None    PDMP not reviewed this encounter.   Garey Ham, [place] 10/09/21 1826

## 2021-10-11 DIAGNOSIS — K219 Gastro-esophageal reflux disease without esophagitis: Secondary | ICD-10-CM | POA: Diagnosis not present

## 2021-10-11 DIAGNOSIS — R140 Abdominal distension (gaseous): Secondary | ICD-10-CM | POA: Diagnosis not present

## 2021-10-11 DIAGNOSIS — K5900 Constipation, unspecified: Secondary | ICD-10-CM | POA: Diagnosis not present

## 2021-10-12 DIAGNOSIS — R079 Chest pain, unspecified: Secondary | ICD-10-CM | POA: Diagnosis not present

## 2021-10-12 DIAGNOSIS — K5904 Chronic idiopathic constipation: Secondary | ICD-10-CM | POA: Diagnosis not present

## 2021-10-12 DIAGNOSIS — K5730 Diverticulosis of large intestine without perforation or abscess without bleeding: Secondary | ICD-10-CM | POA: Diagnosis not present

## 2021-10-12 DIAGNOSIS — R140 Abdominal distension (gaseous): Secondary | ICD-10-CM | POA: Diagnosis not present

## 2021-10-20 DIAGNOSIS — M545 Low back pain, unspecified: Secondary | ICD-10-CM | POA: Diagnosis not present

## 2021-11-02 ENCOUNTER — Telehealth: Payer: Self-pay | Admitting: Interventional Cardiology

## 2021-11-02 NOTE — Telephone Encounter (Signed)
Pt c/o of Chest Pain: STAT if CP now or developed within 24 hours  1. Are you having CP right now? no  2. Are you experiencing any other symptoms (ex. SOB, nausea, vomiting, sweating)? SOB when the episodes happens.   3. How long have you been experiencing CP? This has been going on for about a month now  4. Is your CP continuous or coming and going? Comes and goes  5. Have you taken Nitroglycerin? Yes  She states she has chest tightness at night. The chest tightness also happens when she gets upset or when she eats.  She states when ever she has one of these episodes she will have to belch until everything cools back down.  ?

## 2021-11-02 NOTE — Telephone Encounter (Signed)
Called pt in regards to CP.  Reports would not call symptoms CP more like a burp that can not get rid of.  Feels it at top of stomach and between breast.  Notes only occurs when gets upset.  Has a spouse with dementia when pt becomes overwhelmed symptoms begin. Reports episodes last about 5-6 min will take 1-3 NTG.  NTG makes pt feel opened up and can breathe.  Symptoms mainly happen at night (every night).  This is when spouse dementia symptoms increases.  Has to sleep sitting propped up on pillows at night.  Feels that is sleep deprived.   Pt reports can perform ADL without SOB or symptoms described above.  Symptoms only occur when pt feels overwhelmed.  Denies nausea, jaw and arm pain.  BP today 109/60 reports yesterday when became upset BP 168/90 once calm BP lowered however, pt did not have BP reading.  Pt not currently having symptoms. Advised pt to reach out to PCP to notify of symptoms as they could be r/t anxiety.  Also, advised to mention sleeping propped up on pillows at night.  GERD medications may need to be adjusted.  Will route message to MD to be reviewed.

## 2021-11-02 NOTE — Telephone Encounter (Signed)
I agree that sx sound more like anxiety.  Watch for signs of fluid overload: weight gain and swelling. If they are occurring, let us know. Continue to monitor BP and let us know if she has high readings.

## 2021-11-03 DIAGNOSIS — E785 Hyperlipidemia, unspecified: Secondary | ICD-10-CM | POA: Diagnosis not present

## 2021-11-03 DIAGNOSIS — J449 Chronic obstructive pulmonary disease, unspecified: Secondary | ICD-10-CM | POA: Diagnosis not present

## 2021-11-03 DIAGNOSIS — I10 Essential (primary) hypertension: Secondary | ICD-10-CM | POA: Diagnosis not present

## 2021-11-03 DIAGNOSIS — I2510 Atherosclerotic heart disease of native coronary artery without angina pectoris: Secondary | ICD-10-CM | POA: Diagnosis not present

## 2021-11-03 NOTE — Telephone Encounter (Signed)
I spoke with patient and gave her information from Dr Varanasi.  

## 2021-11-04 ENCOUNTER — Other Ambulatory Visit: Payer: Self-pay | Admitting: Internal Medicine

## 2021-11-04 DIAGNOSIS — Z1231 Encounter for screening mammogram for malignant neoplasm of breast: Secondary | ICD-10-CM

## 2021-11-15 ENCOUNTER — Other Ambulatory Visit: Payer: Self-pay | Admitting: Interventional Cardiology

## 2021-11-23 DIAGNOSIS — G3184 Mild cognitive impairment, so stated: Secondary | ICD-10-CM | POA: Diagnosis not present

## 2021-11-23 DIAGNOSIS — F418 Other specified anxiety disorders: Secondary | ICD-10-CM | POA: Diagnosis not present

## 2021-11-25 ENCOUNTER — Emergency Department (HOSPITAL_COMMUNITY): Payer: PPO

## 2021-11-25 ENCOUNTER — Observation Stay (HOSPITAL_COMMUNITY)
Admission: EM | Admit: 2021-11-25 | Discharge: 2021-11-26 | Disposition: A | Discharge status: Home / Self Care | Payer: PPO | Attending: Internal Medicine | Admitting: Internal Medicine

## 2021-11-25 ENCOUNTER — Other Ambulatory Visit: Payer: Self-pay | Admitting: Interventional Cardiology

## 2021-11-25 ENCOUNTER — Encounter (HOSPITAL_COMMUNITY): Payer: Self-pay | Admitting: Emergency Medicine

## 2021-11-25 ENCOUNTER — Other Ambulatory Visit: Payer: Self-pay

## 2021-11-25 DIAGNOSIS — I10 Essential (primary) hypertension: Secondary | ICD-10-CM | POA: Diagnosis not present

## 2021-11-25 DIAGNOSIS — I214 Non-ST elevation (NSTEMI) myocardial infarction: Secondary | ICD-10-CM | POA: Diagnosis not present

## 2021-11-25 DIAGNOSIS — R7989 Other specified abnormal findings of blood chemistry: Secondary | ICD-10-CM | POA: Diagnosis present

## 2021-11-25 DIAGNOSIS — I5032 Chronic diastolic (congestive) heart failure: Secondary | ICD-10-CM | POA: Diagnosis not present

## 2021-11-25 DIAGNOSIS — R079 Chest pain, unspecified: Secondary | ICD-10-CM | POA: Diagnosis present

## 2021-11-25 DIAGNOSIS — R0789 Other chest pain: Secondary | ICD-10-CM | POA: Diagnosis not present

## 2021-11-25 DIAGNOSIS — R457 State of emotional shock and stress, unspecified: Secondary | ICD-10-CM | POA: Diagnosis not present

## 2021-11-25 DIAGNOSIS — E785 Hyperlipidemia, unspecified: Secondary | ICD-10-CM | POA: Diagnosis present

## 2021-11-25 DIAGNOSIS — N1832 Chronic kidney disease, stage 3b: Secondary | ICD-10-CM | POA: Diagnosis not present

## 2021-11-25 DIAGNOSIS — R778 Other specified abnormalities of plasma proteins: Secondary | ICD-10-CM | POA: Diagnosis not present

## 2021-11-25 DIAGNOSIS — Z79899 Other long term (current) drug therapy: Secondary | ICD-10-CM | POA: Diagnosis not present

## 2021-11-25 DIAGNOSIS — N289 Disorder of kidney and ureter, unspecified: Secondary | ICD-10-CM | POA: Diagnosis not present

## 2021-11-25 DIAGNOSIS — I130 Hypertensive heart and chronic kidney disease with heart failure and stage 1 through stage 4 chronic kidney disease, or unspecified chronic kidney disease: Secondary | ICD-10-CM | POA: Diagnosis not present

## 2021-11-25 DIAGNOSIS — I2510 Atherosclerotic heart disease of native coronary artery without angina pectoris: Secondary | ICD-10-CM | POA: Insufficient documentation

## 2021-11-25 DIAGNOSIS — Z7902 Long term (current) use of antithrombotics/antiplatelets: Secondary | ICD-10-CM | POA: Diagnosis not present

## 2021-11-25 DIAGNOSIS — D649 Anemia, unspecified: Secondary | ICD-10-CM | POA: Diagnosis not present

## 2021-11-25 DIAGNOSIS — I25119 Atherosclerotic heart disease of native coronary artery with unspecified angina pectoris: Secondary | ICD-10-CM | POA: Diagnosis present

## 2021-11-25 NOTE — ED Triage Notes (Signed)
Pt BIB EMS from home [facility] CP nonradiating starting this morning at 10am with cardiac hx. One nitro taken at home with little relief. Pt started taking new medication that is making pain worse, possible isosorbide. Hypertensive with EMS 194/114. 3 total nitro given BP now 150/90 with EMS.

## 2021-11-25 NOTE — ED Provider Notes (Incomplete)
[facility] EMERGENCY DEPARTMENT Provider Note   CSN: [id number] Arrival date & time: 11/25/21  2158     History {Add pertinent medical, surgical, social history, OB history to HPI:1} Chief Complaint  Patient presents with   Hypertension    [name] [name] is a 86 y.o. female.  The history is provided by the patient.  Hypertension  She has history of hypertension, hyperlipidemia, coronary artery disease, GERD and comes in because of an episode of chest pain.  Pain was over the lower sternal area.  She has been having pain there fairly constantly over the last 2 weeks, usually improved after taking nitroglycerin.  She was seen by a physician at [facility] and was told to take a long-acting nitroglycerin.  She took a dose this morning, and the pain became much worse.  There is no radiation of pain but there was associated dyspnea and diaphoresis.  There was no nausea.  EMS was called and noted blood pressure was markedly elevated at 194/114.  She was given 3 nitroglycerin and her pain is now completely gone.   Home Medications Prior to Admission medications   Medication Sig Start Date End Date Taking? Authorizing Provider  acetaminophen (TYLENOL) 325 MG tablet Per bottle as needed    [provider]  albuterol (PROVENTIL) (2.5 MG/3ML) 0.083% nebulizer solution Take 3 mLs (2.5 mg total) by nebulization every 6 (six) hours as needed for wheezing or shortness of breath. 05/31/16   Nyoka Cowden, MD  amLODipine (NORVASC) 5 MG tablet Take 1 tablet (5 mg total) by mouth daily. 07/05/21   Corky Crafts, MD  budesonide-formoterol [facility]) 160-4.5 MCG/ACT inhaler Inhale 2 puffs into the lungs 2 (two) times daily as needed. 06/30/20   Corky Crafts, MD  clopidogrel (PLAVIX) 75 MG tablet TAKE 1 TABLET BY MOUTH EVERY DAY 04/26/21   Corky Crafts, MD  ezetimibe (ZETIA) 10 MG tablet Take 10 mg by mouth daily.    [provider]  furosemide (LASIX) 40 MG tablet TAKE 1 TABLET BY MOUTH EVERY DAY 09/13/21   Corky Crafts, MD  ketorolac (TORADOL) 30 MG/ML injection Inject 30 mg into the vein once.    [provider]  KLOR-CON M20 20 MEQ tablet TAKE 1 TABLET BY MOUTH EVERY DAY 09/13/21   Corky Crafts, MD  LINZESS 145 MCG CAPS capsule Take 145 mcg by mouth as needed. 02/09/19   [provider]  nitroGLYCERIN (NITROSTAT) 0.4 MG SL tablet PLACE 1 TABLET UNDER THE TONGUE EVERY 5 MINUTES AS NEEDED. 11/16/21   Corky Crafts, MD  pantoprazole (PROTONIX) 40 MG tablet Take 40 mg by mouth daily. 02/21/20   [provider]  rosuvastatin (CRESTOR) 10 MG tablet Take 1 tablet (10 mg total) by mouth daily. 07/05/21   Corky Crafts, MD  telmisartan (MICARDIS) 80 MG tablet TAKE 1 TABLET BY MOUTH EVERY DAY 09/06/21   Corky Crafts, MD  Vitamin D, Ergocalciferol, (DRISDOL) 1.25 MG (50000 UNIT) CAPS capsule Take 50,000 Units by mouth once a week. 05/25/20   [provider]      Allergies    Aspirin, Clarithromycin, Doxycycline, Levaquin [levofloxacin in d5w], Penicillins, and Pravastatin    Review of Systems   Review of Systems  All other systems reviewed and are negative.   Physical Exam Updated Vital Signs BP (!) 169/87   Pulse 62   Temp 98.5 F (36.9 C) (Oral)   Resp 18  Ht 5' (1.524 m)   Wt 78 kg   SpO2 100%   BMI 33.58 kg/m  Physical Exam Vitals and nursing note reviewed.   86 year old female, resting comfortably and in no acute distress. Vital signs are significant for elevated blood pressure. Oxygen saturation is 100%, which is normal. Head is normocephalic and atraumatic. PERRLA, EOMI. Oropharynx is clear. Neck is nontender and supple without adenopathy or JVD. Back is nontender and there is no CVA tenderness. Lungs are clear without rales, wheezes, or rhonchi. Chest is nontender. Heart has regular rate and rhythm without murmur. Abdomen  is soft, flat, nontender. Extremities have no cyanosis or edema, full range of motion is present. Skin is warm and dry without rash. Neurologic: Mental status is normal, cranial nerves are intact, moves all extremities equally.  ED Results / Procedures / Treatments   Labs (all labs ordered are listed, but only abnormal results are displayed) Labs Reviewed  BASIC METABOLIC PANEL  CBC WITH DIFFERENTIAL/PLATELET  TROPONIN I (HIGH SENSITIVITY)    EKG EKG Interpretation  Date/Time:  Thursday November 25 2021 22:05:51 EDT Ventricular Rate:  61 PR Interval:  284 QRS Duration: 91 QT Interval:  378 QTC Calculation: 381 R Axis:   -22 Text Interpretation: Sinus rhythm Prolonged PR interval Inferoposterior infarct, old When compared with ECG of 05/29/2020, No significant change was found Reconfirmed by Dione Booze ([id number]) on 11/25/2021 11:25:55 PM  Radiology No results found.  Procedures Procedures  Cardiac monitor shows normal sinus rhythm with occasional PVC, per my interpretation.  Medications Ordered in ED Medications - No data to display  ED Course/ Medical Decision Making/ A&P                           Medical Decision Making Amount and/or Complexity of Data Reviewed Labs: ordered. Radiology: ordered.   Chest pain of uncertain cause.  Differential includes ACS, GERD, aortic dissection, musculoskeletal pain.  I have independently reviewed and interpreted the ECG, and my interpretation is old inferior wall myocardial infarction without any acute changes and unchanged from prior ECG.  Patient is intolerant of aspirin, so aspirin is not given.  She is pain-free currently, I have ordered laboratory work-up including CBC, basic metabolic panel, troponin x2.  I have also ordered a chest x-ray to look for evidence of heart failure or pneumonia.  Old records are reviewed, and she was seen by geriatric medicine on 6/26 2023 and did give her new prescription for escitalopram at that time.  I  reviewed her prescriptions, and a prescription for isosorbide was written and filled 1 month ago.    Final Clinical Impression(s) / ED Diagnoses Final diagnoses:  None    Rx / DC Orders ED Discharge Orders     None

## 2021-11-26 ENCOUNTER — Encounter (HOSPITAL_COMMUNITY): Payer: Self-pay | Admitting: Family Medicine

## 2021-11-26 ENCOUNTER — Observation Stay (HOSPITAL_BASED_OUTPATIENT_CLINIC_OR_DEPARTMENT_OTHER): Payer: PPO

## 2021-11-26 DIAGNOSIS — N1832 Chronic kidney disease, stage 3b: Secondary | ICD-10-CM

## 2021-11-26 DIAGNOSIS — I5032 Chronic diastolic (congestive) heart failure: Secondary | ICD-10-CM | POA: Diagnosis not present

## 2021-11-26 DIAGNOSIS — I10 Essential (primary) hypertension: Secondary | ICD-10-CM

## 2021-11-26 DIAGNOSIS — R079 Chest pain, unspecified: Secondary | ICD-10-CM

## 2021-11-26 DIAGNOSIS — R778 Other specified abnormalities of plasma proteins: Secondary | ICD-10-CM | POA: Diagnosis not present

## 2021-11-26 DIAGNOSIS — I2510 Atherosclerotic heart disease of native coronary artery without angina pectoris: Secondary | ICD-10-CM | POA: Diagnosis not present

## 2021-11-26 DIAGNOSIS — I517 Cardiomegaly: Secondary | ICD-10-CM

## 2021-11-26 DIAGNOSIS — R7989 Other specified abnormal findings of blood chemistry: Secondary | ICD-10-CM | POA: Diagnosis present

## 2021-11-26 DIAGNOSIS — E785 Hyperlipidemia, unspecified: Secondary | ICD-10-CM

## 2021-11-26 LAB — CBC WITH DIFFERENTIAL/PLATELET
Abs Immature Granulocytes: 0.03 10*3/uL (ref 0.00–0.07)
Basophils Absolute: 0 10*3/uL (ref 0.0–0.1)
Basophils Relative: 0 %
Eosinophils Absolute: 0.2 10*3/uL (ref 0.0–0.5)
Eosinophils Relative: 2 %
HCT: 32.7 % — ABNORMAL LOW (ref 36.0–46.0)
Hemoglobin: 10.8 g/dL — ABNORMAL LOW (ref 12.0–15.0)
Immature Granulocytes: 0 %
Lymphocytes Relative: 21 %
Lymphs Abs: 1.6 10*3/uL (ref 0.7–4.0)
MCH: 30.7 pg (ref 26.0–34.0)
MCHC: 33 g/dL (ref 30.0–36.0)
MCV: 92.9 fL (ref 80.0–100.0)
Monocytes Absolute: 0.7 10*3/uL (ref 0.1–1.0)
Monocytes Relative: 8 %
Neutro Abs: 5.3 10*3/uL (ref 1.7–7.7)
Neutrophils Relative %: 69 %
Platelets: 178 10*3/uL (ref 150–400)
RBC: 3.52 MIL/uL — ABNORMAL LOW (ref 3.87–5.11)
RDW: 13.5 % (ref 11.5–15.5)
WBC: 7.8 10*3/uL (ref 4.0–10.5)
nRBC: 0 % (ref 0.0–0.2)

## 2021-11-26 LAB — BASIC METABOLIC PANEL
Anion gap: 8 (ref 5–15)
BUN: 19 mg/dL (ref 8–23)
CO2: 21 mmol/L — ABNORMAL LOW (ref 22–32)
Calcium: 8.8 mg/dL — ABNORMAL LOW (ref 8.9–10.3)
Chloride: 112 mmol/L — ABNORMAL HIGH (ref 98–111)
Creatinine, Ser: 1.41 mg/dL — ABNORMAL HIGH (ref 0.44–1.00)
GFR, Estimated: 37 mL/min — ABNORMAL LOW (ref >60)
Glucose, Bld: 122 mg/dL — ABNORMAL HIGH (ref 70–99)
Potassium: 4 mmol/L (ref 3.5–5.1)
Sodium: 141 mmol/L (ref 135–145)

## 2021-11-26 LAB — LIPID PANEL
Cholesterol: 146 mg/dL (ref 0–200)
HDL: 44 mg/dL (ref >40)
LDL Cholesterol: 95 mg/dL (ref 0–99)
Total CHOL/HDL Ratio: 3.3 RATIO
Triglycerides: 34 mg/dL (ref <150)
VLDL: 7 mg/dL (ref 0–40)

## 2021-11-26 LAB — ECHOCARDIOGRAM COMPLETE
AR max vel: 0.96 cm2
AV Area VTI: 0.96 cm2
AV Area mean vel: 0.88 cm2
AV Mean grad: 17.4 mmHg
AV Peak grad: 31.6 mmHg
Ao pk vel: 2.81 m/s
Area-P 1/2: 4.06 cm2
Calc EF: 54.8 %
Height: 60 in
MV M vel: 6.19 m/s
MV Peak grad: 153.3 mmHg
MV VTI: 1.5 cm2
Radius: 0.4 cm
S' Lateral: 3.7 cm
Single Plane A2C EF: 54 %
Single Plane A4C EF: 52 %
Weight: 2694.9 oz

## 2021-11-26 LAB — HEMOGLOBIN A1C
Hgb A1c MFr Bld: 5.9 % — ABNORMAL HIGH (ref 4.8–5.6)
Mean Plasma Glucose: 122.63 mg/dL

## 2021-11-26 LAB — TROPONIN I (HIGH SENSITIVITY)
Troponin I (High Sensitivity): 32 ng/L — ABNORMAL HIGH (ref <18)
Troponin I (High Sensitivity): 43 ng/L — ABNORMAL HIGH (ref <18)
Troponin I (High Sensitivity): 56 ng/L — ABNORMAL HIGH (ref <18)
Troponin I (High Sensitivity): 58 ng/L — ABNORMAL HIGH (ref <18)

## 2021-11-26 LAB — HEPARIN LEVEL (UNFRACTIONATED): Heparin Unfractionated: 0.11 IU/mL — ABNORMAL LOW (ref 0.30–0.70)

## 2021-11-26 MED ORDER — SENNOSIDES-DOCUSATE SODIUM 8.6-50 MG PO TABS: 1.0000 | ORAL_TABLET | Freq: Every evening | ORAL | Status: DC | PRN

## 2021-11-26 MED ORDER — ENOXAPARIN SODIUM 40 MG/0.4ML IJ SOSY
40.0000 mg | PREFILLED_SYRINGE | INTRAMUSCULAR | Status: DC
  Filled 2021-11-26: qty 0.4

## 2021-11-26 MED ORDER — ENOXAPARIN SODIUM 30 MG/0.3ML IJ SOSY
30.0000 mg | PREFILLED_SYRINGE | INTRAMUSCULAR | Status: DC
Start: 2021-11-27 — End: 2021-11-26

## 2021-11-26 MED ORDER — MOMETASONE FURO-FORMOTEROL FUM 200-5 MCG/ACT IN AERO
2.0000 | INHALATION_SPRAY | Freq: Two times a day (BID) | RESPIRATORY_TRACT | Status: DC
  Filled 2021-11-26: qty 8.8

## 2021-11-26 MED ORDER — ONDANSETRON HCL 4 MG/2ML IJ SOLN
4.0000 mg | Freq: Once | INTRAMUSCULAR | Status: AC
  Administered 2021-11-26: 4 mg via INTRAVENOUS
  Filled 2021-11-26: qty 2

## 2021-11-26 MED ORDER — HEPARIN BOLUS VIA INFUSION
3000.0000 [IU] | Freq: Once | INTRAVENOUS | Status: AC
  Administered 2021-11-26: 3000 [IU] via INTRAVENOUS
  Filled 2021-11-26: qty 3000

## 2021-11-26 MED ORDER — ALUM & MAG HYDROXIDE-SIMETH 200-200-20 MG/5ML PO SUSP
30.0000 mL | Freq: Once | ORAL | Status: AC
  Administered 2021-11-26: 30 mL via ORAL
  Filled 2021-11-26: qty 30

## 2021-11-26 MED ORDER — HEPARIN (PORCINE) 25000 UT/250ML-% IV SOLN
800.0000 [IU]/h | INTRAVENOUS | Status: DC
  Administered 2021-11-26: 800 [IU]/h via INTRAVENOUS
  Filled 2021-11-26: qty 250

## 2021-11-26 MED ORDER — METOPROLOL TARTRATE 5 MG/5ML IV SOLN: 5.0000 mg | INTRAVENOUS | Status: DC | PRN

## 2021-11-26 MED ORDER — ACETAMINOPHEN 325 MG PO TABS: 650.0000 mg | ORAL_TABLET | ORAL | Status: DC | PRN

## 2021-11-26 MED ORDER — GUAIFENESIN 100 MG/5ML PO LIQD: 5.0000 mL | ORAL | Status: DC | PRN

## 2021-11-26 MED ORDER — IPRATROPIUM-ALBUTEROL 0.5-2.5 (3) MG/3ML IN SOLN: 3.0000 mL | RESPIRATORY_TRACT | Status: DC | PRN

## 2021-11-26 MED ORDER — ONDANSETRON HCL 4 MG/2ML IJ SOLN: 4.0000 mg | Freq: Four times a day (QID) | INTRAMUSCULAR | Status: DC | PRN

## 2021-11-26 MED ORDER — PANTOPRAZOLE SODIUM 40 MG PO TBEC
40.0000 mg | DELAYED_RELEASE_TABLET | Freq: Every day | ORAL | Status: DC
  Administered 2021-11-26: 40 mg via ORAL
  Filled 2021-11-26: qty 1

## 2021-11-26 MED ORDER — TRAZODONE HCL 50 MG PO TABS: 50.0000 mg | ORAL_TABLET | Freq: Every evening | ORAL | Status: DC | PRN

## 2021-11-26 MED ORDER — METOPROLOL SUCCINATE ER 25 MG PO TB24
25.0000 mg | ORAL_TABLET | Freq: Two times a day (BID) | ORAL | Status: DC
  Administered 2021-11-26: 25 mg via ORAL
  Filled 2021-11-26: qty 1

## 2021-11-26 MED ORDER — ALBUTEROL SULFATE (2.5 MG/3ML) 0.083% IN NEBU: 2.5000 mg | INHALATION_SOLUTION | Freq: Four times a day (QID) | RESPIRATORY_TRACT | Status: DC | PRN

## 2021-11-26 MED ORDER — HYDRALAZINE HCL 20 MG/ML IJ SOLN: 10.0000 mg | INTRAMUSCULAR | Status: DC | PRN

## 2021-11-26 MED ORDER — ROSUVASTATIN CALCIUM 5 MG PO TABS
10.0000 mg | ORAL_TABLET | Freq: Every day | ORAL | Status: DC
  Administered 2021-11-26: 10 mg via ORAL
  Filled 2021-11-26: qty 2

## 2021-11-26 MED ORDER — NITROGLYCERIN 0.4 MG SL SUBL
0.4000 mg | SUBLINGUAL_TABLET | SUBLINGUAL | Status: DC | PRN
Start: 2021-11-26 — End: 2021-11-26
  Administered 2021-11-26: 0.4 mg via SUBLINGUAL
  Filled 2021-11-26: qty 1

## 2021-11-26 MED ORDER — ASPIRIN 81 MG PO CHEW
324.0000 mg | CHEWABLE_TABLET | Freq: Once | ORAL | Status: AC
  Administered 2021-11-26: 324 mg via ORAL
  Filled 2021-11-26: qty 4

## 2021-11-26 MED ORDER — CLOPIDOGREL BISULFATE 75 MG PO TABS
75.0000 mg | ORAL_TABLET | Freq: Every day | ORAL | Status: DC
  Administered 2021-11-26: 75 mg via ORAL
  Filled 2021-11-26: qty 1

## 2021-11-26 MED ORDER — ESCITALOPRAM OXALATE 10 MG PO TABS
5.0000 mg | ORAL_TABLET | Freq: Every day | ORAL | Status: DC
  Administered 2021-11-26: 5 mg via ORAL
  Filled 2021-11-26: qty 1

## 2021-11-26 NOTE — Progress Notes (Signed)
86 year old with history of CAD, OSA on CPAP, CHF with preserved EF, HLD, HTN comes to the hospital complains of chest discomfort.  Patient states she has been under a lot of stress recently.  Upon admission troponins were mildly elevated but flat.  EKG did not show any acute changes.  Initially started on heparin drip but eventually after cardiology consultation was determined this was not ACS therefore taken off heparin drip.  Echocardiogram was ordered.  When I saw the patient at bedside she was comfortable and did not have any chest pain.  Vital signs are stable.  Clear to auscultation bilaterally, abdomen is nontender nondistended.  Normal sinus rhythm.  At this time cardiology team is awaiting echocardiogram to make further determination. Echocardiogram not ordered during admission therefore I went ahead and ordered it just now with anticipated discharge. I will also order A1c, lipid panel.  Rest of the management as planned earlier.  Call with any questions as needed.  Stephania Fragmin MD [facility]

## 2021-11-30 DIAGNOSIS — K5904 Chronic idiopathic constipation: Secondary | ICD-10-CM | POA: Diagnosis not present

## 2021-11-30 DIAGNOSIS — F419 Anxiety disorder, unspecified: Secondary | ICD-10-CM | POA: Diagnosis not present

## 2021-11-30 DIAGNOSIS — K219 Gastro-esophageal reflux disease without esophagitis: Secondary | ICD-10-CM | POA: Diagnosis not present

## 2021-11-30 DIAGNOSIS — R1013 Epigastric pain: Secondary | ICD-10-CM | POA: Diagnosis not present

## 2021-11-30 DIAGNOSIS — E669 Obesity, unspecified: Secondary | ICD-10-CM | POA: Diagnosis not present

## 2021-11-30 DIAGNOSIS — G4733 Obstructive sleep apnea (adult) (pediatric): Secondary | ICD-10-CM | POA: Diagnosis not present

## 2021-11-30 DIAGNOSIS — K5730 Diverticulosis of large intestine without perforation or abscess without bleeding: Secondary | ICD-10-CM | POA: Diagnosis not present

## 2021-11-30 DIAGNOSIS — I10 Essential (primary) hypertension: Secondary | ICD-10-CM | POA: Diagnosis not present

## 2021-11-30 DIAGNOSIS — M0600 Rheumatoid arthritis without rheumatoid factor, unspecified site: Secondary | ICD-10-CM | POA: Diagnosis not present

## 2021-11-30 DIAGNOSIS — E785 Hyperlipidemia, unspecified: Secondary | ICD-10-CM | POA: Diagnosis not present

## 2021-11-30 DIAGNOSIS — I2510 Atherosclerotic heart disease of native coronary artery without angina pectoris: Secondary | ICD-10-CM | POA: Diagnosis not present

## 2021-12-01 DIAGNOSIS — F439 Reaction to severe stress, unspecified: Secondary | ICD-10-CM | POA: Diagnosis not present

## 2021-12-01 DIAGNOSIS — D509 Iron deficiency anemia, unspecified: Secondary | ICD-10-CM | POA: Diagnosis not present

## 2021-12-01 DIAGNOSIS — G4733 Obstructive sleep apnea (adult) (pediatric): Secondary | ICD-10-CM | POA: Diagnosis not present

## 2021-12-01 DIAGNOSIS — J449 Chronic obstructive pulmonary disease, unspecified: Secondary | ICD-10-CM | POA: Diagnosis not present

## 2021-12-01 DIAGNOSIS — K219 Gastro-esophageal reflux disease without esophagitis: Secondary | ICD-10-CM | POA: Diagnosis not present

## 2021-12-01 DIAGNOSIS — R140 Abdominal distension (gaseous): Secondary | ICD-10-CM | POA: Diagnosis not present

## 2021-12-01 DIAGNOSIS — R0789 Other chest pain: Secondary | ICD-10-CM | POA: Diagnosis not present

## 2021-12-01 DIAGNOSIS — E785 Hyperlipidemia, unspecified: Secondary | ICD-10-CM | POA: Diagnosis not present

## 2021-12-01 DIAGNOSIS — M069 Rheumatoid arthritis, unspecified: Secondary | ICD-10-CM | POA: Diagnosis not present

## 2021-12-01 DIAGNOSIS — I2510 Atherosclerotic heart disease of native coronary artery without angina pectoris: Secondary | ICD-10-CM | POA: Diagnosis not present

## 2021-12-03 DIAGNOSIS — J449 Chronic obstructive pulmonary disease, unspecified: Secondary | ICD-10-CM | POA: Diagnosis not present

## 2021-12-03 DIAGNOSIS — E785 Hyperlipidemia, unspecified: Secondary | ICD-10-CM | POA: Diagnosis not present

## 2021-12-03 DIAGNOSIS — I10 Essential (primary) hypertension: Secondary | ICD-10-CM | POA: Diagnosis not present

## 2021-12-03 DIAGNOSIS — I2510 Atherosclerotic heart disease of native coronary artery without angina pectoris: Secondary | ICD-10-CM | POA: Diagnosis not present
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
## 2021-12-09 ENCOUNTER — Ambulatory Visit (INDEPENDENT_AMBULATORY_CARE_PROVIDER_SITE_OTHER): Payer: PPO | Admitting: Physician Assistant

## 2021-12-09 ENCOUNTER — Encounter: Payer: Self-pay | Admitting: Physician Assistant

## 2021-12-09 VITALS — BP 126/78 | HR 76 | Ht 60.0 in | Wt 166.0 lb

## 2021-12-09 DIAGNOSIS — E669 Obesity, unspecified: Secondary | ICD-10-CM

## 2021-12-09 DIAGNOSIS — I350 Nonrheumatic aortic (valve) stenosis: Secondary | ICD-10-CM | POA: Diagnosis not present

## 2021-12-09 DIAGNOSIS — R0609 Other forms of dyspnea: Secondary | ICD-10-CM

## 2021-12-09 DIAGNOSIS — I340 Nonrheumatic mitral (valve) insufficiency: Secondary | ICD-10-CM | POA: Diagnosis not present

## 2021-12-09 DIAGNOSIS — E785 Hyperlipidemia, unspecified: Secondary | ICD-10-CM | POA: Diagnosis not present

## 2021-12-09 DIAGNOSIS — Z79899 Other long term (current) drug therapy: Secondary | ICD-10-CM

## 2021-12-09 DIAGNOSIS — I25118 Atherosclerotic heart disease of native coronary artery with other forms of angina pectoris: Secondary | ICD-10-CM | POA: Diagnosis not present

## 2021-12-09 DIAGNOSIS — I10 Essential (primary) hypertension: Secondary | ICD-10-CM | POA: Diagnosis not present

## 2021-12-09 MED ORDER — ISOSORBIDE MONONITRATE ER 30 MG PO TB24: 15.0000 mg | ORAL_TABLET | Freq: Every day | ORAL | 3 refills | Status: DC

## 2021-12-09 NOTE — Patient Instructions (Signed)
Medication Instructions:  Your physician has recommended you make the following change in your medication:  1.Decrease isosorbide mononitrate (Imdur) to 15 mg daily. This will be one-half of a 30 mg tablet. *If you need a refill on your cardiac medications before your next appointment, please call your pharmacy*   Lab Work: None If you have labs (blood work) drawn today and your tests are completely normal, you will receive your results only by: MyChart Message (if you have MyChart) OR A paper copy in the mail If you have any lab test that is abnormal or we need to change your treatment, we will call you to review the results.   Follow-Up: At [facility], you and your health needs are our priority.  As part of our continuing mission to provide you with exceptional heart care, we have created designated Provider Care Teams.  These Care Teams include your primary Cardiologist (physician) and Advanced Practice Providers (APPs -  Physician Assistants and Nurse Practitioners) who all work together to provide you with the care you need, when you need it.   Your next appointment:   1-2 months  The format for your next appointment:   In Person  Provider:   Lance Muss, MD {  Other Instructions You have been referred to see our pharmacists to discuss medication management.   Important Information About Sugar

## 2021-12-13 ENCOUNTER — Ambulatory Visit
Admission: RE | Admit: 2021-12-13 | Discharge: 2021-12-13 | Disposition: A | Discharge status: Home / Self Care | Payer: PPO | Source: Ambulatory Visit | Attending: Internal Medicine | Admitting: Internal Medicine

## 2021-12-13 DIAGNOSIS — Z1231 Encounter for screening mammogram for malignant neoplasm of breast: Secondary | ICD-10-CM

## 2021-12-24 DIAGNOSIS — M545 Low back pain, unspecified: Secondary | ICD-10-CM | POA: Diagnosis not present

## 2022-01-03 ENCOUNTER — Ambulatory Visit: Payer: PPO | Admitting: Pharmacist

## 2022-01-03 DIAGNOSIS — K219 Gastro-esophageal reflux disease without esophagitis: Secondary | ICD-10-CM | POA: Diagnosis not present

## 2022-01-03 DIAGNOSIS — I10 Essential (primary) hypertension: Secondary | ICD-10-CM

## 2022-01-03 DIAGNOSIS — E785 Hyperlipidemia, unspecified: Secondary | ICD-10-CM | POA: Diagnosis not present

## 2022-01-03 DIAGNOSIS — F411 Generalized anxiety disorder: Secondary | ICD-10-CM

## 2022-01-03 NOTE — Progress Notes (Unsigned)
Patient ID: [name] [name]                 DOB: Jun 28, 1935                    MRN: [id number]     HPI: [name] [name] is a 86 y.o. female patient of Dr. Eldridge Dace referred to lipid clinic by Jari Favre, PA. PMH is significant for multivessel CAD on cardiac catheterization 2009 managed medically due to poor PCI and CABG targets, hypertension, hyperlipidemia, OSA not on CPAP, PACs, PVCs, and mild aortic stenosis. Patient was seen by Julian Hy on 12/09/21 for post hospital follow up. There was confusion as to what medications patient was taking. 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  In the past it was noted that she blames the medicine she is taking for anxiety, nausea, and fatigue.  She does not like taking 5 to 6 pills/day and feels like it is too much medicine.  Patient presents today to clinic. She was late to the appointment. When I brought her back she complained on SOB, chest discomfort and was belching very frequently. She complains of itching and wants to know what is causing the sensation of pressure in the center of her chest that is relieved with belching. After being in the office for awhile her belching slowing down significantly.  History was a little hard to follow but she states she was started on lexapro when she was in the hospital. She was previously on telmisartan. Her pharmacy said her doctor wouldn't refill it. Wants to know if she is supposed to be on it. She has been given both pantoprazole and famotidine. She says she will be fine all day and then when she lays down to sleep it will awake her 1 hr later. She has been trying to prop herself up.  She has been taking 15mg  of isosorbide. States she is out of nitroglycerin. Has refills at pharmacy. Was taking up to 5 a day. Did have headache with isosorbide but it did go away.  Past Medical History:  Diagnosis Date   Aortic stenosis    mild by echo in 03/2020   Arthritis     CAD (coronary artery disease)    a. 02/2008 Cath: EF 60%, RCA 100 w/ L->R collats, LM 20d, RI 60-70, small, LAD 60/24m, 99d, D1 50, OM1 50. Poor distal targets for CABG/no good interventional target -> medical Rx; b. Myoview in [place] in 3/11 -nl;  c. 07/2011 MV: EF 67%, mild inf ischemia->Med Rx; 02/2015 MV: EF 50%, small, mod intensity apical and apical inf rev defect-->Med Rx.   Chronic cough    hx; ACEI stopped, possible contribution from GERD   Chronic diastolic CHF (congestive heart failure) (HCC)    Echocardiogram 10/21: EF 60-65, no RWMA, mild LVH, Gr 2 DD, normal RVSF, severe LAE, small pericardial effusion, trivial MR, mild AS (mean 11 mmHg)   Depression    Dyspnea    a. 08/2013 Echo: EF 60-65%, mild AS;  b. PFTs (10/10): FVC 103%, ratio 74%. mild obstruction; c. CT chest (6/11) no evidence for interstitial lung dz. possible asthma/upper airway cough sydrome. aspirin & beta blockers- potential causes for bronchospasm;   GERD (gastroesophageal reflux disease)    HBV (hepatitis B virus) infection    HCV (hepatitis C virus)    History of PAC's    hx   History of PVC's  hx   HLD (hyperlipidemia)    unable to tolerate most statins due to myalgias and elevated CK. thinks pravastatin casued a rash. Only tolerates crestor.   Hypertensive heart disease    IBS (irritable bowel syndrome)    Obesity    Osteoarthritis    knee; s/p TKR   Osteoporosis    Pancreatitis    hx   Pericardial effusion    small by echo in 03/2020 // Limited Echocardiogram 1/22: EF 60-65, no RWMA, mod LVH, Gr 1 DD, normal RVSF, mild LAE, mild MR, mild AS mean 8.5 mmHg), no pericardial effusion      PUD (peptic ulcer disease)    Sleep apnea    diagnosed in 2011 , did not want CPAP -     Current Outpatient Medications on File Prior to Visit  Medication Sig Dispense Refill   acetaminophen (TYLENOL) 500 MG tablet Take 1,000 mg by mouth every 6 (six) hours as needed for moderate pain or headache.     albuterol  (PROVENTIL) (2.5 MG/3ML) 0.083% nebulizer solution Take 3 mLs (2.5 mg total) by nebulization every 6 (six) hours as needed for wheezing or shortness of breath. 270 mL 0   amLODipine (NORVASC) 5 MG tablet Take 1 tablet (5 mg total) by mouth daily. 90 tablet 3   budesonide-formoterol (SYMBICORT) 160-4.5 MCG/ACT inhaler Inhale 2 puffs into the lungs 2 (two) times daily as needed. (Patient taking differently: Inhale 2 puffs into the lungs 2 (two) times daily as needed (shortness of breath).) 1 each 0   clopidogrel (PLAVIX) 75 MG tablet TAKE 1 TABLET BY MOUTH EVERY DAY (Patient taking differently: Take 75 mg by mouth daily.) 90 tablet 2   escitalopram (LEXAPRO) 5 MG tablet Take 5 mg by mouth daily.     ezetimibe (ZETIA) 10 MG tablet Take 10 mg by mouth daily.     famotidine (PEPCID) 20 MG tablet Take 20 mg by mouth daily.     furosemide (LASIX) 40 MG tablet TAKE 1 TABLET BY MOUTH EVERY DAY (Patient taking differently: Take 40 mg by mouth daily.) 90 tablet 3   isosorbide mononitrate (IMDUR) 30 MG 24 hr tablet Take 0.5 tablets (15 mg total) by mouth daily. 45 tablet 3   KLOR-CON M20 20 MEQ tablet TAKE 1 TABLET BY MOUTH EVERY DAY (Patient taking differently: Take 20 mEq by mouth every other day.) 90 tablet 3   LINZESS 145 MCG CAPS capsule Take 145 mcg by mouth as needed (constipation).     nitroGLYCERIN (NITROSTAT) 0.4 MG SL tablet PLACE 1 TABLET UNDER THE TONGUE EVERY 5 MINUTES AS NEEDED. (Patient taking differently: Place 0.4 mg under the tongue every 5 (five) minutes as needed for chest pain.) 75 tablet 1   pantoprazole (PROTONIX) 40 MG tablet Take 40 mg by mouth daily.     Vitamin D, Ergocalciferol, (DRISDOL) 1.25 MG (50000 UNIT) CAPS capsule Take 50,000 Units by mouth every Sunday.     No current facility-administered medications on file prior to visit.    Allergies  Allergen Reactions   Aspirin Nausea Only   Clarithromycin Other (See Comments)    unknown   Doxycycline Nausea Only   Levaquin  [Levofloxacin In D5w] Rash    Rash, HA   Penicillins Itching and Rash    Other reaction(s): Unknown Has patient had a PCN reaction causing immediate rash, facial/tongue/throat swelling, SOB or lightheadedness with hypotension: No Has patient had a PCN reaction causing severe rash involving mucus membranes or skin necrosis: No Has  patient had a PCN reaction that required hospitalization No Has patient had a PCN reaction occurring within the last 10 years: No If all of the above answers are "NO", then may proceed with Cephalosporin use.    Pravastatin Itching    REACTION: itching    Assessment/Plan:  1. - Chest discomfort- Cardiac causes have been ruled out while in hospital. Sounds much more like GERD symptoms to me, especially since relieved by belching. I think this could be anxiety induced. When she was worked up at the beginning of the appointment, her belching was no stop, but once she calmed down it was significantly less frequent. I have encouraged her to see her GI doctor or at least speak to her primary care doctor about increasing or changing medications.  She asks about the dose of her isosorbide and headaches. I advised that this medication can cause headaches, but like she experienced, they do tend to go away once you are use to the medication. Can take APAP. She is seeing Dr. Eldridge Dace tomorrow and I will defer any changes to him.  2. Anxiety-  I encouraged patient to practice deep breathing techniques to help with anxiety. I also encouraged her to talk with PCP about seeing a phycologist. Lexapro per PCP. I think a lot of her GI issues are being triggered by her anxiety.  3. Blood pressure- She reports blood pressure at home 112/78. Advised she does not need telmisartan. Continue amlodipine 5mg  daily.  4. Increased urination- Patient reports frequent trips to the bathroom, even prior to using furosemide. She said her PCP told her she has a weak bladder. Has to wear protection due  to leakage. I encouraged her to see urologist or consider pelvic floor physical therapy. She had been taking her furosemide at night time. I advised she take in the AM as to not interrupt sleep. Patient reports poor sleep.  5. Medication management- I reviewed with patient what each medication is for and I also wrote on her medication list what each medication is for.   Thank you,   , Pharm.D, BCPS, CPP [facility]  [address], [place], [place]  Phone: [phone]; Fax: [phone]

## 2022-01-03 NOTE — Patient Instructions (Addendum)
Let your primary care doctor know that the famotidine and pantoprazole is not working well. See if you can increase dose of famotidine. Talk to your primary care doctor about seeing a urologist for weak pelvic floor/leakage increase urination Would also recommend you see your GI doctor about your acid reflux  Try breathing exercises- deep breath in for 4 seconds, slow exhale for 4 seconds Look into mindfulness

## 2022-01-03 NOTE — Progress Notes (Unsigned)
Cardiology Office Note   Date:  01/04/2022   ID:  [name] [name], DOB [birth year], MRN [id number]  PCP:  Jarome Matin (Inactive)    No chief complaint on file.  CAD  Wt Readings from Last 3 Encounters:  01/04/22 165 lb (74.8 kg)  12/09/21 166 lb (75.3 kg)  11/26/21 168 lb 6.9 oz (76.4 kg)       History of Present Illness: [name] [name] is a 86 y.o. female  with history of multivessel CAD on cath in 2009 managed medically due to poor PCI and CABG targets.  Also has hypertension, HLD, OSA not on CPAP, PACs, PVCs, mild aortic stenosis.   She was seen by Dr. Okey Dupre in the past.  Myalgias have been an issue on statins.  She has been on and off statins.  It was noted in 10/19: " She was having myalgias and fatigue was concerned it was secondary to statins.  He agreed to 1 month off of statins if her symptoms improve refer for PCSK9 inhibitor therapy.  LDL was at 80 and not at goal.  If her symptoms do not improve with 1 month off he recommended increasing her rosuvastatin to 40 mg daily."   She did not start ezetemibe.     She tried bempedoic acid in 2020.  She felt that she could not walk well and had anxiety attacks with the new medicine.    In the past, it was noted: "She blames the medicines she takes for anxiety, nausea and fatigue.  She does not like taking 5-6 pills per day and feels this is a "whole lot of medicine."   She reported some SHOB in 9/21.   She had COVID in 12/21 and had an antibody infusion.   Husband has dementia so she has a lot of stress at home.  Son lives near by but he has back issues. Stress is a trigger for some SHOB and palpitations.  She has tried SL NTG.   She has difficulty resting when he gets confused at night.  She was seen in early July 2023.  Imdur was decreased to 15 mg daily.  She had only atypical chest pain at that time.  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  Echocardiogram was ordered  showing: "Normal LV/RV function.  Mild to moderate mitral regurgitation, moderate aortic stenosis."  Headaches improved on less Imdur.   Stress is increased at home with her husbands memory issues.   Past Medical History:  Diagnosis Date   Aortic stenosis    mild by echo in 03/2020   Arthritis    CAD (coronary artery disease)    a. 02/2008 Cath: EF 60%, RCA 100 w/ L->R collats, LM 20d, RI 60-70, small, LAD 60/83m, 99d, D1 50, OM1 50. Poor distal targets for CABG/no good interventional target -> medical Rx; b. Myoview in [place] in 3/11 -nl;  c. 07/2011 MV: EF 67%, mild inf ischemia->Med Rx; 02/2015 MV: EF 50%, small, mod intensity apical and apical inf rev defect-->Med Rx.   Chronic cough    hx; ACEI stopped, possible contribution from GERD   Chronic diastolic CHF (congestive heart failure) (HCC)    Echocardiogram 10/21: EF 60-65, no RWMA, mild LVH, Gr 2 DD, normal RVSF, severe LAE, small pericardial effusion, trivial MR, mild AS (mean 11 mmHg)   Depression    Dyspnea    a. 08/2013 Echo: EF 60-65%, mild AS;  b. PFTs (  10/10): FVC 103%, ratio 74%. mild obstruction; c. CT chest (6/11) no evidence for interstitial lung dz. possible asthma/upper airway cough sydrome. aspirin & beta blockers- potential causes for bronchospasm;   GERD (gastroesophageal reflux disease)    HBV (hepatitis B virus) infection    HCV (hepatitis C virus)    History of PAC's    hx   History of PVC's    hx   HLD (hyperlipidemia)    unable to tolerate most statins due to myalgias and elevated CK. thinks pravastatin casued a rash. Only tolerates crestor.   Hypertensive heart disease    IBS (irritable bowel syndrome)    Obesity    Osteoarthritis    knee; s/p TKR   Osteoporosis    Pancreatitis    hx   Pericardial effusion    small by echo in 03/2020 // Limited Echocardiogram 1/22: EF 60-65, no RWMA, mod LVH, Gr 1 DD, normal RVSF, mild LAE, mild MR, mild AS mean 8.5 mmHg), no pericardial effusion      PUD (peptic ulcer  disease)    Sleep apnea    diagnosed in 2011 , did not want CPAP -     Past Surgical History:  Procedure Laterality Date   BREAST EXCISIONAL BIOPSY Right 1990   calcification of right breast-lumps Right 1998   CARDIAC CATHETERIZATION  2009   CHOLECYSTECTOMY     hyesterectomy, unspec. area  1998   pancreatic duct stent     TUBAL LIGATION  1995      Current Outpatient Medications  Medication Sig Dispense Refill   acetaminophen (TYLENOL) 500 MG tablet Take 1,000 mg by mouth every 6 (six) hours as needed for moderate pain or headache.     albuterol (PROVENTIL) (2.5 MG/3ML) 0.083% nebulizer solution Take 3 mLs (2.5 mg total) by nebulization every 6 (six) hours as needed for wheezing or shortness of breath. 270 mL 0   amLODipine (NORVASC) 5 MG tablet Take 1 tablet (5 mg total) by mouth daily. 90 tablet 3   budesonide-formoterol (SYMBICORT) 160-4.5 MCG/ACT inhaler Inhale 2 puffs into the lungs 2 (two) times daily as needed. (Patient taking differently: Inhale 2 puffs into the lungs 2 (two) times daily as needed (shortness of breath).) 1 each 0   clopidogrel (PLAVIX) 75 MG tablet TAKE 1 TABLET BY MOUTH EVERY DAY 90 tablet 2   escitalopram (LEXAPRO) 5 MG tablet Take 5 mg by mouth daily.     ezetimibe (ZETIA) 10 MG tablet Take 10 mg by mouth daily.     famotidine (PEPCID) 20 MG tablet Take 20 mg by mouth daily.     furosemide (LASIX) 40 MG tablet Take 40 mg by mouth every other day.     isosorbide mononitrate (IMDUR) 30 MG 24 hr tablet Take 0.5 tablets (15 mg total) by mouth daily. 45 tablet 3   LINZESS 145 MCG CAPS capsule Take 145 mcg by mouth as needed (constipation).     nitroGLYCERIN (NITROSTAT) 0.4 MG SL tablet PLACE 1 TABLET UNDER THE TONGUE EVERY 5 MINUTES AS NEEDED. 75 tablet 1   pantoprazole (PROTONIX) 40 MG tablet Take 40 mg by mouth daily.     potassium chloride SA (KLOR-CON M) 20 MEQ tablet Take 20 mEq by mouth every other day.     Vitamin D, Ergocalciferol, (DRISDOL) 1.25 MG  (50000 UNIT) CAPS capsule Take 50,000 Units by mouth every Sunday.     No current facility-administered medications for this visit.    Allergies:   Gabapentin, Aspirin, Clarithromycin,  Doxycycline, Levaquin [levofloxacin in d5w], Penicillins, and Pravastatin    Social History:  The patient  reports that she has never smoked. She has never used smokeless tobacco. She reports that she does not drink alcohol and does not use drugs.   Family History:  The patient's family history includes Arthritis/Rheumatoid in her mother; Asthma in her mother and sister; COPD in her sister; Colon cancer in her maternal aunt; Emphysema in her sister; Liver disease in her father; Lung cancer in her maternal aunt; Pancreatic cancer in her brother; Stroke in her mother and sister.    ROS:  Please see the history of present illness.   Otherwise, review of systems are positive for increased stress.   All other systems are reviewed and negative.    PHYSICAL EXAM: VS:  BP 120/70   Pulse 65   Ht 5' (1.524 m)   Wt 165 lb (74.8 kg)   SpO2 97%   BMI 32.22 kg/m  , BMI Body mass index is 32.22 kg/m. GEN: Well nourished, well developed, in no acute distress HEENT: normal Neck: no JVD, carotid bruits, or masses Cardiac: RRR; no murmurs, rubs, or gallops,no edema  Respiratory:  clear to auscultation bilaterally, normal work of breathing GI: soft, nontender, nondistended, + BS MS: no deformity or atrophy Skin: warm and dry, no rash Neuro:  Strength and sensation are intact Psych: euthymic mood, full affect    Recent Labs: 09/29/2021: ALT 8 11/25/2021: BUN 19; Creatinine, Ser 1.41; Hemoglobin 10.8; Platelets 178; Potassium 4.0; Sodium 141   Lipid Panel    Component Value Date/Time   CHOL 146 11/26/2021 0830   CHOL 148 09/29/2021 0811   TRIG 34 11/26/2021 0830   HDL 44 11/26/2021 0830   HDL 43 09/29/2021 0811   CHOLHDL 3.3 11/26/2021 0830   VLDL 7 11/26/2021 0830   LDLCALC 95 11/26/2021 0830   LDLCALC 90  09/29/2021 0811     Other studies Reviewed: Additional studies/ records that were reviewed today with results demonstrating: hospital records and labs reviewed.   ASSESSMENT AND PLAN:  CAD: Atypical chest pain. Most recent cath showed no targets for PCI.  COntinue medical therapy. OK to use SL NTG as she os on low dose Imdur.  Moderate aortic stenosis/mild to moderate mitral regurgitation:  No need for intervention at this time.  Chronic diastolic heart failure: She appears euvolemic. Continue furosemide.  We discussed symptoms of fluid overload. HTN: The current medical regimen is effective;  continue present plan and medications. Hyperlipidemia: LDL 95 in 2023 June.  I reinforced using the relaxation techniques described at yesterday's visit including deep breathing.  I think stress is playing a big part in her symptoms and possibly in her blood pressure spikes that she had in the past.   Current medicines are reviewed at length with the patient today.  The patient concerns regarding her medicines were addressed.  The following changes have been made:  No change  Labs/ tests ordered today include:  No orders of the defined types were placed in this encounter.   Recommend 150 minutes/week of aerobic exercise Low fat, low carb, high fiber diet recommended  Disposition:   FU in 6 months   Signed, Lance Muss, MD  01/04/2022 9:24 AM    [facility] [address] [place], [place], [place]  [address] Phone: [phone]; Fax: [phone]

## 2022-01-04 ENCOUNTER — Ambulatory Visit: Payer: PPO | Admitting: Interventional Cardiology

## 2022-01-04 ENCOUNTER — Encounter: Payer: Self-pay | Admitting: Interventional Cardiology

## 2022-01-04 VITALS — BP 120/70 | HR 65 | Ht 60.0 in | Wt 165.0 lb

## 2022-01-04 DIAGNOSIS — I25118 Atherosclerotic heart disease of native coronary artery with other forms of angina pectoris: Secondary | ICD-10-CM

## 2022-01-04 DIAGNOSIS — I350 Nonrheumatic aortic (valve) stenosis: Secondary | ICD-10-CM | POA: Diagnosis not present

## 2022-01-04 DIAGNOSIS — I5032 Chronic diastolic (congestive) heart failure: Secondary | ICD-10-CM | POA: Diagnosis not present

## 2022-01-04 DIAGNOSIS — E785 Hyperlipidemia, unspecified: Secondary | ICD-10-CM

## 2022-01-04 DIAGNOSIS — I10 Essential (primary) hypertension: Secondary | ICD-10-CM

## 2022-01-04 NOTE — Patient Instructions (Signed)
Medication Instructions:  Your physician recommends that you continue on your current medications as directed. Please refer to the Current Medication list given to you today.  *If you need a refill on your cardiac medications before your next appointment, please call your pharmacy*   Lab Work: none If you have labs (blood work) drawn today and your tests are completely normal, you will receive your results only by: MyChart Message (if you have MyChart) OR A paper copy in the mail If you have any lab test that is abnormal or we need to change your treatment, we will call you to review the results.   Testing/Procedures: none   Follow-Up: At [facility], you and your health needs are our priority.  As part of our continuing mission to provide you with exceptional heart care, we have created designated Provider Care Teams.  These Care Teams include your primary Cardiologist (physician) and Advanced Practice Providers (APPs -  Physician Assistants and Nurse Practitioners) who all work together to provide you with the care you need, when you need it.  We recommend signing up for the patient portal called "MyChart".  Sign up information is provided on this After Visit Summary.  MyChart is used to connect with patients for Virtual Visits (Telemedicine).  Patients are able to view lab/test results, encounter notes, upcoming appointments, etc.  Non-urgent messages can be sent to your provider as well.   To learn more about what you can do with MyChart, go to [email or web address].    Your next appointment:   6 month(s)  The format for your next appointment:   In Person  Provider:   Jayadeep Varanasi, MD     Other Instructions    Important Information About Sugar       

## 2022-01-05 ENCOUNTER — Telehealth: Payer: Self-pay | Admitting: Interventional Cardiology

## 2022-01-05 NOTE — Telephone Encounter (Signed)
No way to really tell if she has developed an allergy to anything, agree with PCP recommendation. Patient has had itching as an adverse reaction in the past to other medications per her list

## 2022-01-05 NOTE — Telephone Encounter (Signed)
Spoke with the patient who states that Sunday her legs started itching. It has since moved to her arms and hands. She denies any rash but states that her skin is red and irritated because she has been scratching. She was concerned that this was due to a medication. Last medication change was addition of Imdur on 7/6. She denies any changes to skin care products or laundry detergents. Advised patient to reach out to her primary care provider. She would like the pharmacist to make sure it couldn't be from any of her medications.

## 2022-01-05 NOTE — Telephone Encounter (Signed)
Pt c/o medication issue:  1. Name of Medication:    2. How are you currently taking this medication (dosage and times per day)?    3. Are you having a reaction (difficulty breathing--STAT)? no  4. What is your medication issue? Patient calling to say on Sunday she starting itching and unsure of what meds caused it. Please advise

## 2022-01-07 DIAGNOSIS — L270 Generalized skin eruption due to drugs and medicaments taken internally: Secondary | ICD-10-CM | POA: Diagnosis not present

## 2022-01-07 DIAGNOSIS — E785 Hyperlipidemia, unspecified: Secondary | ICD-10-CM | POA: Diagnosis not present

## 2022-01-07 NOTE — Telephone Encounter (Signed)
Spoke with the patient and advised her to reach out to her PCP. Patient verbalized understanding

## 2022-01-14 DIAGNOSIS — R600 Localized edema: Secondary | ICD-10-CM | POA: Diagnosis not present

## 2022-01-14 DIAGNOSIS — L299 Pruritus, unspecified: Secondary | ICD-10-CM | POA: Diagnosis not present

## 2022-01-14 DIAGNOSIS — I872 Venous insufficiency (chronic) (peripheral): Secondary | ICD-10-CM | POA: Diagnosis not present

## 2022-01-14 DIAGNOSIS — L270 Generalized skin eruption due to drugs and medicaments taken internally: Secondary | ICD-10-CM | POA: Diagnosis not present

## 2022-01-14 DIAGNOSIS — R58 Hemorrhage, not elsewhere classified: Secondary | ICD-10-CM | POA: Diagnosis not present

## 2022-01-14 DIAGNOSIS — I10 Essential (primary) hypertension: Secondary | ICD-10-CM | POA: Diagnosis not present

## 2022-01-21 DIAGNOSIS — I25118 Atherosclerotic heart disease of native coronary artery with other forms of angina pectoris: Secondary | ICD-10-CM | POA: Diagnosis not present

## 2022-01-21 DIAGNOSIS — I110 Hypertensive heart disease with heart failure: Secondary | ICD-10-CM | POA: Diagnosis not present

## 2022-01-21 DIAGNOSIS — E669 Obesity, unspecified: Secondary | ICD-10-CM | POA: Diagnosis not present

## 2022-01-21 DIAGNOSIS — I700 Atherosclerosis of aorta: Secondary | ICD-10-CM | POA: Diagnosis not present

## 2022-01-21 DIAGNOSIS — M0550 Rheumatoid polyneuropathy with rheumatoid arthritis of unspecified site: Secondary | ICD-10-CM | POA: Diagnosis not present

## 2022-01-21 DIAGNOSIS — E876 Hypokalemia: Secondary | ICD-10-CM | POA: Diagnosis not present

## 2022-01-21 DIAGNOSIS — F330 Major depressive disorder, recurrent, mild: Secondary | ICD-10-CM | POA: Diagnosis not present

## 2022-01-21 DIAGNOSIS — E559 Vitamin D deficiency, unspecified: Secondary | ICD-10-CM | POA: Diagnosis not present

## 2022-01-21 DIAGNOSIS — I509 Heart failure, unspecified: Secondary | ICD-10-CM | POA: Diagnosis not present

## 2022-01-21 DIAGNOSIS — E785 Hyperlipidemia, unspecified: Secondary | ICD-10-CM | POA: Diagnosis not present

## 2022-01-21 DIAGNOSIS — E261 Secondary hyperaldosteronism: Secondary | ICD-10-CM | POA: Diagnosis not present

## 2022-01-21 DIAGNOSIS — J449 Chronic obstructive pulmonary disease, unspecified: Secondary | ICD-10-CM | POA: Diagnosis not present

## 2022-01-31 ENCOUNTER — Other Ambulatory Visit: Payer: Self-pay | Admitting: Interventional Cardiology

## 2022-02-17 DIAGNOSIS — J029 Acute pharyngitis, unspecified: Secondary | ICD-10-CM | POA: Diagnosis not present

## 2022-02-17 DIAGNOSIS — R5383 Other fatigue: Secondary | ICD-10-CM | POA: Diagnosis not present

## 2022-02-17 DIAGNOSIS — J069 Acute upper respiratory infection, unspecified: Secondary | ICD-10-CM | POA: Diagnosis not present

## 2022-02-17 DIAGNOSIS — R0981 Nasal congestion: Secondary | ICD-10-CM | POA: Diagnosis not present

## 2022-02-17 DIAGNOSIS — J449 Chronic obstructive pulmonary disease, unspecified: Secondary | ICD-10-CM | POA: Diagnosis not present

## 2022-02-17 DIAGNOSIS — R051 Acute cough: Secondary | ICD-10-CM | POA: Diagnosis not present

## 2022-02-24 ENCOUNTER — Ambulatory Visit: Payer: PPO | Admitting: Interventional Cardiology

## 2022-03-04 DIAGNOSIS — Z79899 Other long term (current) drug therapy: Secondary | ICD-10-CM | POA: Diagnosis not present

## 2022-03-04 DIAGNOSIS — G3184 Mild cognitive impairment, so stated: Secondary | ICD-10-CM | POA: Diagnosis not present

## 2022-03-07 DIAGNOSIS — I10 Essential (primary) hypertension: Secondary | ICD-10-CM | POA: Diagnosis not present

## 2022-03-07 DIAGNOSIS — K7460 Unspecified cirrhosis of liver: Secondary | ICD-10-CM | POA: Diagnosis not present

## 2022-03-24 DIAGNOSIS — E559 Vitamin D deficiency, unspecified: Secondary | ICD-10-CM | POA: Diagnosis not present

## 2022-03-24 DIAGNOSIS — R7989 Other specified abnormal findings of blood chemistry: Secondary | ICD-10-CM | POA: Diagnosis not present

## 2022-03-24 DIAGNOSIS — E785 Hyperlipidemia, unspecified: Secondary | ICD-10-CM | POA: Diagnosis not present

## 2022-03-24 DIAGNOSIS — R5383 Other fatigue: Secondary | ICD-10-CM | POA: Diagnosis not present

## 2022-03-24 DIAGNOSIS — I10 Essential (primary) hypertension: Secondary | ICD-10-CM | POA: Diagnosis not present

## 2022-03-31 DIAGNOSIS — G4733 Obstructive sleep apnea (adult) (pediatric): Secondary | ICD-10-CM | POA: Diagnosis not present

## 2022-03-31 DIAGNOSIS — M5117 Intervertebral disc disorders with radiculopathy, lumbosacral region: Secondary | ICD-10-CM | POA: Diagnosis not present

## 2022-03-31 DIAGNOSIS — T466X5A Adverse effect of antihyperlipidemic and antiarteriosclerotic drugs, initial encounter: Secondary | ICD-10-CM | POA: Diagnosis not present

## 2022-03-31 DIAGNOSIS — I10 Essential (primary) hypertension: Secondary | ICD-10-CM | POA: Diagnosis not present

## 2022-03-31 DIAGNOSIS — J449 Chronic obstructive pulmonary disease, unspecified: Secondary | ICD-10-CM | POA: Diagnosis not present

## 2022-03-31 DIAGNOSIS — M791 Myalgia, unspecified site: Secondary | ICD-10-CM | POA: Diagnosis not present

## 2022-03-31 DIAGNOSIS — E785 Hyperlipidemia, unspecified: Secondary | ICD-10-CM | POA: Diagnosis not present

## 2022-03-31 DIAGNOSIS — I700 Atherosclerosis of aorta: Secondary | ICD-10-CM | POA: Diagnosis not present

## 2022-03-31 DIAGNOSIS — K219 Gastro-esophageal reflux disease without esophagitis: Secondary | ICD-10-CM | POA: Diagnosis not present

## 2022-03-31 DIAGNOSIS — I2510 Atherosclerotic heart disease of native coronary artery without angina pectoris: Secondary | ICD-10-CM | POA: Diagnosis not present

## 2022-03-31 DIAGNOSIS — Z1331 Encounter for screening for depression: Secondary | ICD-10-CM | POA: Diagnosis not present

## 2022-03-31 DIAGNOSIS — E559 Vitamin D deficiency, unspecified: Secondary | ICD-10-CM | POA: Diagnosis not present

## 2022-03-31 DIAGNOSIS — Z0000 Encounter for general adult medical examination without abnormal findings: Secondary | ICD-10-CM | POA: Diagnosis not present

## 2022-03-31 DIAGNOSIS — Z1339 Encounter for screening examination for other mental health and behavioral disorders: Secondary | ICD-10-CM | POA: Diagnosis not present

## 2022-04-13 ENCOUNTER — Encounter: Payer: Self-pay | Admitting: Interventional Cardiology

## 2022-04-13 NOTE — Telephone Encounter (Signed)
Error

## 2022-04-14 DIAGNOSIS — K5730 Diverticulosis of large intestine without perforation or abscess without bleeding: Secondary | ICD-10-CM | POA: Diagnosis not present

## 2022-04-14 DIAGNOSIS — I10 Essential (primary) hypertension: Secondary | ICD-10-CM | POA: Diagnosis not present

## 2022-04-14 DIAGNOSIS — K219 Gastro-esophageal reflux disease without esophagitis: Secondary | ICD-10-CM | POA: Diagnosis not present

## 2022-04-14 DIAGNOSIS — E782 Mixed hyperlipidemia: Secondary | ICD-10-CM | POA: Diagnosis not present

## 2022-04-14 DIAGNOSIS — R079 Chest pain, unspecified: Secondary | ICD-10-CM | POA: Diagnosis not present

## 2022-04-18 ENCOUNTER — Telehealth: Payer: Self-pay | Admitting: *Deleted

## 2022-04-18 DIAGNOSIS — R079 Chest pain, unspecified: Secondary | ICD-10-CM

## 2022-04-18 DIAGNOSIS — R0609 Other forms of dyspnea: Secondary | ICD-10-CM

## 2022-04-18 NOTE — Telephone Encounter (Signed)
Received office note from visit with Dr Loreta Ave.  Note indicates patient has been having chest pain and she will discuss with cardiology for further recommendations.  I placed call to patient to discuss.  Left message to call office

## 2022-04-19 NOTE — Telephone Encounter (Signed)
Pt returning nurses call from yesterday. Please advise 

## 2022-04-19 NOTE — Telephone Encounter (Signed)
OK to check BMet abnd BNP for shortness of breath.

## 2022-04-19 NOTE — Telephone Encounter (Signed)
Pt called to report that she saw her GI MD Dr Loreta Ave... and Dr Loreta Ave advised her that she felt her symptoms were more cardiac in nature... she has been having sharp chest pain at night when laying down to go to sleep and she cannot rest... not as much with exertion but she is very anxious and wants to be sure she feels better before the holidays...she declined an appt with an APP.. I advised her that I cannot get her in with Dr Eldridge Dace for a week... she is asking if she can have any testing sooner that an appt.... I will forward to Dr Eldridge Dace for his review and any recommendations.    Last OV 01/2022.

## 2022-04-20 ENCOUNTER — Ambulatory Visit: Payer: PPO | Attending: Interventional Cardiology

## 2022-04-20 DIAGNOSIS — R0609 Other forms of dyspnea: Secondary | ICD-10-CM

## 2022-04-20 DIAGNOSIS — R079 Chest pain, unspecified: Secondary | ICD-10-CM

## 2022-04-20 NOTE — Telephone Encounter (Signed)
I spoke with patient and gave her message from Dr Eldridge Dace.  She is agreeable to seeing APP.  Appointment made for patient to see Lilian Coma on 04/22/22 at 11:55.  Patient would like to have labs checked prior to this appointment.  She will stop by office today for BMET and BNP

## 2022-04-20 NOTE — Telephone Encounter (Signed)
Patient calling back.   °

## 2022-04-21 ENCOUNTER — Other Ambulatory Visit: Payer: PPO

## 2022-04-21 ENCOUNTER — Telehealth: Payer: Self-pay

## 2022-04-21 DIAGNOSIS — I5032 Chronic diastolic (congestive) heart failure: Secondary | ICD-10-CM

## 2022-04-21 LAB — BASIC METABOLIC PANEL
BUN/Creatinine Ratio: 19 (ref 12–28)
BUN: 19 mg/dL (ref 8–27)
CO2: 24 mmol/L (ref 20–29)
Calcium: 9.2 mg/dL (ref 8.7–10.3)
Chloride: 108 mmol/L — ABNORMAL HIGH (ref 96–106)
Creatinine, Ser: 0.99 mg/dL (ref 0.57–1.00)
Glucose: 94 mg/dL (ref 70–99)
Potassium: 3.9 mmol/L (ref 3.5–5.2)
Sodium: 142 mmol/L (ref 134–144)
eGFR: 56 mL/min/{1.73_m2} — ABNORMAL LOW (ref >59)

## 2022-04-21 LAB — PRO B NATRIURETIC PEPTIDE: NT-Pro BNP: 1771 pg/mL — ABNORMAL HIGH (ref 0–738)

## 2022-04-21 MED ORDER — FUROSEMIDE 40 MG PO TABS: ORAL_TABLET | ORAL | 3 refills | Status: DC

## 2022-04-21 NOTE — H&P (View-Only) (Signed)
Cardiology Office Note:    Date:  04/22/2022   ID:  Genice Rouge, DOB 10/27/1935, MRN [id number]  PCP:  Jarome Matin (Inactive)  [facility] Providers Cardiologist:  Lance Muss, MD    Referring MD: No ref. provider found   Chief Complaint:  Chest Pain    Patient Profile: Coronary artery disease  Cath 2009: pRCA w CTO; dLM 20, mRI 60-70 (small), mOM1 50, mLAD 60, 70, dLAD sub-total occlusion; o/w mod non-obs dz No targets for PCI or CABG >> Med Rx Myoview in 07/16/11: inf ischemia Myoview in 02/09/15: inf ischemia, EF 50 Aortic stenosis Echo 08/05/2016: Mild focal basal septal hypertrophy, EF 60-65, no RWMA, Gr 1 DD, mild AS (mean 10 mmHg)  Echo 03/17/2020: EF 60-65, no RWMA, mild LVH, GRII DD, normal RVSF, normal PASP, severe LAE, small pericardial effusion, trivial MR, mild aortic stenosis (mean 11) Limited echo 06/17/2020: EF 60-65, no RWMA, moderate LVH, GR 1 DD, normal RVSF, mild LAE, mild MR, mild aortic stenosis (mean 8.5) Echo 11/26/2021: EF 65-70, no RWMA, mildly reduced RVSF, mild to moderate MR, moderate aortic stenosis (mean 17.4, V-max 281.2 cm/s, DI 0.31) Mitral regurgitation  (HFpEF) heart failure with preserved ejection fraction  Hypertension  PUD Hx of HBV, HCV GERD PVCs, PACs Chronic cough, ACEi stopped, ?related to GERD Possible asthma or upper airways cough syndrome  Hyperlipidemia  Intol of statins - Rosuvastatin (CK elevated); Pravastatin (rash); Atorvastatin (myalgias) Hx of pancreatitis Obesity  Rheumatoid arthritis  Normocytic anemia    Cardiac Studies & Procedures     STRESS TESTS  MYOCARDIAL PERFUSION IMAGING 02/19/2015  Narrative  The left ventricular ejection fraction is mildly decreased (45-54%).  Nuclear stress EF: 50%.  There was no ST segment deviation noted during stress.  No T wave inversion was noted during stress.  Defect 1: There is a small defect of moderate severity.  There is a small size, moderate  intensity reversible apical and apical inferior wall perfusion defect. This may represent ischemia (SDS 5). There is underlying inferior bowel attenuation and the calculated LVEF is 50% with inferolateral wall hypokinesis. This is an intermediate risk study. Clinical correlation is advised.   ECHOCARDIOGRAM  ECHOCARDIOGRAM COMPLETE 11/26/2021  Narrative ECHOCARDIOGRAM REPORT    Patient Name:   DANESSA BECKNELL Date of Exam: 11/26/2021 Medical Rec #:  [id number]             Height:       60.0 in Accession #:    [id number]            Weight:       168.4 lb Date of Birth:  04-Aug-1935              BSA:          1.735 m Patient Age:    86 years              BP:           171/82 mmHg Patient Gender: F                     HR:           54 bpm. Exam Location:  Inpatient  Procedure: 2D Echo, Cardiac Doppler and Color Doppler  Indications:    Cardiomegaly  History:        Patient has prior history of Echocardiogram examinations, most recent 06/17/2020. CHF, CAD, Aortic Valve Disease; Risk Factors:Hypertension.  Sonographer:    Rodrigo Ran RCS  Referring Phys: [id number] ANKIT CHIRAG AMIN  IMPRESSIONS   1. Left ventricular ejection fraction, by estimation, is 65 to 70%. The left ventricle has normal function. The left ventricle has no regional wall motion abnormalities. Left ventricular diastolic parameters are indeterminate. 2. Right ventricular systolic function is mildly reduced. The right ventricular size is mildly enlarged. 3. The mitral valve is normal in structure. Mild to moderate mitral valve regurgitation. 4. The aortic valve is calcified. Aortic valve regurgitation is not visualized. Moderate aortic valve stenosis.  FINDINGS Left Ventricle: Left ventricular ejection fraction, by estimation, is 65 to 70%. The left ventricle has normal function. The left ventricle has no regional wall motion abnormalities. The left ventricular internal cavity size was normal in size. There is no  left ventricular hypertrophy. Left ventricular diastolic parameters are indeterminate.  Right Ventricle: The right ventricular size is mildly enlarged. Right vetricular wall thickness was not well visualized. Right ventricular systolic function is mildly reduced.  Left Atrium: Left atrial size was normal in size.  Right Atrium: Right atrial size was normal in size.  Pericardium: There is no evidence of pericardial effusion.  Mitral Valve: The mitral valve is normal in structure. Mild to moderate mitral valve regurgitation. MV peak gradient, 9.2 mmHg. The mean mitral valve gradient is 5.0 mmHg.  Tricuspid Valve: The tricuspid valve is normal in structure. Tricuspid valve regurgitation is trivial.  Aortic Valve: The aortic valve is calcified. Aortic valve regurgitation is not visualized. Moderate aortic stenosis is present. Aortic valve mean gradient measures 17.4 mmHg. Aortic valve peak gradient measures 31.6 mmHg. Aortic valve area, by VTI measures 0.96 cm.  Pulmonic Valve: The pulmonic valve was normal in structure. Pulmonic valve regurgitation is trivial.  Aorta: The aortic root and ascending aorta are structurally normal, with no evidence of dilitation.  IAS/Shunts: The atrial septum is grossly normal.   LEFT VENTRICLE PLAX 2D LVIDd:         4.70 cm     Diastology LVIDs:         3.70 cm     LV e' medial:    6.96 cm/s LV PW:         1.00 cm     LV E/e' medial:  17.0 LV IVS:        1.10 cm     LV e' lateral:   8.86 cm/s LVOT diam:     2.00 cm     LV E/e' lateral: 13.3 LV SV:         65 LV SV Index:   37 LVOT Area:     3.14 cm  LV Volumes (MOD) LV vol d, MOD A2C: 60.4 ml LV vol d, MOD A4C: 92.5 ml LV vol s, MOD A2C: 27.8 ml LV vol s, MOD A4C: 44.4 ml LV SV MOD A2C:     32.6 ml LV SV MOD A4C:     92.5 ml LV SV MOD BP:      42.7 ml  RIGHT VENTRICLE             IVC RV Basal diam:  2.70 cm     IVC diam: 2.80 cm RV Mid diam:    2.20 cm RV S prime:     10.30 cm/s TAPSE  (M-mode): 1.6 cm  LEFT ATRIUM             Index        RIGHT ATRIUM          Index LA diam:  3.00 cm 1.73 cm/m   RA Area:     6.76 cm LA Vol (A2C):   54.0 ml 31.12 ml/m  RA Volume:   9.65 ml  5.56 ml/m LA Vol (A4C):   40.3 ml 23.23 ml/m LA Biplane Vol: 49.9 ml 28.76 ml/m AORTIC VALVE                     PULMONIC VALVE AV Area (Vmax):    0.96 cm      PV Vmax:          1.18 m/s AV Area (Vmean):   0.88 cm      PV Peak grad:     5.6 mmHg AV Area (VTI):     0.96 cm      PR End Diast Vel: 5.95 msec AV Vmax:           281.20 cm/s AV Vmean:          197.200 cm/s AV VTI:            0.675 m AV Peak Grad:      31.6 mmHg AV Mean Grad:      17.4 mmHg LVOT Vmax:         85.50 cm/s LVOT Vmean:        55.300 cm/s LVOT VTI:          0.207 m LVOT/AV VTI ratio: 0.31  AORTA Ao Root diam: 3.00 cm  MITRAL VALVE                  TRICUSPID VALVE MV Area (PHT): 4.06 cm       TR Peak grad:   31.4 mmHg MV Area VTI:   1.50 cm       TR Vmax:        280.00 cm/s MV Peak grad:  9.2 mmHg MV Mean grad:  5.0 mmHg       SHUNTS MV Vmax:       1.52 m/s       Systemic VTI:  0.21 m MV Vmean:      102.0 cm/s     Systemic Diam: 2.00 cm MV Decel Time: 187 msec MR Peak grad:    153.3 mmHg MR Mean grad:    126.0 mmHg MR Vmax:         619.00 cm/s MR Vmean:        551.0 cm/s MR PISA:         1.01 cm MR PISA Eff ROA: 6 mm MR PISA Radius:  0.40 cm MV E velocity: 118.00 cm/s MV A velocity: 122.00 cm/s MV E/A ratio:  0.97  Kristeen Miss MD Electronically signed by Kristeen Miss MD Signature Date/Time: 11/26/2021/3:37:14 PM    Final              History of Present Illness:   Susie Ehresman is a 86 y.o. female with the above problem list.  She was last seen by Dr. Eldridge Dace 01/04/2022.  She was recently seen by Dr. Loreta Ave with GI.  She was complaining of chest discomfort.  This mainly occurred at night while laying down.  BNP was obtained and this was elevated at 1771.  Dr. Eldridge Dace increased  her dose of furosemide.    She returns for evaluation of chest pain.  She notes that she has had chest discomfort for the past 6 or 7 months.  She notes radiation to her back.  She has not had neck or arm discomfort.  She really has several different types of chest discomfort.  She develops chest discomfort when she is stressed from taking care of her husband who has advanced dementia.  She also notes chest discomfort associated with belching.  Belching improves her symptoms.  She does not necessarily have dysphagia or odynophagia.  She also has symptoms of chest discomfort with exertion.  If she walks out to her mailbox and back, she has to stop due to fatigue and chest pain.  She does take nitroglycerin with relief.  She has noted shortness of breath with chest pain.  She has not had syncope, orthopnea.  She has had some leg edema but this is improved.  Some of her symptoms have improved since increasing her furosemide yesterday.     Past Medical History:  Diagnosis Date   Aortic stenosis    mild by echo in 03/2020   Arthritis    CAD (coronary artery disease)    a. 02/2008 Cath: EF 60%, RCA 100 w/ L->R collats, LM 20d, RI 60-70, small, LAD 60/33m, 99d, D1 50, OM1 50. Poor distal targets for CABG/no good interventional target -> medical Rx; b. Myoview in [place] in 3/11 -nl;  c. 07/2011 MV: EF 67%, mild inf ischemia->Med Rx; 02/2015 MV: EF 50%, small, mod intensity apical and apical inf rev defect-->Med Rx.   Chronic cough    hx; ACEI stopped, possible contribution from GERD   Chronic diastolic CHF (congestive heart failure) (HCC)    Echocardiogram 10/21: EF 60-65, no RWMA, mild LVH, Gr 2 DD, normal RVSF, severe LAE, small pericardial effusion, trivial MR, mild AS (mean 11 mmHg)   Depression    Dyspnea    a. 08/2013 Echo: EF 60-65%, mild AS;  b. PFTs (10/10): FVC 103%, ratio 74%. mild obstruction; c. CT chest (6/11) no evidence for interstitial lung dz. possible asthma/upper airway cough sydrome. aspirin &  beta blockers- potential causes for bronchospasm;   GERD (gastroesophageal reflux disease)    HBV (hepatitis B virus) infection    HCV (hepatitis C virus)    History of PAC's    hx   History of PVC's    hx   HLD (hyperlipidemia)    unable to tolerate most statins due to myalgias and elevated CK. thinks pravastatin casued a rash. Only tolerates crestor.   Hypertensive heart disease    IBS (irritable bowel syndrome)    Obesity    Osteoarthritis    knee; s/p TKR   Osteoporosis    Pancreatitis    hx   Pericardial effusion    small by echo in 03/2020 // Limited Echocardiogram 1/22: EF 60-65, no RWMA, mod LVH, Gr 1 DD, normal RVSF, mild LAE, mild MR, mild AS mean 8.5 mmHg), no pericardial effusion      PUD (peptic ulcer disease)    Sleep apnea    diagnosed in 2011 , did not want CPAP -    Current Medications: Current Meds  Medication Sig   acetaminophen (TYLENOL) 500 MG tablet Take 1,000 mg by mouth every 6 (six) hours as needed for moderate pain or headache.   albuterol (PROVENTIL) (2.5 MG/3ML) 0.083% nebulizer solution Take 3 mLs (2.5 mg total) by nebulization every 6 (six) hours as needed for wheezing or shortness of breath.   amLODipine (NORVASC) 5 MG tablet Take 1 tablet (5 mg total) by mouth daily.   budesonide-formoterol (SYMBICORT) 160-4.5 MCG/ACT inhaler Inhale 2 puffs into the lungs 2 (two) times daily as needed.   clopidogrel (PLAVIX) 75 MG  tablet TAKE 1 TABLET BY MOUTH EVERY DAY   escitalopram (LEXAPRO) 5 MG tablet Take 5 mg by mouth daily.   famotidine (PEPCID) 20 MG tablet Take 20 mg by mouth daily.   furosemide (LASIX) 40 MG tablet Take 1 tablet (40 mg total) by mouth 2 (two) times daily for 2 days, THEN 1 tablet (40 mg total) daily.   isosorbide mononitrate (IMDUR) 30 MG 24 hr tablet Take 0.5 tablets (15 mg total) by mouth daily.   isosorbide mononitrate (IMDUR) 30 MG 24 hr tablet Take 30 mg by mouth daily.   LINZESS 145 MCG CAPS capsule Take 145 mcg by mouth as needed  (constipation).   nitroGLYCERIN (NITROSTAT) 0.4 MG SL tablet PLACE 1 TABLET UNDER THE TONGUE EVERY 5 MINUTES AS NEEDED.   pantoprazole (PROTONIX) 40 MG tablet Take 40 mg by mouth daily.   potassium chloride SA (KLOR-CON M) 20 MEQ tablet Take 80 meq by mouth for 2 (two) days, then 20 meq by mouth daily   Vitamin D, Ergocalciferol, (DRISDOL) 1.25 MG (50000 UNIT) CAPS capsule Take 50,000 Units by mouth every Sunday.    Allergies:   Gabapentin, Aspirin, Clarithromycin, Doxycycline, Levaquin [levofloxacin in d5w], Penicillins, and Pravastatin   Social History   Occupational History   Occupation: retired     Associate Professor: RETIRED    Comment: [employer]  Tobacco Use   Smoking status: Never   Smokeless tobacco: Never   Tobacco comments:    2nd hand exposure x40 years   Vaping Use   Vaping Use: Never used  Substance and Sexual Activity   Alcohol use: No   Drug use: No   Sexual activity: Not Currently    Family Hx: The patient's family history includes Arthritis/Rheumatoid in her mother; Asthma in her mother and sister; COPD in her sister; Colon cancer in her maternal aunt; Emphysema in her sister; Liver disease in her father; Lung cancer in her maternal aunt; Pancreatic cancer in her brother; Stroke in her mother and sister.  Review of Systems  Gastrointestinal:  Negative for hematochezia and melena.  Genitourinary:  Negative for hematuria.     EKGs/Labs/Other Test Reviewed:    EKG:  EKG is  ordered today.  The ekg ordered today demonstrates NSR, HR 78, inferior Q waves, poor R wave progression, nonspecific ST-T wave changes, QTc 433   Recent Labs: 09/29/2021: ALT 8 11/25/2021: Hemoglobin 10.8; Platelets 178 04/20/2022: BUN 19; Creatinine, Ser 0.99; NT-Pro BNP 1,771; Potassium 3.9; Sodium 142   Recent Lipid Panel Recent Labs    11/26/21 0830  CHOL 146  TRIG 34  HDL 44  VLDL 7  LDLCALC 95      Risk Assessment/Calculations/Metrics:         HYPERTENSION  CONTROL Vitals:   04/22/22 1134 04/22/22 1304  BP: (!) 143/82 (!) 130/90    The patient's blood pressure is elevated above target today.  In order to address the patient's elevated BP: Blood pressure will be monitored at home to determine if medication changes need to be made.       Physical Exam:    VS:  BP (!) 130/90   Pulse 78   Ht 5' (1.524 m)   Wt 160 lb 6.4 oz (72.8 kg)   SpO2 98%   BMI 31.33 kg/m     Wt Readings from Last 3 Encounters:  04/22/22 160 lb 6.4 oz (72.8 kg)  01/04/22 165 lb (74.8 kg)  12/09/21 166 lb (75.3 kg)    Constitutional:  Appearance: Healthy appearance. Not in distress.  Neck:     Vascular: No JVR. JVD normal.  Pulmonary:     Effort: Pulmonary effort is normal.     Breath sounds: No wheezing. No rales.  Cardiovascular:     Normal rate. Regular rhythm. Normal S1. Normal S2.      Murmurs: There is a grade 2/6 crescendo-decrescendo systolic murmur at the URSB.  Edema:    Peripheral edema present.    Ankle: bilateral trace edema of the ankle. Abdominal:     Palpations: Abdomen is soft.  Skin:    General: Skin is warm and dry.  Neurological:     Mental Status: Alert and oriented to person, place and time.         ASSESSMENT & PLAN:   Coronary artery disease involving native coronary artery with angina pectoris (HCC) Cardiac catheterization in 2009 demonstrated an occluded RCA and occluded distal LAD.  She had moderate disease in the mid LAD and OM1 as well as moderate disease in the small RI.  She has been managed medically over the years.  Her most recent stress test was in 2016 and demonstrated inferior ischemia.  She now presents with chest discomfort.  This has several different features.  She has a component that sounds consistent with anxiety related to the care of her husband who has advanced dementia.  She also has symptoms that sound gastrointestinal in nature with associated belching.  She does clearly have exertional angina with  walking out to her mailbox.  She has been taking nitroglycerin several times a day to ease off her chest pain.  Her electrocardiogram does not demonstrate any acute findings.  I have discussed proceeding with cardiac catheterization with the patient.  Have also discussed this with Dr. Varanasi, her cardiologist.  He has been hesitant to proceed with cardiac catheterization as it is unlikely that she will have disease that is amenable to PCI or surgery.  I reviewed this with the patient and she is agreeable to proceed with cardiac catheterization if she does not improve with medical therapy.  Her furosemide was just increased yesterday.  Through a process of shared decision making, we have decided to proceed with increased diuretic therapy.  She will contact me on Monday and let me know if her symptoms have improved/resolved or continued.  If she continues to have chest discomfort, she would then proceed with cardiac catheterization which has been scheduled for Tuesday.  Continue amlodipine 5 mg daily, Plavix and 5 mg daily, Imdur 30 mg daily, as needed nitroglycerin.  Follow-up will be based upon whether or not she has cardiac catheterization.  Chronic heart failure with preserved ejection fraction (HCC) Recent BNP elevated consistent with volume excess.  Her furosemide was Increased yesterday.  She will continue with increased dose of furosemide and contact me on Monday to apprise me of her symptoms.  Consider spironolactone versus SGLT2 inhibitor in the future.  Hypertension Blood pressure uncontrolled.  Continue with increased diuresis as well as amlodipine 5 mg daily, Imdur 30 mg daily.  If blood pressure remains elevated, consider increasing amlodipine.  Moderate aortic stenosis Moderate by echocardiogram in June 2023.         Shared Decision Making/Informed Consent The risks [stroke (1 in 1000), death (1 in 1000), kidney failure [usually temporary] (1 in 500), bleeding (1 in 200), allergic  reaction [possibly serious] (1 in 200)], benefits (diagnostic support and management of coronary artery disease) and alternatives of a cardiac catheterization were   discussed in detail with Ms. Zorn and she is willing to proceed.   Dispo:  Return for Post Procedure Follow Up.   Medication Adjustments/Labs and Tests Ordered: Current medicines are reviewed at length with the patient today.  Concerns regarding medicines are outlined above.  Tests Ordered: Orders Placed This Encounter  Procedures   CBC   EKG 12-Lead   Medication Changes: No orders of the defined types were placed in this encounter.  Signed, Tereso Newcomer, PA-C  04/22/2022 1:17 PM    [facility] [address] [place], [place], [place]  [address] Phone: [phone]; Fax: [phone]

## 2022-04-21 NOTE — Telephone Encounter (Signed)
The patient has been notified of the result and verbalized understanding.  All questions (if any) were answered. Macie Burows, RN 04/21/2022 1:57 PM   Pt wrote down medication instructions. While speaking with pt reports took 6-7 NTG tablets throughout the day yesterday for CP.  Pt reports had not taken imdur.  Advised pt taking this amount of NTG is not advisable. Pt reports pain has settled today since taking imdur. Pt is scheduled to see Tereso Newcomer, PA 04/22/22 will send call to provider as an FYI.

## 2022-04-21 NOTE — Progress Notes (Signed)
Cardiology Office Note:    Date:  04/22/2022   ID:  Genice Rouge, DOB 10/27/1935, MRN [id number]  PCP:  Jarome Matin (Inactive)  [facility] Providers Cardiologist:  Lance Muss, MD    Referring MD: No ref. provider found   Chief Complaint:  Chest Pain    Patient Profile: Coronary artery disease  Cath 2009: pRCA w CTO; dLM 20, mRI 60-70 (small), mOM1 50, mLAD 60, 70, dLAD sub-total occlusion; o/w mod non-obs dz No targets for PCI or CABG >> Med Rx Myoview in 07/16/11: inf ischemia Myoview in 02/09/15: inf ischemia, EF 50 Aortic stenosis Echo 08/05/2016: Mild focal basal septal hypertrophy, EF 60-65, no RWMA, Gr 1 DD, mild AS (mean 10 mmHg)  Echo 03/17/2020: EF 60-65, no RWMA, mild LVH, GRII DD, normal RVSF, normal PASP, severe LAE, small pericardial effusion, trivial MR, mild aortic stenosis (mean 11) Limited echo 06/17/2020: EF 60-65, no RWMA, moderate LVH, GR 1 DD, normal RVSF, mild LAE, mild MR, mild aortic stenosis (mean 8.5) Echo 11/26/2021: EF 65-70, no RWMA, mildly reduced RVSF, mild to moderate MR, moderate aortic stenosis (mean 17.4, V-max 281.2 cm/s, DI 0.31) Mitral regurgitation  (HFpEF) heart failure with preserved ejection fraction  Hypertension  PUD Hx of HBV, HCV GERD PVCs, PACs Chronic cough, ACEi stopped, ?related to GERD Possible asthma or upper airways cough syndrome  Hyperlipidemia  Intol of statins - Rosuvastatin (CK elevated); Pravastatin (rash); Atorvastatin (myalgias) Hx of pancreatitis Obesity  Rheumatoid arthritis  Normocytic anemia    Cardiac Studies & Procedures     STRESS TESTS  MYOCARDIAL PERFUSION IMAGING 02/19/2015  Narrative  The left ventricular ejection fraction is mildly decreased (45-54%).  Nuclear stress EF: 50%.  There was no ST segment deviation noted during stress.  No T wave inversion was noted during stress.  Defect 1: There is a small defect of moderate severity.  There is a small size, moderate  intensity reversible apical and apical inferior wall perfusion defect. This may represent ischemia (SDS 5). There is underlying inferior bowel attenuation and the calculated LVEF is 50% with inferolateral wall hypokinesis. This is an intermediate risk study. Clinical correlation is advised.   ECHOCARDIOGRAM  ECHOCARDIOGRAM COMPLETE 11/26/2021  Narrative ECHOCARDIOGRAM REPORT    Patient Name:   [name] [name] Date of Exam: 11/26/2021 Medical Rec #:  [id number]             Height:       60.0 in Accession #:    [id number]            Weight:       168.4 lb Date of Birth:  04-Aug-1935              BSA:          1.735 m Patient Age:    86 years              BP:           171/82 mmHg Patient Gender: F                     HR:           54 bpm. Exam Location:  Inpatient  Procedure: 2D Echo, Cardiac Doppler and Color Doppler  Indications:    Cardiomegaly  History:        Patient has prior history of Echocardiogram examinations, most recent 06/17/2020. CHF, CAD, Aortic Valve Disease; Risk Factors:Hypertension.  Sonographer:    Rodrigo Ran RCS  Referring Phys: [id number] ANKIT CHIRAG AMIN  IMPRESSIONS   1. Left ventricular ejection fraction, by estimation, is 65 to 70%. The left ventricle has normal function. The left ventricle has no regional wall motion abnormalities. Left ventricular diastolic parameters are indeterminate. 2. Right ventricular systolic function is mildly reduced. The right ventricular size is mildly enlarged. 3. The mitral valve is normal in structure. Mild to moderate mitral valve regurgitation. 4. The aortic valve is calcified. Aortic valve regurgitation is not visualized. Moderate aortic valve stenosis.  FINDINGS Left Ventricle: Left ventricular ejection fraction, by estimation, is 65 to 70%. The left ventricle has normal function. The left ventricle has no regional wall motion abnormalities. The left ventricular internal cavity size was normal in size. There is no  left ventricular hypertrophy. Left ventricular diastolic parameters are indeterminate.  Right Ventricle: The right ventricular size is mildly enlarged. Right vetricular wall thickness was not well visualized. Right ventricular systolic function is mildly reduced.  Left Atrium: Left atrial size was normal in size.  Right Atrium: Right atrial size was normal in size.  Pericardium: There is no evidence of pericardial effusion.  Mitral Valve: The mitral valve is normal in structure. Mild to moderate mitral valve regurgitation. MV peak gradient, 9.2 mmHg. The mean mitral valve gradient is 5.0 mmHg.  Tricuspid Valve: The tricuspid valve is normal in structure. Tricuspid valve regurgitation is trivial.  Aortic Valve: The aortic valve is calcified. Aortic valve regurgitation is not visualized. Moderate aortic stenosis is present. Aortic valve mean gradient measures 17.4 mmHg. Aortic valve peak gradient measures 31.6 mmHg. Aortic valve area, by VTI measures 0.96 cm.  Pulmonic Valve: The pulmonic valve was normal in structure. Pulmonic valve regurgitation is trivial.  Aorta: The aortic root and ascending aorta are structurally normal, with no evidence of dilitation.  IAS/Shunts: The atrial septum is grossly normal.   LEFT VENTRICLE PLAX 2D LVIDd:         4.70 cm     Diastology LVIDs:         3.70 cm     LV e' medial:    6.96 cm/s LV PW:         1.00 cm     LV E/e' medial:  17.0 LV IVS:        1.10 cm     LV e' lateral:   8.86 cm/s LVOT diam:     2.00 cm     LV E/e' lateral: 13.3 LV SV:         65 LV SV Index:   37 LVOT Area:     3.14 cm  LV Volumes (MOD) LV vol d, MOD A2C: 60.4 ml LV vol d, MOD A4C: 92.5 ml LV vol s, MOD A2C: 27.8 ml LV vol s, MOD A4C: 44.4 ml LV SV MOD A2C:     32.6 ml LV SV MOD A4C:     92.5 ml LV SV MOD BP:      42.7 ml  RIGHT VENTRICLE             IVC RV Basal diam:  2.70 cm     IVC diam: 2.80 cm RV Mid diam:    2.20 cm RV S prime:     10.30 cm/s TAPSE  (M-mode): 1.6 cm  LEFT ATRIUM             Index        RIGHT ATRIUM          Index LA diam:  3.00 cm 1.73 cm/m   RA Area:     6.76 cm LA Vol (A2C):   54.0 ml 31.12 ml/m  RA Volume:   9.65 ml  5.56 ml/m LA Vol (A4C):   40.3 ml 23.23 ml/m LA Biplane Vol: 49.9 ml 28.76 ml/m AORTIC VALVE                     PULMONIC VALVE AV Area (Vmax):    0.96 cm      PV Vmax:          1.18 m/s AV Area (Vmean):   0.88 cm      PV Peak grad:     5.6 mmHg AV Area (VTI):     0.96 cm      PR End Diast Vel: 5.95 msec AV Vmax:           281.20 cm/s AV Vmean:          197.200 cm/s AV VTI:            0.675 m AV Peak Grad:      31.6 mmHg AV Mean Grad:      17.4 mmHg LVOT Vmax:         85.50 cm/s LVOT Vmean:        55.300 cm/s LVOT VTI:          0.207 m LVOT/AV VTI ratio: 0.31  AORTA Ao Root diam: 3.00 cm  MITRAL VALVE                  TRICUSPID VALVE MV Area (PHT): 4.06 cm       TR Peak grad:   31.4 mmHg MV Area VTI:   1.50 cm       TR Vmax:        280.00 cm/s MV Peak grad:  9.2 mmHg MV Mean grad:  5.0 mmHg       SHUNTS MV Vmax:       1.52 m/s       Systemic VTI:  0.21 m MV Vmean:      102.0 cm/s     Systemic Diam: 2.00 cm MV Decel Time: 187 msec MR Peak grad:    153.3 mmHg MR Mean grad:    126.0 mmHg MR Vmax:         619.00 cm/s MR Vmean:        551.0 cm/s MR PISA:         1.01 cm MR PISA Eff ROA: 6 mm MR PISA Radius:  0.40 cm MV E velocity: 118.00 cm/s MV A velocity: 122.00 cm/s MV E/A ratio:  0.97  Kristeen Miss MD Electronically signed by Kristeen Miss MD Signature Date/Time: 11/26/2021/3:37:14 PM    Final              History of Present Illness:   Susie Ehresman is a 86 y.o. female with the above problem list.  She was last seen by Dr. Eldridge Dace 01/04/2022.  She was recently seen by Dr. Loreta Ave with GI.  She was complaining of chest discomfort.  This mainly occurred at night while laying down.  BNP was obtained and this was elevated at 1771.  Dr. Eldridge Dace increased  her dose of furosemide.    She returns for evaluation of chest pain.  She notes that she has had chest discomfort for the past 6 or 7 months.  She notes radiation to her back.  She has not had neck or arm discomfort.  She really has several different types of chest discomfort.  She develops chest discomfort when she is stressed from taking care of her husband who has advanced dementia.  She also notes chest discomfort associated with belching.  Belching improves her symptoms.  She does not necessarily have dysphagia or odynophagia.  She also has symptoms of chest discomfort with exertion.  If she walks out to her mailbox and back, she has to stop due to fatigue and chest pain.  She does take nitroglycerin with relief.  She has noted shortness of breath with chest pain.  She has not had syncope, orthopnea.  She has had some leg edema but this is improved.  Some of her symptoms have improved since increasing her furosemide yesterday.     Past Medical History:  Diagnosis Date   Aortic stenosis    mild by echo in 03/2020   Arthritis    CAD (coronary artery disease)    a. 02/2008 Cath: EF 60%, RCA 100 w/ L->R collats, LM 20d, RI 60-70, small, LAD 60/33m, 99d, D1 50, OM1 50. Poor distal targets for CABG/no good interventional target -> medical Rx; b. Myoview in [place] in 3/11 -nl;  c. 07/2011 MV: EF 67%, mild inf ischemia->Med Rx; 02/2015 MV: EF 50%, small, mod intensity apical and apical inf rev defect-->Med Rx.   Chronic cough    hx; ACEI stopped, possible contribution from GERD   Chronic diastolic CHF (congestive heart failure) (HCC)    Echocardiogram 10/21: EF 60-65, no RWMA, mild LVH, Gr 2 DD, normal RVSF, severe LAE, small pericardial effusion, trivial MR, mild AS (mean 11 mmHg)   Depression    Dyspnea    a. 08/2013 Echo: EF 60-65%, mild AS;  b. PFTs (10/10): FVC 103%, ratio 74%. mild obstruction; c. CT chest (6/11) no evidence for interstitial lung dz. possible asthma/upper airway cough sydrome. aspirin &  beta blockers- potential causes for bronchospasm;   GERD (gastroesophageal reflux disease)    HBV (hepatitis B virus) infection    HCV (hepatitis C virus)    History of PAC's    hx   History of PVC's    hx   HLD (hyperlipidemia)    unable to tolerate most statins due to myalgias and elevated CK. thinks pravastatin casued a rash. Only tolerates crestor.   Hypertensive heart disease    IBS (irritable bowel syndrome)    Obesity    Osteoarthritis    knee; s/p TKR   Osteoporosis    Pancreatitis    hx   Pericardial effusion    small by echo in 03/2020 // Limited Echocardiogram 1/22: EF 60-65, no RWMA, mod LVH, Gr 1 DD, normal RVSF, mild LAE, mild MR, mild AS mean 8.5 mmHg), no pericardial effusion      PUD (peptic ulcer disease)    Sleep apnea    diagnosed in 2011 , did not want CPAP -    Current Medications: Current Meds  Medication Sig   acetaminophen (TYLENOL) 500 MG tablet Take 1,000 mg by mouth every 6 (six) hours as needed for moderate pain or headache.   albuterol (PROVENTIL) (2.5 MG/3ML) 0.083% nebulizer solution Take 3 mLs (2.5 mg total) by nebulization every 6 (six) hours as needed for wheezing or shortness of breath.   amLODipine (NORVASC) 5 MG tablet Take 1 tablet (5 mg total) by mouth daily.   budesonide-formoterol (SYMBICORT) 160-4.5 MCG/ACT inhaler Inhale 2 puffs into the lungs 2 (two) times daily as needed.   clopidogrel (PLAVIX) 75 MG  tablet TAKE 1 TABLET BY MOUTH EVERY DAY   escitalopram (LEXAPRO) 5 MG tablet Take 5 mg by mouth daily.   famotidine (PEPCID) 20 MG tablet Take 20 mg by mouth daily.   furosemide (LASIX) 40 MG tablet Take 1 tablet (40 mg total) by mouth 2 (two) times daily for 2 days, THEN 1 tablet (40 mg total) daily.   isosorbide mononitrate (IMDUR) 30 MG 24 hr tablet Take 0.5 tablets (15 mg total) by mouth daily.   isosorbide mononitrate (IMDUR) 30 MG 24 hr tablet Take 30 mg by mouth daily.   LINZESS 145 MCG CAPS capsule Take 145 mcg by mouth as needed  (constipation).   nitroGLYCERIN (NITROSTAT) 0.4 MG SL tablet PLACE 1 TABLET UNDER THE TONGUE EVERY 5 MINUTES AS NEEDED.   pantoprazole (PROTONIX) 40 MG tablet Take 40 mg by mouth daily.   potassium chloride SA (KLOR-CON M) 20 MEQ tablet Take 80 meq by mouth for 2 (two) days, then 20 meq by mouth daily   Vitamin D, Ergocalciferol, (DRISDOL) 1.25 MG (50000 UNIT) CAPS capsule Take 50,000 Units by mouth every Sunday.    Allergies:   Gabapentin, Aspirin, Clarithromycin, Doxycycline, Levaquin [levofloxacin in d5w], Penicillins, and Pravastatin   Social History   Occupational History   Occupation: retired     Associate Professor: RETIRED    Comment: [employer]  Tobacco Use   Smoking status: Never   Smokeless tobacco: Never   Tobacco comments:    2nd hand exposure x40 years   Vaping Use   Vaping Use: Never used  Substance and Sexual Activity   Alcohol use: No   Drug use: No   Sexual activity: Not Currently    Family Hx: The patient's family history includes Arthritis/Rheumatoid in her mother; Asthma in her mother and sister; COPD in her sister; Colon cancer in her maternal aunt; Emphysema in her sister; Liver disease in her father; Lung cancer in her maternal aunt; Pancreatic cancer in her brother; Stroke in her mother and sister.  Review of Systems  Gastrointestinal:  Negative for hematochezia and melena.  Genitourinary:  Negative for hematuria.     EKGs/Labs/Other Test Reviewed:    EKG:  EKG is  ordered today.  The ekg ordered today demonstrates NSR, HR 78, inferior Q waves, poor R wave progression, nonspecific ST-T wave changes, QTc 433   Recent Labs: 09/29/2021: ALT 8 11/25/2021: Hemoglobin 10.8; Platelets 178 04/20/2022: BUN 19; Creatinine, Ser 0.99; NT-Pro BNP 1,771; Potassium 3.9; Sodium 142   Recent Lipid Panel Recent Labs    11/26/21 0830  CHOL 146  TRIG 34  HDL 44  VLDL 7  LDLCALC 95      Risk Assessment/Calculations/Metrics:         HYPERTENSION  CONTROL Vitals:   04/22/22 1134 04/22/22 1304  BP: (!) 143/82 (!) 130/90    The patient's blood pressure is elevated above target today.  In order to address the patient's elevated BP: Blood pressure will be monitored at home to determine if medication changes need to be made.       Physical Exam:    VS:  BP (!) 130/90   Pulse 78   Ht 5' (1.524 m)   Wt 160 lb 6.4 oz (72.8 kg)   SpO2 98%   BMI 31.33 kg/m     Wt Readings from Last 3 Encounters:  04/22/22 160 lb 6.4 oz (72.8 kg)  01/04/22 165 lb (74.8 kg)  12/09/21 166 lb (75.3 kg)    Constitutional:  Appearance: Healthy appearance. Not in distress.  Neck:     Vascular: No JVR. JVD normal.  Pulmonary:     Effort: Pulmonary effort is normal.     Breath sounds: No wheezing. No rales.  Cardiovascular:     Normal rate. Regular rhythm. Normal S1. Normal S2.      Murmurs: There is a grade 2/6 crescendo-decrescendo systolic murmur at the URSB.  Edema:    Peripheral edema present.    Ankle: bilateral trace edema of the ankle. Abdominal:     Palpations: Abdomen is soft.  Skin:    General: Skin is warm and dry.  Neurological:     Mental Status: Alert and oriented to person, place and time.         ASSESSMENT & PLAN:   Coronary artery disease involving native coronary artery with angina pectoris [facility]) Cardiac catheterization in 2009 demonstrated an occluded RCA and occluded distal LAD.  She had moderate disease in the mid LAD and OM1 as well as moderate disease in the small RI.  She has been managed medically over the years.  Her most recent stress test was in 2016 and demonstrated inferior ischemia.  She now presents with chest discomfort.  This has several different features.  She has a component that sounds consistent with anxiety related to the care of her husband who has advanced dementia.  She also has symptoms that sound gastrointestinal in nature with associated belching.  She does clearly have exertional angina with  walking out to her mailbox.  She has been taking nitroglycerin several times a day to ease off her chest pain.  Her electrocardiogram does not demonstrate any acute findings.  I have discussed proceeding with cardiac catheterization with the patient.  Have also discussed this with Dr. Eldridge Dace, her cardiologist.  He has been hesitant to proceed with cardiac catheterization as it is unlikely that she will have disease that is amenable to PCI or surgery.  I reviewed this with the patient and she is agreeable to proceed with cardiac catheterization if she does not improve with medical therapy.  Her furosemide was just increased yesterday.  Through a process of shared decision making, we have decided to proceed with increased diuretic therapy.  She will contact me on Monday and let me know if her symptoms have improved/resolved or continued.  If she continues to have chest discomfort, she would then proceed with cardiac catheterization which has been scheduled for Tuesday.  Continue amlodipine 5 mg daily, Plavix and 5 mg daily, Imdur 30 mg daily, as needed nitroglycerin.  Follow-up will be based upon whether or not she has cardiac catheterization.  Chronic heart failure with preserved ejection fraction (HCC) Recent BNP elevated consistent with volume excess.  Her furosemide was Increased yesterday.  She will continue with increased dose of furosemide and contact me on Monday to apprise me of her symptoms.  Consider spironolactone versus SGLT2 inhibitor in the future.  Hypertension Blood pressure uncontrolled.  Continue with increased diuresis as well as amlodipine 5 mg daily, Imdur 30 mg daily.  If blood pressure remains elevated, consider increasing amlodipine.  Moderate aortic stenosis Moderate by echocardiogram in June 2023.         Shared Decision Making/Informed Consent The risks [stroke (1 in 1000), death (1 in 1000), kidney failure [usually temporary] (1 in 500), bleeding (1 in 200), allergic  reaction [possibly serious] (1 in 200)], benefits (diagnostic support and management of coronary artery disease) and alternatives of a cardiac catheterization were  discussed in detail with Ms. Zorn and she is willing to proceed.   Dispo:  Return for Post Procedure Follow Up.   Medication Adjustments/Labs and Tests Ordered: Current medicines are reviewed at length with the patient today.  Concerns regarding medicines are outlined above.  Tests Ordered: Orders Placed This Encounter  Procedures   CBC   EKG 12-Lead   Medication Changes: No orders of the defined types were placed in this encounter.  Signed, Tereso Newcomer, PA-C  04/22/2022 1:17 PM    [facility] [address] [place], [place], [place]  [address] Phone: [phone]; Fax: [phone]

## 2022-04-21 NOTE — Telephone Encounter (Signed)
-----   Message from Corky Crafts, MD sent at 04/21/2022  9:29 AM EST ----- Evidence of volume overload. Increase Lasix to 40 mg BID for 2 days and then 40 mg daily.  Repeat BNP and BMet in 1 week.

## 2022-04-22 ENCOUNTER — Ambulatory Visit: Payer: PPO | Attending: Physician Assistant | Admitting: Physician Assistant

## 2022-04-22 ENCOUNTER — Encounter: Payer: Self-pay | Admitting: Physician Assistant

## 2022-04-22 VITALS — BP 130/90 | HR 78 | Ht 60.0 in | Wt 160.4 lb

## 2022-04-22 DIAGNOSIS — I10 Essential (primary) hypertension: Secondary | ICD-10-CM | POA: Diagnosis not present

## 2022-04-22 DIAGNOSIS — I25119 Atherosclerotic heart disease of native coronary artery with unspecified angina pectoris: Secondary | ICD-10-CM | POA: Diagnosis not present

## 2022-04-22 DIAGNOSIS — I25118 Atherosclerotic heart disease of native coronary artery with other forms of angina pectoris: Secondary | ICD-10-CM

## 2022-04-22 DIAGNOSIS — I350 Nonrheumatic aortic (valve) stenosis: Secondary | ICD-10-CM | POA: Diagnosis not present

## 2022-04-22 DIAGNOSIS — I5032 Chronic diastolic (congestive) heart failure: Secondary | ICD-10-CM | POA: Diagnosis not present

## 2022-04-22 DIAGNOSIS — G3184 Mild cognitive impairment, so stated: Secondary | ICD-10-CM | POA: Diagnosis not present

## 2022-04-22 LAB — CBC
Hematocrit: 35.1 % (ref 34.0–46.6)
Hemoglobin: 11.8 g/dL (ref 11.1–15.9)
MCH: 29.3 pg (ref 26.6–33.0)
MCHC: 33.6 g/dL (ref 31.5–35.7)
MCV: 87 fL (ref 79–97)
Platelets: 200 10*3/uL (ref 150–450)
RBC: 4.03 x10E6/uL (ref 3.77–5.28)
RDW: 14.5 % (ref 11.7–15.4)
WBC: 7.9 10*3/uL (ref 3.4–10.8)

## 2022-04-22 NOTE — Assessment & Plan Note (Signed)
Cardiac catheterization in 2009 demonstrated an occluded RCA and occluded distal LAD.  She had moderate disease in the mid LAD and OM1 as well as moderate disease in the small RI.  She has been managed medically over the years.  Her most recent stress test was in 2016 and demonstrated inferior ischemia.  She now presents with chest discomfort.  This has several different features.  She has a component that sounds consistent with anxiety related to the care of her husband who has advanced dementia.  She also has symptoms that sound gastrointestinal in nature with associated belching.  She does clearly have exertional angina with walking out to her mailbox.  She has been taking nitroglycerin several times a day to ease off her chest pain.  Her electrocardiogram does not demonstrate any acute findings.  I have discussed proceeding with cardiac catheterization with the patient.  Have also discussed this with Dr. Eldridge Dace, her cardiologist.  He has been hesitant to proceed with cardiac catheterization as it is unlikely that she will have disease that is amenable to PCI or surgery.  I reviewed this with the patient and she is agreeable to proceed with cardiac catheterization if she does not improve with medical therapy.  Her furosemide was just increased yesterday.  Through a process of shared decision making, we have decided to proceed with increased diuretic therapy.  She will contact me on Monday and let me know if her symptoms have improved/resolved or continued.  If she continues to have chest discomfort, she would then proceed with cardiac catheterization which has been scheduled for Tuesday.  Continue amlodipine 5 mg daily, Plavix and 5 mg daily, Imdur 30 mg daily, as needed nitroglycerin.  Follow-up will be based upon whether or not she has cardiac catheterization.

## 2022-04-22 NOTE — Assessment & Plan Note (Signed)
Recent BNP elevated consistent with volume excess.  Her furosemide was Increased yesterday.  She will continue with increased dose of furosemide and contact me on Monday to apprise me of her symptoms.  Consider spironolactone versus SGLT2 inhibitor in the future.

## 2022-04-22 NOTE — Patient Instructions (Signed)
Medication Instructions:  Your physician recommends that you continue on your current medications as directed. Please refer to the Current Medication list given to you today.  *If you need a refill on your cardiac medications before your next appointment, please call your pharmacy*   Lab Work: TODAY:  CBC  If you have labs (blood work) drawn today and your tests are completely normal, you will receive your results only by: MyChart Message (if you have MyChart) OR A paper copy in the mail If you have any lab test that is abnormal or we need to change your treatment, we will call you to review the results.   Testing/Procedures: Your physician has requested that you have a cardiac catheterization. Cardiac catheterization is used to diagnose and/or treat various heart conditions. Doctors may recommend this procedure for a number of different reasons. The most common reason is to evaluate chest pain. Chest pain can be a symptom of coronary artery disease (CAD), and cardiac catheterization can show whether plaque is narrowing or blocking your heart's arteries. This procedure is also used to evaluate the valves, as well as measure the blood flow and oxygen levels in different parts of your heart. For further information please visit [email or web address] Please follow instruction sheet, BELOW:       Cardiac/Peripheral Catheterization   You are scheduled for a Cardiac Catheterization on Tuesday, November 21 with Dr. Lance Muss.  1. Please arrive at the Main Entrance A at [facility]: [address] [place], [place] [address] on November 21 at 6:30  (This time is two hours before your procedure to ensure your preparation). Free valet parking service is available. You will check in at ADMITTING. The support person will be asked to wait in the waiting room.  It is OK to have someone drop you off and come back when you are ready to be discharged.        Special note: Every effort is  made to have your procedure done on time. Please understand that emergencies sometimes delay scheduled procedures.   . 2. Diet: Do not eat solid foods after midnight.  You may have clear liquids until 5 AM the day of the procedure.  3. Labs: You will need to have blood drawn on TODAY  4. Medication instructions in preparation for your procedure:   Contrast Allergy: No   Stop taking, Lasix (Furosemide)  Tuesday, November 21, you can take it after the procedure     On the morning of your procedure, take Aspirin 81 mg and Plavix/Clopidogrel and any morning medicines NOT listed above.  You may use sips of water.  5. Plan to go home the same day, you will only stay overnight if medically necessary. 6. You MUST have a responsible adult to drive you home. 7. An adult MUST be with you the first 24 hours after you arrive home. 8. Bring a current list of your medications, and the last time and date medication taken. 9. Bring ID and current insurance cards. 10.Please wear clothes that are easy to get on and off and wear slip-on shoes.  Thank you for allowing [place] to care for you!   -- [facility]     Follow-Up: At [facility], you and your health needs are our priority.  As part of our continuing mission to provide you with exceptional heart care, we have created designated Provider Care Teams.  These Care Teams include your primary Cardiologist (physician) and Advanced Practice Providers (  APPs -  Physician Assistants and Nurse Practitioners) who all work together to provide you with the care you need, when you need it.  We recommend signing up for the patient portal called "MyChart".  Sign up information is provided on this After Visit Summary.  MyChart is used to connect with patients for Virtual Visits (Telemedicine).  Patients are able to view lab/test results, encounter notes, upcoming appointments, etc.  Non-urgent messages can be sent to your  provider as well.   To learn more about what you can do with MyChart, go to [email or web address].    Your next appointment:   2 week(s)  05/10/22 ARRIVE AT 2:00  The format for your next appointment:   In Person  Provider:   Lance Muss, MD  or Tereso Newcomer, PA-C         Other Instructions   Important Information About Sugar

## 2022-04-22 NOTE — Assessment & Plan Note (Signed)
Moderate by echocardiogram in June 2023.

## 2022-04-22 NOTE — Assessment & Plan Note (Signed)
Blood pressure uncontrolled.  Continue with increased diuresis as well as amlodipine 5 mg daily, Imdur 30 mg daily.  If blood pressure remains elevated, consider increasing amlodipine.

## 2022-04-23 DIAGNOSIS — I25118 Atherosclerotic heart disease of native coronary artery with other forms of angina pectoris: Secondary | ICD-10-CM | POA: Diagnosis not present

## 2022-04-24 DIAGNOSIS — Z09 Encounter for follow-up examination after completed treatment for conditions other than malignant neoplasm: Secondary | ICD-10-CM | POA: Diagnosis not present

## 2022-04-24 DIAGNOSIS — Z7902 Long term (current) use of antithrombotics/antiplatelets: Secondary | ICD-10-CM | POA: Diagnosis not present

## 2022-04-24 DIAGNOSIS — Z8679 Personal history of other diseases of the circulatory system: Secondary | ICD-10-CM | POA: Diagnosis not present

## 2022-04-25 ENCOUNTER — Other Ambulatory Visit: Payer: Self-pay | Admitting: Interventional Cardiology

## 2022-04-26 ENCOUNTER — Inpatient Hospital Stay (HOSPITAL_COMMUNITY)
Admission: RE | Disposition: A | Discharge status: Home / Self Care | Payer: Self-pay | Source: Home / Self Care | Attending: Interventional Cardiology

## 2022-04-26 ENCOUNTER — Encounter (HOSPITAL_COMMUNITY): Payer: Self-pay | Admitting: Interventional Cardiology

## 2022-04-26 ENCOUNTER — Other Ambulatory Visit: Payer: Self-pay

## 2022-04-26 ENCOUNTER — Inpatient Hospital Stay (HOSPITAL_COMMUNITY)
Admission: RE | Admit: 2022-04-26 | Discharge: 2022-04-28 | DRG: 287 | Disposition: A | Discharge status: Home / Self Care | Payer: PPO | Attending: Interventional Cardiology | Admitting: Interventional Cardiology

## 2022-04-26 DIAGNOSIS — I25118 Atherosclerotic heart disease of native coronary artery with other forms of angina pectoris: Secondary | ICD-10-CM

## 2022-04-26 DIAGNOSIS — Z801 Family history of malignant neoplasm of trachea, bronchus and lung: Secondary | ICD-10-CM

## 2022-04-26 DIAGNOSIS — M810 Age-related osteoporosis without current pathological fracture: Secondary | ICD-10-CM | POA: Diagnosis present

## 2022-04-26 DIAGNOSIS — I5032 Chronic diastolic (congestive) heart failure: Secondary | ICD-10-CM | POA: Diagnosis present

## 2022-04-26 DIAGNOSIS — E876 Hypokalemia: Secondary | ICD-10-CM | POA: Diagnosis present

## 2022-04-26 DIAGNOSIS — I350 Nonrheumatic aortic (valve) stenosis: Secondary | ICD-10-CM | POA: Diagnosis present

## 2022-04-26 DIAGNOSIS — Z7951 Long term (current) use of inhaled steroids: Secondary | ICD-10-CM

## 2022-04-26 DIAGNOSIS — K219 Gastro-esophageal reflux disease without esophagitis: Secondary | ICD-10-CM | POA: Diagnosis present

## 2022-04-26 DIAGNOSIS — F32A Depression, unspecified: Secondary | ICD-10-CM | POA: Diagnosis present

## 2022-04-26 DIAGNOSIS — Z825 Family history of asthma and other chronic lower respiratory diseases: Secondary | ICD-10-CM | POA: Diagnosis not present

## 2022-04-26 DIAGNOSIS — Z880 Allergy status to penicillin: Secondary | ICD-10-CM

## 2022-04-26 DIAGNOSIS — I10 Essential (primary) hypertension: Secondary | ICD-10-CM | POA: Diagnosis not present

## 2022-04-26 DIAGNOSIS — Z66 Do not resuscitate: Secondary | ICD-10-CM | POA: Diagnosis not present

## 2022-04-26 DIAGNOSIS — E669 Obesity, unspecified: Secondary | ICD-10-CM | POA: Diagnosis present

## 2022-04-26 DIAGNOSIS — Z888 Allergy status to other drugs, medicaments and biological substances status: Secondary | ICD-10-CM

## 2022-04-26 DIAGNOSIS — M069 Rheumatoid arthritis, unspecified: Secondary | ICD-10-CM | POA: Diagnosis present

## 2022-04-26 DIAGNOSIS — I130 Hypertensive heart and chronic kidney disease with heart failure and stage 1 through stage 4 chronic kidney disease, or unspecified chronic kidney disease: Secondary | ICD-10-CM | POA: Diagnosis present

## 2022-04-26 DIAGNOSIS — I25119 Atherosclerotic heart disease of native coronary artery with unspecified angina pectoris: Principal | ICD-10-CM | POA: Diagnosis present

## 2022-04-26 DIAGNOSIS — G4700 Insomnia, unspecified: Secondary | ICD-10-CM | POA: Diagnosis present

## 2022-04-26 DIAGNOSIS — Z886 Allergy status to analgesic agent status: Secondary | ICD-10-CM

## 2022-04-26 DIAGNOSIS — E782 Mixed hyperlipidemia: Secondary | ICD-10-CM | POA: Diagnosis not present

## 2022-04-26 DIAGNOSIS — Z7902 Long term (current) use of antithrombotics/antiplatelets: Secondary | ICD-10-CM

## 2022-04-26 DIAGNOSIS — N183 Chronic kidney disease, stage 3 unspecified: Secondary | ICD-10-CM | POA: Diagnosis present

## 2022-04-26 DIAGNOSIS — Z515 Encounter for palliative care: Secondary | ICD-10-CM | POA: Diagnosis not present

## 2022-04-26 DIAGNOSIS — R0789 Other chest pain: Secondary | ICD-10-CM | POA: Diagnosis present

## 2022-04-26 DIAGNOSIS — Z823 Family history of stroke: Secondary | ICD-10-CM | POA: Diagnosis not present

## 2022-04-26 DIAGNOSIS — K589 Irritable bowel syndrome without diarrhea: Secondary | ICD-10-CM | POA: Diagnosis present

## 2022-04-26 DIAGNOSIS — Z800 Family history of malignant neoplasm of digestive organs: Secondary | ICD-10-CM

## 2022-04-26 DIAGNOSIS — E785 Hyperlipidemia, unspecified: Secondary | ICD-10-CM | POA: Diagnosis present

## 2022-04-26 DIAGNOSIS — Z79899 Other long term (current) drug therapy: Secondary | ICD-10-CM | POA: Diagnosis not present

## 2022-04-26 DIAGNOSIS — Z7189 Other specified counseling: Secondary | ICD-10-CM | POA: Diagnosis not present

## 2022-04-26 DIAGNOSIS — Z789 Other specified health status: Secondary | ICD-10-CM | POA: Diagnosis not present

## 2022-04-26 DIAGNOSIS — I25110 Atherosclerotic heart disease of native coronary artery with unstable angina pectoris: Secondary | ICD-10-CM | POA: Diagnosis not present

## 2022-04-26 DIAGNOSIS — I2510 Atherosclerotic heart disease of native coronary artery without angina pectoris: Principal | ICD-10-CM | POA: Diagnosis present

## 2022-04-26 DIAGNOSIS — Z8711 Personal history of peptic ulcer disease: Secondary | ICD-10-CM

## 2022-04-26 HISTORY — PX: RIGHT/LEFT HEART CATH AND CORONARY ANGIOGRAPHY: CATH118266

## 2022-04-26 LAB — POCT I-STAT EG7
Acid-base deficit: 2 mmol/L (ref 0.0–2.0)
Acid-base deficit: 2 mmol/L (ref 0.0–2.0)
Bicarbonate: 21.8 mmol/L (ref 20.0–28.0)
Bicarbonate: 22 mmol/L (ref 20.0–28.0)
Calcium, Ion: 1.16 mmol/L (ref 1.15–1.40)
Calcium, Ion: 1.2 mmol/L (ref 1.15–1.40)
HCT: 32 % — ABNORMAL LOW (ref 36.0–46.0)
HCT: 34 % — ABNORMAL LOW (ref 36.0–46.0)
Hemoglobin: 10.9 g/dL — ABNORMAL LOW (ref 12.0–15.0)
Hemoglobin: 11.6 g/dL — ABNORMAL LOW (ref 12.0–15.0)
O2 Saturation: 64 %
O2 Saturation: 70 %
Potassium: 3.3 mmol/L — ABNORMAL LOW (ref 3.5–5.1)
Potassium: 3.3 mmol/L — ABNORMAL LOW (ref 3.5–5.1)
Sodium: 144 mmol/L (ref 135–145)
Sodium: 144 mmol/L (ref 135–145)
TCO2: 23 mmol/L (ref 22–32)
TCO2: 23 mmol/L (ref 22–32)
pCO2, Ven: 31.7 mmHg — ABNORMAL LOW (ref 44–60)
pCO2, Ven: 34.3 mmHg — ABNORMAL LOW (ref 44–60)
pH, Ven: 7.415 (ref 7.25–7.43)
pH, Ven: 7.444 — ABNORMAL HIGH (ref 7.25–7.43)
pO2, Ven: 32 mmHg (ref 32–45)
pO2, Ven: 35 mmHg (ref 32–45)

## 2022-04-26 LAB — POCT I-STAT 7, (LYTES, BLD GAS, ICA,H+H)
Acid-base deficit: 2 mmol/L (ref 0.0–2.0)
Acid-base deficit: 3 mmol/L — ABNORMAL HIGH (ref 0.0–2.0)
Bicarbonate: 20.7 mmol/L (ref 20.0–28.0)
Bicarbonate: 21.4 mmol/L (ref 20.0–28.0)
Calcium, Ion: 1.15 mmol/L (ref 1.15–1.40)
Calcium, Ion: 1.15 mmol/L (ref 1.15–1.40)
HCT: 31 % — ABNORMAL LOW (ref 36.0–46.0)
HCT: 33 % — ABNORMAL LOW (ref 36.0–46.0)
Hemoglobin: 10.5 g/dL — ABNORMAL LOW (ref 12.0–15.0)
Hemoglobin: 11.2 g/dL — ABNORMAL LOW (ref 12.0–15.0)
O2 Saturation: 88 %
O2 Saturation: 88 %
Potassium: 3.2 mmol/L — ABNORMAL LOW (ref 3.5–5.1)
Potassium: 3.3 mmol/L — ABNORMAL LOW (ref 3.5–5.1)
Sodium: 145 mmol/L (ref 135–145)
Sodium: 145 mmol/L (ref 135–145)
TCO2: 22 mmol/L (ref 22–32)
TCO2: 22 mmol/L (ref 22–32)
pCO2 arterial: 31.4 mmHg — ABNORMAL LOW (ref 32–48)
pCO2 arterial: 32.8 mmHg (ref 32–48)
pH, Arterial: 7.423 (ref 7.35–7.45)
pH, Arterial: 7.426 (ref 7.35–7.45)
pO2, Arterial: 52 mmHg — ABNORMAL LOW (ref 83–108)
pO2, Arterial: 53 mmHg — ABNORMAL LOW (ref 83–108)

## 2022-04-26 SURGERY — RIGHT/LEFT HEART CATH AND CORONARY ANGIOGRAPHY
Anesthesia: LOCAL

## 2022-04-26 MED ORDER — HEPARIN SODIUM (PORCINE) 1000 UNIT/ML IJ SOLN
INTRAMUSCULAR | Status: AC
  Filled 2022-04-26: qty 10

## 2022-04-26 MED ORDER — MOMETASONE FURO-FORMOTEROL FUM 200-5 MCG/ACT IN AERO
2.0000 | INHALATION_SPRAY | Freq: Two times a day (BID) | RESPIRATORY_TRACT | Status: DC
  Administered 2022-04-27 – 2022-04-28 (×3): 2 via RESPIRATORY_TRACT
  Filled 2022-04-26: qty 8.8

## 2022-04-26 MED ORDER — ASPIRIN 81 MG PO CHEW: 81.0000 mg | CHEWABLE_TABLET | ORAL | Status: DC

## 2022-04-26 MED ORDER — ONDANSETRON HCL 4 MG/2ML IJ SOLN
4.0000 mg | Freq: Four times a day (QID) | INTRAMUSCULAR | Status: DC | PRN
  Filled 2022-04-26: qty 2

## 2022-04-26 MED ORDER — ALBUTEROL SULFATE (2.5 MG/3ML) 0.083% IN NEBU: 2.5000 mg | INHALATION_SOLUTION | Freq: Four times a day (QID) | RESPIRATORY_TRACT | Status: DC | PRN

## 2022-04-26 MED ORDER — IRBESARTAN 300 MG PO TABS
300.0000 mg | ORAL_TABLET | Freq: Every day | ORAL | Status: DC
  Administered 2022-04-26 – 2022-04-28 (×3): 300 mg via ORAL
  Filled 2022-04-26 (×3): qty 1

## 2022-04-26 MED ORDER — ISOSORBIDE MONONITRATE ER 30 MG PO TB24
30.0000 mg | ORAL_TABLET | Freq: Every day | ORAL | Status: DC
  Filled 2022-04-26: qty 1

## 2022-04-26 MED ORDER — PANTOPRAZOLE SODIUM 40 MG PO TBEC
40.0000 mg | DELAYED_RELEASE_TABLET | Freq: Every day | ORAL | Status: DC
  Administered 2022-04-27: 40 mg via ORAL
  Filled 2022-04-26: qty 1

## 2022-04-26 MED ORDER — ACETAMINOPHEN 500 MG PO TABS: 1000.0000 mg | ORAL_TABLET | Freq: Four times a day (QID) | ORAL | Status: DC | PRN

## 2022-04-26 MED ORDER — CLOPIDOGREL BISULFATE 75 MG PO TABS
75.0000 mg | ORAL_TABLET | Freq: Every day | ORAL | Status: DC
  Administered 2022-04-27 – 2022-04-28 (×2): 75 mg via ORAL
  Filled 2022-04-26 (×2): qty 1

## 2022-04-26 MED ORDER — METOCLOPRAMIDE HCL 5 MG PO TABS
10.0000 mg | ORAL_TABLET | Freq: Every day | ORAL | Status: DC
  Administered 2022-04-26 – 2022-04-28 (×3): 10 mg via ORAL
  Filled 2022-04-26 (×3): qty 2

## 2022-04-26 MED ORDER — ONDANSETRON HCL 4 MG/2ML IJ SOLN
INTRAMUSCULAR | Status: AC
  Filled 2022-04-26: qty 2

## 2022-04-26 MED ORDER — ALUM & MAG HYDROXIDE-SIMETH 200-200-20 MG/5ML PO SUSP
15.0000 mL | ORAL | Status: DC | PRN
  Administered 2022-04-26: 30 mL via ORAL
  Filled 2022-04-26 (×2): qty 30

## 2022-04-26 MED ORDER — MIDAZOLAM HCL 2 MG/2ML IJ SOLN
INTRAMUSCULAR | Status: DC | PRN
  Administered 2022-04-26 (×3): 1 mg via INTRAVENOUS

## 2022-04-26 MED ORDER — FENTANYL CITRATE (PF) 100 MCG/2ML IJ SOLN
INTRAMUSCULAR | Status: DC | PRN
  Administered 2022-04-26 (×4): 25 ug via INTRAVENOUS

## 2022-04-26 MED ORDER — SODIUM CHLORIDE 0.9 % IV SOLN: 250.0000 mL | INTRAVENOUS | Status: DC | PRN

## 2022-04-26 MED ORDER — MIDAZOLAM HCL 2 MG/2ML IJ SOLN
INTRAMUSCULAR | Status: AC
  Filled 2022-04-26: qty 2

## 2022-04-26 MED ORDER — POTASSIUM CHLORIDE CRYS ER 20 MEQ PO TBCR
20.0000 meq | EXTENDED_RELEASE_TABLET | Freq: Every day | ORAL | Status: DC
  Administered 2022-04-26 – 2022-04-28 (×3): 20 meq via ORAL
  Filled 2022-04-26 (×3): qty 1

## 2022-04-26 MED ORDER — LINACLOTIDE 145 MCG PO CAPS: 145.0000 ug | ORAL_CAPSULE | ORAL | Status: DC | PRN

## 2022-04-26 MED ORDER — SODIUM CHLORIDE 0.9% FLUSH: 3.0000 mL | INTRAVENOUS | Status: DC | PRN

## 2022-04-26 MED ORDER — CLOPIDOGREL BISULFATE 75 MG PO TABS
75.0000 mg | ORAL_TABLET | Freq: Once | ORAL | Status: AC
  Administered 2022-04-26: 75 mg via ORAL
  Filled 2022-04-26: qty 1

## 2022-04-26 MED ORDER — FENTANYL CITRATE (PF) 100 MCG/2ML IJ SOLN
INTRAMUSCULAR | Status: AC
  Filled 2022-04-26: qty 2

## 2022-04-26 MED ORDER — CALCIUM CARBONATE ANTACID 500 MG PO CHEW
1.0000 | CHEWABLE_TABLET | Freq: Once | ORAL | Status: AC | PRN
  Administered 2022-04-27: 200 mg via ORAL
  Filled 2022-04-26: qty 1

## 2022-04-26 MED ORDER — HEPARIN (PORCINE) IN NACL 1000-0.9 UT/500ML-% IV SOLN
INTRAVENOUS | Status: AC
  Filled 2022-04-26: qty 500

## 2022-04-26 MED ORDER — NITROGLYCERIN 0.4 MG SL SUBL
SUBLINGUAL_TABLET | SUBLINGUAL | Status: AC
  Filled 2022-04-26: qty 1

## 2022-04-26 MED ORDER — LABETALOL HCL 5 MG/ML IV SOLN: 10.0000 mg | INTRAVENOUS | Status: AC | PRN

## 2022-04-26 MED ORDER — VERAPAMIL HCL 2.5 MG/ML IV SOLN
INTRAVENOUS | Status: AC
  Filled 2022-04-26: qty 2

## 2022-04-26 MED ORDER — NITROGLYCERIN 0.4 MG SL SUBL
0.4000 mg | SUBLINGUAL_TABLET | SUBLINGUAL | Status: DC | PRN
  Administered 2022-04-26: 0.4 mg via SUBLINGUAL
  Filled 2022-04-26: qty 1

## 2022-04-26 MED ORDER — HEPARIN (PORCINE) IN NACL 1000-0.9 UT/500ML-% IV SOLN
INTRAVENOUS | Status: DC | PRN
  Administered 2022-04-26 (×3): 500 mL

## 2022-04-26 MED ORDER — HEPARIN (PORCINE) IN NACL 1000-0.9 UT/500ML-% IV SOLN
INTRAVENOUS | Status: AC
  Filled 2022-04-26: qty 1000

## 2022-04-26 MED ORDER — SODIUM CHLORIDE 0.9% FLUSH
3.0000 mL | Freq: Two times a day (BID) | INTRAVENOUS | Status: DC
  Administered 2022-04-27 – 2022-04-28 (×3): 3 mL via INTRAVENOUS

## 2022-04-26 MED ORDER — VITAMIN D (ERGOCALCIFEROL) 1.25 MG (50000 UNIT) PO CAPS: 50000.0000 [IU] | ORAL_CAPSULE | ORAL | Status: DC

## 2022-04-26 MED ORDER — HYDRALAZINE HCL 20 MG/ML IJ SOLN: 10.0000 mg | INTRAMUSCULAR | Status: AC | PRN

## 2022-04-26 MED ORDER — ACETAMINOPHEN 325 MG PO TABS
650.0000 mg | ORAL_TABLET | ORAL | Status: DC | PRN
  Administered 2022-04-26: 650 mg via ORAL
  Filled 2022-04-26: qty 2

## 2022-04-26 MED ORDER — NITROGLYCERIN 0.4 MG SL SUBL
SUBLINGUAL_TABLET | SUBLINGUAL | Status: DC | PRN
  Administered 2022-04-26: .4 mg via SUBLINGUAL

## 2022-04-26 MED ORDER — SODIUM CHLORIDE 0.9 % IV SOLN: INTRAVENOUS | Status: AC

## 2022-04-26 MED ORDER — SODIUM CHLORIDE 0.9 % WEIGHT BASED INFUSION: 1.0000 mL/kg/h | INTRAVENOUS | Status: DC

## 2022-04-26 MED ORDER — LIDOCAINE HCL (PF) 1 % IJ SOLN
INTRAMUSCULAR | Status: AC
  Filled 2022-04-26: qty 30

## 2022-04-26 MED ORDER — SODIUM CHLORIDE 0.9 % WEIGHT BASED INFUSION
3.0000 mL/kg/h | INTRAVENOUS | Status: DC
  Administered 2022-04-26: 3 mL/kg/h via INTRAVENOUS

## 2022-04-26 MED ORDER — AMLODIPINE BESYLATE 5 MG PO TABS: 5.0000 mg | ORAL_TABLET | Freq: Every day | ORAL | Status: DC

## 2022-04-26 MED ORDER — ONDANSETRON HCL 4 MG/2ML IJ SOLN
INTRAMUSCULAR | Status: DC | PRN
  Administered 2022-04-26: 4 mg via INTRAVENOUS

## 2022-04-26 MED ORDER — ALUM & MAG HYDROXIDE-SIMETH 200-200-20 MG/5ML PO SUSP
30.0000 mL | Freq: Once | ORAL | Status: AC
  Administered 2022-04-26: 30 mL via ORAL
  Filled 2022-04-26: qty 30

## 2022-04-26 MED ORDER — LIDOCAINE HCL (PF) 1 % IJ SOLN
INTRAMUSCULAR | Status: DC | PRN
  Administered 2022-04-26 (×2): 2 mL
  Administered 2022-04-26: 15 mL

## 2022-04-26 MED ORDER — VERAPAMIL HCL 2.5 MG/ML IV SOLN
INTRAVENOUS | Status: DC | PRN
  Administered 2022-04-26: 10 mL via INTRA_ARTERIAL

## 2022-04-26 SURGICAL SUPPLY — 21 items
CATH 5FR JL3.5 JR4 ANG PIG MP (CATHETERS) IMPLANT
CATH INFINITI 5 FR AR1 MOD (CATHETERS) IMPLANT
CATH INFINITI 5FR AL1 (CATHETERS) IMPLANT
CATH INFINITI 5FR JL4 (CATHETERS) IMPLANT
CATH INFINITI JR4 5F (CATHETERS) IMPLANT
CATH SWAN GANZ 7F STRAIGHT (CATHETERS) IMPLANT
DEVICE RAD COMP TR BAND LRG (VASCULAR PRODUCTS) IMPLANT
ELECT DEFIB PAD ADLT CADENCE (PAD) IMPLANT
GLIDESHEATH SLEND SS 6F .021 (SHEATH) IMPLANT
GLIDESHEATH SLENDER 7FR .021G (SHEATH) IMPLANT
GUIDEWIRE .025 260CM (WIRE) IMPLANT
GUIDEWIRE INQWIRE 1.5J.035X260 (WIRE) IMPLANT
INQWIRE 1.5J .035X260CM (WIRE) ×2
KIT HEART LEFT (KITS) ×2 IMPLANT
PACK CARDIAC CATHETERIZATION (CUSTOM PROCEDURE TRAY) ×2 IMPLANT
SHEATH PINNACLE 5F 10CM (SHEATH) IMPLANT
SHEATH PROBE COVER 6X72 (BAG) IMPLANT
TRANSDUCER W/STOPCOCK (MISCELLANEOUS) ×2 IMPLANT
TUBING CIL FLEX 10 FLL-RA (TUBING) ×2 IMPLANT
WIRE EMERALD 3MM-J .035X150CM (WIRE) IMPLANT
WIRE HI TORQ VERSACORE-J 145CM (WIRE) IMPLANT

## 2022-04-26 NOTE — Progress Notes (Signed)
28fr sheath removed intact from right groin per Clearnce Hasten, RN. Manual pressure applied to site x . No bleeding or hematoma palpable. 4x4 gauze and tegaderm dressing applied. Post activity and precautions explained. Patient verbalized understanding. Call bell near.Mamie Levers

## 2022-04-26 NOTE — Interval H&P Note (Signed)
Cath Lab Visit (complete for each Cath Lab visit)  Clinical Evaluation Leading to the Procedure:   ACS: No.  Non-ACS:    Anginal Classification: CCS III  Anti-ischemic medical therapy: Maximal Therapy (2 or more classes of medications)  Non-Invasive Test Results: No non-invasive testing performed  Prior CABG: No previous CABG   Known occluded distal LAD and RCA.   History and Physical Interval Note:  04/26/2022 9:20 AM  [name] [name]  has presented today for surgery, with the diagnosis of chest pain.  The various methods of treatment have been discussed with the patient and family. After consideration of risks, benefits and other options for treatment, the patient has consented to  Procedure(s): RIGHT/LEFT HEART CATH AND CORONARY ANGIOGRAPHY (N/A) as a surgical intervention.  The patient's history has been reviewed, patient examined, no change in status, stable for surgery.  I have reviewed the patient's chart and labs.  Questions were answered to the patient's satisfaction.     Lance Muss

## 2022-04-26 NOTE — TOC Progression Note (Signed)
Transition of Care [facility]) - Progression Note    Patient Details  Name: [name] [name] MRN: [id number] Date of Birth: June 16, 1935  Transition of Care [facility]) CM/SW Contact  Leone Haven, RN Phone Number: 04/26/2022, 5:11 PM  Clinical Narrative:    From home with spouse, chest pain, s/p cath. TOC following.        Expected Discharge Plan and Services                                                 Social Determinants of Health (SDOH) Interventions    Readmission Risk Interventions     No data to display

## 2022-04-27 ENCOUNTER — Other Ambulatory Visit: Payer: PPO

## 2022-04-27 DIAGNOSIS — I25110 Atherosclerotic heart disease of native coronary artery with unstable angina pectoris: Secondary | ICD-10-CM | POA: Diagnosis not present

## 2022-04-27 DIAGNOSIS — Z515 Encounter for palliative care: Secondary | ICD-10-CM | POA: Diagnosis not present

## 2022-04-27 DIAGNOSIS — Z7189 Other specified counseling: Secondary | ICD-10-CM

## 2022-04-27 DIAGNOSIS — Z66 Do not resuscitate: Secondary | ICD-10-CM | POA: Diagnosis not present

## 2022-04-27 DIAGNOSIS — I25119 Atherosclerotic heart disease of native coronary artery with unspecified angina pectoris: Secondary | ICD-10-CM | POA: Diagnosis not present

## 2022-04-27 LAB — BASIC METABOLIC PANEL
Anion gap: 5 (ref 5–15)
BUN: 13 mg/dL (ref 8–23)
CO2: 22 mmol/L (ref 22–32)
Calcium: 8.6 mg/dL — ABNORMAL LOW (ref 8.9–10.3)
Chloride: 114 mmol/L — ABNORMAL HIGH (ref 98–111)
Creatinine, Ser: 1.16 mg/dL — ABNORMAL HIGH (ref 0.44–1.00)
GFR, Estimated: 46 mL/min — ABNORMAL LOW (ref >60)
Glucose, Bld: 119 mg/dL — ABNORMAL HIGH (ref 70–99)
Potassium: 3.6 mmol/L (ref 3.5–5.1)
Sodium: 141 mmol/L (ref 135–145)

## 2022-04-27 MED ORDER — FAMOTIDINE 20 MG PO TABS
20.0000 mg | ORAL_TABLET | Freq: Every day | ORAL | Status: DC
  Administered 2022-04-27 – 2022-04-28 (×2): 20 mg via ORAL
  Filled 2022-04-27 (×2): qty 1

## 2022-04-27 MED ORDER — TRAZODONE HCL 50 MG PO TABS
25.0000 mg | ORAL_TABLET | Freq: Every day | ORAL | Status: DC
  Administered 2022-04-27: 25 mg via ORAL
  Filled 2022-04-27: qty 1

## 2022-04-27 MED ORDER — METOPROLOL TARTRATE 25 MG PO TABS
25.0000 mg | ORAL_TABLET | Freq: Two times a day (BID) | ORAL | Status: DC
  Administered 2022-04-27 – 2022-04-28 (×3): 25 mg via ORAL
  Filled 2022-04-27 (×3): qty 1

## 2022-04-27 MED ORDER — ISOSORBIDE MONONITRATE ER 60 MG PO TB24
60.0000 mg | ORAL_TABLET | Freq: Every day | ORAL | Status: DC
  Administered 2022-04-27 – 2022-04-28 (×2): 60 mg via ORAL
  Filled 2022-04-27 (×2): qty 1

## 2022-04-27 MED ORDER — PANTOPRAZOLE SODIUM 40 MG PO TBEC
40.0000 mg | DELAYED_RELEASE_TABLET | Freq: Two times a day (BID) | ORAL | Status: DC
  Administered 2022-04-27 – 2022-04-28 (×2): 40 mg via ORAL
  Filled 2022-04-27 (×2): qty 1

## 2022-04-27 MED ORDER — AMLODIPINE BESYLATE 5 MG PO TABS
5.0000 mg | ORAL_TABLET | Freq: Every day | ORAL | Status: DC
  Administered 2022-04-27 – 2022-04-28 (×2): 5 mg via ORAL
  Filled 2022-04-27 (×2): qty 1

## 2022-04-27 MED FILL — Heparin Sod (Porcine)-NaCl IV Soln 1000 Unit/500ML-0.9%: INTRAVENOUS | Qty: 1000 | Status: AC

## 2022-04-27 NOTE — Consult Note (Signed)
Consultation Note Date: 04/27/2022   Patient Name: [name] [name]  DOB: 1935/06/20  MRN: [id number]  Age / Sex: 86 y.o., female  PCP: [name] [name] (Inactive) Referring Physician: Jettie Booze, MD  Reason for Consultation: Establishing goals of care  HPI/Patient Profile: 86 y.o. female  with past medical history of CAD, HFpEF, HTN, HLD, pericardial effusion, aortic stenosis, sleep apnea, CKD stage 3, IBS, pancreatitis, osteoporosis, Rheumatoid arthritis, PUD, Hep B, Hep C, depression, anxiety admitted on 04/26/2022 with chest pain ongoing for months. Palliative care consultation requested for goals of care due to severe CAD with no options for revascularization.   Clinical Assessment and Goals of Care: I received palliative consultation and performed thorough chart review. Noted increasing symptoms and work up from outpatient GI and now cardiology. Concern for severe CAD with limited options. I spoke with [name] Bellis, NP who has spoken with patient and family about palliative care and prompted the importance of making some difficult decisions and planning for the future.   I met today with [name] [name], daughter [name] [name], and husband [name] [name]. Husband was present but does not participate in conversation secondary to dementia. [name] [name] is lying in bed. She has good understanding of her condition and expresses that she is a woman of faith and understands that we are not made to live together. She shares that she has found peace and has made the decision not to worry about her health and prognosis and to trust God to take her when it is her time. She is interested in living her life to the fullest and enjoying the time she has left. She enjoys watching her soap operas daily from 12-2p - daughter shares how they know not to call or bother her during these times! Daughter and son-in-law live in the home  to help support [name] [name] and her husband as well. 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   We further discussed [name] [name] wishes and after discussion she elects DNR. She does not want to be prolonged to exist if she is unable to enjoy her life. She would trust her daughter, [name] [name], to make medical decisions on her behalf. I provided them with HCPOA/Living Will as they are interested in completion and will work to complete at home as I worry that notary/witnesses may not be as available in hospital due to holiday schedule. They are open to outpatient follow up.   We also discussed symptoms and medications. I assured them that they will receive a detailed list outlining medications and prior to discharge. We discussed the importance of her cardiac meds and potassium. She tells me about previous medications that she has chosen to stop and did not want to take. I cautioned her to not stop any medications abruptly without discussing with her provider as this can be dangerous depending on the medication. Cardiology is adjusted her cardiac medications and increasing protonix for potential GI symptoms. She complains of insomnia and this seems likely due to combination  of underlying cardiac/GI symptoms along with stress and anxiety from life stressors. We discussed trial of trazodone for insomnia and she agrees with plan.   All questions/concerns addressed. Emotional support provided.   Primary Decision Maker PATIENT    SUMMARY OF RECOMMENDATIONS   - DNR decided - Advance Directive given - Hopeful for discharge soon  Code Status/Advance Care Planning: DNR   Symptom Management:  Per cardiology.  Insomnia: Trazodone 25 mg qhs.   Prognosis:  Overall prognosis poor with severe CAD.   Discharge Planning: Home with Palliative Services      Primary Diagnoses: Present on Admission:  CAD (coronary artery disease)   I have reviewed the medical  record, interviewed the patient and family, and examined the patient. The following aspects are pertinent.  Past Medical History:  Diagnosis Date   Aortic stenosis    mild by echo in 03/2020   Arthritis    CAD (coronary artery disease)    a. 02/2008 Cath: EF 60%, RCA 100 w/ L->R collats, LM 20d, RI 60-70, small, LAD 60/14m 99d, D1 50, OM1 50. Poor distal targets for CABG/no good interventional target -> medical Rx; b. Myoview in [place] in 3/11 -nl;  c. 07/2011 MV: EF 67%, mild inf ischemia->Med Rx; 02/2015 MV: EF 50%, small, mod intensity apical and apical inf rev defect-->Med Rx.   Chronic cough    hx; ACEI stopped, possible contribution from GERD   Chronic diastolic CHF (congestive heart failure) (HCC)    Echocardiogram 10/21: EF 60-65, no RWMA, mild LVH, Gr 2 DD, normal RVSF, severe LAE, small pericardial effusion, trivial MR, mild AS (mean 11 mmHg)   Depression    Dyspnea    a. 08/2013 Echo: EF 60-65%, mild AS;  b. PFTs (10/10): FVC 103%, ratio 74%. mild obstruction; c. CT chest (6/11) no evidence for interstitial lung dz. possible asthma/upper airway cough sydrome. aspirin & beta blockers- potential causes for bronchospasm;   GERD (gastroesophageal reflux disease)    HBV (hepatitis B virus) infection    HCV (hepatitis C virus)    History of PAC's    hx   History of PVC's    hx   HLD (hyperlipidemia)    unable to tolerate most statins due to myalgias and elevated CK. thinks pravastatin casued a rash. Only tolerates crestor.   Hypertensive heart disease    IBS (irritable bowel syndrome)    Obesity    Osteoarthritis    knee; s/p TKR   Osteoporosis    Pancreatitis    hx   Pericardial effusion    small by echo in 03/2020 // Limited Echocardiogram 1/22: EF 60-65, no RWMA, mod LVH, Gr 1 DD, normal RVSF, mild LAE, mild MR, mild AS mean 8.5 mmHg), no pericardial effusion      PUD (peptic ulcer disease)    Sleep apnea    diagnosed in 2011 , did not want CPAP -    Social History    Socioeconomic History   Marital status: Married    Spouse name: HLynann [name]  Number of children: 4   Years of education: 12   Highest education level: Not on file  Occupational History   Occupation: retired     EFish farm manager RETIRED    Comment: [employer] Tobacco Use   Smoking status: Never   Smokeless tobacco: Never   Tobacco comments:    2nd hand exposure x40 years   Vaping Use   Vaping Use: Never used  Substance and Sexual Activity  Alcohol use: No   Drug use: No   Sexual activity: Not Currently  Other Topics Concern   Not on file  Social History Narrative   Married, lives with husband; has children.   Retired Materials engineer.       Cell # [phone]; okay to leave a message.       Social Determinants of Health   Financial Resource Strain: Not on file  Food Insecurity: No Food Insecurity (04/26/2022)   Hunger Vital Sign    Worried About Running Out of Food in the Last Year: Never true    Ran Out of Food in the Last Year: Never true  Transportation Needs: No Transportation Needs (04/26/2022)   PRAPARE - Hydrologist (Medical): No    Lack of Transportation (Non-Medical): No  Physical Activity: Not on file  Stress: Not on file  Social Connections: Not on file   Family History  Problem Relation Age of Onset   Asthma Mother    Stroke Mother        massive   Arthritis/Rheumatoid Mother    Liver disease Father    Asthma Sister    Stroke Sister    COPD Sister    Emphysema Sister    Colon cancer Maternal Aunt    Lung cancer Maternal Aunt    Pancreatic cancer Brother    Scheduled Meds:  clopidogrel  75 mg Oral Daily   irbesartan  300 mg Oral Daily   isosorbide mononitrate  30 mg Oral Daily   metoCLOPramide  10 mg Oral Daily   mometasone-formoterol  2 puff Inhalation BID   pantoprazole  40 mg Oral Daily   potassium chloride SA  20 mEq Oral Daily   sodium chloride flush  3 mL Intravenous Q12H   [START ON 05/01/2022]  Vitamin D (Ergocalciferol)  50,000 Units Oral Q Sun   Continuous Infusions:  sodium chloride     PRN Meds:.sodium chloride, acetaminophen, albuterol, alum & mag hydroxide-simeth, nitroGLYCERIN, ondansetron (ZOFRAN) IV, sodium chloride flush Allergies  Allergen Reactions   Gabapentin     Other reaction(s): Mental confusion   Aspirin Nausea Only   Clarithromycin Other (See Comments)    unknown   Doxycycline Nausea Only   Levaquin [Levofloxacin In D5w] Rash    Rash, HA   Penicillins Itching and Rash    Other reaction(s): Unknown Has patient had a PCN reaction causing immediate rash, facial/tongue/throat swelling, SOB or lightheadedness with hypotension: No Has patient had a PCN reaction causing severe rash involving mucus membranes or skin necrosis: No Has patient had a PCN reaction that required hospitalization No Has patient had a PCN reaction occurring within the last 10 years: No If all of the above answers are "NO", then may proceed with Cephalosporin use.    Pravastatin Itching    REACTION: itching   Review of Systems  Constitutional:  Negative for activity change and appetite change.  Respiratory:  Negative for chest tightness and shortness of breath.   Psychiatric/Behavioral:  Positive for sleep disturbance. The patient is nervous/anxious.     Physical Exam Vitals and nursing note reviewed.  Constitutional:      General: She is not in acute distress. Cardiovascular:     Rate and Rhythm: Normal rate.  Pulmonary:     Effort: No tachypnea, accessory muscle usage or respiratory distress.  Abdominal:     General: Abdomen is flat.  Neurological:     Mental Status: She is alert and oriented  to person, place, and time.     Vital Signs: BP (!) 130/91 (BP Location: Left Arm)   Pulse 75   Temp 98.7 F (37.1 C) (Oral)   Resp 20   Ht 5' (1.524 m)   Wt 72.8 kg   SpO2 97%   BMI 31.35 kg/m  Pain Scale: 0-10   Pain Score: 0-No pain   SpO2: SpO2: 97 % O2  Device:SpO2: 97 % O2 Flow Rate: .   IO: Intake/output summary:  Intake/Output Summary (Last 24 hours) at 04/27/2022 0902 Last data filed at 04/27/2022 0001 Gross per 24 hour  Intake 1156.8 ml  Output 225 ml  Net 931.8 ml    LBM:   Baseline Weight: Weight: 72.8 kg Most recent weight: Weight: 72.8 kg     Palliative Assessment/Data:     Time In: 1245  Time Total: 80 min  Greater than 50%  of this time was spent counseling and coordinating care related to the above assessment and plan.  Signed by: Vinie Sill, NP Palliative Medicine Team Pager # [phone] (M-F 8a-5p) Team Phone # [phone] (Nights/Weekends)

## 2022-04-27 NOTE — Consult Note (Signed)
   [facility] Inpatient Consult   04/27/2022  Shey [facility] 1935/07/30 [id number]  [facility] [[facility]]  Accountable Care Organization [ACO] Patient: [facility] [employer]  Primary Care Provider:  Jarome Matin (Inactive) is listed and confirmed by patient and family today   Patient screened for hospitalization currently in observation was discussed in unit progression meeting by the team as patient facing stressors and cardiac cath results noted.  This Clinical research associate came by and spoke with patient and daughter Royetta Asal, with husband at bedside as well [not talkative during visit] to assess for potential [facility]  [[facility]] Care Management service needs for post hospital transition for care coordination. Explained [facility] Care Management visit in assessing for post hospital follow up needs.  Daughter states patient has Soil scientist for Care Coordination as well.  Encouraged her to reach out for care coordination needs.  Patient and daughter express stressors and patient is tearful.  Daughter states they're concern for HCPOA and Advanced Directives paper's to be done today and steps for financial POA.  She states that maybe the palliative person can dive deeper into that, and I agreed. However, explained that this also can be followed up in the community.  Plan:  Continue to follow progress and disposition to assess for post hospital community care coordination/management needs are to be managed through [facility].  Referral request for community care coordination: follow up maybe needed for Advanced Directives, will follow  Of note, [facility] Care Management/Population Health does not replace or interfere with any arrangements made by the Inpatient Transition of Care team.  For questions contact:   Charlesetta Shanks, RN BSN CCM [facility]  [phone] business mobile phone Toll free office [phone]  *Concierge Line   [phone] Fax number: [phone] [email or web address]

## 2022-04-27 NOTE — Progress Notes (Signed)
Rounding Note    Patient Name: [name] [name] Date of Encounter: 04/27/2022  [place] Cardiologist: Larae Grooms, MD   Subjective   No chest pain, significant belching/nausea with white phlegm. Family at the bedside.    Inpatient Medications    Scheduled Meds:  clopidogrel  75 mg Oral Daily   irbesartan  300 mg Oral Daily   isosorbide mononitrate  60 mg Oral Daily   metoCLOPramide  10 mg Oral Daily   mometasone-formoterol  2 puff Inhalation BID   pantoprazole  40 mg Oral Daily   potassium chloride SA  20 mEq Oral Daily   sodium chloride flush  3 mL Intravenous Q12H   [START ON 05/01/2022] Vitamin D (Ergocalciferol)  50,000 Units Oral Q Sun   Continuous Infusions:  sodium chloride     PRN Meds: sodium chloride, acetaminophen, albuterol, alum & mag hydroxide-simeth, nitroGLYCERIN, ondansetron (ZOFRAN) IV, sodium chloride flush   Vital Signs    Vitals:   04/27/22 0014 04/27/22 0423 04/27/22 0734 04/27/22 0758  BP: 128/67 (!) 141/75  (!) 130/91  Pulse: 76 72 72 75  Resp: 13 16 20 20   Temp: 98.8 F (37.1 C) 97.8 F (36.6 C)  98.7 F (37.1 C)  TempSrc: Oral Oral  Oral  SpO2: 93% 96% 95% 97%  Weight: 72.8 kg     Height:        Intake/Output Summary (Last 24 hours) at 04/27/2022 0933 Last data filed at 04/27/2022 0001 Gross per 24 hour  Intake 1156.8 ml  Output 225 ml  Net 931.8 ml      04/27/2022   12:14 AM 04/26/2022    2:33 PM 04/26/2022    6:47 AM  Last 3 Weights  Weight (lbs) 160 lb 8 oz 162 lb 0.6 oz 160 lb 6.4 oz  Weight (kg) 72.802 kg 73.5 kg 72.757 kg      Telemetry    Sinus Rhythm - Personally Reviewed  ECG    No new tracing this morning  Physical Exam   GEN: No acute distress.   Neck: No JVD Cardiac: RRR, no murmurs, rubs, or gallops.  Respiratory: Clear to auscultation bilaterally. GI: Soft, nontender, non-distended  MS: No edema; No deformity. Right femoral cath site stable.  Neuro:  Nonfocal  Psych:  Normal affect   Labs    High Sensitivity Troponin:  No results for input(s): "TROPONINIHS" in the last 720 hours.   Chemistry Recent Labs  Lab 04/20/22 1144 04/26/22 0952 04/26/22 1008 04/26/22 1011 04/26/22 1016  NA 142   < > 144 144 145  K 3.9   < > 3.3* 3.3* 3.2*  CL 108*  --   --   --   --   CO2 24  --   --   --   --   GLUCOSE 94  --   --   --   --   BUN 19  --   --   --   --   CREATININE 0.99  --   --   --   --   CALCIUM 9.2  --   --   --   --    < > = values in this interval not displayed.    Lipids No results for input(s): "CHOL", "TRIG", "HDL", "LABVLDL", "LDLCALC", "CHOLHDL" in the last 168 hours.  Hematology Recent Labs  Lab 04/22/22 1232 04/26/22 0952 04/26/22 1008 04/26/22 1011 04/26/22 1016  WBC 7.9  --   --   --   --  RBC 4.03  --   --   --   --   HGB 11.8   < > 10.9* 11.6* 10.5*  HCT 35.1   < > 32.0* 34.0* 31.0*  MCV 87  --   --   --   --   MCH 29.3  --   --   --   --   MCHC 33.6  --   --   --   --   RDW 14.5  --   --   --   --   PLT 200  --   --   --   --    < > = values in this interval not displayed.   Thyroid No results for input(s): "TSH", "FREET4" in the last 168 hours.  BNP Recent Labs  Lab 04/20/22 1144  PROBNP 1,771*    DDimer No results for input(s): "DDIMER" in the last 168 hours.   Radiology    CARDIAC CATHETERIZATION  Addendum Date: 04/26/2022     Mid LM to Dist LM lesion is 90% stenosed.   Ost Cx to Prox Cx lesion is 75% stenosed.   1st Mrg lesion is 70% stenosed.   Prox LAD to Mid LAD lesion is 90% stenosed.   Dist LAD lesion is 100% stenosed.   Mid RCA lesion is 100% stenosed.   LV end diastolic pressure is normal.  LVEDP 15 mmHg.   There is no aortic valve stenosis.   Severe, near continuous belching during the case while lying flat.   Aortic saturation 88%, PA saturation 67%, PA pressure 25/11, mean PA pressure 18 mmHg, mean pulmonary capillary wedge pressure 17 mmHg, cardiac output 6.72 L/min, cardiac index 3.95.  Mean right  atrial pressure 5 mmHg.   Essentially normal right heart pressures after recent diuresis due to elevated BNP. Severe, three-vessel coronary artery disease.  Significant progression of her CAD compared to her 2009 catheterization. I do not think she would be a surgical candidate due to overall frailty and age.  No good PCI options.  Results conveyed by phone to the daughter. Of note, she was having significant reflux, belching during the procedure while lying flat.  She was treated with Zofran.  In talking to her daughter, this is her biggest complaint.  She cannot sleep at night.  She has to sit up to avoid these symptoms.  Her next issue is when she tries to exert, she has chest discomfort.  This is more cardiac pain.  Will discuss with interventional colleagues but I do not see any targets for PCI given the diffuse nature of her severe CAD.  Would consider palliative care. Will watch overnight in the hospital to help with medicine titration and possible palliative care consult.  Addendum Date: 04/26/2022     Mid LM to Dist LM lesion is 90% stenosed.   Ost Cx to Prox Cx lesion is 75% stenosed.   1st Mrg lesion is 70% stenosed.   Prox LAD to Mid LAD lesion is 90% stenosed.   Dist LAD lesion is 100% stenosed.   Mid RCA lesion is 100% stenosed.   LV end diastolic pressure is normal.  LVEDP 15 mmHg.   There is no aortic valve stenosis.   Severe, near continuous belching during the case while lying flat.   Aortic saturation 88%, PA saturation 67%, PA pressure 25/11, mean PA pressure 18 mmHg, mean pulmonary capillary wedge pressure 17 mmHg, cardiac output 6.72 L/min, cardiac index 3.95.  Mean right atrial pressure  5 mmHg.   Essentially normal right heart pressures after recent diuresis due to elevated BNP. Severe, three-vessel coronary artery disease.  Significant progression of her CAD compared to her 2009 catheterization. I do not think she would be a surgical candidate due to overall frailty and age.  No good PCI  options.  Results conveyed by phone to the daughter. Of note, she was having significant reflux, belching during the procedure while lying flat.  She was treated with Zofran.  In talking to her daughter, this is her biggest complaint.  She cannot sleep at night.  She has to sit up to avoid these symptoms.  Her next issue is when she tries to exert, she has chest discomfort.  This is more cardiac pain.  Will discuss with interventional colleagues but I do not see any targets for PCI given the diffuse nature of her severe CAD.  Would consider palliative care. Will watch overnight in the hospital to help with medicine titration and possible palliative care consult.  Addendum Date: 04/26/2022     Mid LM to Dist LM lesion is 90% stenosed.   Ost Cx to Prox Cx lesion is 75% stenosed.   1st Mrg lesion is 70% stenosed.   Prox LAD to Mid LAD lesion is 90% stenosed.   Dist LAD lesion is 100% stenosed.   Mid RCA lesion is 100% stenosed.   LV end diastolic pressure is normal.  LVEDP 15 mmHg.   There is no aortic valve stenosis.   Severe, near continuous belching during the case while lying flat.   Aortic saturation 88%, PA saturation 67%, PA pressure 25/11, mean PA pressure 18 mmHg, mean pulmonary capillary wedge pressure 17 mmHg, cardiac output 6.72 L/min, cardiac index 3.95.  Mean right atrial pressure 5 mmHg.   Essentially normal right heart pressures after recent diuresis due to elevated BNP. Severe, three-vessel coronary artery disease.  Significant progression of her CAD compared to her 2009 catheterization. I do not think she would be a surgical candidate due to overall frailty and age.  No good PCI options.  Results conveyed by phone to the daughter. Of note, she was having significant reflux, belching during the procedure while lying flat.  She was treated with Zofran.  In talking to her daughter, this is her biggest complaint.  She cannot sleep at night.  She has to sit up to avoid these symptoms.  Her next issue is  when she tries to exert, she has chest discomfort.  This is more cardiac pain.  Will discuss with interventional colleagues but I do not see any targets for PCI given the diffuse nature of her severe CAD.  Would consider palliative care. Will watch overnight in the hospital to help with medicine titration and possible palliative care consult.  Result Date: 04/26/2022   Mid LM to Dist LM lesion is 90% stenosed.   Ost Cx to Prox Cx lesion is 75% stenosed.   1st Mrg lesion is 70% stenosed.   Prox LAD to Mid LAD lesion is 90% stenosed.   Dist LAD lesion is 100% stenosed.   Mid RCA lesion is 100% stenosed.   LV end diastolic pressure is normal.   There is no aortic valve stenosis.   Severe, near continuous belching during the case while lying flat. Severe, three-vessel coronary artery disease.  Significant progression of her CAD compared to her 2009 catheterization. I do not think she would be a surgical candidate due to overall frailty and age.  No good  PCI options.  Results conveyed by phone to the daughter. Of note, she was having significant reflux, belching during the procedure while lying flat.  She was treated with Zofran.  In talking to her daughter, this is her biggest complaint.  She cannot sleep at night.  She has to sit up to avoid these symptoms.  Her next issue is when she tries to exert, she has chest discomfort.  This is more cardiac pain.  Will discuss with interventional colleagues but I do not see any targets for PCI given the diffuse nature of her severe CAD.  Would consider palliative care. Will watch overnight in the hospital to help with medicine titration and possible palliative care consult.    Cardiac Studies   Cath: 04/26/22    Mid LM to Dist LM lesion is 90% stenosed.   Ost Cx to Prox Cx lesion is 75% stenosed.   1st Mrg lesion is 70% stenosed.   Prox LAD to Mid LAD lesion is 90% stenosed.   Dist LAD lesion is 100% stenosed.   Mid RCA lesion is 100% stenosed.   LV end diastolic  pressure is normal.  LVEDP 15 mmHg.   There is no aortic valve stenosis.   Severe, near continuous belching during the case while lying flat.   Aortic saturation 88%, PA saturation 67%, PA pressure 25/11, mean PA pressure 18 mmHg, mean pulmonary capillary wedge pressure 17 mmHg, cardiac output 6.72 L/min, cardiac index 3.95.  Mean right atrial pressure 5 mmHg.   Essentially normal right heart pressures after recent diuresis due to elevated BNP.   Severe, three-vessel coronary artery disease.  Significant progression of her CAD compared to her 2009 catheterization.   I do not think she would be a surgical candidate due to overall frailty and age.  No good PCI options.  Results conveyed by phone to the daughter.   Of note, she was having significant reflux, belching during the procedure while lying flat.  She was treated with Zofran.  In talking to her daughter, this is her biggest complaint.  She cannot sleep at night.  She has to sit up to avoid these symptoms.   Her next issue is when she tries to exert, she has chest discomfort.  This is more cardiac pain.  Will discuss with interventional colleagues but I do not see any targets for PCI given the diffuse nature of her severe CAD.  Would consider palliative care.   Will watch overnight in the hospital to help with medicine titration and possible palliative care consult.   Diagnostic Dominance: Right   Patient Profile     86 y.o. female with PMH of CAD, moderate AS, MR, HFpEF, HTN, PUD, GERD, HLD, RA who presented to the office on 11/17 and set up for outpatient cardiac cath.   Assessment & Plan    CAD -- Underwent outpatient cardiac catheterization 11/21 noted above with severe three-vessel CAD, significant progression since prior catheterization in 2009.  90% mid/distal left main, 75% ostial/proximal circumflex, 70% OM1, 90% proximal/mid LAD, 100% distal LAD, CTO of mid RCA.  Unfortunately no good PCI targets in not felt to be a surgical  candidate given her overall frailty and advanced age.  Recommendations for continued medical management. -- Continue Plavix, increase Imdur to 60 mg daily, Norvasc 5 mg daily -- Palliative care consulted to assist with goals of care  Belching GERD PUD -- She reports significant GI symptoms with ongoing belching, reflux and white phlegm.  This is her  main complaint. -- Has been on Protonix 40 mg daily as well as Reglan 10 mg daily without significant relief -- will review with pharmD inpt formulary options  Hypertension -- Continue irbesartan 300 mg daily, resume home Norvasc 5 mg daily. Add metoprolol 25mg  BID  Hypokalemia -- suppl -- check BMET  Moderate Aortic Stenosis Mild to moderate MR   Long discussion with family and pt at the bedside. She is under significant amount of stress caring for her husband at home who has advanced dementia. Will consult case management.   For questions or updates, please contact [place] Please consult [email or web address] for contact info under        Signed, Reino Bellis, NP  04/27/2022, 9:33 AM

## 2022-04-27 NOTE — Progress Notes (Signed)
   04/27/22 1100  Mobility  Activity Ambulated with assistance in hallway  Level of Assistance Contact guard assist, steadying assist  Assistive Device None  Distance Ambulated (ft) 550 ft  Activity Response Tolerated well  Mobility Referral Yes  $Mobility charge 1 Mobility   Mobility Specialist Progress Note  Received pt in EOB having no complaints and agreeable to mobility. Pt was asymptomatic throughout ambulation and returned to room w/o fault. Left in EOB w/ call bell in reach and all needs met.   Lucious Groves Mobility Specialist

## 2022-04-28 DIAGNOSIS — Z79899 Other long term (current) drug therapy: Secondary | ICD-10-CM | POA: Diagnosis not present

## 2022-04-28 DIAGNOSIS — Z823 Family history of stroke: Secondary | ICD-10-CM | POA: Diagnosis not present

## 2022-04-28 DIAGNOSIS — I10 Essential (primary) hypertension: Secondary | ICD-10-CM | POA: Diagnosis not present

## 2022-04-28 DIAGNOSIS — G4700 Insomnia, unspecified: Secondary | ICD-10-CM | POA: Diagnosis present

## 2022-04-28 DIAGNOSIS — Z7902 Long term (current) use of antithrombotics/antiplatelets: Secondary | ICD-10-CM | POA: Diagnosis not present

## 2022-04-28 DIAGNOSIS — Z7951 Long term (current) use of inhaled steroids: Secondary | ICD-10-CM | POA: Diagnosis not present

## 2022-04-28 DIAGNOSIS — E782 Mixed hyperlipidemia: Secondary | ICD-10-CM

## 2022-04-28 DIAGNOSIS — Z880 Allergy status to penicillin: Secondary | ICD-10-CM | POA: Diagnosis not present

## 2022-04-28 DIAGNOSIS — I350 Nonrheumatic aortic (valve) stenosis: Secondary | ICD-10-CM | POA: Diagnosis present

## 2022-04-28 DIAGNOSIS — Z800 Family history of malignant neoplasm of digestive organs: Secondary | ICD-10-CM | POA: Diagnosis not present

## 2022-04-28 DIAGNOSIS — K219 Gastro-esophageal reflux disease without esophagitis: Secondary | ICD-10-CM

## 2022-04-28 DIAGNOSIS — K589 Irritable bowel syndrome without diarrhea: Secondary | ICD-10-CM | POA: Diagnosis present

## 2022-04-28 DIAGNOSIS — E876 Hypokalemia: Secondary | ICD-10-CM

## 2022-04-28 DIAGNOSIS — N183 Chronic kidney disease, stage 3 unspecified: Secondary | ICD-10-CM | POA: Diagnosis present

## 2022-04-28 DIAGNOSIS — M810 Age-related osteoporosis without current pathological fracture: Secondary | ICD-10-CM | POA: Diagnosis present

## 2022-04-28 DIAGNOSIS — Z801 Family history of malignant neoplasm of trachea, bronchus and lung: Secondary | ICD-10-CM | POA: Diagnosis not present

## 2022-04-28 DIAGNOSIS — Z7189 Other specified counseling: Secondary | ICD-10-CM | POA: Diagnosis not present

## 2022-04-28 DIAGNOSIS — Z825 Family history of asthma and other chronic lower respiratory diseases: Secondary | ICD-10-CM | POA: Diagnosis not present

## 2022-04-28 DIAGNOSIS — Z515 Encounter for palliative care: Secondary | ICD-10-CM | POA: Diagnosis not present

## 2022-04-28 DIAGNOSIS — I25119 Atherosclerotic heart disease of native coronary artery with unspecified angina pectoris: Secondary | ICD-10-CM | POA: Diagnosis present

## 2022-04-28 DIAGNOSIS — Z789 Other specified health status: Secondary | ICD-10-CM

## 2022-04-28 DIAGNOSIS — R0789 Other chest pain: Secondary | ICD-10-CM | POA: Diagnosis present

## 2022-04-28 DIAGNOSIS — M069 Rheumatoid arthritis, unspecified: Secondary | ICD-10-CM | POA: Diagnosis present

## 2022-04-28 DIAGNOSIS — Z66 Do not resuscitate: Secondary | ICD-10-CM | POA: Diagnosis not present

## 2022-04-28 DIAGNOSIS — E669 Obesity, unspecified: Secondary | ICD-10-CM | POA: Diagnosis present

## 2022-04-28 DIAGNOSIS — I130 Hypertensive heart and chronic kidney disease with heart failure and stage 1 through stage 4 chronic kidney disease, or unspecified chronic kidney disease: Secondary | ICD-10-CM | POA: Diagnosis present

## 2022-04-28 DIAGNOSIS — Z886 Allergy status to analgesic agent status: Secondary | ICD-10-CM | POA: Diagnosis not present

## 2022-04-28 DIAGNOSIS — E785 Hyperlipidemia, unspecified: Secondary | ICD-10-CM | POA: Diagnosis present

## 2022-04-28 DIAGNOSIS — F32A Depression, unspecified: Secondary | ICD-10-CM | POA: Diagnosis present

## 2022-04-28 DIAGNOSIS — I5032 Chronic diastolic (congestive) heart failure: Secondary | ICD-10-CM | POA: Diagnosis present

## 2022-04-28 DIAGNOSIS — I25118 Atherosclerotic heart disease of native coronary artery with other forms of angina pectoris: Secondary | ICD-10-CM | POA: Diagnosis not present

## 2022-04-28 LAB — LIPOPROTEIN A (LPA): Lipoprotein (a): 38.2 nmol/L — ABNORMAL HIGH (ref <75.0)

## 2022-04-28 MED ORDER — SENNA 8.6 MG PO TABS: 1.0000 | ORAL_TABLET | Freq: Every day | ORAL | Status: DC | PRN

## 2022-04-28 MED ORDER — ISOSORBIDE MONONITRATE ER 60 MG PO TB24: 60.0000 mg | ORAL_TABLET | Freq: Every day | ORAL | 1 refills | Status: DC

## 2022-04-28 MED ORDER — PANTOPRAZOLE SODIUM 40 MG PO TBEC: 40.0000 mg | DELAYED_RELEASE_TABLET | Freq: Two times a day (BID) | ORAL | 1 refills | Status: AC

## 2022-04-28 MED ORDER — FAMOTIDINE 20 MG PO TABS: 20.0000 mg | ORAL_TABLET | Freq: Every day | ORAL | 1 refills | Status: AC

## 2022-04-28 MED ORDER — METOPROLOL TARTRATE 25 MG PO TABS: 25.0000 mg | ORAL_TABLET | Freq: Two times a day (BID) | ORAL | 1 refills | Status: DC

## 2022-04-28 MED ORDER — FUROSEMIDE 40 MG PO TABS: 40.0000 mg | ORAL_TABLET | Freq: Every day | ORAL | 1 refills | Status: DC

## 2022-04-28 MED ORDER — LINACLOTIDE 145 MCG PO CAPS: 145.0000 ug | ORAL_CAPSULE | Freq: Every day | ORAL | Status: DC | PRN

## 2022-04-28 MED ORDER — SENNA 8.6 MG PO TABS: 2.0000 | ORAL_TABLET | Freq: Once | ORAL | Status: DC

## 2022-04-28 MED ORDER — TRAZODONE HCL 50 MG PO TABS: 25.0000 mg | ORAL_TABLET | Freq: Every day | ORAL | 1 refills | Status: DC

## 2022-04-28 NOTE — Progress Notes (Signed)
Palliative:  HPI: 86 y.o. female  with past medical history of CAD, HFpEF, HTN, HLD, pericardial effusion, aortic stenosis, sleep apnea, CKD stage 3, IBS, pancreatitis, osteoporosis, Rheumatoid arthritis, PUD, Hep B, Hep C, depression, anxiety admitted on 04/26/2022 with chest pain ongoing for months. Palliative care consultation requested for goals of care due to severe CAD with no options for revascularization.     I met today again with [name] [name] and her son [name] [name] is at bedside. [name] [name] reports that she slept very well last night and wishes to continue trazodone at home. She is excited to be going home today. They were able to get daughter [name] [name] on phone and we discussed plan for home. Educated to continue to complete Advance Directive at home but no rush as there are no notary/witnesses available to complete on the holiday. Encouraged that outpatient palliative, doctor visit, or even the bank can assist to notarize. Explained that cardiology service will work on discharge summary and when completed RN will review recommendations and medications with them and the medical team will be available as needed for any questions or concerns. [name] [name] also complains of constipation and reports she takes Linzess as needed to help with this - noted this was d/c and restarted after discussing with cardiology.   All questions/concerns addressed. Emotional support provided.   Exam: Alert, oriented. No distress. Breathing regular, unlabored. Abd soft. Moves all extremities.   Plan: - DNR decided yesterday.  - D/C home today. Recommend outpatient palliative services.   [place], NP Palliative Medicine Team Pager [phone] (Please see [email or web address] for schedule) Team Phone [phone]    Greater than 50%  of this time was spent counseling and coordinating care related to the above assessment and plan

## 2022-04-28 NOTE — Discharge Summary (Addendum)
Discharge Summary    Patient ID: [name] [name] MRN: [id number]; DOB: 1936-02-16  Admit date: 04/26/2022 Discharge date: 04/28/2022  PCP:  Jarome Matin (Inactive)   [facility] Providers Cardiologist:  Lance Muss, MD        Discharge Diagnoses    Principal Problem:   CAD (coronary artery disease) Active Problems:   Hyperlipidemia   Hypertension   Moderate aortic stenosis   Diagnostic Studies/Procedures    Cardiac Catheterization: 04/26/2022 Mid LM to Dist LM lesion is 90% stenosed.   Ost Cx to Prox Cx lesion is 75% stenosed.   1st Mrg lesion is 70% stenosed.   Prox LAD to Mid LAD lesion is 90% stenosed.   Dist LAD lesion is 100% stenosed.   Mid RCA lesion is 100% stenosed.   LV end diastolic pressure is normal.  LVEDP 15 mmHg.   There is no aortic valve stenosis.   Severe, near continuous belching during the case while lying flat.   Aortic saturation 88%, PA saturation 67%, PA pressure 25/11, mean PA pressure 18 mmHg, mean pulmonary capillary wedge pressure 17 mmHg, cardiac output 6.72 L/min, cardiac index 3.95.  Mean right atrial pressure 5 mmHg.   Essentially normal right heart pressures after recent diuresis due to elevated BNP.   Severe, three-vessel coronary artery disease.  Significant progression of her CAD compared to her 2009 catheterization.   I do not think she would be a surgical candidate due to overall frailty and age.  No good PCI options.  Results conveyed by phone to the daughter.   Of note, she was having significant reflux, belching during the procedure while lying flat.  She was treated with Zofran.  In talking to her daughter, this is her biggest complaint.  She cannot sleep at night.  She has to sit up to avoid these symptoms.   Her next issue is when she tries to exert, she has chest discomfort.  This is more cardiac pain.  Will discuss with interventional colleagues but I do not see any targets for PCI given the diffuse  nature of her severe CAD.  Would consider palliative care.   Will watch overnight in the hospital to help with medicine titration and possible palliative care consult.  History of Present Illness     [name] [name] is a 86 y.o. female with past medical history of CAD (s/p multivessel CAD by prior cath in 2009 and medically managed given no PCI options and poor targets for CABG), HTN, HLD, OSA, IBS and AS who presented to [facility] on 04/26/2022 for a planned outpatient cardiac catheterization.   She was examined by Tereso Newcomer, PA on 04/22/2022 and reported worsening chest discomfort for the past 6 to 7 months which would radiate into her back. This could occur at rest or with activity and she did report being under more stress as she was helping to take care of her husband who has dementia. Given that she did have symptoms concerning for exertional angina with walking to her mailbox, a follow-up cardiac catheterization was recommended for evaluation.  Hospital Course     Consultants: None   She underwent catheterization by Dr. Eldridge Dace on 04/26/2022 which showed severe three-vessel CAD with significant progression of her CAD as compared to prior catheterization in 2009. Findings included 90% mid to distal left main stenosis, 75% ostial LCx stenosis, 70% first marginal stenosis, 90% proximal LAD stenosis, 100% distal LAD stenosis and 100% mid RCA stenosis. It was not felt  she would be a surgical candidate due to her overall frailty and age and there were no good options for PCI. She was monitored overnight and Palliative Care was consulted the following morning and she did decide to pursue DNR status. Given reports of insomnia, she was started on Trazodone 25 mg nightly and Palliative Care recommended continuing with this at the time of discharge. She was continued on Plavix and Amlodipine with Imdur being increased to 60mg  daily and starting Lopressor 25mg  BID. Protonix was also increased to  BID dosing and she was restarted on Pepcid.   On 04/28/2022, she was examined by Dr. and deemed stable for discharge. Reported her belching had significantly improved with adjustment of PPI therapy. She does have previously scheduled follow-up with Cardiology on 05/10/2022 and will keep that appointment.   _____________  Discharge Vitals Blood pressure 120/83, pulse 68, temperature 98.5 F (36.9 C), temperature source Oral, resp. rate 18, height 5' (1.524 m), weight 72.3 kg, SpO2 97 %.  Filed Weights   04/26/22 1433 04/27/22 0014 04/28/22 0526  Weight: 73.5 kg 72.8 kg 72.3 kg    Labs & Radiologic Studies    CBC Recent Labs    04/26/22 1011 04/26/22 1016  HGB 11.6* 10.5*  HCT 34.0* 31.0*   Basic Metabolic Panel Recent Labs    04/28/22 1016 04/27/22 1110  NA 145 141  K 3.2* 3.6  CL  --  114*  CO2  --  22  GLUCOSE  --  119*  BUN  --  13  CREATININE  --  1.16*  CALCIUM  --  8.6*   Liver Function Tests No results for input(s): "AST", "ALT", "ALKPHOS", "BILITOT", "PROT", "ALBUMIN" in the last 72 hours. No results for input(s): "LIPASE", "AMYLASE" in the last 72 hours. High Sensitivity Troponin:   No results for input(s): "TROPONINIHS" in the last 720 hours.  BNP Invalid input(s): "POCBNP" D-Dimer No results for input(s): "DDIMER" in the last 72 hours. Hemoglobin A1C No results for input(s): "HGBA1C" in the last 72 hours. Fasting Lipid Panel No results for input(s): "CHOL", "HDL", "LDLCALC", "TRIG", "CHOLHDL", "LDLDIRECT" in the last 72 hours. Thyroid Function Tests No results for input(s): "TSH", "T4TOTAL", "T3FREE", "THYROIDAB" in the last 72 hours.  Invalid input(s): "FREET3" _____________  Disposition   Pt is being discharged home today in good condition.  Follow-up Plans & Appointments     Follow-up Information     74/08/14, PA-C Follow up.   Specialties: Cardiology, Physician Assistant Why: Keep scheduled Cardiology follow-up on  05/10/2022 at 2:20 PM. Contact information: [address] [place] [address] [place] [phone]                Discharge Instructions     Discharge instructions   Complete by: As directed    PLEASE REMEMBER TO BRING ALL OF YOUR MEDICATIONS TO EACH OF YOUR FOLLOW-UP OFFICE VISITS.  PLEASE ATTEND ALL SCHEDULED FOLLOW-UP APPOINTMENTS.   Activity: Increase activity slowly as tolerated. You may shower, but no soaking baths (or swimming) for 1 week. No driving for 24 hours. No lifting over 5 lbs for 1 week. No sexual activity for 1 week.    Wound Care: You may wash cath site gently with soap and water. Keep cath site clean and dry. If you notice pain, swelling, bleeding or pus at your cath site, please call [phone].   Increase activity slowly   Complete by: As directed  Discharge Medications   Allergies as of 04/28/2022       Reactions   Gabapentin    Other reaction(s): Mental confusion   Aspirin Nausea Only   Clarithromycin Other (See Comments)   unknown   Doxycycline Nausea Only   Levaquin [levofloxacin In D5w] Rash   Rash, HA   Penicillins Itching, Rash   Other reaction(s): Unknown Has patient had a PCN reaction causing immediate rash, facial/tongue/throat swelling, SOB or lightheadedness with hypotension: No Has patient had a PCN reaction causing severe rash involving mucus membranes or skin necrosis: No Has patient had a PCN reaction that required hospitalization No Has patient had a PCN reaction occurring within the last 10 years: No If all of the above answers are "NO", then may proceed with Cephalosporin use.   Pravastatin Itching   REACTION: itching        Medication List     TAKE these medications    acetaminophen 500 MG tablet Commonly known as: TYLENOL Take 1,000 mg by mouth every 6 (six) hours as needed for moderate pain or headache.   albuterol (2.5 MG/3ML) 0.083% nebulizer solution Commonly known as:  PROVENTIL Take 3 mLs (2.5 mg total) by nebulization every 6 (six) hours as needed for wheezing or shortness of breath.   amLODipine 5 MG tablet Commonly known as: NORVASC Take 1 tablet (5 mg total) by mouth daily.   budesonide-formoterol 160-4.5 MCG/ACT inhaler Commonly known as: Symbicort Inhale 2 puffs into the lungs 2 (two) times daily as needed.   clopidogrel 75 MG tablet Commonly known as: PLAVIX TAKE 1 TABLET BY MOUTH EVERY DAY   famotidine 20 MG tablet Commonly known as: PEPCID Take 1 tablet (20 mg total) by mouth daily. Start taking on: April 29, 2022   furosemide 40 MG tablet Commonly known as: LASIX Take 1 tablet (40 mg total) by mouth daily. What changed: See the new instructions.   isosorbide mononitrate 60 MG 24 hr tablet Commonly known as: IMDUR Take 1 tablet (60 mg total) by mouth daily. What changed:  medication strength how much to take   Linzess 145 MCG Caps capsule Generic drug: linaclotide Take 145 mcg by mouth as needed (constipation).   metoCLOPramide 10 MG tablet Commonly known as: REGLAN Take 10 mg by mouth daily.   metoprolol tartrate 25 MG tablet Commonly known as: LOPRESSOR Take 1 tablet (25 mg total) by mouth 2 (two) times daily.   nitroGLYCERIN 0.4 MG SL tablet Commonly known as: NITROSTAT PLACE 1 TABLET UNDER THE TONGUE EVERY 5 MINUTES AS NEEDED.   pantoprazole 40 MG tablet Commonly known as: PROTONIX Take 1 tablet (40 mg total) by mouth 2 (two) times daily. What changed: when to take this   potassium chloride SA 20 MEQ tablet Commonly known as: KLOR-CON M Take 20 mEq by mouth daily.   telmisartan 80 MG tablet Commonly known as: MICARDIS Take 80 mg by mouth daily.   traZODone 50 MG tablet Commonly known as: DESYREL Take 0.5 tablets (25 mg total) by mouth at bedtime.   Vitamin D (Ergocalciferol) 1.25 MG (50000 UNIT) Caps capsule Commonly known as: DRISDOL Take 50,000 Units by mouth every Sunday.            Outstanding Labs/Studies   None  Duration of Discharge Encounter   Greater than 30 minutes including physician time.  Signed, Ellsworth Lennox, PA-C 04/28/2022, 11:23 AM   I have examined the patient and reviewed assessment and plan and discussed with patient.  Agree with  above as stated.    CAD: Severe three vessel CAD.  No options for PCI.  Not a surgical candidate.  Continue medical therapy.  No cardiac sx at rest.     GI: Biggest complaint has been constant belching, most bothersome at night as she cannot lie down to sleep.  She has to sit up.  Continue protonix.  Belching much improved this AM. COntinue current meds.   HTN: The current medical regimen is effective;  continue present plan and medications.    Hypokalemia: Continue ARB.  Improved on yesterdays reading. COntinue eating potassium rich foods.  Normal renal function.    OK for discharge later today.   20 minutes spent discussing plan with patient and palliative care.  Voicemail left for daughter as well.   Lance Muss

## 2022-04-28 NOTE — Progress Notes (Signed)
Rounding Note    Patient Name: [name] [name] Date of Encounter: 04/28/2022  [place] Cardiologist: Larae Grooms, MD   Subjective   Spoke with palliative care yesterday.  Belching much improved this AM.  SHe wants to go home.   Inpatient Medications    Scheduled Meds:  amLODipine  5 mg Oral Daily   clopidogrel  75 mg Oral Daily   famotidine  20 mg Oral Daily   irbesartan  300 mg Oral Daily   isosorbide mononitrate  60 mg Oral Daily   metoCLOPramide  10 mg Oral Daily   metoprolol tartrate  25 mg Oral BID   mometasone-formoterol  2 puff Inhalation BID   pantoprazole  40 mg Oral BID   potassium chloride SA  20 mEq Oral Daily   sodium chloride flush  3 mL Intravenous Q12H   traZODone  25 mg Oral QHS   [START ON 05/01/2022] Vitamin D (Ergocalciferol)  50,000 Units Oral Q Sun   Continuous Infusions:  sodium chloride     PRN Meds: sodium chloride, acetaminophen, albuterol, alum & mag hydroxide-simeth, nitroGLYCERIN, ondansetron (ZOFRAN) IV, sodium chloride flush   Vital Signs    Vitals:   04/27/22 1900 04/27/22 1949 04/28/22 0526 04/28/22 0527  BP: 127/77   120/83  Pulse: 72   68  Resp: 20   18  Temp: 99.3 F (37.4 C)   98.5 F (36.9 C)  TempSrc: Oral   Oral  SpO2: 96% 95%  97%  Weight:   72.3 kg   Height:        Intake/Output Summary (Last 24 hours) at 04/28/2022 0844 Last data filed at 04/28/2022 0747 Gross per 24 hour  Intake 1323 ml  Output 700 ml  Net 623 ml      04/28/2022    5:26 AM 04/27/2022   12:14 AM 04/26/2022    2:33 PM  Last 3 Weights  Weight (lbs) 159 lb 8 oz 160 lb 8 oz 162 lb 0.6 oz  Weight (kg) 72.349 kg 72.802 kg 73.5 kg      Telemetry    NSR - Personally Reviewed  ECG      Physical Exam   GEN: No acute distress.   Neck: No JVD Cardiac: RRR, no murmurs, rubs, or gallops.  Respiratory: Clear to auscultation bilaterally. GI: Soft, nontender, non-distended  MS: No edema; No deformity. Right  radial without hematoma Neuro:  Nonfocal  Psych: Normal affect   Labs    High Sensitivity Troponin:  No results for input(s): "TROPONINIHS" in the last 720 hours.   Chemistry Recent Labs  Lab 04/26/22 1011 04/26/22 1016 04/27/22 1110  NA 144 145 141  K 3.3* 3.2* 3.6  CL  --   --  114*  CO2  --   --  22  GLUCOSE  --   --  119*  BUN  --   --  13  CREATININE  --   --  1.16*  CALCIUM  --   --  8.6*  GFRNONAA  --   --  46*  ANIONGAP  --   --  5    Lipids No results for input(s): "CHOL", "TRIG", "HDL", "LABVLDL", "LDLCALC", "CHOLHDL" in the last 168 hours.  Hematology Recent Labs  Lab 04/22/22 1232 04/26/22 0952 04/26/22 1008 04/26/22 1011 04/26/22 1016  WBC 7.9  --   --   --   --   RBC 4.03  --   --   --   --  HGB 11.8   < > 10.9* 11.6* 10.5*  HCT 35.1   < > 32.0* 34.0* 31.0*  MCV 87  --   --   --   --   MCH 29.3  --   --   --   --   MCHC 33.6  --   --   --   --   RDW 14.5  --   --   --   --   PLT 200  --   --   --   --    < > = values in this interval not displayed.   Thyroid No results for input(s): "TSH", "FREET4" in the last 168 hours.  BNPNo results for input(s): "BNP", "PROBNP" in the last 168 hours.  DDimer No results for input(s): "DDIMER" in the last 168 hours.   Radiology    CARDIAC CATHETERIZATION  Addendum Date: 04/26/2022     Mid LM to Dist LM lesion is 90% stenosed.   Ost Cx to Prox Cx lesion is 75% stenosed.   1st Mrg lesion is 70% stenosed.   Prox LAD to Mid LAD lesion is 90% stenosed.   Dist LAD lesion is 100% stenosed.   Mid RCA lesion is 100% stenosed.   LV end diastolic pressure is normal.  LVEDP 15 mmHg.   There is no aortic valve stenosis.   Severe, near continuous belching during the case while lying flat.   Aortic saturation 88%, PA saturation 67%, PA pressure 25/11, mean PA pressure 18 mmHg, mean pulmonary capillary wedge pressure 17 mmHg, cardiac output 6.72 L/min, cardiac index 3.95.  Mean right atrial pressure 5 mmHg.   Essentially normal  right heart pressures after recent diuresis due to elevated BNP. Severe, three-vessel coronary artery disease.  Significant progression of her CAD compared to her 2009 catheterization. I do not think she would be a surgical candidate due to overall frailty and age.  No good PCI options.  Results conveyed by phone to the daughter. Of note, she was having significant reflux, belching during the procedure while lying flat.  She was treated with Zofran.  In talking to her daughter, this is her biggest complaint.  She cannot sleep at night.  She has to sit up to avoid these symptoms.  Her next issue is when she tries to exert, she has chest discomfort.  This is more cardiac pain.  Will discuss with interventional colleagues but I do not see any targets for PCI given the diffuse nature of her severe CAD.  Would consider palliative care. Will watch overnight in the hospital to help with medicine titration and possible palliative care consult.  Addendum Date: 04/26/2022     Mid LM to Dist LM lesion is 90% stenosed.   Ost Cx to Prox Cx lesion is 75% stenosed.   1st Mrg lesion is 70% stenosed.   Prox LAD to Mid LAD lesion is 90% stenosed.   Dist LAD lesion is 100% stenosed.   Mid RCA lesion is 100% stenosed.   LV end diastolic pressure is normal.  LVEDP 15 mmHg.   There is no aortic valve stenosis.   Severe, near continuous belching during the case while lying flat.   Aortic saturation 88%, PA saturation 67%, PA pressure 25/11, mean PA pressure 18 mmHg, mean pulmonary capillary wedge pressure 17 mmHg, cardiac output 6.72 L/min, cardiac index 3.95.  Mean right atrial pressure 5 mmHg.   Essentially normal right heart pressures after recent diuresis due to elevated BNP.  Severe, three-vessel coronary artery disease.  Significant progression of her CAD compared to her 2009 catheterization. I do not think she would be a surgical candidate due to overall frailty and age.  No good PCI options.  Results conveyed by phone to the  daughter. Of note, she was having significant reflux, belching during the procedure while lying flat.  She was treated with Zofran.  In talking to her daughter, this is her biggest complaint.  She cannot sleep at night.  She has to sit up to avoid these symptoms.  Her next issue is when she tries to exert, she has chest discomfort.  This is more cardiac pain.  Will discuss with interventional colleagues but I do not see any targets for PCI given the diffuse nature of her severe CAD.  Would consider palliative care. Will watch overnight in the hospital to help with medicine titration and possible palliative care consult.  Addendum Date: 04/26/2022     Mid LM to Dist LM lesion is 90% stenosed.   Ost Cx to Prox Cx lesion is 75% stenosed.   1st Mrg lesion is 70% stenosed.   Prox LAD to Mid LAD lesion is 90% stenosed.   Dist LAD lesion is 100% stenosed.   Mid RCA lesion is 100% stenosed.   LV end diastolic pressure is normal.  LVEDP 15 mmHg.   There is no aortic valve stenosis.   Severe, near continuous belching during the case while lying flat.   Aortic saturation 88%, PA saturation 67%, PA pressure 25/11, mean PA pressure 18 mmHg, mean pulmonary capillary wedge pressure 17 mmHg, cardiac output 6.72 L/min, cardiac index 3.95.  Mean right atrial pressure 5 mmHg.   Essentially normal right heart pressures after recent diuresis due to elevated BNP. Severe, three-vessel coronary artery disease.  Significant progression of her CAD compared to her 2009 catheterization. I do not think she would be a surgical candidate due to overall frailty and age.  No good PCI options.  Results conveyed by phone to the daughter. Of note, she was having significant reflux, belching during the procedure while lying flat.  She was treated with Zofran.  In talking to her daughter, this is her biggest complaint.  She cannot sleep at night.  She has to sit up to avoid these symptoms.  Her next issue is when she tries to exert, she has chest  discomfort.  This is more cardiac pain.  Will discuss with interventional colleagues but I do not see any targets for PCI given the diffuse nature of her severe CAD.  Would consider palliative care. Will watch overnight in the hospital to help with medicine titration and possible palliative care consult.  Result Date: 04/26/2022   Mid LM to Dist LM lesion is 90% stenosed.   Ost Cx to Prox Cx lesion is 75% stenosed.   1st Mrg lesion is 70% stenosed.   Prox LAD to Mid LAD lesion is 90% stenosed.   Dist LAD lesion is 100% stenosed.   Mid RCA lesion is 100% stenosed.   LV end diastolic pressure is normal.   There is no aortic valve stenosis.   Severe, near continuous belching during the case while lying flat. Severe, three-vessel coronary artery disease.  Significant progression of her CAD compared to her 2009 catheterization. I do not think she would be a surgical candidate due to overall frailty and age.  No good PCI options.  Results conveyed by phone to the daughter. Of note, she was having significant  reflux, belching during the procedure while lying flat.  She was treated with Zofran.  In talking to her daughter, this is her biggest complaint.  She cannot sleep at night.  She has to sit up to avoid these symptoms.  Her next issue is when she tries to exert, she has chest discomfort.  This is more cardiac pain.  Will discuss with interventional colleagues but I do not see any targets for PCI given the diffuse nature of her severe CAD.  Would consider palliative care. Will watch overnight in the hospital to help with medicine titration and possible palliative care consult.    Cardiac Studies   Severe CAD by cath  Patient Profile     86 y.o. female severe CAD  Assessment & Plan    CAD: Severe three vessel CAD.  No options for PCI.  Not a surgical candidate.  Continue medical therapy.  No cardiac sx at rest.    GI: Biggest complaint has been constant belching, most bothersome at night as she cannot  lie down to sleep.  She has to sit up.  Continue protonix.  Belching much improved this AM. COntinue current meds.  HTN: The current medical regimen is effective;  continue present plan and medications.   Hypokalemia: Continue ARB.  Improved on yesterdays reading. COntinue eating potassium rich foods.  Normal renal function.   OK for discharge later today.     For questions or updates, please contact [place] Please consult [email or web address] for contact info under        Signed, Larae Grooms, MD  04/28/2022, 8:44 AM

## 2022-04-28 NOTE — Progress Notes (Signed)
PT AVS reviewed and pt verbalized understanding of DC teaching and instructions. Pt has all belongings with her and daughter is transportation.

## 2022-05-02 ENCOUNTER — Other Ambulatory Visit: Payer: Self-pay

## 2022-05-02 DIAGNOSIS — I5032 Chronic diastolic (congestive) heart failure: Secondary | ICD-10-CM

## 2022-05-02 DIAGNOSIS — I119 Hypertensive heart disease without heart failure: Secondary | ICD-10-CM

## 2022-05-02 DIAGNOSIS — I25118 Atherosclerotic heart disease of native coronary artery with other forms of angina pectoris: Secondary | ICD-10-CM

## 2022-05-03 ENCOUNTER — Telehealth: Payer: Self-pay | Admitting: *Deleted

## 2022-05-03 NOTE — Progress Notes (Signed)
Pt has been made aware of normal result and verbalized understanding.  jw

## 2022-05-03 NOTE — Progress Notes (Signed)
  Care Coordination  Outreach Note  05/03/2022 Name: [name] [name] MRN: [id number] DOB: [birth year]   Care Coordination Outreach Attempts: An unsuccessful telephone outreach was attempted today to offer the patient information about available care coordination services as a benefit of their health plan.   Follow Up Plan:  Additional outreach attempts will be made to offer the patient care coordination information and services.   Encounter Outcome:  No Answer  Christie Nottingham  Care Coordination Care Guide  Direct Dial: [phone]

## 2022-05-04 NOTE — Progress Notes (Signed)
  Care Coordination   Note   05/04/2022 Name: [name] [name] MRN: [id number] DOB: May 04, 1936  [name] [name] is a 86 y.o. year old female who sees Jarome Matin (Inactive) for primary care. I reached out to Genice Rouge by phone today to offer care coordination services.  [name] [name] was given information about Care Coordination services today including:   The Care Coordination services include support from the care team which includes your Nurse Coordinator, Clinical Social Worker, or Pharmacist.  The Care Coordination team is here to help remove barriers to the health concerns and goals most important to you. Care Coordination services are voluntary, and the patient may decline or stop services at any time by request to their care team member.   Care Coordination Consent Status: Patient agreed to services and verbal consent obtained.   Follow up plan:  Telephone appointment with care coordination team member scheduled for:  05/05/22  Encounter Outcome:  Pt. Scheduled  [facility] Coordination Care Guide  Direct Dial: [phone]

## 2022-05-05 ENCOUNTER — Ambulatory Visit: Payer: Self-pay

## 2022-05-05 NOTE — Patient Outreach (Signed)
  Care Coordination   Initial Visit Note   05/05/2022 Name: [name] [name] MRN: [id number] DOB: [birth year]  [name] [name] is a 86 y.o. year old female who sees Jarome Matin (Inactive) for primary care. I spoke with  [name] [name] by phone today.  What matters to the patients health and wellness today? Referral to care coordination per hospital liaison to assist patient in following up with [facility] to complete Advance directives initiated while in hospital. Recent admission 11/21-11/23 for CAD. Patient with h/o HF. Mrs. Ikner reports feeling better. She states she lives with husband, who has dementia. And her daughter and son in law have moved in to help take care of them. She confirms that she has [facility]'s contact number and they are aware that she was admitted to the hospital. She reports feeling weaker as in mobility and expresses concern about loss of strength. She also request information on diet. Per discharge instructions patient on heart healthy, low salt diet.   Goals Addressed             This Visit's Progress    Community resources post hospitalization       Care Coordination Interventions: Advised patient to discuss activity level and possible physical therapy or cardiac rehab and diet with cardiologist at next visit on 05/10/22 Provided education to patient re: heart health low salt diet Confirmed patient has [facility]'s contact number and know how to get in contact with them  [facility] reviewed discharged instructions with patient post hospitalization and confirmed she has follow up appointments scheduled. Reinforced post cath site care instructions per after visit summary Confirmed patient weighs daily, reinforced worsening signs/symptoms and when to call the doctor Provided contact number to writer and assigned care coordinator and encouraged to contact with questions as needed       SDOH assessments and interventions completed:  No  Care  Coordination Interventions:  Yes, provided   Follow up plan: [facility] Care coordinator, Dudley Major to contact patient post office visit with cardiologist to see if patient has any additional care coordination needs. Follow up call scheduled for 05/16/22    Encounter Outcome:  Pt. Visit Completed   Kathyrn Sheriff, RN, MSN, BSN, CCM [facility] Care Coordinator [phone]

## 2022-05-09 DIAGNOSIS — I25118 Atherosclerotic heart disease of native coronary artery with other forms of angina pectoris: Secondary | ICD-10-CM | POA: Diagnosis not present

## 2022-05-10 ENCOUNTER — Encounter: Payer: Self-pay | Admitting: Physician Assistant

## 2022-05-10 ENCOUNTER — Ambulatory Visit: Payer: PPO | Attending: Physician Assistant | Admitting: Physician Assistant

## 2022-05-10 VITALS — BP 109/60 | HR 60 | Ht 60.0 in | Wt 160.4 lb

## 2022-05-10 DIAGNOSIS — I5032 Chronic diastolic (congestive) heart failure: Secondary | ICD-10-CM

## 2022-05-10 DIAGNOSIS — E7800 Pure hypercholesterolemia, unspecified: Secondary | ICD-10-CM

## 2022-05-10 DIAGNOSIS — F411 Generalized anxiety disorder: Secondary | ICD-10-CM

## 2022-05-10 DIAGNOSIS — I25119 Atherosclerotic heart disease of native coronary artery with unspecified angina pectoris: Secondary | ICD-10-CM

## 2022-05-10 DIAGNOSIS — I509 Heart failure, unspecified: Secondary | ICD-10-CM

## 2022-05-10 DIAGNOSIS — I10 Essential (primary) hypertension: Secondary | ICD-10-CM | POA: Diagnosis not present

## 2022-05-10 DIAGNOSIS — I350 Nonrheumatic aortic (valve) stenosis: Secondary | ICD-10-CM

## 2022-05-10 MED ORDER — METOPROLOL TARTRATE 25 MG PO TABS: 12.5000 mg | ORAL_TABLET | Freq: Two times a day (BID) | ORAL | 1 refills | Status: DC

## 2022-05-10 NOTE — Progress Notes (Signed)
Cardiology Office Note:    Date:  05/10/2022   ID:  [name] [name], DOB 1935-08-05, MRN [id number]  PCP:  Jarome Matin (Inactive)  [facility] Providers Cardiologist:  Lance Muss, MD     Referring MD: No ref. provider found   Chief Complaint:  Hospitalization Follow-up (S/p cardiac catheterization )    Patient Profile: DNR Coronary artery disease  Cath 2009: pRCA w CTO; dLM 20, mRI 60-70 (small), mOM1 50, mLAD 60, 70, dLAD sub-total occlusion; o/w mod non-obs dz No targets for PCI or CABG >> Med Rx Myoview in 07/16/11: inf ischemia / Myoview in 02/09/15: inf ischemia, EF 50 LHC 04/26/2022: severe 3v CAD - Mid-distal LM 90; LAD proximal 90, distal 100; LCx ostial/proximal 75; OM1 70; RCA mid 100; PCWP 17>>no targets for PCI, med Rx; palliative care Aortic stenosis Echo 08/05/2016: EF 60-65, mild AS (mean 10 mmHg)  Echo 03/17/2020: EF 60-65, mild aortic stenosis (mean 11) Limited echo 06/17/2020: EF 60-65, mild aortic stenosis (mean 8.5) Echo 11/26/2021: EF 65-70, no RWMA, mildly reduced RVSF, mild to moderate MR, moderate aortic stenosis (mean 17.4, V-max 281.2 cm/s, DI 0.31) Mitral regurgitation  (HFpEF) heart failure with preserved ejection fraction  Hypertension  PUD Hx of HBV, HCV GERD PVCs, PACs Chronic cough, ACEi stopped, ?related to GERD Possible asthma or upper airways cough syndrome  Hyperlipidemia  Intol of statins - Rosuvastatin (CK elevated); Pravastatin (rash); Atorvastatin (myalgias) Hx of pancreatitis Obesity  Rheumatoid arthritis  Normocytic anemia      History of Present Illness:   [name] [name] is a 86 y.o. female with the above problem list.  She was last seen 04/22/2022.  She had a lot of different types of chest pain.  Her diuretic was adjusted for volume excess.  She clearly had exertional angina and cardiac catheterization was arranged.  This was performed 04/26/2022 and demonstrated severe progression of three-vessel CAD.   There were no targets for PCI.  Medical therapy was recommended.  Patient was seen by palliative care in the hospital and she opted to be DNR.  She returns for follow-up. She has not had further chest pain or belching, shortness of breath, syncope. She has had some vertiginous symptoms. She has had significant fatigue. Her daughter thinks it may be due to the recent addition of Lexapro.   EKG:  sinus brady, HR 53, 1st degree AVB, PR 278 ms, inf Qs, ant-lat Qs, QTc 360 ms, low voltage.    Reviewed and updated this encounter:   Tobacco  Allergies  Meds  Problems  Med Hx  Surg Hx  Fam Hx      ROS   Labs/Other Test Reviewed:   Recent Labs: 09/29/2021: ALT 8 04/20/2022: NT-Pro BNP 1,771 04/22/2022: Platelets 200 04/26/2022: Hemoglobin 10.5 04/27/2022: BUN 13; Creatinine, Ser 1.16; Potassium 3.6; Sodium 141   Recent Lipid Panel Recent Labs    11/26/21 0830  CHOL 146  TRIG 34  HDL 44  VLDL 7  LDLCALC 95   Risk Assessment/Calculations/Metrics:              Physical Exam:    VS:  BP 109/60   Pulse 60   Ht 5' (1.524 m)   Wt 160 lb 6.4 oz (72.8 kg)   SpO2 96%   BMI 31.33 kg/m     Wt Readings from Last 3 Encounters:  05/10/22 160 lb 6.4 oz (72.8 kg)  04/28/22 159 lb 8 oz (72.3 kg)  04/22/22 160 lb 6.4 oz (72.8 kg)  Constitutional:      Appearance: Healthy appearance. Not in distress.  Neck:     Vascular: JVD normal.  Pulmonary:     Effort: Pulmonary effort is normal.     Breath sounds: No wheezing. No rales.  Cardiovascular:     Bradycardia present. Regular rhythm. Normal S1. Normal S2.      Murmurs: There is no murmur.  Edema:    Peripheral edema absent.  Abdominal:     Palpations: Abdomen is soft.  Skin:    General: Skin is warm and dry.  Neurological:     Mental Status: Alert and oriented to person, place and time.         ASSESSMENT & PLAN:   Coronary artery disease involving native coronary artery with angina pectoris (HCC) Recent cath with  severe 3 v CAD with 90% dLM stenosis. There are no targets for revasc. Med Rx has been recommended. She has been seen by Palliative Care and is now DNR. She is doing better w/o recurrent chest pain. She does have jaw pain. She is fatigued and her HR is in the low 50s. She has a 1st degree AVB as well.  Reduce metoprolol tartrate to 12.5 mg twice daily Continue Plavix 75 mg once daily, Imdur 60 mg once daily If chest pain recurs on lower dose beta-blocker, will increase Imdur to 90 mg once daily  F/u in 3 mos  Anxiety state She notes fatigue since starting this. Decrease escitalopram to 5 mg once daily and f/u with prescribing MD.   Moderate aortic stenosis Mod AS on echocardiogram in June 2023. No significant aortic stenosis noted on recent cardiac catheterization.   Hyperlipidemia Intol of statins.   Chronic heart failure with preserved ejection fraction (HCC) Volume appears stable. Continue Lasix 40 mg once daily, K+ 20 mEq once daily. BMET today.    Hypertension The patient's blood pressure is controlled on her current regimen.  Continue current therapy.           Dispo:  Return in about 3 months (around 08/09/2022) for Routine Follow Up w Dr. Eldridge Dace .   Medication Adjustments/Labs and Tests Ordered: Current medicines are reviewed at length with the patient today.  Concerns regarding medicines are outlined above.  Tests Ordered: Orders Placed This Encounter  Procedures   Basic metabolic panel   EKG 12-Lead   Medication Changes: Meds ordered this encounter  Medications   metoprolol tartrate (LOPRESSOR) 25 MG tablet    Sig: Take 0.5 tablets (12.5 mg total) by mouth 2 (two) times daily.    Dispense:  180 tablet    Refill:  1   Signed, Tereso Newcomer, PA-C  05/10/2022 10:26 PM    [facility] [address] [place], [place], [place]  [address] Phone: [phone]; Fax: [phone]

## 2022-05-10 NOTE — Patient Instructions (Addendum)
Medication Instructions: DECREASE ESCITALOPRAM TO 5 MG AND CALL DR THE PRESCRIBING DR FOR OTHER RECOMMENDATIONS  DECREASE METOPROLOL TO 12.5 MG TWICE DAILY   *If you need a refill on your cardiac medications before your next appointment, please call your pharmacy*   Lab Work: TODAY BMET  If you have labs (blood work) drawn today and your tests are completely normal, you will receive your results only by: MyChart Message (if you have MyChart) OR A paper copy in the mail If you have any lab test that is abnormal or we need to change your treatment, we will call you to review the results.   Testing/Procedures: NONE   Follow-Up: At [facility], you and your health needs are our priority.  As part of our continuing mission to provide you with exceptional heart care, we have created designated Provider Care Teams.  These Care Teams include your primary Cardiologist (physician) and Advanced Practice Providers (APPs -  Physician Assistants and Nurse Practitioners) who all work together to provide you with the care you need, when you need it.  We recommend signing up for the patient portal called "MyChart".  Sign up information is provided on this After Visit Summary.  MyChart is used to connect with patients for Virtual Visits (Telemedicine).  Patients are able to view lab/test results, encounter notes, upcoming appointments, etc.  Non-urgent messages can be sent to your provider as well.   To learn more about what you can do with MyChart, go to [email or web address].    Your next appointment:   3 month(s)  The format for your next appointment:   In Person  Provider:   Lance Muss, MD     Other Instructions NONE  Important Information About Sugar

## 2022-05-10 NOTE — Assessment & Plan Note (Signed)
She notes fatigue since starting this. Decrease escitalopram to 5 mg once daily and f/u with prescribing MD.

## 2022-05-10 NOTE — Assessment & Plan Note (Signed)
Volume appears stable. Continue Lasix 40 mg once daily, K+ 20 mEq once daily. BMET today.

## 2022-05-10 NOTE — Assessment & Plan Note (Signed)
Intol of statins.

## 2022-05-10 NOTE — Assessment & Plan Note (Signed)
Mod AS on echocardiogram in June 2023. No significant aortic stenosis noted on recent cardiac catheterization.

## 2022-05-10 NOTE — Assessment & Plan Note (Signed)
Recent cath with severe 3 v CAD with 90% dLM stenosis. There are no targets for revasc. Med Rx has been recommended. She has been seen by Palliative Care and is now DNR. She is doing better w/o recurrent chest pain. She does have jaw pain. She is fatigued and her HR is in the low 50s. She has a 1st degree AVB as well.  Reduce metoprolol tartrate to 12.5 mg twice daily Continue Plavix 75 mg once daily, Imdur 60 mg once daily If chest pain recurs on lower dose beta-blocker, will increase Imdur to 90 mg once daily  F/u in 3 mos

## 2022-05-10 NOTE — Assessment & Plan Note (Signed)
The patient's blood pressure is controlled on her current regimen.  Continue current therapy.   

## 2022-05-11 ENCOUNTER — Telehealth: Payer: Self-pay

## 2022-05-11 DIAGNOSIS — I10 Essential (primary) hypertension: Secondary | ICD-10-CM

## 2022-05-11 LAB — BASIC METABOLIC PANEL
BUN/Creatinine Ratio: 18 (ref 12–28)
BUN: 31 mg/dL — ABNORMAL HIGH (ref 8–27)
CO2: 22 mmol/L (ref 20–29)
Calcium: 9 mg/dL (ref 8.7–10.3)
Chloride: 106 mmol/L (ref 96–106)
Creatinine, Ser: 1.69 mg/dL — ABNORMAL HIGH (ref 0.57–1.00)
Glucose: 98 mg/dL (ref 70–99)
Potassium: 4 mmol/L (ref 3.5–5.2)
Sodium: 143 mmol/L (ref 134–144)
eGFR: 30 mL/min/{1.73_m2} — ABNORMAL LOW (ref >59)

## 2022-05-11 MED ORDER — FUROSEMIDE 20 MG PO TABS: 20.0000 mg | ORAL_TABLET | Freq: Every day | ORAL | 3 refills | Status: DC

## 2022-05-11 NOTE — Telephone Encounter (Signed)
-----   Message from Beatrice Lecher, [place] sent at 05/11/2022  2:32 PM EST ----- Creatinine increased. K+ normal. PLAN:  -Hold Telmisartan and Furosemide x 2 days -Stop K+ -After 2 days, resume Telmisartan 80 mg once daily and Furosemide at 20 mg once daily  -Weigh daily -Take extra Furosemide 20 mg if wt increases >/= 3 lbs in 1 day along with K+ 20 mEq -BMET 1 week  Tereso Newcomer, PA-C    05/11/2022 2:27 PM

## 2022-05-11 NOTE — Telephone Encounter (Signed)
The patient has been notified of the result and verbalized understanding.  All questions (if any) were answered. Ethelda Chick, RN 05/11/2022 6:17 PM   Placed order for lasix and BMET. Patient will come in on 05/18/22.

## 2022-05-14 DIAGNOSIS — R0602 Shortness of breath: Secondary | ICD-10-CM | POA: Diagnosis not present

## 2022-05-14 DIAGNOSIS — F419 Anxiety disorder, unspecified: Secondary | ICD-10-CM | POA: Diagnosis not present

## 2022-05-16 ENCOUNTER — Ambulatory Visit: Payer: Self-pay

## 2022-05-16 NOTE — Patient Outreach (Signed)
  Care Coordination   Follow Up Visit Note   05/16/2022 Name: [name] [name] MRN: [id number] DOB: Oct 26, 1935  [name] [name] is a 86 y.o. year old female who sees Jarome Matin (Inactive) for primary care. I spoke with  Genice Rouge by phone today.  What matters to the patients health and wellness today?  States she is not sure if she should be taking potassium now.  Daughter is filling her medication box.  States she has not had any chest pains for over a week.  States she is weighting daily and her weight was 155.4 today.  Denies any swelling or increase in shortness of breath.  States she is sleeping well and her GERD has improved.    Goals Addressed             This Visit's Progress    Community resources post hospitalization       Care Coordination Interventions: Advised patient to pace activity and to progress activity slowly Provided education to patient re: reinforced to follow heart health low salt diet Screening for signs and symptoms of depression related to chronic disease state  Assessed social determinant of health barriers Patient has [facility]'s contact number and know how to get in contact with them. Reinforced to call them as needed  Provided education on importance of blood pressure control in management of CAD Counseled on importance of regular laboratory monitoring as prescribed Reviewed Importance of taking all medications as prescribed Reviewed Importance of attending all scheduled provider appointments Advised to report any changes in symptoms or exercise tolerance Reviewed changes in medications from cardiology appointment including changes to potassium orders and advised to call cardiology if she or her daughter have any questions about medications.  Reviewed to keep lab appointment on 05/18/22 Reinforced importance of daily weights reinforced worsening signs/symptoms and when to call the doctor Provided contact number and encouraged to  contact with questions as needed         SDOH assessments and interventions completed:  Yes  SDOH Interventions Today    Flowsheet Row Most Recent Value  SDOH Interventions   Physical Activity Interventions Other (Comments)  [not able due to CAD progressing activity slowly]        Care Coordination Interventions:  Yes, provided   Follow up plan: Follow up call scheduled for 05/23/22    Encounter Outcome:  Pt. Visit Completed  Dudley Major RN, [facility], CDE Care Management Coordinator [facility] Care Management [phone]

## 2022-05-16 NOTE — Patient Instructions (Signed)
Visit Information  Thank you for taking time to visit with me today. Please don't hesitate to contact me if I can be of assistance to you.   Following are the goals we discussed today:   Goals Addressed             This Visit's Progress    Community resources post hospitalization       Care Coordination Interventions: Advised patient to pace activity and to progress activity slowly Provided education to patient re: reinforced to follow heart health low salt diet Screening for signs and symptoms of depression related to chronic disease state  Assessed social determinant of health barriers Patient has [facility] contact number and know how to get in contact with them. Reinforced to call them as needed  Provided education on importance of blood pressure control in management of CAD Counseled on importance of regular laboratory monitoring as prescribed Reviewed Importance of taking all medications as prescribed Reviewed Importance of attending all scheduled provider appointments Advised to report any changes in symptoms or exercise tolerance Reviewed changes in medications from cardiology appointment including changes to potassium orders and advised to call cardiology if she or her daughter have any questions about medications.  Reviewed to keep lab appointment on 05/18/22 Reinforced importance of daily weights reinforced worsening signs/symptoms and when to call the doctor Provided contact number and encouraged to contact with questions as needed         Our next appointment is by telephone on 05/23/22 at 10 AM  Please call the care guide team at [phone] if you need to cancel or reschedule your appointment.   If you are experiencing a Mental Health or Behavioral Health Crisis or need someone to talk to, please call the Suicide and Crisis Lifeline: 988 call the [place] National Suicide Prevention Lifeline: 501-739-8488 or TTY: (623)847-3561 TTY (938)872-8181) to talk to a trained  counselor call 1-800-273-TALK (toll free, 24 hour hotline) go to [facility] [address], [place] [phone]) call 911   Patient verbalizes understanding of instructions and care plan provided today and agrees to view in MyChart. Active MyChart status and patient understanding of how to access instructions and care plan via MyChart confirmed with patient.     Telephone follow up appointment with care management team member scheduled for:05/23/22  Dudley Major RN, [facility], CDE Care Management Coordinator [facility] Care Management [phone]

## 2022-05-18 ENCOUNTER — Ambulatory Visit: Payer: PPO | Attending: Physician Assistant

## 2022-05-18 DIAGNOSIS — I10 Essential (primary) hypertension: Secondary | ICD-10-CM

## 2022-05-19 LAB — BASIC METABOLIC PANEL
BUN/Creatinine Ratio: 15 (ref 12–28)
BUN: 22 mg/dL (ref 8–27)
CO2: 22 mmol/L (ref 20–29)
Calcium: 9.4 mg/dL (ref 8.7–10.3)
Chloride: 106 mmol/L (ref 96–106)
Creatinine, Ser: 1.43 mg/dL — ABNORMAL HIGH (ref 0.57–1.00)
Glucose: 107 mg/dL — ABNORMAL HIGH (ref 70–99)
Potassium: 4.2 mmol/L (ref 3.5–5.2)
Sodium: 141 mmol/L (ref 134–144)
eGFR: 36 mL/min/{1.73_m2} — ABNORMAL LOW (ref >59)

## 2022-05-20 ENCOUNTER — Telehealth: Payer: Self-pay | Admitting: *Deleted

## 2022-05-20 DIAGNOSIS — Z79899 Other long term (current) drug therapy: Secondary | ICD-10-CM

## 2022-05-20 MED ORDER — TELMISARTAN 80 MG PO TABS: 40.0000 mg | ORAL_TABLET | Freq: Every day | ORAL | 3 refills | Status: DC

## 2022-05-20 NOTE — Telephone Encounter (Signed)
-----   Message from Beatrice Lecher, [place] sent at 05/19/2022 11:21 AM EST ----- Creatinine improved somewhat but still higher compared to previous. K+ normal.  PLAN:  -Decrease Telmisartan to 40 mg once daily  -BMET 1 week -Have patient monitor BP for the next week and send BP readings to me to review.  Tereso Newcomer, PA-C    05/19/2022 11:17 AM

## 2022-05-23 ENCOUNTER — Ambulatory Visit: Payer: Self-pay

## 2022-05-23 NOTE — Patient Instructions (Signed)
Visit Information  Thank you for taking time to visit with me today. Please don't hesitate to contact me if I can be of assistance to you.   Following are the goals we discussed today:   Goals Addressed             This Visit's Progress    Community resources post hospitalization       Care Coordination Interventions: Advised patient to pace activity and to progress activity slowly Provided education to patient re: reinforced to follow heart health low salt diet Patient has [facility] contact number and know how to get in contact with them. Reinforced to call them as needed  Provided education on importance of blood pressure control in management of CAD Counseled on importance of regular laboratory monitoring as prescribed Reviewed Importance of taking all medications as prescribed Reviewed Importance of attending all scheduled provider appointments Advised to report any changes in symptoms or exercise tolerance Reviewed most recent change in medications from labs ordered by cardiology  including changes to telmisartan and their request that she record her B/P this week and report those results in a week. Advised to call cardiology if she or her daughter have any questions about medications.  Reviewed to keep lab appointment on 05/27/22 Reinforced importance of daily weights reinforced worsening signs/symptoms and when to call the doctor Reviewed safety and fall precautions and advised to avoid getting on ladder or step stools to hang curtains          Our next appointment is by telephone on 06/07/22 at 10 AM  Please call the care guide team at [phone] if you need to cancel or reschedule your appointment.   If you are experiencing a Mental Health or Behavioral Health Crisis or need someone to talk to, please call the Suicide and Crisis Lifeline: 988 call the [place] National Suicide Prevention Lifeline: 2502964939 or TTY: 4844718493 TTY [phone]) to talk to a trained  counselor call 1-800-273-TALK (toll free, 24 hour hotline) go to [facility] [address] [place] [phone]) call 911   Patient verbalizes understanding of instructions and care plan provided today and agrees to view in MyChart. Active MyChart status and patient understanding of how to access instructions and care plan via MyChart confirmed with patient.     Telephone follow up appointment with care management team member scheduled for: 06/07/22  Dudley Major RN, [facility], CDE Care Management Coordinator [facility] Care Management [phone]

## 2022-05-23 NOTE — Patient Outreach (Signed)
  Care Coordination   Follow Up Visit Note   05/23/2022 Name: [name] [name] MRN: [id number] DOB: [birth year]  [name] [name] is a 86 y.o. year old female who sees Jarome Matin (Inactive) for primary care. I spoke with  Genice Rouge by phone today.  What matters to the patients health and wellness today?  I still tired and I get winded if I do very much.  States she got short of breath when she was hanging some curtains.  States her weight is stable at around 155 with no swelling.  States her B/P was 100/62 yesterday and she is writing them down to send to cardiology.  States that her daughter changed her telmisartan to 1/2 as ordered after her lab.    Goals Addressed             This Visit's Progress    Community resources post hospitalization       Care Coordination Interventions: Advised patient to pace activity and to progress activity slowly Provided education to patient re: reinforced to follow heart health low salt diet Patient has [facility] contact number and know how to get in contact with them. Reinforced to call them as needed  Provided education on importance of blood pressure control in management of CAD Counseled on importance of regular laboratory monitoring as prescribed Reviewed Importance of taking all medications as prescribed Reviewed Importance of attending all scheduled provider appointments Advised to report any changes in symptoms or exercise tolerance Reviewed most recent change in medications from labs ordered by cardiology  including changes to telmisartan and their request that she record her B/P this week and report those results in a week. Advised to call cardiology if she or her daughter have any questions about medications.  Reviewed to keep lab appointment on 05/27/22 Reinforced importance of daily weights reinforced worsening signs/symptoms and when to call the doctor Reviewed safety and fall precautions and advised to avoid  getting on ladder or step stools to hang curtains          SDOH assessments and interventions completed:  No     Care Coordination Interventions:  Yes, provided   Follow up plan: Follow up call scheduled for 06/07/22    Encounter Outcome:  Pt. Visit Completed  Dudley Major RN, [facility], CDE Care Management Coordinator [facility] Care Management [phone]

## 2022-05-25 ENCOUNTER — Telehealth: Payer: Self-pay | Admitting: Physician Assistant

## 2022-05-25 DIAGNOSIS — R0602 Shortness of breath: Secondary | ICD-10-CM | POA: Diagnosis not present

## 2022-05-25 DIAGNOSIS — F419 Anxiety disorder, unspecified: Secondary | ICD-10-CM | POA: Diagnosis not present

## 2022-05-25 NOTE — Telephone Encounter (Signed)
New Message:     Patient says she needs a breathing machine. She wants to know what does she need to do to get one please?

## 2022-05-25 NOTE — Telephone Encounter (Signed)
Pt called back regarding her breathing machine question.   Pt states that she awoke this morning, and while walking to the kitchen felt mildly short of breath, and tired.  After investigating the breathing machine, it was discovered to be a nebulizer used with Albuterol ordered originally by Dr. Sandrea Hughs of Pulmonology.  Pt states the nebulizer is broken.   Pt spoke fine on the telephone with no difficulties, and stated she used another or "old inhaler" to breath better.  I had Pt check her BP and HR at home and it was 111/69 and HR 76, at 1102 am on 05/25/22.    Since no difficulties breathing or abnormal vital signs were apparent during our telephone call, I advised the Pt to reach out to Dr. Thurston Hole office regarding her pulmonology request.  I also advised Pt to call us back at [facility] if her BP, HR, or shortness of breath worsens.  Pt understood, and stated she will reach out to Dr. Sherene Sires.

## 2022-05-26 ENCOUNTER — Ambulatory Visit: Payer: PPO | Attending: Physician Assistant

## 2022-05-26 DIAGNOSIS — Z79899 Other long term (current) drug therapy: Secondary | ICD-10-CM | POA: Diagnosis not present

## 2022-05-26 LAB — BASIC METABOLIC PANEL
BUN/Creatinine Ratio: 15 (ref 12–28)
BUN: 21 mg/dL (ref 8–27)
CO2: 22 mmol/L (ref 20–29)
Calcium: 9.2 mg/dL (ref 8.7–10.3)
Chloride: 106 mmol/L (ref 96–106)
Creatinine, Ser: 1.4 mg/dL — ABNORMAL HIGH (ref 0.57–1.00)
Glucose: 96 mg/dL (ref 70–99)
Potassium: 3.8 mmol/L (ref 3.5–5.2)
Sodium: 143 mmol/L (ref 134–144)
eGFR: 37 mL/min/{1.73_m2} — ABNORMAL LOW (ref >59)

## 2022-05-27 ENCOUNTER — Telehealth: Payer: Self-pay | Admitting: Interventional Cardiology

## 2022-05-27 ENCOUNTER — Other Ambulatory Visit: Payer: PPO

## 2022-05-27 ENCOUNTER — Telehealth: Payer: Self-pay | Admitting: Internal Medicine

## 2022-05-27 ENCOUNTER — Other Ambulatory Visit: Payer: Self-pay | Admitting: *Deleted

## 2022-05-27 DIAGNOSIS — Z79899 Other long term (current) drug therapy: Secondary | ICD-10-CM

## 2022-05-27 MED ORDER — METOPROLOL TARTRATE 25 MG PO TABS: 12.5000 mg | ORAL_TABLET | Freq: Two times a day (BID) | ORAL | 3 refills | Status: DC

## 2022-05-27 MED ORDER — CLOPIDOGREL BISULFATE 75 MG PO TABS: 75.0000 mg | ORAL_TABLET | Freq: Every day | ORAL | 3 refills | Status: AC

## 2022-05-27 MED ORDER — FUROSEMIDE 20 MG PO TABS: 20.0000 mg | ORAL_TABLET | Freq: Every day | ORAL | 3 refills | Status: DC

## 2022-05-27 MED ORDER — TELMISARTAN 80 MG PO TABS: 40.0000 mg | ORAL_TABLET | Freq: Every day | ORAL | 3 refills | Status: AC

## 2022-05-27 MED ORDER — BUDESONIDE-FORMOTEROL FUMARATE 160-4.5 MCG/ACT IN AERO: 2.0000 | INHALATION_SPRAY | Freq: Two times a day (BID) | RESPIRATORY_TRACT | 0 refills | Status: DC | PRN

## 2022-05-27 MED ORDER — ISOSORBIDE MONONITRATE ER 60 MG PO TB24: 60.0000 mg | ORAL_TABLET | Freq: Every day | ORAL | 3 refills | Status: AC

## 2022-05-27 NOTE — Telephone Encounter (Signed)
Called Melissa from [facility] and told her I will send in 1 refill of Symbicort but patient will need to call to make follow up appointment for additional medication refills. Nothing further needed

## 2022-05-27 NOTE — Telephone Encounter (Signed)
*  STAT* If patient is at the pharmacy, call can be transferred to refill team.   1. Which medications need to be refilled? (please list name of each medication and dose if known)   isosorbide mononitrate (IMDUR) 60 MG 24 hr tablet  metoprolol tartrate (LOPRESSOR) 25 MG tablet  clopidogrel (PLAVIX) 75 MG tablet  furosemide (LASIX) 20 MG tablet  telmisartan (MICARDIS) 80 MG tablet   2. Which pharmacy/location (including street and city if local pharmacy) is medication to be sent to?  [facility] - [place], [place] - [address]   3. Do they need a 30 day or 90 day supply? 90 day  Caller stated patient is switching her prescriptions to [facility] and still has some of these medications.

## 2022-05-27 NOTE — Telephone Encounter (Signed)
[facility] called on behalf of patient to get a refill for symbicort. Advised Melissa that pt has not been seen in over 3 years therefore its unlikely Dr. Sherene Sires will fill without appointment . Melissa stated she would contact pt to let them know to schedule an appt with office but still would like to check with provider on refill - [phone]

## 2022-05-27 NOTE — Telephone Encounter (Signed)
Rx's sent to pharmacy.  

## 2022-05-31 ENCOUNTER — Emergency Department (HOSPITAL_COMMUNITY): Payer: PPO

## 2022-05-31 ENCOUNTER — Inpatient Hospital Stay (HOSPITAL_COMMUNITY): Payer: PPO

## 2022-05-31 ENCOUNTER — Inpatient Hospital Stay (HOSPITAL_COMMUNITY)
Admission: EM | Admit: 2022-05-31 | Discharge: 2022-06-05 | DRG: 280 | Disposition: A | Discharge status: Home / Self Care | Payer: PPO | Attending: Internal Medicine | Admitting: Internal Medicine

## 2022-05-31 ENCOUNTER — Other Ambulatory Visit: Payer: Self-pay

## 2022-05-31 ENCOUNTER — Encounter (HOSPITAL_COMMUNITY): Payer: Self-pay | Admitting: Physician Assistant

## 2022-05-31 DIAGNOSIS — Z9049 Acquired absence of other specified parts of digestive tract: Secondary | ICD-10-CM

## 2022-05-31 DIAGNOSIS — I959 Hypotension, unspecified: Secondary | ICD-10-CM | POA: Diagnosis present

## 2022-05-31 DIAGNOSIS — D72829 Elevated white blood cell count, unspecified: Secondary | ICD-10-CM | POA: Diagnosis not present

## 2022-05-31 DIAGNOSIS — R0602 Shortness of breath: Secondary | ICD-10-CM | POA: Diagnosis not present

## 2022-05-31 DIAGNOSIS — I252 Old myocardial infarction: Secondary | ICD-10-CM | POA: Diagnosis not present

## 2022-05-31 DIAGNOSIS — I130 Hypertensive heart and chronic kidney disease with heart failure and stage 1 through stage 4 chronic kidney disease, or unspecified chronic kidney disease: Secondary | ICD-10-CM | POA: Diagnosis present

## 2022-05-31 DIAGNOSIS — I429 Cardiomyopathy, unspecified: Secondary | ICD-10-CM | POA: Diagnosis present

## 2022-05-31 DIAGNOSIS — F32A Depression, unspecified: Secondary | ICD-10-CM | POA: Diagnosis present

## 2022-05-31 DIAGNOSIS — B1910 Unspecified viral hepatitis B without hepatic coma: Secondary | ICD-10-CM | POA: Diagnosis not present

## 2022-05-31 DIAGNOSIS — R079 Chest pain, unspecified: Secondary | ICD-10-CM | POA: Diagnosis not present

## 2022-05-31 DIAGNOSIS — I214 Non-ST elevation (NSTEMI) myocardial infarction: Principal | ICD-10-CM | POA: Diagnosis present

## 2022-05-31 DIAGNOSIS — I493 Ventricular premature depolarization: Secondary | ICD-10-CM | POA: Diagnosis present

## 2022-05-31 DIAGNOSIS — R0600 Dyspnea, unspecified: Secondary | ICD-10-CM | POA: Diagnosis not present

## 2022-05-31 DIAGNOSIS — I501 Left ventricular failure: Secondary | ICD-10-CM

## 2022-05-31 DIAGNOSIS — I25118 Atherosclerotic heart disease of native coronary artery with other forms of angina pectoris: Secondary | ICD-10-CM | POA: Diagnosis not present

## 2022-05-31 DIAGNOSIS — J9601 Acute respiratory failure with hypoxia: Secondary | ICD-10-CM | POA: Diagnosis not present

## 2022-05-31 DIAGNOSIS — I350 Nonrheumatic aortic (valve) stenosis: Secondary | ICD-10-CM | POA: Diagnosis not present

## 2022-05-31 DIAGNOSIS — Z1152 Encounter for screening for COVID-19: Secondary | ICD-10-CM | POA: Diagnosis not present

## 2022-05-31 DIAGNOSIS — I5043 Acute on chronic combined systolic (congestive) and diastolic (congestive) heart failure: Secondary | ICD-10-CM | POA: Diagnosis not present

## 2022-05-31 DIAGNOSIS — E872 Acidosis, unspecified: Secondary | ICD-10-CM | POA: Diagnosis not present

## 2022-05-31 DIAGNOSIS — T502X5A Adverse effect of carbonic-anhydrase inhibitors, benzothiadiazides and other diuretics, initial encounter: Secondary | ICD-10-CM | POA: Diagnosis not present

## 2022-05-31 DIAGNOSIS — I5032 Chronic diastolic (congestive) heart failure: Secondary | ICD-10-CM | POA: Diagnosis not present

## 2022-05-31 DIAGNOSIS — I2510 Atherosclerotic heart disease of native coronary artery without angina pectoris: Secondary | ICD-10-CM | POA: Diagnosis present

## 2022-05-31 DIAGNOSIS — Z801 Family history of malignant neoplasm of trachea, bronchus and lung: Secondary | ICD-10-CM

## 2022-05-31 DIAGNOSIS — E876 Hypokalemia: Secondary | ICD-10-CM | POA: Diagnosis not present

## 2022-05-31 DIAGNOSIS — Z8711 Personal history of peptic ulcer disease: Secondary | ICD-10-CM

## 2022-05-31 DIAGNOSIS — D638 Anemia in other chronic diseases classified elsewhere: Secondary | ICD-10-CM | POA: Diagnosis present

## 2022-05-31 DIAGNOSIS — Z888 Allergy status to other drugs, medicaments and biological substances status: Secondary | ICD-10-CM

## 2022-05-31 DIAGNOSIS — B1920 Unspecified viral hepatitis C without hepatic coma: Secondary | ICD-10-CM | POA: Diagnosis present

## 2022-05-31 DIAGNOSIS — G4730 Sleep apnea, unspecified: Secondary | ICD-10-CM | POA: Diagnosis present

## 2022-05-31 DIAGNOSIS — R7989 Other specified abnormal findings of blood chemistry: Secondary | ICD-10-CM | POA: Diagnosis not present

## 2022-05-31 DIAGNOSIS — J80 Acute respiratory distress syndrome: Secondary | ICD-10-CM | POA: Diagnosis not present

## 2022-05-31 DIAGNOSIS — J811 Chronic pulmonary edema: Secondary | ICD-10-CM | POA: Diagnosis not present

## 2022-05-31 DIAGNOSIS — N179 Acute kidney failure, unspecified: Secondary | ICD-10-CM | POA: Diagnosis not present

## 2022-05-31 DIAGNOSIS — E785 Hyperlipidemia, unspecified: Secondary | ICD-10-CM | POA: Diagnosis present

## 2022-05-31 DIAGNOSIS — Z7982 Long term (current) use of aspirin: Secondary | ICD-10-CM

## 2022-05-31 DIAGNOSIS — J45909 Unspecified asthma, uncomplicated: Secondary | ICD-10-CM | POA: Diagnosis present

## 2022-05-31 DIAGNOSIS — N1832 Chronic kidney disease, stage 3b: Secondary | ICD-10-CM | POA: Diagnosis present

## 2022-05-31 DIAGNOSIS — M069 Rheumatoid arthritis, unspecified: Secondary | ICD-10-CM | POA: Diagnosis not present

## 2022-05-31 DIAGNOSIS — I5021 Acute systolic (congestive) heart failure: Secondary | ICD-10-CM | POA: Diagnosis not present

## 2022-05-31 DIAGNOSIS — Z6830 Body mass index (BMI) 30.0-30.9, adult: Secondary | ICD-10-CM

## 2022-05-31 DIAGNOSIS — Z7902 Long term (current) use of antithrombotics/antiplatelets: Secondary | ICD-10-CM

## 2022-05-31 DIAGNOSIS — J449 Chronic obstructive pulmonary disease, unspecified: Secondary | ICD-10-CM | POA: Diagnosis not present

## 2022-05-31 DIAGNOSIS — R008 Other abnormalities of heart beat: Secondary | ICD-10-CM | POA: Diagnosis present

## 2022-05-31 DIAGNOSIS — Z800 Family history of malignant neoplasm of digestive organs: Secondary | ICD-10-CM

## 2022-05-31 DIAGNOSIS — Z515 Encounter for palliative care: Secondary | ICD-10-CM

## 2022-05-31 DIAGNOSIS — M810 Age-related osteoporosis without current pathological fracture: Secondary | ICD-10-CM | POA: Diagnosis present

## 2022-05-31 DIAGNOSIS — K449 Diaphragmatic hernia without obstruction or gangrene: Secondary | ICD-10-CM | POA: Diagnosis not present

## 2022-05-31 DIAGNOSIS — Z7951 Long term (current) use of inhaled steroids: Secondary | ICD-10-CM

## 2022-05-31 DIAGNOSIS — M170 Bilateral primary osteoarthritis of knee: Secondary | ICD-10-CM | POA: Diagnosis present

## 2022-05-31 DIAGNOSIS — I249 Acute ischemic heart disease, unspecified: Secondary | ICD-10-CM

## 2022-05-31 DIAGNOSIS — E440 Moderate protein-calorie malnutrition: Secondary | ICD-10-CM | POA: Diagnosis not present

## 2022-05-31 DIAGNOSIS — I509 Heart failure, unspecified: Principal | ICD-10-CM

## 2022-05-31 DIAGNOSIS — I25110 Atherosclerotic heart disease of native coronary artery with unstable angina pectoris: Secondary | ICD-10-CM | POA: Diagnosis not present

## 2022-05-31 DIAGNOSIS — Z825 Family history of asthma and other chronic lower respiratory diseases: Secondary | ICD-10-CM

## 2022-05-31 DIAGNOSIS — E669 Obesity, unspecified: Secondary | ICD-10-CM | POA: Diagnosis present

## 2022-05-31 DIAGNOSIS — Z881 Allergy status to other antibiotic agents status: Secondary | ICD-10-CM

## 2022-05-31 DIAGNOSIS — I10 Essential (primary) hypertension: Secondary | ICD-10-CM | POA: Diagnosis present

## 2022-05-31 DIAGNOSIS — Y92239 Unspecified place in hospital as the place of occurrence of the external cause: Secondary | ICD-10-CM | POA: Diagnosis not present

## 2022-05-31 DIAGNOSIS — Z886 Allergy status to analgesic agent status: Secondary | ICD-10-CM

## 2022-05-31 DIAGNOSIS — R0789 Other chest pain: Secondary | ICD-10-CM | POA: Diagnosis not present

## 2022-05-31 DIAGNOSIS — Z66 Do not resuscitate: Secondary | ICD-10-CM | POA: Diagnosis not present

## 2022-05-31 DIAGNOSIS — J90 Pleural effusion, not elsewhere classified: Secondary | ICD-10-CM | POA: Diagnosis not present

## 2022-05-31 DIAGNOSIS — I110 Hypertensive heart disease with heart failure: Secondary | ICD-10-CM | POA: Diagnosis not present

## 2022-05-31 DIAGNOSIS — K219 Gastro-esophageal reflux disease without esophagitis: Secondary | ICD-10-CM | POA: Diagnosis present

## 2022-05-31 DIAGNOSIS — Z823 Family history of stroke: Secondary | ICD-10-CM

## 2022-05-31 DIAGNOSIS — R918 Other nonspecific abnormal finding of lung field: Secondary | ICD-10-CM | POA: Diagnosis not present

## 2022-05-31 DIAGNOSIS — R0689 Other abnormalities of breathing: Secondary | ICD-10-CM | POA: Diagnosis not present

## 2022-05-31 DIAGNOSIS — Z79899 Other long term (current) drug therapy: Secondary | ICD-10-CM

## 2022-05-31 DIAGNOSIS — R062 Wheezing: Secondary | ICD-10-CM | POA: Diagnosis not present

## 2022-05-31 DIAGNOSIS — R54 Age-related physical debility: Secondary | ICD-10-CM | POA: Diagnosis present

## 2022-05-31 HISTORY — DX: Chronic kidney disease, stage 3b: N18.32

## 2022-05-31 LAB — COMPREHENSIVE METABOLIC PANEL
ALT: 14 U/L (ref 0–44)
AST: 16 U/L (ref 15–41)
Albumin: 3.3 g/dL — ABNORMAL LOW (ref 3.5–5.0)
Alkaline Phosphatase: 63 U/L (ref 38–126)
Anion gap: 10 (ref 5–15)
BUN: 25 mg/dL — ABNORMAL HIGH (ref 8–23)
CO2: 18 mmol/L — ABNORMAL LOW (ref 22–32)
Calcium: 8.1 mg/dL — ABNORMAL LOW (ref 8.9–10.3)
Chloride: 117 mmol/L — ABNORMAL HIGH (ref 98–111)
Creatinine, Ser: 1.33 mg/dL — ABNORMAL HIGH (ref 0.44–1.00)
GFR, Estimated: 39 mL/min — ABNORMAL LOW (ref >60)
Glucose, Bld: 141 mg/dL — ABNORMAL HIGH (ref 70–99)
Potassium: 3 mmol/L — ABNORMAL LOW (ref 3.5–5.1)
Sodium: 145 mmol/L (ref 135–145)
Total Bilirubin: 0.9 mg/dL (ref 0.3–1.2)
Total Protein: 6.3 g/dL — ABNORMAL LOW (ref 6.5–8.1)

## 2022-05-31 LAB — BRAIN NATRIURETIC PEPTIDE: B Natriuretic Peptide: 4012.1 pg/mL — ABNORMAL HIGH (ref 0.0–100.0)

## 2022-05-31 LAB — HEPARIN LEVEL (UNFRACTIONATED): Heparin Unfractionated: 0.42 IU/mL (ref 0.30–0.70)

## 2022-05-31 LAB — CBC
HCT: 33.7 % — ABNORMAL LOW (ref 36.0–46.0)
Hemoglobin: 10.7 g/dL — ABNORMAL LOW (ref 12.0–15.0)
MCH: 29.3 pg (ref 26.0–34.0)
MCHC: 31.8 g/dL (ref 30.0–36.0)
MCV: 92.3 fL (ref 80.0–100.0)
Platelets: 181 10*3/uL (ref 150–400)
RBC: 3.65 MIL/uL — ABNORMAL LOW (ref 3.87–5.11)
RDW: 14.8 % (ref 11.5–15.5)
WBC: 6.7 10*3/uL (ref 4.0–10.5)
nRBC: 0 % (ref 0.0–0.2)

## 2022-05-31 LAB — RESP PANEL BY RT-PCR (RSV, FLU A&B, COVID)  RVPGX2
Influenza A by PCR: NEGATIVE
Influenza B by PCR: NEGATIVE
Resp Syncytial Virus by PCR: NEGATIVE
SARS Coronavirus 2 by RT PCR: NEGATIVE

## 2022-05-31 LAB — PROTIME-INR
INR: 1.2 (ref 0.8–1.2)
Prothrombin Time: 14.8 seconds (ref 11.4–15.2)

## 2022-05-31 LAB — D-DIMER, QUANTITATIVE: D-Dimer, Quant: 1.82 ug/mL-FEU — ABNORMAL HIGH (ref 0.00–0.50)

## 2022-05-31 LAB — TROPONIN I (HIGH SENSITIVITY)
Troponin I (High Sensitivity): 190 ng/L (ref <18)
Troponin I (High Sensitivity): 226 ng/L (ref <18)

## 2022-05-31 LAB — MAGNESIUM: Magnesium: 2.6 mg/dL — ABNORMAL HIGH (ref 1.7–2.4)

## 2022-05-31 LAB — APTT: aPTT: 29 seconds (ref 24–36)

## 2022-05-31 MED ORDER — IRBESARTAN 150 MG PO TABS
150.0000 mg | ORAL_TABLET | Freq: Every day | ORAL | Status: DC
  Administered 2022-06-01 (×2): 150 mg via ORAL
  Filled 2022-05-31 (×4): qty 1

## 2022-05-31 MED ORDER — POTASSIUM CHLORIDE CRYS ER 20 MEQ PO TBCR: 20.0000 meq | EXTENDED_RELEASE_TABLET | ORAL | Status: DC | PRN

## 2022-05-31 MED ORDER — ONDANSETRON HCL 4 MG/2ML IJ SOLN
4.0000 mg | Freq: Four times a day (QID) | INTRAMUSCULAR | Status: DC | PRN
  Administered 2022-06-01 – 2022-06-03 (×2): 4 mg via INTRAVENOUS
  Filled 2022-05-31 (×2): qty 2

## 2022-05-31 MED ORDER — ACETAMINOPHEN 325 MG PO TABS
650.0000 mg | ORAL_TABLET | Freq: Four times a day (QID) | ORAL | Status: DC | PRN
  Administered 2022-06-01 – 2022-06-03 (×2): 650 mg via ORAL
  Filled 2022-05-31 (×2): qty 2

## 2022-05-31 MED ORDER — MOMETASONE FURO-FORMOTEROL FUM 200-5 MCG/ACT IN AERO
2.0000 | INHALATION_SPRAY | Freq: Two times a day (BID) | RESPIRATORY_TRACT | Status: DC
  Administered 2022-06-01 – 2022-06-05 (×9): 2 via RESPIRATORY_TRACT
  Filled 2022-05-31 (×2): qty 8.8

## 2022-05-31 MED ORDER — FUROSEMIDE 10 MG/ML IJ SOLN
20.0000 mg | Freq: Two times a day (BID) | INTRAMUSCULAR | Status: DC
  Administered 2022-05-31 – 2022-06-01 (×3): 20 mg via INTRAVENOUS
  Filled 2022-05-31: qty 2
  Filled 2022-05-31 (×2): qty 4

## 2022-05-31 MED ORDER — NITROGLYCERIN 2 % TD OINT
1.0000 [in_us] | TOPICAL_OINTMENT | Freq: Four times a day (QID) | TRANSDERMAL | Status: DC
  Administered 2022-05-31: 1 [in_us] via TOPICAL
  Filled 2022-05-31: qty 1

## 2022-05-31 MED ORDER — POTASSIUM CHLORIDE CRYS ER 20 MEQ PO TBCR
40.0000 meq | EXTENDED_RELEASE_TABLET | Freq: Once | ORAL | Status: AC
  Administered 2022-05-31: 40 meq via ORAL
  Filled 2022-05-31: qty 2

## 2022-05-31 MED ORDER — ROSUVASTATIN CALCIUM 5 MG PO TABS
5.0000 mg | ORAL_TABLET | Freq: Every day | ORAL | Status: DC
  Administered 2022-06-02 – 2022-06-05 (×4): 5 mg via ORAL
  Filled 2022-05-31 (×5): qty 1

## 2022-05-31 MED ORDER — MAGNESIUM SULFATE 2 GM/50ML IV SOLN
2.0000 g | Freq: Once | INTRAVENOUS | Status: AC
  Administered 2022-05-31: 2 g via INTRAVENOUS
  Filled 2022-05-31: qty 50

## 2022-05-31 MED ORDER — FAMOTIDINE 20 MG PO TABS
20.0000 mg | ORAL_TABLET | Freq: Every day | ORAL | Status: DC
  Administered 2022-06-01 – 2022-06-05 (×5): 20 mg via ORAL
  Filled 2022-05-31 (×6): qty 1

## 2022-05-31 MED ORDER — SODIUM CHLORIDE (PF) 0.9 % IJ SOLN
INTRAMUSCULAR | Status: AC
  Filled 2022-05-31: qty 50

## 2022-05-31 MED ORDER — TRAZODONE HCL 50 MG PO TABS
25.0000 mg | ORAL_TABLET | Freq: Every evening | ORAL | Status: DC | PRN
  Administered 2022-06-01: 25 mg via ORAL
  Filled 2022-05-31 (×2): qty 1

## 2022-05-31 MED ORDER — HEPARIN BOLUS VIA INFUSION
3500.0000 [IU] | Freq: Once | INTRAVENOUS | Status: DC
  Filled 2022-05-31: qty 3500

## 2022-05-31 MED ORDER — ONDANSETRON HCL 4 MG PO TABS: 4.0000 mg | ORAL_TABLET | Freq: Four times a day (QID) | ORAL | Status: DC | PRN

## 2022-05-31 MED ORDER — IOHEXOL 350 MG/ML SOLN
75.0000 mL | Freq: Once | INTRAVENOUS | Status: AC | PRN
  Administered 2022-05-31: 75 mL via INTRAVENOUS

## 2022-05-31 MED ORDER — ASPIRIN 81 MG PO CHEW
324.0000 mg | CHEWABLE_TABLET | Freq: Once | ORAL | Status: AC
  Administered 2022-05-31: 324 mg via ORAL
  Filled 2022-05-31: qty 4

## 2022-05-31 MED ORDER — CLOPIDOGREL BISULFATE 75 MG PO TABS
75.0000 mg | ORAL_TABLET | Freq: Every day | ORAL | Status: DC
  Administered 2022-06-01 – 2022-06-05 (×6): 75 mg via ORAL
  Filled 2022-05-31 (×7): qty 1

## 2022-05-31 MED ORDER — METOPROLOL TARTRATE 25 MG PO TABS
12.5000 mg | ORAL_TABLET | Freq: Two times a day (BID) | ORAL | Status: DC
  Administered 2022-06-01 – 2022-06-03 (×6): 12.5 mg via ORAL
  Filled 2022-05-31 (×7): qty 1

## 2022-05-31 MED ORDER — ALBUTEROL SULFATE (2.5 MG/3ML) 0.083% IN NEBU: 2.5000 mg | INHALATION_SOLUTION | Freq: Four times a day (QID) | RESPIRATORY_TRACT | Status: DC | PRN

## 2022-05-31 MED ORDER — HEPARIN BOLUS VIA INFUSION
3500.0000 [IU] | Freq: Once | INTRAVENOUS | Status: AC
  Administered 2022-05-31: 3500 [IU] via INTRAVENOUS
  Filled 2022-05-31: qty 3500

## 2022-05-31 MED ORDER — FUROSEMIDE 10 MG/ML IJ SOLN
20.0000 mg | Freq: Once | INTRAMUSCULAR | Status: AC
  Administered 2022-05-31: 20 mg via INTRAVENOUS
  Filled 2022-05-31: qty 4

## 2022-05-31 MED ORDER — HEPARIN (PORCINE) 25000 UT/250ML-% IV SOLN
750.0000 [IU]/h | INTRAVENOUS | Status: DC
  Administered 2022-05-31 – 2022-06-01 (×2): 750 [IU]/h via INTRAVENOUS
  Filled 2022-05-31: qty 250

## 2022-05-31 MED ORDER — HEPARIN (PORCINE) 25000 UT/250ML-% IV SOLN
750.0000 [IU]/h | INTRAVENOUS | Status: DC
  Filled 2022-05-31: qty 250

## 2022-05-31 MED ORDER — PANTOPRAZOLE SODIUM 40 MG PO TBEC
40.0000 mg | DELAYED_RELEASE_TABLET | Freq: Two times a day (BID) | ORAL | Status: DC
  Administered 2022-06-01 – 2022-06-05 (×10): 40 mg via ORAL
  Filled 2022-05-31 (×10): qty 1

## 2022-05-31 MED ORDER — ACETAMINOPHEN 650 MG RE SUPP: 650.0000 mg | Freq: Four times a day (QID) | RECTAL | Status: DC | PRN

## 2022-05-31 MED ORDER — NITROGLYCERIN IN D5W 200-5 MCG/ML-% IV SOLN
0.0000 ug/min | INTRAVENOUS | Status: DC
  Administered 2022-05-31: 10 ug/min via INTRAVENOUS
  Administered 2022-06-01: 5 ug/min via INTRAVENOUS
  Filled 2022-05-31 (×2): qty 250

## 2022-05-31 MED ORDER — POTASSIUM CHLORIDE CRYS ER 20 MEQ PO TBCR
20.0000 meq | EXTENDED_RELEASE_TABLET | Freq: Two times a day (BID) | ORAL | Status: DC
  Administered 2022-06-01 (×3): 20 meq via ORAL
  Filled 2022-05-31 (×3): qty 1

## 2022-05-31 MED ORDER — POTASSIUM CHLORIDE 10 MEQ/100ML IV SOLN
10.0000 meq | Freq: Once | INTRAVENOUS | Status: AC
  Administered 2022-05-31: 10 meq via INTRAVENOUS
  Filled 2022-05-31: qty 100

## 2022-05-31 NOTE — ED Notes (Signed)
Pure wick was placed on pt 

## 2022-05-31 NOTE — Progress Notes (Signed)
ANTICOAGULATION CONSULT NOTE - follow up  Pharmacy Consult for IV heparin  Indication:  ACS/possible NSTEMI  Allergies  Allergen Reactions   Gabapentin Other (See Comments)    Mental confusion   Aspirin Nausea Only   Clarithromycin Other (See Comments)    unknown   Doxycycline Nausea Only   Levaquin [Levofloxacin In D5w] Rash and Other (See Comments)    Headaches   Penicillins Itching and Rash    Other reaction(s): Unknown Has patient had a PCN reaction causing immediate rash, facial/tongue/throat swelling, SOB or lightheadedness with hypotension: No Has patient had a PCN reaction causing severe rash involving mucus membranes or skin necrosis: No Has patient had a PCN reaction that required hospitalization No Has patient had a PCN reaction occurring within the last 10 years: No If all of the above answers are "NO", then may proceed with Cephalosporin use.    Pravastatin Itching    REACTION: itching    Patient Measurements:   Heparin Dosing Weight: TBD 05/10/2022 72.8 kg  Vital Signs: Temp: 98 F (36.7 C) (12/26 1800) Temp Source: Axillary (12/26 1429) BP: 118/75 (12/26 2000) Pulse Rate: 81 (12/26 2000)  Labs: Recent Labs    05/31/22 1043 05/31/22 1230 05/31/22 2141  HGB 10.7*  --   --   HCT 33.7*  --   --   PLT 181  --   --   APTT 29  --   --   LABPROT 14.8  --   --   INR 1.2  --   --   HEPARINUNFRC  --   --  0.42  CREATININE 1.33*  --   --   TROPONINIHS 190* 226*  --      CrCl cannot be calculated (Unknown ideal weight.).   Medical History: Past Medical History:  Diagnosis Date   Aortic stenosis    Arthritis    CAD (coronary artery disease)    a. 02/2008 Cath: EF 60%, RCA 100 w/ L->R collats, LM 20d, RI 60-70, small, LAD 60/20m, 99d, D1 50, OM1 50. Poor distal targets for CABG/no good interventional target -> medical Rx; b. Myoview in [place] in 3/11 -nl;  c. 07/2011 MV: EF 67%, mild inf ischemia->Med Rx; 02/2015 MV: EF 50%, small, mod intensity apical and  apical inf rev defect-->Med Rx.   Chronic cough    hx; ACEI stopped, possible contribution from GERD   Chronic diastolic CHF (congestive heart failure) (HCC)    Echocardiogram 10/21: EF 60-65, no RWMA, mild LVH, Gr 2 DD, normal RVSF, severe LAE, small pericardial effusion, trivial MR, mild AS (mean 11 mmHg)   Chronic kidney disease, stage 3b (HCC)    Depression    Dyspnea    a. 08/2013 Echo: EF 60-65%, mild AS;  b. PFTs (10/10): FVC 103%, ratio 74%. mild obstruction; c. CT chest (6/11) no evidence for interstitial lung dz. possible asthma/upper airway cough sydrome. aspirin & beta blockers- potential causes for bronchospasm;   GERD (gastroesophageal reflux disease)    HBV (hepatitis B virus) infection    HCV (hepatitis C virus)    History of PAC's    hx   History of PVC's    hx   HLD (hyperlipidemia)    unable to tolerate most statins due to myalgias and elevated CK. thinks pravastatin casued a rash. Only tolerates crestor.   Hypertensive heart disease    IBS (irritable bowel syndrome)    Obesity    Osteoarthritis    knee; s/p TKR   Osteoporosis  Pancreatitis    hx   Pericardial effusion    small by echo in 03/2020 // Limited Echocardiogram 1/22: EF 60-65, no RWMA, mod LVH, Gr 1 DD, normal RVSF, mild LAE, mild MR, mild AS mean 8.5 mmHg), no pericardial effusion      PUD (peptic ulcer disease)    Sleep apnea    diagnosed in 2011 , did not want CPAP -     Medications:  No prior to admission anticoagulant medications listed  Assessment: Pharmacy consulted to dose IV heparin for ACS/STEMI 86 yo female with history of coronary artery disease, peptic ulcer disease, hepatitis B and C, irritable bowel syndrome, asthma, CHF, and aortic stenosis who presented to the ED with complaints of shortness of breath.  ED EKG: st depression anterior lateral leads.  Troponin elevated Hgb low at 10.7 and PLT WNL Baseline labs WNL  Heparin level 0.42 therapeutic on 750 units/hr No bleeding  noted  Goal of Therapy:  Heparin level 0.3-0.7 units/ml Monitor platelets by anticoagulation protocol: Yes   Plan:  Continue heparin drip at 750 units/hr Confirmatory 8 hr heparin level Monitor daily heparin level, CBC, signs/symptoms of bleeding   Thank you for allowing pharmacy to be a part of this patient's care.  Arley Phenix RPh 05/31/2022, 11:31 PM

## 2022-05-31 NOTE — ED Provider Notes (Signed)
[facility] DEPT Provider Note   CSN: [id number] Arrival date & time: 05/31/22  1006     History  Chief Complaint  Patient presents with   Shortness of [place] is a 86 y.o. female.   Shortness of Breath  Patient has a history of coronary artery disease, peptic ulcer disease, hepatitis B and C, irritable bowel syndrome, asthma, CHF, aortic stenosis who presents to the ED with complaints of shortness of breath.  Patient states she has been feeling short of breath for the past week.  Her symptoms have progressed and she became more acutely short of breath today.  EMS was called.  They noted that the patient does have history of asthma.  She ran out of her albuterol few days ago.  She did try albuterol without relief prior to calling EMS.  Patient was started on CPAP because of her respiratory difficulty.  She was continued on BiPAP here.  Patient does note some improvement with that treatment.  She has not had any leg swelling.  She has had some mild intermittent chest discomfort.   Medical records reviewed and the patient was admitted to the hospital on November 21.  Patient had a cardiac catheterization that showed severe three-vessel coronary artery disease.  Patient was not felt to be a surgical candidate because of her frailty and age.  She was not a good PCI candidate. Home Medications Prior to Admission medications   Medication Sig Start Date End Date Taking? Authorizing Provider  acetaminophen (TYLENOL) 500 MG tablet Take 1,000 mg by mouth every 6 (six) hours as needed for moderate pain or headache.    [provider]  albuterol (PROVENTIL) (2.5 MG/3ML) 0.083% nebulizer solution Take 3 mLs (2.5 mg total) by nebulization every 6 (six) hours as needed for wheezing or shortness of breath. 05/31/16   Tanda Rockers, MD  budesonide-formoterol (SYMBICORT) 160-4.5 MCG/ACT inhaler Inhale 2 puffs into the lungs 2 (two) times  daily as needed. 05/27/22   Tanda Rockers, MD  clopidogrel (PLAVIX) 75 MG tablet Take 1 tablet (75 mg total) by mouth daily. 05/27/22   Richardson Dopp T, PA-C  famotidine (PEPCID) 20 MG tablet Take 1 tablet (20 mg total) by mouth daily. 04/29/22   Strader, Fransisco Hertz, PA-C  furosemide (LASIX) 20 MG tablet Take 1 tablet (20 mg total) by mouth daily. Take extra tablet by mouth as needed for weight gain of 3 pounds in one day. 05/27/22   Richardson Dopp T, PA-C  isosorbide mononitrate (IMDUR) 60 MG 24 hr tablet Take 1 tablet (60 mg total) by mouth daily. 05/27/22   Richardson Dopp T, PA-C  metoCLOPramide (REGLAN) 10 MG tablet Take 10 mg by mouth daily.    [provider]  metoprolol tartrate (LOPRESSOR) 25 MG tablet Take 0.5 tablets (12.5 mg total) by mouth 2 (two) times daily. 05/27/22   Richardson Dopp T, PA-C  pantoprazole (PROTONIX) 40 MG tablet Take 1 tablet (40 mg total) by mouth 2 (two) times daily. 04/28/22   Strader, Fransisco Hertz, PA-C  potassium chloride SA (KLOR-CON M) 20 MEQ tablet Take 20 mEq by mouth as needed (when taking extra furosemide).    [provider]  telmisartan (MICARDIS) 80 MG tablet Take 0.5 tablets (40 mg total) by mouth daily. 05/27/22   Richardson Dopp T, PA-C  traZODone (DESYREL) 50 MG tablet Take 0.5 tablets (25 mg total) by mouth at bedtime. 04/28/22   Erma Heritage, PA-C  Vitamin D, Ergocalciferol, (DRISDOL) 1.25 MG (50000 UNIT) CAPS capsule Take 50,000 Units by mouth every Sunday. 05/25/20   [provider]      Allergies    Gabapentin, Aspirin, Clarithromycin, Doxycycline, Levaquin [levofloxacin in d5w], Penicillins, and Pravastatin    Review of Systems   Review of Systems  Respiratory:  Positive for shortness of breath.     Physical Exam Updated Vital Signs BP 111/71   Pulse 69   Temp 98.3 F (36.8 C) (Oral)   Resp (!) 28   SpO2 98%  Physical Exam Vitals and nursing note reviewed.  Constitutional:      Appearance: She is  well-developed. She is not diaphoretic.  HENT:     Head: Normocephalic and atraumatic.     Right Ear: External ear normal.     Left Ear: External ear normal.  Eyes:     General: No scleral icterus.       Right eye: No discharge.        Left eye: No discharge.     Conjunctiva/sclera: Conjunctivae normal.  Neck:     Vascular: JVD present.     Trachea: No tracheal deviation.  Cardiovascular:     Rate and Rhythm: Normal rate and regular rhythm.  Pulmonary:     Effort: Tachypnea present.     Breath sounds: Normal breath sounds. No stridor. No decreased breath sounds, wheezing, rhonchi or rales.  Abdominal:     General: Bowel sounds are normal. There is no distension.     Palpations: Abdomen is soft.     Tenderness: There is no abdominal tenderness. There is no guarding or rebound.  Musculoskeletal:        General: No tenderness or deformity.     Cervical back: Neck supple.     Right lower leg: No edema.     Left lower leg: No edema.  Skin:    General: Skin is warm and dry.     Findings: No rash.  Neurological:     General: No focal deficit present.     Mental Status: She is alert.     Cranial Nerves: No cranial nerve deficit, dysarthria or facial asymmetry.     Sensory: No sensory deficit.     Motor: No abnormal muscle tone or seizure activity.     Coordination: Coordination normal.  Psychiatric:        Mood and Affect: Mood normal.     ED Results / Procedures / Treatments   Labs (all labs ordered are listed, but only abnormal results are displayed) Labs Reviewed  COMPREHENSIVE METABOLIC PANEL - Abnormal; Notable for the following components:      Result Value   Potassium 3.0 (*)    Chloride 117 (*)    CO2 18 (*)    Glucose, Bld 141 (*)    BUN 25 (*)    Creatinine, Ser 1.33 (*)    Calcium 8.1 (*)    Total Protein 6.3 (*)    Albumin 3.3 (*)    GFR, Estimated 39 (*)    All other components within normal limits  CBC - Abnormal; Notable for the following components:    RBC 3.65 (*)    Hemoglobin 10.7 (*)    HCT 33.7 (*)    All other components within normal limits  D-DIMER, QUANTITATIVE - Abnormal; Notable for the following components:   D-Dimer, Quant 1.82 (*)    All other components within normal limits  BRAIN NATRIURETIC PEPTIDE - Abnormal; Notable for  the following components:   B Natriuretic Peptide 4,012.1 (*)    All other components within normal limits  TROPONIN I (HIGH SENSITIVITY) - Abnormal; Notable for the following components:   Troponin I (High Sensitivity) 190 (*)    All other components within normal limits  RESP PANEL BY RT-PCR (RSV, FLU A&B, COVID)  RVPGX2  APTT  PROTIME-INR  HEPARIN LEVEL (UNFRACTIONATED)  TROPONIN I (HIGH SENSITIVITY)    EKG EKG Interpretation  Date/Time:  Tuesday May 31 2022 10:48:13 EST Ventricular Rate:  88 PR Interval:  211 QRS Duration: 108 QT Interval:  326 QTC Calculation: 395 R Axis:   -28 Text Interpretation: Sinus rhythm Multiform ventricular premature complexes Inferior infarct, old Anterior infarct, old Lateral leads are also involved st depression anterior lateral leads Confirmed by Dorie Rank [phone]) on 05/31/2022 11:12:46 AM  Radiology DG Chest Port 1 View  Result Date: 05/31/2022 CLINICAL DATA:  Dyspnea. EXAM: PORTABLE CHEST 1 VIEW COMPARISON:  Radiographs 11/25/2021 and 05/29/2020.  CT 11/04/2009. FINDINGS: 1049 hours. The heart is enlarged and there is aortic atherosclerosis. The pulmonary vasculature is ill-defined, suspicious for mild edema. There may be small bilateral pleural effusions. No confluent airspace opacity or pneumothorax. No acute osseous findings are evident. Telemetry leads overlie the chest. IMPRESSION: Cardiomegaly with probable mild pulmonary edema and small bilateral pleural effusions. Findings suggest mild congestive heart failure. Electronically Signed   By: Richardean Sale M.D.   On: 05/31/2022 10:56    Procedures .Critical Care  Performed by: Dorie Rank,  MD Authorized by: Dorie Rank, MD   Critical care provider statement:    Critical care time (minutes):  45   Critical care was time spent personally by me on the following activities:  Development of treatment plan with patient or surrogate, discussions with consultants, evaluation of patient's response to treatment, examination of patient, ordering and review of laboratory studies, ordering and review of radiographic studies, ordering and performing treatments and interventions, pulse oximetry, re-evaluation of patient's condition and review of old charts     Medications Ordered in ED Medications  nitroGLYCERIN 50 mg in dextrose 5 % 250 mL (0.2 mg/mL) infusion (30 mcg/min Intravenous Rate/Dose Change 05/31/22 1228)  heparin ADULT infusion 100 units/mL (25000 units/218mL) (750 Units/hr Intravenous New Bag/Given 05/31/22 1223)  potassium chloride SA (KLOR-CON M) CR tablet 40 mEq (has no administration in time range)  potassium chloride 10 mEq in 100 mL IVPB (10 mEq Intravenous New Bag/Given 05/31/22 1255)  aspirin chewable tablet 324 mg (has no administration in time range)  furosemide (LASIX) injection 20 mg (20 mg Intravenous Given 05/31/22 1135)  heparin bolus via infusion 3,500 Units (3,500 Units Intravenous Bolus from Bag 05/31/22 1223)    ED Course/ Medical Decision Making/ A&P Clinical Course as of 05/31/22 1320  Tue May 31, 2022  1115 Chest x-ray suggests mild CHF [JK]  1139 CBC(!) Normal [JK]  1142 Patient is feeling more short of breath.  Oxygen saturation is normal.  She still on BiPAP.  She is also having chest discomfort.  Considering her known severe ACS I will start her on nitroglycerin and heparin [JK]  1243 Troponin I (High Sensitivity)(!!) Patient's troponin is elevated compared to previous [JK]  1243 BNP significantly elevated at 4000.  D-dimer is elevated at 1.82.  Renal function improving but still abnormal [JK]  1317 Case discussed with Dr Acie Fredrickson,  cardiology.   Discussed with Dr Olevia Bowens, hospitalist service.   Will proceed with CT angio [JK]    Clinical Course  User Index [JK] Dorie Rank, MD                           Medical Decision Making Problems Addressed: ACS (acute coronary syndrome) [facility]): acute illness or injury that poses a threat to life or bodily functions Acute congestive heart failure, unspecified heart failure type [facility]): acute illness or injury that poses a threat to life or bodily functions Hypokalemia: acute illness or injury that poses a threat to life or bodily functions  Amount and/or Complexity of Data Reviewed Labs: ordered. Decision-making details documented in ED Course. Radiology: ordered and independent interpretation performed.  Risk OTC drugs. Prescription drug management. Decision regarding hospitalization.   Patient presented to ED complaints of shortness of breath.  She is also experiencing chest discomfort.  Recently in the hospital and found to have severe coronary artery disease not amenable to any further interventions.  Patient had been doing well until this recent exacerbation.  Patient initially treated with albuterol by EMS.  No significant wheezing noted on my exam.  She did has distended neck veins.  Presentation more suggestive of CHF exacerbation with concerns for possible cardiac ischemia.  Patient has been started on IV heparin and nitroglycerin.  D-dimer is elevated but I suspect her symptoms are related to CHF and less likely PE.  She is on heparin right now also will hold off on CT angiogram considering her decreased creatinine clearance and likely acute CHF exacerbation.  I have consulted with the cardiology service.  I will consult the medical service for admission.        Final Clinical Impression(s) / ED Diagnoses Final diagnoses:  Acute congestive heart failure, unspecified heart failure type [facility])  ACS (acute coronary syndrome) ([place])  Hypokalemia    Rx / DC Orders ED Discharge Orders      None         Dorie Rank, MD 05/31/22 1320

## 2022-05-31 NOTE — Consult Note (Signed)
Cardiology Consultation   Patient ID: [name] [name] MRN: [id number]; DOB: [birth year]  Admit date: 05/31/2022 Date of Consult: 05/31/2022  PCP:  Leanna Battles (Inactive)   [facility] Providers Cardiologist:  Larae Grooms, MD   {   Patient Profile:   [name] [name] is a 86 y.o. female with a hx of extensive coronary artery disease without good interventional targets, moderate AS and mild-moderate MR by echo 11/2021 (but no AS by cath 04/2022), chronic HFpEF, HTN, PUD, HBV, HCV, GERD, PACs, PVCs, chronic cough, HLD (intolerant of statins), pancreatitis, obesity, RA, normocytic anemia, CKD stage 3b who is being seen 05/31/2022 for the evaluation of chest pain/elevated troponin at the request of Dr. Tomi Bamberger.  History of Present Illness:   [name] [name] has a complex coronary hx with diffuse disease back to 2009 without good targets for PCI or CABG, managed medically. Her stress testing has been abnormal since then. More recently she has been having worsening chest pain of varying degrees including exertional angina. Her Lasix was increased as outpatient for volume excess. She underwent catheterization by Dr. Irish Lack on 04/26/2022 which showed severe three-vessel CAD with significant progression of her CAD as compared to prior catheterization in 2009. Findings included 90% mid to distal left main stenosis, 75% ostial LCx stenosis, 70% first marginal stenosis, 90% proximal LAD stenosis, 100% distal LAD stenosis and 100% mid RCA stenosis. It was not felt she would be a surgical candidate due to her overall frailty and age and there were no good options for PCI. She was monitored overnight and Palliative Care was consulted the following morning and she did decide to pursue DNR status. It was recommended to continue OP palliative services. She was continued on Plavix and Amlodipine with Imdur being increased to 60mg  daily and starting Lopressor 25mg  BID. Protonix was also  increased to BID dosing and she was restarted on Pepcid as she also had belching issues, nearly continuous while lying flat. Her RHC showed essentially normal right heart pressures after recent diuresis due to elevated BNP. Of note, last echo 11/26/2021 showed EF 65-70, no RWMA, mildly reduced RVSF, mild to moderate MR, moderate aortic stenosis (mean 17.4, V-max 281.2 cm/s, DI 0.31).  She presented back to [facility] with worsening SOB. She ran out of albuterol for the last 2-3 days. She gave herself a dose an hour before arrival with no relief. On EMS arrival she was tripoding and required CPAP. She received 2 duonebs and 125mg  solu-medrol en route. She was transitioned to BiPAP here. Labs show Cr 1.33 (near baseline), K 3.0, CO2 18, Cl 117, albumin 3.3, BNP 4012, hsTroponin 190, Hgb 10.7 (similar to recent value), d-dimer 1.82, Covid/flu neg. CXR shows cardiomegaly with probable mild pulmonary edema and small bilateral pleural effusions, c/w mild CHF. ED has ordered CTA to r/o PE. EKG shows NSR 88bpm, occasional PVC, prior inferior and anterior infarct, ST depression noted I, II, avL, V5-V6 which appears accentuated from prior. She was given NTG paste transitioned to NTG drip, heparin drip, 20mg  IV Lasix + 64meq KCl, with plan for 324mg  ASA and KCl 68meq ordered.   Past Medical History:  Diagnosis Date   Aortic stenosis    Arthritis    CAD (coronary artery disease)    a. 02/2008 Cath: EF 60%, RCA 100 w/ L->R collats, LM 20d, RI 60-70, small, LAD 60/18m, 99d, D1 50, OM1 50. Poor distal targets for CABG/no good interventional target -> medical Rx; b. Myoview in [place] in 3/11 -nl;  c. 07/2011 MV: EF 67%, mild inf ischemia->Med Rx; 02/2015 MV: EF 50%, small, mod intensity apical and apical inf rev defect-->Med Rx.   Chronic cough    hx; ACEI stopped, possible contribution from GERD   Chronic diastolic CHF (congestive heart failure) (HCC)    Echocardiogram 10/21: EF 60-65, no RWMA, mild LVH, Gr 2 DD, normal RVSF,  severe LAE, small pericardial effusion, trivial MR, mild AS (mean 11 mmHg)   Chronic kidney disease, stage 3b (HCC)    Depression    Dyspnea    a. 08/2013 Echo: EF 60-65%, mild AS;  b. PFTs (10/10): FVC 103%, ratio 74%. mild obstruction; c. CT chest (6/11) no evidence for interstitial lung dz. possible asthma/upper airway cough sydrome. aspirin & beta blockers- potential causes for bronchospasm;   GERD (gastroesophageal reflux disease)    HBV (hepatitis B virus) infection    HCV (hepatitis C virus)    History of PAC's    hx   History of PVC's    hx   HLD (hyperlipidemia)    unable to tolerate most statins due to myalgias and elevated CK. thinks pravastatin casued a rash. Only tolerates crestor.   Hypertensive heart disease    IBS (irritable bowel syndrome)    Obesity    Osteoarthritis    knee; s/p TKR   Osteoporosis    Pancreatitis    hx   Pericardial effusion    small by echo in 03/2020 // Limited Echocardiogram 1/22: EF 60-65, no RWMA, mod LVH, Gr 1 DD, normal RVSF, mild LAE, mild MR, mild AS mean 8.5 mmHg), no pericardial effusion      PUD (peptic ulcer disease)    Sleep apnea    diagnosed in 2011 , did not want CPAP -     Past Surgical History:  Procedure Laterality Date   BREAST EXCISIONAL BIOPSY Right 1990   calcification of right breast-lumps Right 1998   CARDIAC CATHETERIZATION  2009   CHOLECYSTECTOMY     hyesterectomy, unspec. area  1998   pancreatic duct stent     RIGHT/LEFT HEART CATH AND CORONARY ANGIOGRAPHY N/A 04/26/2022   Procedure: RIGHT/LEFT HEART CATH AND CORONARY ANGIOGRAPHY;  Surgeon: Jettie Booze, MD;  Location: [facility];  Service: Cardiovascular;  Laterality: N/A;   TUBAL LIGATION  1995      Home Medications:  Prior to Admission medications   Medication Sig Start Date End Date Taking? Authorizing Provider  acetaminophen (TYLENOL) 500 MG tablet Take 1,000 mg by mouth every 6 (six) hours as needed for moderate pain or headache.     [provider]  albuterol (PROVENTIL) (2.5 MG/3ML) 0.083% nebulizer solution Take 3 mLs (2.5 mg total) by nebulization every 6 (six) hours as needed for wheezing or shortness of breath. 05/31/16   Tanda Rockers, MD  budesonide-formoterol (SYMBICORT) 160-4.5 MCG/ACT inhaler Inhale 2 puffs into the lungs 2 (two) times daily as needed. 05/27/22   Tanda Rockers, MD  clopidogrel (PLAVIX) 75 MG tablet Take 1 tablet (75 mg total) by mouth daily. 05/27/22   Richardson Dopp T, PA-C  famotidine (PEPCID) 20 MG tablet Take 1 tablet (20 mg total) by mouth daily. 04/29/22   Strader, Fransisco Hertz, PA-C  furosemide (LASIX) 20 MG tablet Take 1 tablet (20 mg total) by mouth daily. Take extra tablet by mouth as needed for weight gain of 3 pounds in one day. 05/27/22   Richardson Dopp T, PA-C  isosorbide mononitrate (IMDUR) 60 MG 24 hr tablet Take  1 tablet (60 mg total) by mouth daily. 05/27/22   Tereso Newcomer T, PA-C  metoCLOPramide (REGLAN) 10 MG tablet Take 10 mg by mouth daily.    [provider]  metoprolol tartrate (LOPRESSOR) 25 MG tablet Take 0.5 tablets (12.5 mg total) by mouth 2 (two) times daily. 05/27/22   Tereso Newcomer T, PA-C  pantoprazole (PROTONIX) 40 MG tablet Take 1 tablet (40 mg total) by mouth 2 (two) times daily. 04/28/22   Strader, Lennart Pall, PA-C  potassium chloride SA (KLOR-CON M) 20 MEQ tablet Take 20 mEq by mouth as needed (when taking extra furosemide).    [provider]  telmisartan (MICARDIS) 80 MG tablet Take 0.5 tablets (40 mg total) by mouth daily. 05/27/22   Tereso Newcomer T, PA-C  traZODone (DESYREL) 50 MG tablet Take 0.5 tablets (25 mg total) by mouth at bedtime. 04/28/22   Strader, Lennart Pall, PA-C  Vitamin D, Ergocalciferol, (DRISDOL) 1.25 MG (50000 UNIT) CAPS capsule Take 50,000 Units by mouth every Sunday. 05/25/20   [provider]    Inpatient Medications: Scheduled Meds:  aspirin  324 mg Oral Once   furosemide  20 mg Intravenous BID    potassium chloride  20 mEq Oral BID   potassium chloride  40 mEq Oral Once   Continuous Infusions:  heparin 750 Units/hr (05/31/22 1223)   magnesium sulfate bolus IVPB     nitroGLYCERIN 30 mcg/min (05/31/22 1228)   PRN Meds:   Allergies:    Allergies  Allergen Reactions   Gabapentin     Other reaction(s): Mental confusion   Aspirin Nausea Only   Clarithromycin Other (See Comments)    unknown   Doxycycline Nausea Only   Levaquin [Levofloxacin In D5w] Rash    Rash, HA   Penicillins Itching and Rash    Other reaction(s): Unknown Has patient had a PCN reaction causing immediate rash, facial/tongue/throat swelling, SOB or lightheadedness with hypotension: No Has patient had a PCN reaction causing severe rash involving mucus membranes or skin necrosis: No Has patient had a PCN reaction that required hospitalization No Has patient had a PCN reaction occurring within the last 10 years: No If all of the above answers are "NO", then may proceed with Cephalosporin use.    Pravastatin Itching    REACTION: itching    Social History:   Social History   Socioeconomic History   Marital status: Married    Spouse name: Lollie Sails   Number of children: 4   Years of education: 12   Highest education level: Not on file  Occupational History   Occupation: retired     Associate Professor: RETIRED    Comment: [employer]  Tobacco Use   Smoking status: Never   Smokeless tobacco: Never   Tobacco comments:    2nd hand exposure x40 years   Vaping Use   Vaping Use: Never used  Substance and Sexual Activity   Alcohol use: No   Drug use: No   Sexual activity: Not Currently  Other Topics Concern   Not on file  Social History Narrative   Married, lives with husband; has children.   Retired Government social research officer.       Cell # [phone]; okay to leave a message.       Social Determinants of Health   Financial Resource Strain: Not on file  Food Insecurity: No Food Insecurity (04/26/2022)    Hunger Vital Sign    Worried About Running Out of Food in the Last Year: Never true  Ran Out of Food in the Last Year: Never true  Transportation Needs: No Transportation Needs (04/26/2022)   PRAPARE - Hydrologist (Medical): No    Lack of Transportation (Non-Medical): No  Physical Activity: Inactive (05/16/2022)   Exercise Vital Sign    Days of Exercise per Week: 0 days    Minutes of Exercise per Session: 0 min  Stress: Not on file  Social Connections: Not on file  Intimate Partner Violence: Not At Risk (04/26/2022)   Humiliation, Afraid, Rape, and Kick questionnaire    Fear of Current or Ex-Partner: No    Emotionally Abused: No    Physically Abused: No    Sexually Abused: No    Family History:    Family History  Problem Relation Age of Onset   Asthma Mother    Stroke Mother        massive   Arthritis/Rheumatoid Mother    Liver disease Father    Asthma Sister    Stroke Sister    COPD Sister    Emphysema Sister    Colon cancer Maternal Aunt    Lung cancer Maternal Aunt    Pancreatic cancer Brother      ROS:  Please see the history of present illness.  All other ROS reviewed and negative.     Physical Exam/Data:   Vitals:   05/31/22 1200 05/31/22 1224 05/31/22 1230 05/31/22 1330  BP: (!) 132/94 (!) 134/99 111/71 102/64  Pulse: 90 84 69 67  Resp: (!) 34 (!) 35 (!) 28 (!) 25  Temp:      TempSrc:      SpO2: 99% 99% 98% 98%   No intake or output data in the 24 hours ending 05/31/22 1358    05/10/2022    2:17 PM 04/28/2022    5:26 AM 04/27/2022   12:14 AM  Last 3 Weights  Weight (lbs) 160 lb 6.4 oz 159 lb 8 oz 160 lb 8 oz  Weight (kg) 72.757 kg 72.349 kg 72.802 kg     There is no height or weight on file to calculate BMI.  General: elderly female,  on BIPAP  Head: Normocephalic, atraumatic, sclera non-icteric, no xanthomas, nares are without discharge. Neck: Negative for carotid bruits. JVP not elevated. Lungs: reduced  breath sounds and coarse rhonchi / rales in the left base.  Slightly reduced breath sounds in R base  Heart: RRR S1 S2 without murmurs, rubs, or gallops.  Abdomen: Soft, non-tender, non-distended with normoactive bowel sounds. No rebound/guarding. Extremities: No clubbing or cyanosis. No edema. Distal pedal pulses are 2+ and equal bilaterally. Neuro: Alert and oriented X 3. Moves all extremities spontaneously. Psych:  Responds to questions appropriately with a normal affect.   EKG:  The EKG was personally reviewed and demonstrates:  NSR 88bpm, occasional PVC, prior inferior and anterior infarct, ST depression noted I, II, avL, V5-V6 - accentuated from prior  Telemetry:  Telemetry was personally reviewed and demonstrates:   NSR  , controlled V response   Relevant CV Studies: Cath 04/26/22    Mid LM to Dist LM lesion is 90% stenosed.   Ost Cx to Prox Cx lesion is 75% stenosed.   1st Mrg lesion is 70% stenosed.   Prox LAD to Mid LAD lesion is 90% stenosed.   Dist LAD lesion is 100% stenosed.   Mid RCA lesion is 100% stenosed.   LV end diastolic pressure is normal.  LVEDP 15 mmHg.   There is no  aortic valve stenosis.   Severe, near continuous belching during the case while lying flat.   Aortic saturation 88%, PA saturation 67%, PA pressure 25/11, mean PA pressure 18 mmHg, mean pulmonary capillary wedge pressure 17 mmHg, cardiac output 6.72 L/min, cardiac index 3.95.  Mean right atrial pressure 5 mmHg.   Essentially normal right heart pressures after recent diuresis due to elevated BNP.   Severe, three-vessel coronary artery disease.  Significant progression of her CAD compared to her 2009 catheterization.   I do not think she would be a surgical candidate due to overall frailty and age.  No good PCI options.  Results conveyed by phone to the daughter.   Of note, she was having significant reflux, belching during the procedure while lying flat.  She was treated with Zofran.  In talking to  her daughter, this is her biggest complaint.  She cannot sleep at night.  She has to sit up to avoid these symptoms.   Her next issue is when she tries to exert, she has chest discomfort.  This is more cardiac pain.  Will discuss with interventional colleagues but I do not see any targets for PCI given the diffuse nature of her severe CAD.  Would consider palliative care.   Will watch overnight in the hospital to help with medicine titration and possible palliative care consult.  Echo 11/2021    1. Left ventricular ejection fraction, by estimation, is 65 to 70%. The  left ventricle has normal function. The left ventricle has no regional  wall motion abnormalities. Left ventricular diastolic parameters are  indeterminate.   2. Right ventricular systolic function is mildly reduced. The right  ventricular size is mildly enlarged.   3. The mitral valve is normal in structure. Mild to moderate mitral valve  regurgitation.   4. The aortic valve is calcified. Aortic valve regurgitation is not  visualized. Moderate aortic valve stenosis.   Laboratory Data:  High Sensitivity Troponin:   Recent Labs  Lab 05/31/22 1043  TROPONINIHS 190*     Chemistry Recent Labs  Lab 05/26/22 0926 05/31/22 1043  NA 143 145  K 3.8 3.0*  CL 106 117*  CO2 22 18*  GLUCOSE 96 141*  BUN 21 25*  CREATININE 1.40* 1.33*  CALCIUM 9.2 8.1*  GFRNONAA  --  39*  ANIONGAP  --  10    Recent Labs  Lab 05/31/22 1043  PROT 6.3*  ALBUMIN 3.3*  AST 16  ALT 14  ALKPHOS 63  BILITOT 0.9   Lipids No results for input(s): "CHOL", "TRIG", "HDL", "LABVLDL", "LDLCALC", "CHOLHDL" in the last 168 hours.  Hematology Recent Labs  Lab 05/31/22 1043  WBC 6.7  RBC 3.65*  HGB 10.7*  HCT 33.7*  MCV 92.3  MCH 29.3  MCHC 31.8  RDW 14.8  PLT 181   Thyroid No results for input(s): "TSH", "FREET4" in the last 168 hours.  BNP Recent Labs  Lab 05/31/22 1034  BNP 4,012.1*    DDimer  Recent Labs  Lab 05/31/22 1043   DDIMER 1.82*     Radiology/Studies:  DG Chest Port 1 View  Result Date: 05/31/2022 CLINICAL DATA:  Dyspnea. EXAM: PORTABLE CHEST 1 VIEW COMPARISON:  Radiographs 11/25/2021 and 05/29/2020.  CT 11/04/2009. FINDINGS: 1049 hours. The heart is enlarged and there is aortic atherosclerosis. The pulmonary vasculature is ill-defined, suspicious for mild edema. There may be small bilateral pleural effusions. No confluent airspace opacity or pneumothorax. No acute osseous findings are evident. Telemetry leads overlie the  chest. IMPRESSION: Cardiomegaly with probable mild pulmonary edema and small bilateral pleural effusions. Findings suggest mild congestive heart failure. Electronically Signed   By: Richardean Sale M.D.   On: 05/31/2022 10:56     Assessment and Plan:   1. Acute respiratory failure requiring BiPAP with increased WOB 2. Elevated troponin/possible NSTEMI with known severe CAD 3. Suspected acute on chronic HFpEF but may have reduced LVEF given severe CAD 4. Moderate AS, mild-moderate MR by echo 11/2021 without any AS by cath 04/2022 5. Hypokalemia 6. CKD stage 3b 7. Normocytic anemia  Patient seen/examined by MD, see attestation for additional commentary.  Risk Assessment/Risk Scores:   TIMI Risk Score for Unstable Angina or Non-ST Elevation MI:   The patient's TIMI risk score is 4, which indicates a 20% risk of all cause mortality, new or recurrent myocardial infarction or need for urgent revascularization in the next 14 days.   New York Heart Association (NYHA) Functional Class NYHA Class IV   1.  Acute respiratory distress: Patient presents with acute shortness of breath.  This is quite likely due to LV dysfunction from her severe inoperable coronary artery disease.  She had a heart catheterization last month which revealed left main and three-vessel coronary artery disease.  There were no good targets for CABG or PCI.    She is currently on BiPAP, IV heparin, IV  nitroglycerin.  She seems to be feeling a little bit better.  Will continue IV heparin for total 48 hours.  Use IV nitroglycerin and transition to isosorbide after 48 hours.  She does not need repeat heart catheterization as we already know that she has severe, inoperable coronary artery disease.  I have discussed these facts with the patient and her son.  She needs to be considered for palliative clinic care/hospice care.         For questions or updates, please contact [place] Please consult [email or web address] for contact info under    Signed, Mertie Moores, MD  05/31/2022 1:58 PM

## 2022-05-31 NOTE — ED Triage Notes (Signed)
EMS reports from home, Hx of COPD, SOB x 2 days, just ran out of Albuterol x 2-3 days. Gave herself albuterol an hour ago without effect, on EMS arrival Pt tripoding.  BP 152/98 HR 80 RR 30 Sp02 100 (on CPAP)  2 Duonebs 125mg  Solumedrol - enroute  20ga LAC

## 2022-05-31 NOTE — Progress Notes (Incomplete)
Pharmacy: [facility]  Patient is an 86 y.o F with hx COPD who presented to the ED on 05/31/22 with c/o SOB.  She was found to have elevated troponin and is currently on heparin drip for suspected NSTEMI. Cardiology team recom to treat with heparin drip for 48 hrs.  - heparin level collected at  - no bleeding documented  Goal of Therapy:  Heparin level 0.3-0.7 units/ml Monitor platelets by anticoagulation protocol: Yes  Plan: - heparin units'hr - monitor for s/sx bleeding

## 2022-05-31 NOTE — Progress Notes (Addendum)
ANTICOAGULATION CONSULT NOTE - Initial Consult  Pharmacy Consult for IV heparin  Indication:  ACS/possible NSTEMI  Allergies  Allergen Reactions   Gabapentin     Other reaction(s): Mental confusion   Aspirin Nausea Only   Clarithromycin Other (See Comments)    unknown   Doxycycline Nausea Only   Levaquin [Levofloxacin In D5w] Rash    Rash, HA   Penicillins Itching and Rash    Other reaction(s): Unknown Has patient had a PCN reaction causing immediate rash, facial/tongue/throat swelling, SOB or lightheadedness with hypotension: No Has patient had a PCN reaction causing severe rash involving mucus membranes or skin necrosis: No Has patient had a PCN reaction that required hospitalization No Has patient had a PCN reaction occurring within the last 10 years: No If all of the above answers are "NO", then may proceed with Cephalosporin use.    Pravastatin Itching    REACTION: itching    Patient Measurements:   Heparin Dosing Weight: TBD 05/10/2022 72.8 kg  Vital Signs: Temp: 98.3 F (36.8 C) (12/26 1011) Temp Source: Oral (12/26 1011) BP: 132/119 (12/26 1130) Pulse Rate: 92 (12/26 1130)  Labs: Recent Labs    05/31/22 1043  HGB 10.7*  HCT 33.7*  PLT 181    CrCl cannot be calculated (Unknown ideal weight.).   Medical History: Past Medical History:  Diagnosis Date   Aortic stenosis    mild by echo in 03/2020   Arthritis    CAD (coronary artery disease)    a. 02/2008 Cath: EF 60%, RCA 100 w/ L->R collats, LM 20d, RI 60-70, small, LAD 60/5m, 99d, D1 50, OM1 50. Poor distal targets for CABG/no good interventional target -> medical Rx; b. Myoview in [place] in 3/11 -nl;  c. 07/2011 MV: EF 67%, mild inf ischemia->Med Rx; 02/2015 MV: EF 50%, small, mod intensity apical and apical inf rev defect-->Med Rx.   Chronic cough    hx; ACEI stopped, possible contribution from GERD   Chronic diastolic CHF (congestive heart failure) (HCC)    Echocardiogram 10/21: EF 60-65, no RWMA, mild  LVH, Gr 2 DD, normal RVSF, severe LAE, small pericardial effusion, trivial MR, mild AS (mean 11 mmHg)   Depression    Dyspnea    a. 08/2013 Echo: EF 60-65%, mild AS;  b. PFTs (10/10): FVC 103%, ratio 74%. mild obstruction; c. CT chest (6/11) no evidence for interstitial lung dz. possible asthma/upper airway cough sydrome. aspirin & beta blockers- potential causes for bronchospasm;   GERD (gastroesophageal reflux disease)    HBV (hepatitis B virus) infection    HCV (hepatitis C virus)    History of PAC's    hx   History of PVC's    hx   HLD (hyperlipidemia)    unable to tolerate most statins due to myalgias and elevated CK. thinks pravastatin casued a rash. Only tolerates crestor.   Hypertensive heart disease    IBS (irritable bowel syndrome)    Obesity    Osteoarthritis    knee; s/p TKR   Osteoporosis    Pancreatitis    hx   Pericardial effusion    small by echo in 03/2020 // Limited Echocardiogram 1/22: EF 60-65, no RWMA, mod LVH, Gr 1 DD, normal RVSF, mild LAE, mild MR, mild AS mean 8.5 mmHg), no pericardial effusion      PUD (peptic ulcer disease)    Sleep apnea    diagnosed in 2011 , did not want CPAP -     Medications:  No prior  to admission anticoagulant medications listed  Assessment: Pharmacy consulted to dose IV heparin for ACS/STEMI 86 yo female with history of coronary artery disease, peptic ulcer disease, hepatitis B and C, irritable bowel syndrome, asthma, CHF, and aortic stenosis who presented to the ED with complaints of shortness of breath.  ED EKG: st depression anterior lateral leads.  Troponin elevated Hgb low at 10.7 and PLT WNL  Goal of Therapy:  Heparin level 0.3-0.7 units/ml Monitor platelets by anticoagulation protocol: Yes   Plan:  Baseline aPTT and PT/INR Heparin 3500 unit IV bolus per infusion then 750 units/hr 8 hr heparin level Monitor daily heparin level, CBC, signs/symptoms of bleeding   Thank you for allowing pharmacy to be a part of this  patient's care.  Royetta Asal, PharmD, BCPS Clinical Pharmacist [place] Please utilize Amion for appropriate phone number to reach the unit pharmacist ([place]) 05/31/2022 12:18 PM

## 2022-05-31 NOTE — Progress Notes (Signed)
RT called to bedside for patient being more SOB. MD at beside waiting on test results at this time. Pt remains on BIPAP.

## 2022-05-31 NOTE — Progress Notes (Signed)
Pt came in with EMS on CPAP. RT tried pt off CPAP and pt had lots of WOB. RT placed pt  on BIPAP 12/6 40%.

## 2022-05-31 NOTE — Progress Notes (Signed)
Pt transported to CT on BIPAP with no complications.

## 2022-05-31 NOTE — H&P (Signed)
History and Physical    Patient: Lucina Krynski MIW:[id number] DOB: 22-Mar-1936 DOA: 05/31/2022 DOS: the patient was seen and examined on 05/31/2022 PCP: Jarome Matin (Inactive)  Patient coming from: Home  Chief Complaint:  Chief Complaint  Patient presents with   Shortness of Breath   HPI: Yazaira Gambone is a 86 y.o. female with medical history significant of aortic stenosis, osteoarthritis, CAD, ACE inhibitor induced cough, stage 3b CKD, depression, GERD, hepatitis B, hepatitis C, PACs, PVCs, hyperlipidemia, hypertensive heart disease, IBS, obesity, osteoarthritis of the knees with a history of TKR, osteoporosis, pancreatitis, pleural effusion, peptic ulcer disease, sleep apnea not on CPAP who was recently admitted and discharged for 2 days by cardiology due to angina undergoing cardiac catheterization which showed severe CAD was not amenable to stent placement and the patient is not a candidate for CABG.  She returns today to the emergency department with complaints of progressively worse dyspnea for the past 2 days with partial response to albuterol, but she ran out of it.  EMS was called.  They gave her 2 DuoNebs and 125 mg of Solu-Medrol IVP.  She denied fever, chills, rhinorrhea, sore throat or hemoptysis.  She has had some wheezing.  No chest pain, palpitations, diaphoresis, PND, orthopnea or pitting edema of the lower extremities.  No abdominal pain, nausea, emesis, diarrhea, constipation, melena or hematochezia.  No flank pain, dysuria, frequency or hematuria.  No polyuria, polydipsia, polyphagia or blurred vision.   ED course: Initial vital signs were temperature 98.3 F, pulse 93, respirations 24, BP 138/108 mmHg and O2 sat 100% on aerosol mask at 40%.  The patient was subsequently placed on BiPAP.  She also received aspirin 324 mg, furosemide 20 mg IVP, oral/IV K supplementation and heparin bolus followed by heparin infusion.  I added magnesium sulfate 2 g IVPB.  Lab  work: CBC showed a white count 6.7, hemoglobin 10.7 g/dL platelets 250.  D-dimer was 1.82.  PT 14.8, INR 1.2 and PTT 29.  First troponin 119 ng/L and second 1 is pending.  BNP 4012.1 pg/mL.  CMP showed a sodium 145, potassium 3.0, chloride 117 and CO2 18 mmol/L with a normal anion gap.  Glucose 141, BUN 25, creatinine 1.33 and calcium is 8.1 mg/dL (corrected 0.37).  Total protein 6.3 and albumin 3.3 g/dL.  The rest of the LFTs were normal.  Imaging: Portable 1 view chest radiograph showing cardiomegaly with mild pulmonary edema and small bilateral pleural effusions.   Review of Systems: As mentioned in the history of present illness. All other systems reviewed and are negative.  Past Medical History:  Diagnosis Date   Aortic stenosis    Arthritis    CAD (coronary artery disease)    a. 02/2008 Cath: EF 60%, RCA 100 w/ L->R collats, LM 20d, RI 60-70, small, LAD 60/46m, 99d, D1 50, OM1 50. Poor distal targets for CABG/no good interventional target -> medical Rx; b. Myoview in [place] in 3/11 -nl;  c. 07/2011 MV: EF 67%, mild inf ischemia->Med Rx; 02/2015 MV: EF 50%, small, mod intensity apical and apical inf rev defect-->Med Rx.   Chronic cough    hx; ACEI stopped, possible contribution from GERD   Chronic diastolic CHF (congestive heart failure) (HCC)    Echocardiogram 10/21: EF 60-65, no RWMA, mild LVH, Gr 2 DD, normal RVSF, severe LAE, small pericardial effusion, trivial MR, mild AS (mean 11 mmHg)   Chronic kidney disease, stage 3b (HCC)    Depression    Dyspnea  a. 08/2013 Echo: EF 60-65%, mild AS;  b. PFTs (10/10): FVC 103%, ratio 74%. mild obstruction; c. CT chest (6/11) no evidence for interstitial lung dz. possible asthma/upper airway cough sydrome. aspirin & beta blockers- potential causes for bronchospasm;   GERD (gastroesophageal reflux disease)    HBV (hepatitis B virus) infection    HCV (hepatitis C virus)    History of PAC's    hx   History of PVC's    hx   HLD (hyperlipidemia)     unable to tolerate most statins due to myalgias and elevated CK. thinks pravastatin casued a rash. Only tolerates crestor.   Hypertensive heart disease    IBS (irritable bowel syndrome)    Obesity    Osteoarthritis    knee; s/p TKR   Osteoporosis    Pancreatitis    hx   Pericardial effusion    small by echo in 03/2020 // Limited Echocardiogram 1/22: EF 60-65, no RWMA, mod LVH, Gr 1 DD, normal RVSF, mild LAE, mild MR, mild AS mean 8.5 mmHg), no pericardial effusion      PUD (peptic ulcer disease)    Sleep apnea    diagnosed in 2011 , did not want CPAP -    Past Surgical History:  Procedure Laterality Date   BREAST EXCISIONAL BIOPSY Right 1990   calcification of right breast-lumps Right 1998   CARDIAC CATHETERIZATION  2009   CHOLECYSTECTOMY     hyesterectomy, unspec. area  1998   pancreatic duct stent     RIGHT/LEFT HEART CATH AND CORONARY ANGIOGRAPHY N/A 04/26/2022   Procedure: RIGHT/LEFT HEART CATH AND CORONARY ANGIOGRAPHY;  Surgeon: Jettie Booze, MD;  Location: [facility];  Service: Cardiovascular;  Laterality: N/A;   TUBAL LIGATION  1995    Social History:  reports that she has never smoked. She has never used smokeless tobacco. She reports that she does not drink alcohol and does not use drugs.  Allergies  Allergen Reactions   Gabapentin     Other reaction(s): Mental confusion   Aspirin Nausea Only   Clarithromycin Other (See Comments)    unknown   Doxycycline Nausea Only   Levaquin [Levofloxacin In D5w] Rash    Rash, HA   Penicillins Itching and Rash    Other reaction(s): Unknown Has patient had a PCN reaction causing immediate rash, facial/tongue/throat swelling, SOB or lightheadedness with hypotension: No Has patient had a PCN reaction causing severe rash involving mucus membranes or skin necrosis: No Has patient had a PCN reaction that required hospitalization No Has patient had a PCN reaction occurring within the last 10 years: No If all of the  above answers are "NO", then may proceed with Cephalosporin use.    Pravastatin Itching    REACTION: itching    Family History  Problem Relation Age of Onset   Asthma Mother    Stroke Mother        massive   Arthritis/Rheumatoid Mother    Liver disease Father    Asthma Sister    Stroke Sister    COPD Sister    Emphysema Sister    Colon cancer Maternal Aunt    Lung cancer Maternal Aunt    Pancreatic cancer Brother     Prior to Admission medications   Medication Sig Start Date End Date Taking? Authorizing Provider  acetaminophen (TYLENOL) 500 MG tablet Take 1,000 mg by mouth every 6 (six) hours as needed for moderate pain or headache.    [provider]  albuterol (PROVENTIL) (2.5 MG/3ML) 0.083% nebulizer solution Take 3 mLs (2.5 mg total) by nebulization every 6 (six) hours as needed for wheezing or shortness of breath. 05/31/16   Nyoka Cowden, MD  budesonide-formoterol (SYMBICORT) 160-4.5 MCG/ACT inhaler Inhale 2 puffs into the lungs 2 (two) times daily as needed. 05/27/22   Nyoka Cowden, MD  clopidogrel (PLAVIX) 75 MG tablet Take 1 tablet (75 mg total) by mouth daily. 05/27/22   Tereso Newcomer T, PA-C  famotidine (PEPCID) 20 MG tablet Take 1 tablet (20 mg total) by mouth daily. 04/29/22   Strader, Lennart Pall, PA-C  furosemide (LASIX) 20 MG tablet Take 1 tablet (20 mg total) by mouth daily. Take extra tablet by mouth as needed for weight gain of 3 pounds in one day. 05/27/22   Tereso Newcomer T, PA-C  isosorbide mononitrate (IMDUR) 60 MG 24 hr tablet Take 1 tablet (60 mg total) by mouth daily. 05/27/22   Tereso Newcomer T, PA-C  metoCLOPramide (REGLAN) 10 MG tablet Take 10 mg by mouth daily.    [provider]  metoprolol tartrate (LOPRESSOR) 25 MG tablet Take 0.5 tablets (12.5 mg total) by mouth 2 (two) times daily. 05/27/22   Tereso Newcomer T, PA-C  pantoprazole (PROTONIX) 40 MG tablet Take 1 tablet (40 mg total) by mouth 2 (two) times daily. 04/28/22   Strader,  Lennart Pall, PA-C  potassium chloride SA (KLOR-CON M) 20 MEQ tablet Take 20 mEq by mouth as needed (when taking extra furosemide).    [provider]  telmisartan (MICARDIS) 80 MG tablet Take 0.5 tablets (40 mg total) by mouth daily. 05/27/22   Tereso Newcomer T, PA-C  traZODone (DESYREL) 50 MG tablet Take 0.5 tablets (25 mg total) by mouth at bedtime. 04/28/22   Strader, Lennart Pall, PA-C  Vitamin D, Ergocalciferol, (DRISDOL) 1.25 MG (50000 UNIT) CAPS capsule Take 50,000 Units by mouth every Sunday. 05/25/20   [provider]    Physical Exam: Vitals:   05/31/22 1130 05/31/22 1200 05/31/22 1224 05/31/22 1230  BP: (!) 132/119 (!) 132/94 (!) 134/99 111/71  Pulse: 92 90 84 69  Resp: (!) 29 (!) 34 (!) 35 (!) 28  Temp:      TempSrc:      SpO2: 100% 99% 99% 98%   Physical Exam Vitals and nursing note reviewed.  Constitutional:      General: She is awake. She is not in acute distress.    Appearance: She is well-developed. She is obese.     Interventions: Face mask in place.     Comments: BiPAP mask on.  HENT:     Head: Normocephalic.     Nose: No rhinorrhea.     Mouth/Throat:     Mouth: Mucous membranes are moist.  Eyes:     General: No scleral icterus.    Pupils: Pupils are equal, round, and reactive to light.  Neck:     Vascular: No JVD.  Cardiovascular:     Rate and Rhythm: Normal rate and regular rhythm.     Heart sounds: S1 normal and S2 normal.  Pulmonary:     Effort: Tachypnea present. No accessory muscle usage.     Breath sounds: Examination of the right-lower field reveals decreased breath sounds and rales. Examination of the left-lower field reveals decreased breath sounds and rales. Decreased breath sounds, rhonchi and rales present. No wheezing.  Abdominal:     General: Bowel sounds are normal.     Palpations: Abdomen is soft.  Tenderness: There is no abdominal tenderness.  Musculoskeletal:     Cervical back: Neck supple.     Right lower leg: No  edema.     Left lower leg: No edema.  Skin:    General: Skin is warm and dry.  Neurological:     General: No focal deficit present.     Mental Status: She is alert and oriented to person, place, and time.  Psychiatric:        Mood and Affect: Mood normal.        Behavior: Behavior normal. Behavior is cooperative.    ata Reviewed:  Results are pending, will review when available. Relevant CV Studies: Cath 04/26/22    Mid LM to Dist LM lesion is 90% stenosed.   Ost Cx to Prox Cx lesion is 75% stenosed.   1st Mrg lesion is 70% stenosed.   Prox LAD to Mid LAD lesion is 90% stenosed.   Dist LAD lesion is 100% stenosed.   Mid RCA lesion is 100% stenosed.   LV end diastolic pressure is normal.  LVEDP 15 mmHg.   There is no aortic valve stenosis.   Severe, near continuous belching during the case while lying flat.   Aortic saturation 88%, PA saturation 67%, PA pressure 25/11, mean PA pressure 18 mmHg, mean pulmonary capillary wedge pressure 17 mmHg, cardiac output 6.72 L/min, cardiac index 3.95.  Mean right atrial pressure 5 mmHg.   Essentially normal right heart pressures after recent diuresis due to elevated BNP.  Echo 11/2021  1. Left ventricular ejection fraction, by estimation, is 65 to 70%. The  left ventricle has normal function. The left ventricle has no regional  wall motion abnormalities. Left ventricular diastolic parameters are  indeterminate.   2. Right ventricular systolic function is mildly reduced. The right  ventricular size is mildly enlarged.   3. The mitral valve is normal in structure. Mild to moderate mitral valve  regurgitation.   4. The aortic valve is calcified. Aortic valve regurgitation is not  visualized. Moderate aortic valve stenosis.    EKG: Vent. rate 88 BPM PR interval 211 ms QRS duration 108 ms QT/QTcB 326/395 ms P-R-T axes 75 -28 151 Sinus rhythm Multiform ventricular premature complexes Inferior infarct, old Anterior infarct, old Lateral  leads are also involved st depression anterior lateral leads  Assessment and Plan: Principal Problem:   Pulmonary edema cardiac cause (HCC)   Elevated troponin In the setting of acute on:   Chronic heart failure with preserved ejection fraction (HCC) Observation/telemetry. Supplemental oxygen as needed. Sodium and fluid restriction. Continue nitroglycerin infusion. Continue heparin infusion. Continue metoprolol 12.5 mg p.o. twice daily. Continue telmisartan or formulary equivalent. Continue furosemide 20 mg IVP twice daily. Monitor daily weights, intake and output. Monitor renal function electrolytes. Check echocardiogram. Cardiology evaluation appreciated. Will follow the recommendations.  Active Problems:   CAD (coronary artery disease) Continue ASA, clopidogrel, beta-blocker, nitrates and statin    Hyperlipidemia Continue rosuvastatin 5 mg p.o. daily.    Hypertension Continue telmisartan 40 mg p.o. daily.   Continue metoprolol 12.5 mg p.o. twice daily. Continue furosemide 20 mg IVP twice daily.    GERD Continue famotidine 20 mg p.o. daily. Pantoprazole 40 mg p.o. daily.    Sleep apnea May use BiPAP at bedtime.    Stage 3b chronic kidney disease (CKD) (HCC) Monitor renal function electrolytes.      Advance Care Planning:   Code Status: Full Code   Consults: Cardiology  Family Communication:   Severity  of Illness: The appropriate patient status for this patient is INPATIENT. Inpatient status is judged to be reasonable and necessary in order to provide the required intensity of service to ensure the patient's safety. The patient's presenting symptoms, physical exam findings, and initial radiographic and laboratory data in the context of their chronic comorbidities is felt to place them at high risk for further clinical deterioration. Furthermore, it is not anticipated that the patient will be medically stable for discharge from the hospital within 2 midnights of  admission.   * I certify that at the point of admission it is my clinical judgment that the patient will require inpatient hospital care spanning beyond 2 midnights from the point of admission due to high intensity of service, high risk for further deterioration and high frequency of surveillance required.*  Author: Reubin Milan, MD 05/31/2022 1:24 PM  For on call review [email or web address].   This document was prepared using Dragon voice recognition software and may contain some unintended transcription errors.

## 2022-06-01 ENCOUNTER — Other Ambulatory Visit: Payer: PPO

## 2022-06-01 ENCOUNTER — Encounter (HOSPITAL_COMMUNITY): Payer: Self-pay | Admitting: Internal Medicine

## 2022-06-01 ENCOUNTER — Inpatient Hospital Stay (HOSPITAL_COMMUNITY): Payer: PPO

## 2022-06-01 DIAGNOSIS — N1832 Chronic kidney disease, stage 3b: Secondary | ICD-10-CM

## 2022-06-01 DIAGNOSIS — I509 Heart failure, unspecified: Secondary | ICD-10-CM

## 2022-06-01 DIAGNOSIS — I25110 Atherosclerotic heart disease of native coronary artery with unstable angina pectoris: Secondary | ICD-10-CM | POA: Diagnosis not present

## 2022-06-01 DIAGNOSIS — I501 Left ventricular failure: Secondary | ICD-10-CM | POA: Diagnosis not present

## 2022-06-01 DIAGNOSIS — E876 Hypokalemia: Secondary | ICD-10-CM | POA: Diagnosis not present

## 2022-06-01 DIAGNOSIS — I5032 Chronic diastolic (congestive) heart failure: Secondary | ICD-10-CM | POA: Diagnosis not present

## 2022-06-01 DIAGNOSIS — I5021 Acute systolic (congestive) heart failure: Secondary | ICD-10-CM | POA: Diagnosis not present

## 2022-06-01 LAB — BASIC METABOLIC PANEL
Anion gap: 8 (ref 5–15)
BUN: 38 mg/dL — ABNORMAL HIGH (ref 8–23)
CO2: 22 mmol/L (ref 22–32)
Calcium: 8.9 mg/dL (ref 8.9–10.3)
Chloride: 112 mmol/L — ABNORMAL HIGH (ref 98–111)
Creatinine, Ser: 1.7 mg/dL — ABNORMAL HIGH (ref 0.44–1.00)
GFR, Estimated: 29 mL/min — ABNORMAL LOW (ref >60)
Glucose, Bld: 124 mg/dL — ABNORMAL HIGH (ref 70–99)
Potassium: 4.4 mmol/L (ref 3.5–5.1)
Sodium: 142 mmol/L (ref 135–145)

## 2022-06-01 LAB — HEPARIN LEVEL (UNFRACTIONATED): Heparin Unfractionated: 0.45 IU/mL (ref 0.30–0.70)

## 2022-06-01 LAB — TROPONIN I (HIGH SENSITIVITY)
Troponin I (High Sensitivity): 2696 ng/L (ref <18)
Troponin I (High Sensitivity): 1995 ng/L (ref <18)

## 2022-06-01 LAB — ECHOCARDIOGRAM COMPLETE
AR max vel: 0.91 cm2
AV Area VTI: 0.92 cm2
AV Area mean vel: 0.91 cm2
AV Mean grad: 9 mmHg
AV Peak grad: 15.1 mmHg
Ao pk vel: 1.94 m/s
Area-P 1/2: 4.63 cm2
Calc EF: 28 %
Height: 60 in
MV M vel: 4.29 m/s
MV Peak grad: 73.4 mmHg
MV VTI: 1.11 cm2
Radius: 0.7 cm
S' Lateral: 4.1 cm
Single Plane A2C EF: 24.3 %
Single Plane A4C EF: 29.4 %
Weight: 2448 oz

## 2022-06-01 LAB — CBC
HCT: 31.1 % — ABNORMAL LOW (ref 36.0–46.0)
Hemoglobin: 10 g/dL — ABNORMAL LOW (ref 12.0–15.0)
MCH: 29.9 pg (ref 26.0–34.0)
MCHC: 32.2 g/dL (ref 30.0–36.0)
MCV: 92.8 fL (ref 80.0–100.0)
Platelets: 176 10*3/uL (ref 150–400)
RBC: 3.35 MIL/uL — ABNORMAL LOW (ref 3.87–5.11)
RDW: 15 % (ref 11.5–15.5)
WBC: 11.4 10*3/uL — ABNORMAL HIGH (ref 4.0–10.5)
nRBC: 0 % (ref 0.0–0.2)

## 2022-06-01 LAB — MRSA NEXT GEN BY PCR, NASAL: MRSA by PCR Next Gen: NOT DETECTED

## 2022-06-01 MED ORDER — PERFLUTREN LIPID MICROSPHERE
1.0000 mL | INTRAVENOUS | Status: AC | PRN
  Administered 2022-06-01: 2 mL via INTRAVENOUS

## 2022-06-01 MED ORDER — CHLORHEXIDINE GLUCONATE CLOTH 2 % EX PADS
6.0000 | MEDICATED_PAD | Freq: Every day | CUTANEOUS | Status: DC
  Administered 2022-06-01 – 2022-06-05 (×4): 6 via TOPICAL

## 2022-06-01 NOTE — Progress Notes (Signed)
ANTICOAGULATION CONSULT NOTE - Follow Up Consult  Pharmacy Consult for Heparin Indication: chest pain/ACS  Allergies  Allergen Reactions   Gabapentin Other (See Comments)    Mental confusion   Aspirin Nausea Only   Clarithromycin Other (See Comments)    unknown   Doxycycline Nausea Only   Levaquin [Levofloxacin In D5w] Rash and Other (See Comments)    Headaches   Penicillins Itching and Rash    Other reaction(s): Unknown Has patient had a PCN reaction causing immediate rash, facial/tongue/throat swelling, SOB or lightheadedness with hypotension: No Has patient had a PCN reaction causing severe rash involving mucus membranes or skin necrosis: No Has patient had a PCN reaction that required hospitalization No Has patient had a PCN reaction occurring within the last 10 years: No If all of the above answers are "NO", then may proceed with Cephalosporin use.    Pravastatin Itching    REACTION: itching    Patient Measurements: Height: 5' (152.4 cm) Weight: 69.4 kg (153 lb) IBW/kg (Calculated) : 45.5 Heparin Dosing Weight: 60 kg  Vital Signs: Temp: 98.4 F (36.9 C) (12/27 0100) Temp Source: Oral (12/27 0100) BP: 99/62 (12/27 0645) Pulse Rate: 88 (12/27 0645)  Labs: Recent Labs    05/31/22 1043 05/31/22 1230 05/31/22 2141 06/01/22 0553  HGB 10.7*  --   --   --   HCT 33.7*  --   --   --   PLT 181  --   --   --   APTT 29  --   --   --   LABPROT 14.8  --   --   --   INR 1.2  --   --   --   HEPARINUNFRC  --   --  0.42  --   CREATININE 1.33*  --   --   --   TROPONINIHS 190* 226* 2,696* 1,995*    CrCl cannot be calculated (Unknown ideal weight.).   Medications:  Scheduled:   clopidogrel  75 mg Oral Daily   famotidine  20 mg Oral Daily   furosemide  20 mg Intravenous BID   irbesartan  150 mg Oral Daily   metoprolol tartrate  12.5 mg Oral BID   mometasone-formoterol  2 puff Inhalation BID   pantoprazole  40 mg Oral BID   potassium chloride  20 mEq Oral BID    rosuvastatin  5 mg Oral Daily   Infusions:   heparin 750 Units/hr (05/31/22 1223)   nitroGLYCERIN 25 mcg/min (06/01/22 0551)    Assessment: 17 yoF with history of severe/inoperable CAD, PUD, hepatitis B and C, IBS, asthma, CHF, CKD, and aortic stenosis who presented to the ED with complaints of shortness of breath.  ED EKG: st depression anterior lateral leads.  Troponin elevated (226, 2696, 1995).  Pharmacy consulted to dose IV heparin for ACS/STEMI. No PTA anticoagulation.   Baseline Hgb low at 10.7 and PLT WNL, Coags WNL except D-dimer 1.8 CTa: neg PE  Today, 06/01/2022: Confirmatory Heparin level 0.45, remains therapeutic on heparin 750 units/hr CBC in process No bleeding or complications reported per RN.    Goal of Therapy:  Heparin level 0.3-0.7 units/ml Monitor platelets by anticoagulation protocol: Yes   Plan:  Continue heparin IV infusion at 750 units/hr Daily heparin level and CBC   Lynann Beaver PharmD, BCPS [facility] main pharmacy [phone] 06/01/2022 7:07 AM

## 2022-06-01 NOTE — Progress Notes (Signed)
  Echocardiogram 2D Echocardiogram has been performed.  [name] [name] 06/01/2022, 3:02 PM

## 2022-06-01 NOTE — ED Notes (Signed)
Pt called out c/o feeling sick to her stomach. Pt vomited x1 as soon as I entered her room and appeared to be water and bile. Primary RN aware and to bedside.

## 2022-06-01 NOTE — Progress Notes (Signed)
Pt off BIPAP at this time.  

## 2022-06-01 NOTE — ED Notes (Signed)
Patient asked nurse to call her daughter RN attempted, no answer VM left.

## 2022-06-01 NOTE — Progress Notes (Signed)
Rounding Note    Patient Name: [name] [name] Date of Encounter: 06/01/2022   [facility] Cardiologist: Lance Muss, MD   Subjective   86 year old female with a history of extensive inoperable coronary artery disease.  She presented to the hospital with a non-ST segment elevation myocardial infarction and severe shortness of breath.  She is improved quite a bit on IV nitroglycerin and IV heparin.  I discussed with her husband and her daughter today the fact that she really does not have any good options.  She has done well on IV nitro and heparin.  Not sure how long that she will stay for feeling this well.  I discussed long-term plan of palliative care/hospice care.    Inpatient Medications    Scheduled Meds:  clopidogrel  75 mg Oral Daily   famotidine  20 mg Oral Daily   furosemide  20 mg Intravenous BID   irbesartan  150 mg Oral Daily   metoprolol tartrate  12.5 mg Oral BID   mometasone-formoterol  2 puff Inhalation BID   pantoprazole  40 mg Oral BID   potassium chloride  20 mEq Oral BID   rosuvastatin  5 mg Oral Daily   Continuous Infusions:  heparin 750 Units/hr (05/31/22 1223)   nitroGLYCERIN 25 mcg/min (06/01/22 0551)   PRN Meds: acetaminophen **OR** acetaminophen, albuterol, ondansetron **OR** ondansetron (ZOFRAN) IV, potassium chloride SA, traZODone   Vital Signs    Vitals:   06/01/22 0758 06/01/22 0930 06/01/22 1123 06/01/22 1245  BP:  111/76  111/71  Pulse:  89  72  Resp:  (!) 29  (!) 23  Temp:   98.2 F (36.8 C)   TempSrc:   Oral   SpO2:  97%  98%  Weight: 69.4 kg     Height: 5' (1.524 m)       Intake/Output Summary (Last 24 hours) at 06/01/2022 1320 Last data filed at 06/01/2022 0554 Gross per 24 hour  Intake --  Output 400 ml  Net -400 ml      06/01/2022    7:58 AM 05/10/2022    2:17 PM 04/28/2022    5:26 AM  Last 3 Weights  Weight (lbs) 153 lb 160 lb 6.4 oz 159 lb 8 oz  Weight (kg) 69.4 kg 72.757 kg 72.349 kg       Telemetry     - Personally Reviewed  ECG     - Personally Reviewed  Physical Exam   GEN: Elderly female, no acute distress. Neck: No JVD Cardiac: RRR, no murmurs, rubs, or gallops.  Respiratory: Clear to auscultation bilaterally. GI: Soft, nontender, non-distended  MS: No edema; No deformity. Neuro:  Nonfocal  Psych: Normal affect   Labs    High Sensitivity Troponin:   Recent Labs  Lab 05/31/22 1043 05/31/22 1230 05/31/22 2141 06/01/22 0553  TROPONINIHS 190* 226* 2,696* 1,995*     Chemistry Recent Labs  Lab 05/26/22 0926 05/31/22 1043 05/31/22 2149 06/01/22 1027  NA 143 145  --  142  K 3.8 3.0*  --  4.4  CL 106 117*  --  112*  CO2 22 18*  --  22  GLUCOSE 96 141*  --  124*  BUN 21 25*  --  38*  CREATININE 1.40* 1.33*  --  1.70*  CALCIUM 9.2 8.1*  --  8.9  MG  --   --  2.6*  --   PROT  --  6.3*  --   --   ALBUMIN  --  3.3*  --   --   AST  --  16  --   --   ALT  --  14  --   --   ALKPHOS  --  63  --   --   BILITOT  --  0.9  --   --   GFRNONAA  --  39*  --  29*  ANIONGAP  --  10  --  8    Lipids No results for input(s): "CHOL", "TRIG", "HDL", "LABVLDL", "LDLCALC", "CHOLHDL" in the last 168 hours.  Hematology Recent Labs  Lab 05/31/22 1043 06/01/22 1027  WBC 6.7 11.4*  RBC 3.65* 3.35*  HGB 10.7* 10.0*  HCT 33.7* 31.1*  MCV 92.3 92.8  MCH 29.3 29.9  MCHC 31.8 32.2  RDW 14.8 15.0  PLT 181 176   Thyroid No results for input(s): "TSH", "FREET4" in the last 168 hours.  BNP Recent Labs  Lab 05/31/22 1034  BNP 4,012.1*    DDimer  Recent Labs  Lab 05/31/22 1043  DDIMER 1.82*     Radiology    CT Angio Chest PE W and/or Wo Contrast  Result Date: 05/31/2022 CLINICAL DATA:  COPD, short of breath for 2 days EXAM: CT ANGIOGRAPHY CHEST WITH CONTRAST TECHNIQUE: Multidetector CT imaging of the chest was performed using the standard protocol during bolus administration of intravenous contrast. Multiplanar CT image reconstructions and MIPs were  obtained to evaluate the vascular anatomy. RADIATION DOSE REDUCTION: This exam was performed according to the departmental dose-optimization program which includes automated exposure control, adjustment of the mA and/or kV according to patient size and/or use of iterative reconstruction technique. CONTRAST:  73mL OMNIPAQUE IOHEXOL 350 MG/ML SOLN COMPARISON:  11/04/2009, 05/31/2022 FINDINGS: Cardiovascular: This is a technically adequate evaluation of the pulmonary vasculature. No filling defects or pulmonary emboli. The heart is enlarged. Dilated main pulmonary artery measuring up to 3.4 cm may reflect pulmonary arterial hypertension. There is prominent left ventricular dilatation. Calcification of the mitral and aortic valves. Extensive atherosclerosis throughout the coronary vasculature. No pericardial effusion. Normal caliber of the thoracic aorta. Diffuse aortic atherosclerosis. Evaluation of the aortic lumen is limited due to timing of contrast bolus. Mediastinum/Nodes: No enlarged mediastinal, hilar, or axillary lymph nodes. Thyroid gland, trachea, and esophagus demonstrate no significant findings. Lungs/Pleura: Moderate bilateral free-flowing pleural effusions, less than 1 L each. Dependent consolidation within the bilateral lower lobes, left greater than right, favor atelectasis. No pneumothorax. Central airways are patent. Upper Abdomen: No acute abnormality.  Small hiatal hernia. Musculoskeletal: No acute or destructive bony lesions. Reconstructed images demonstrate no additional findings. Review of the MIP images confirms the above findings. IMPRESSION: 1. No evidence of pulmonary embolus. 2. Cardiomegaly.  Prominent left ventricular dilatation. 3. Dilated main pulmonary artery consistent with pulmonary arterial hypertension. 4. Moderate bilateral free-flowing pleural effusions. Dependent lower lobe consolidation favor compressive atelectasis. 5. Aortic Atherosclerosis (ICD10-I70.0). Coronary artery  atherosclerosis. Electronically Signed   By: Randa Ngo M.D.   On: 05/31/2022 16:46   DG Chest Port 1 View  Result Date: 05/31/2022 CLINICAL DATA:  Dyspnea. EXAM: PORTABLE CHEST 1 VIEW COMPARISON:  Radiographs 11/25/2021 and 05/29/2020.  CT 11/04/2009. FINDINGS: 1049 hours. The heart is enlarged and there is aortic atherosclerosis. The pulmonary vasculature is ill-defined, suspicious for mild edema. There may be small bilateral pleural effusions. No confluent airspace opacity or pneumothorax. No acute osseous findings are evident. Telemetry leads overlie the chest. IMPRESSION: Cardiomegaly with probable mild pulmonary edema and small bilateral pleural effusions. Findings suggest  mild congestive heart failure. Electronically Signed   By: Richardean Sale M.D.   On: 05/31/2022 10:56    Cardiac Studies     Patient Profile     86 y.o. female   [place]    1.  Coronary artery disease: The patient has severe left main and three-vessel coronary artery disease.  Unfortunately, there are no targets for coronary artery artery bypass grafting in the disease is not amenable for percutaneous coronary intervention.  She presented with a non-ST segment elevation myocardial infarction.  She is now better on IV heparin and IV nitro.  I would suggest continued IV nitroglycerin and heparin for a total of 48 hours which will be tomorrow.  Continue Plavix 75 mg a day Will start isosorbide 60 mg today.  Nitroglycerin as needed      For questions or updates, please contact [place] Please consult [email or web address] for contact info under        Signed, Mertie Moores, MD  06/01/2022, 1:20 PM

## 2022-06-01 NOTE — ED Notes (Signed)
Adjusted nitro, BP is soft. No chest pain at this time.

## 2022-06-01 NOTE — Progress Notes (Signed)
[facility] PROGRESS NOTE   [name] [name] [id number] DOB: [birth year] DOA: 05/31/2022  PCP: Leanna Battles (Inactive)  Brief History/Interval Summary: 86 y.o. female with medical history significant of aortic stenosis, osteoarthritis, CAD, ACE inhibitor induced cough, stage 3b CKD, depression, GERD, hepatitis B, hepatitis C, PACs, PVCs, hyperlipidemia, hypertensive heart disease, IBS, obesity, osteoarthritis of the knees with a history of TKR, osteoporosis, pancreatitis, pleural effusion, peptic ulcer disease, sleep apnea not on CPAP who was recently admitted and discharged by cardiology due to angina undergoing cardiac catheterization which showed severe CAD was not amenable to stent placement and the patient is not a candidate for CABG. She presented with complaints of shortness of breath and there was was concern for pulmonary edema. She was hospitalized for further management.    Consultants: Cardiology  Procedures: Echocardiogram is pending    Subjective/Interval History: Patient mentions that she feels better today compared to yesterday.  Denies any chest pain this morning.  Shortness of breath is improved.  Did have some nausea overnight.  Her daughter-in-law is at the bedside.    Assessment/Plan:  Acute on chronic diastolic CHF/pulmonary edema/elevated troponin/acute respiratory failure with hypoxia Patient does have known coronary artery disease.  Unfortunately none of the obstructions are amenable to intervention or CABG. CT angiogram did not show any PE but bilateral pleural effusions were noted. Echocardiogram from June showed EF of 65 to 70%. Might benefit from another echocardiogram to assess cardiac function. Patient currently on heparin infusion and nitroglycerin infusion.  Patient also on furosemide intravenously. Monitor ins and outs Daily weights.  Monitor renal function. Cardiology is following. Currently on nasal cannula oxygen at 2  L/min.  Coronary artery disease As mentioned above she underwent cardiac catheterization recently and is not a candidate for intervention. Medical management being pursued.  Patient is on aspirin and Plavix, beta-blocker, ARB and statin. Patient was also on isosorbide mononitrate prior to admission.  Currently on intravenous nitroglycerin.  Hyperlipidemia Continue statin.  Essential hypertension Continue medications as outlined earlier.  Chronic kidney disease stage IIIb/hypokalemia Renal function has been stable.  Labs are pending from this morning.  GERD Continue famotidine and Protonix.  Sleep apnea Stable.  DVT Prophylaxis: IV heparin Code Status: DNR Family Communication: Discussed with patient and her daughter-in-law Disposition Plan: Hopefully return home when improved  Status is: Inpatient Remains inpatient appropriate because: Pulmonary edema, elevated troponin    Medications: Scheduled:  clopidogrel  75 mg Oral Daily   famotidine  20 mg Oral Daily   furosemide  20 mg Intravenous BID   irbesartan  150 mg Oral Daily   metoprolol tartrate  12.5 mg Oral BID   mometasone-formoterol  2 puff Inhalation BID   pantoprazole  40 mg Oral BID   potassium chloride  20 mEq Oral BID   rosuvastatin  5 mg Oral Daily   Continuous:  heparin 750 Units/hr (05/31/22 1223)   nitroGLYCERIN 25 mcg/min (06/01/22 0551)   [id number] **OR** acetaminophen, albuterol, ondansetron **OR** ondansetron (ZOFRAN) IV, potassium chloride SA, traZODone  Antibiotics: Anti-infectives (From admission, onward)    None       Objective:  Vital Signs  Vitals:   06/01/22 0645 06/01/22 0738 06/01/22 0758 06/01/22 0930  BP: 99/62   111/76  Pulse: 88   89  Resp: (!) 21   (!) 29  Temp:  98.7 F (37.1 C)    TempSrc:  Oral    SpO2: 98%   97%  Weight:   69.4 kg   Height:  5' (1.524 m)     Intake/Output Summary (Last 24 hours) at 06/01/2022 1000 Last data filed at 06/01/2022  0554 Gross per 24 hour  Intake --  Output 400 ml  Net -400 ml   Filed Weights   06/01/22 0758  Weight: 69.4 kg    General appearance: Awake alert.  In no distress Resp: Normal effort at rest.  Diminished air entry at the bases with few crackles.  No wheezing or rhonchi. Cardio: S1-S2 is normal regular.  No S3-S4.  Systolic murmur appreciated over the precordium GI: Abdomen is soft.  Nontender nondistended.  Bowel sounds are present normal.  No masses organomegaly Extremities: No significant peripheral edema noted.  Full range of motion of lower extremities. Neurologic:   No focal neurological deficits.    Lab Results:  Data Reviewed: I have personally reviewed following labs and reports of the imaging studies  CBC: Recent Labs  Lab 05/31/22 1043  WBC 6.7  HGB 10.7*  HCT 33.7*  MCV 92.3  PLT 0000000    Basic Metabolic Panel: Recent Labs  Lab 05/26/22 0926 05/31/22 1043 05/31/22 2149  NA 143 145  --   K 3.8 3.0*  --   CL 106 117*  --   CO2 22 18*  --   GLUCOSE 96 141*  --   BUN 21 25*  --   CREATININE 1.40* 1.33*  --   CALCIUM 9.2 8.1*  --   MG  --   --  2.6*    GFR: Estimated Creatinine Clearance: 27.4 mL/min (A) (by C-G formula based on SCr of 1.33 mg/dL (H)).  Liver Function Tests: Recent Labs  Lab 05/31/22 1043  AST 16  ALT 14  ALKPHOS 63  BILITOT 0.9  PROT 6.3*  ALBUMIN 3.3*    Coagulation Profile: Recent Labs  Lab 05/31/22 1043  INR 1.2     BNP (last 3 results) Recent Labs    04/20/22 1144  PROBNP 1,771*     Recent Results (from the past 240 hour(s))  Resp panel by RT-PCR (RSV, Flu A&B, Covid) Anterior Nasal Swab     Status: None   Collection Time: 05/31/22 10:34 AM   Specimen: Anterior Nasal Swab  Result Value Ref Range Status   SARS Coronavirus 2 by RT PCR NEGATIVE NEGATIVE Final    Comment: (NOTE) SARS-CoV-2 target nucleic acids are NOT DETECTED.  The SARS-CoV-2 RNA is generally detectable in upper respiratory specimens  during the acute phase of infection. The lowest concentration of SARS-CoV-2 viral copies this assay can detect is 138 copies/mL. A negative result does not preclude SARS-Cov-2 infection and should not be used as the sole basis for treatment or other patient management decisions. A negative result may occur with  improper specimen collection/handling, submission of specimen other than nasopharyngeal swab, presence of viral mutation(s) within the areas targeted by this assay, and inadequate number of viral copies(<138 copies/mL). A negative result must be combined with clinical observations, patient history, and epidemiological information. The expected result is Negative.  Fact Sheet for Patients:  EntrepreneurPulse.com.au  Fact Sheet for Healthcare Providers:  IncredibleEmployment.be  This test is no t yet approved or cleared by the [place] FDA and  has been authorized for detection and/or diagnosis of SARS-CoV-2 by FDA under an Emergency Use Authorization (EUA). This EUA will remain  in effect (meaning this test can be used) for the duration of the COVID-19 declaration under Section 564(b)(1) of the Act, 21 U.S.C.section 360bbb-3(b)(1), unless the authorization is  terminated  or revoked sooner.       Influenza A by PCR NEGATIVE NEGATIVE Final   Influenza B by PCR NEGATIVE NEGATIVE Final    Comment: (NOTE) The Xpert Xpress SARS-CoV-2/FLU/RSV plus assay is intended as an aid in the diagnosis of influenza from Nasopharyngeal swab specimens and should not be used as a sole basis for treatment. Nasal washings and aspirates are unacceptable for Xpert Xpress SARS-CoV-2/FLU/RSV testing.  Fact Sheet for Patients: EntrepreneurPulse.com.au  Fact Sheet for Healthcare Providers: IncredibleEmployment.be  This test is not yet approved or cleared by the [place] FDA and has been authorized for detection  and/or diagnosis of SARS-CoV-2 by FDA under an Emergency Use Authorization (EUA). This EUA will remain in effect (meaning this test can be used) for the duration of the COVID-19 declaration under Section 564(b)(1) of the Act, 21 U.S.C. section 360bbb-3(b)(1), unless the authorization is terminated or revoked.     Resp Syncytial Virus by PCR NEGATIVE NEGATIVE Final    Comment: (NOTE) Fact Sheet for Patients: EntrepreneurPulse.com.au  Fact Sheet for Healthcare Providers: IncredibleEmployment.be  This test is not yet approved or cleared by the [place] FDA and has been authorized for detection and/or diagnosis of SARS-CoV-2 by FDA under an Emergency Use Authorization (EUA). This EUA will remain in effect (meaning this test can be used) for the duration of the COVID-19 declaration under Section 564(b)(1) of the Act, 21 U.S.C. section 360bbb-3(b)(1), unless the authorization is terminated or revoked.  Performed at [facility], [place] [address], [place], [place] [address]       Radiology Studies: CT Angio Chest PE W and/or Wo Contrast  Result Date: 05/31/2022 CLINICAL DATA:  COPD, short of breath for 2 days EXAM: CT ANGIOGRAPHY CHEST WITH CONTRAST TECHNIQUE: Multidetector CT imaging of the chest was performed using the standard protocol during bolus administration of intravenous contrast. Multiplanar CT image reconstructions and MIPs were obtained to evaluate the vascular anatomy. RADIATION DOSE REDUCTION: This exam was performed according to the departmental dose-optimization program which includes automated exposure control, adjustment of the mA and/or kV according to patient size and/or use of iterative reconstruction technique. CONTRAST:  58mL OMNIPAQUE IOHEXOL 350 MG/ML SOLN COMPARISON:  11/04/2009, 05/31/2022 FINDINGS: Cardiovascular: This is a technically adequate evaluation of the pulmonary vasculature. No filling defects  or pulmonary emboli. The heart is enlarged. Dilated main pulmonary artery measuring up to 3.4 cm may reflect pulmonary arterial hypertension. There is prominent left ventricular dilatation. Calcification of the mitral and aortic valves. Extensive atherosclerosis throughout the coronary vasculature. No pericardial effusion. Normal caliber of the thoracic aorta. Diffuse aortic atherosclerosis. Evaluation of the aortic lumen is limited due to timing of contrast bolus. Mediastinum/Nodes: No enlarged mediastinal, hilar, or axillary lymph nodes. Thyroid gland, trachea, and esophagus demonstrate no significant findings. Lungs/Pleura: Moderate bilateral free-flowing pleural effusions, less than 1 L each. Dependent consolidation within the bilateral lower lobes, left greater than right, favor atelectasis. No pneumothorax. Central airways are patent. Upper Abdomen: No acute abnormality.  Small hiatal hernia. Musculoskeletal: No acute or destructive bony lesions. Reconstructed images demonstrate no additional findings. Review of the MIP images confirms the above findings. IMPRESSION: 1. No evidence of pulmonary embolus. 2. Cardiomegaly.  Prominent left ventricular dilatation. 3. Dilated main pulmonary artery consistent with pulmonary arterial hypertension. 4. Moderate bilateral free-flowing pleural effusions. Dependent lower lobe consolidation favor compressive atelectasis. 5. Aortic Atherosclerosis (ICD10-I70.0). Coronary artery atherosclerosis. Electronically Signed   By: Randa Ngo M.D.   On: 05/31/2022 16:46  DG Chest Port 1 View  Result Date: 05/31/2022 CLINICAL DATA:  Dyspnea. EXAM: PORTABLE CHEST 1 VIEW COMPARISON:  Radiographs 11/25/2021 and 05/29/2020.  CT 11/04/2009. FINDINGS: 1049 hours. The heart is enlarged and there is aortic atherosclerosis. The pulmonary vasculature is ill-defined, suspicious for mild edema. There may be small bilateral pleural effusions. No confluent airspace opacity or pneumothorax.  No acute osseous findings are evident. Telemetry leads overlie the chest. IMPRESSION: Cardiomegaly with probable mild pulmonary edema and small bilateral pleural effusions. Findings suggest mild congestive heart failure. Electronically Signed   By: Carey Bullocks M.D.   On: 05/31/2022 10:56       LOS: 1 day   Fatma Rutten  [facility] Pager on [email or web address]  06/01/2022, 10:00 AM

## 2022-06-01 NOTE — ED Notes (Signed)
Took pt off bipap for a trial.  Seems to be doing well.  Provider aware of critical trop.

## 2022-06-02 DIAGNOSIS — I25118 Atherosclerotic heart disease of native coronary artery with other forms of angina pectoris: Secondary | ICD-10-CM | POA: Diagnosis not present

## 2022-06-02 DIAGNOSIS — I501 Left ventricular failure: Secondary | ICD-10-CM | POA: Diagnosis not present

## 2022-06-02 DIAGNOSIS — I5043 Acute on chronic combined systolic (congestive) and diastolic (congestive) heart failure: Secondary | ICD-10-CM | POA: Diagnosis not present

## 2022-06-02 DIAGNOSIS — I249 Acute ischemic heart disease, unspecified: Secondary | ICD-10-CM

## 2022-06-02 DIAGNOSIS — N179 Acute kidney failure, unspecified: Secondary | ICD-10-CM | POA: Diagnosis not present

## 2022-06-02 LAB — CBC
HCT: 32.1 % — ABNORMAL LOW (ref 36.0–46.0)
Hemoglobin: 9.8 g/dL — ABNORMAL LOW (ref 12.0–15.0)
MCH: 29.1 pg (ref 26.0–34.0)
MCHC: 30.5 g/dL (ref 30.0–36.0)
MCV: 95.3 fL (ref 80.0–100.0)
Platelets: 167 10*3/uL (ref 150–400)
RBC: 3.37 MIL/uL — ABNORMAL LOW (ref 3.87–5.11)
RDW: 15.2 % (ref 11.5–15.5)
WBC: 9.9 10*3/uL (ref 4.0–10.5)
nRBC: 0 % (ref 0.0–0.2)

## 2022-06-02 LAB — BASIC METABOLIC PANEL
Anion gap: 7 (ref 5–15)
BUN: 35 mg/dL — ABNORMAL HIGH (ref 8–23)
CO2: 24 mmol/L (ref 22–32)
Calcium: 8.8 mg/dL — ABNORMAL LOW (ref 8.9–10.3)
Chloride: 112 mmol/L — ABNORMAL HIGH (ref 98–111)
Creatinine, Ser: 1.91 mg/dL — ABNORMAL HIGH (ref 0.44–1.00)
GFR, Estimated: 26 mL/min — ABNORMAL LOW (ref >60)
Glucose, Bld: 96 mg/dL (ref 70–99)
Potassium: 4.9 mmol/L (ref 3.5–5.1)
Sodium: 143 mmol/L (ref 135–145)

## 2022-06-02 LAB — HEPARIN LEVEL (UNFRACTIONATED): Heparin Unfractionated: 0.33 IU/mL (ref 0.30–0.70)

## 2022-06-02 MED ORDER — ALUM & MAG HYDROXIDE-SIMETH 200-200-20 MG/5ML PO SUSP
30.0000 mL | ORAL | Status: DC | PRN
  Administered 2022-06-02 – 2022-06-04 (×2): 30 mL via ORAL
  Filled 2022-06-02 (×2): qty 30

## 2022-06-02 MED ORDER — ISOSORBIDE MONONITRATE ER 60 MG PO TB24
60.0000 mg | ORAL_TABLET | Freq: Every day | ORAL | Status: DC
  Administered 2022-06-02 – 2022-06-05 (×4): 60 mg via ORAL
  Filled 2022-06-02 (×4): qty 1

## 2022-06-02 NOTE — Progress Notes (Signed)
ANTICOAGULATION CONSULT NOTE - Follow Up Consult  Pharmacy Consult for heparin  Indication: chest pain/ACS  Allergies  Allergen Reactions   Gabapentin Other (See Comments)    Mental confusion   Aspirin Nausea Only   Clarithromycin Other (See Comments)    unknown   Doxycycline Nausea Only   Levaquin [Levofloxacin In D5w] Rash and Other (See Comments)    Headaches   Penicillins Itching and Rash    Other reaction(s): Unknown Has patient had a PCN reaction causing immediate rash, facial/tongue/throat swelling, SOB or lightheadedness with hypotension: No Has patient had a PCN reaction causing severe rash involving mucus membranes or skin necrosis: No Has patient had a PCN reaction that required hospitalization No Has patient had a PCN reaction occurring within the last 10 years: No If all of the above answers are "NO", then may proceed with Cephalosporin use.    Pravastatin Itching    REACTION: itching    Patient Measurements: Height: 5' (152.4 cm) Weight: 70.1 kg (154 lb 8.7 oz) IBW/kg (Calculated) : 45.5 Heparin Dosing Weight: 70 kg  Vital Signs: Temp: 98.1 F (36.7 C) (12/28 0330) Temp Source: Axillary (12/28 0330) BP: 130/68 (12/28 0715) Pulse Rate: 85 (12/28 0715)  Labs: Recent Labs    05/31/22 1043 05/31/22 1230 05/31/22 2141 06/01/22 0553 06/01/22 0800 06/01/22 1027 06/02/22 0329  HGB 10.7*  --   --   --   --  10.0* 9.8*  HCT 33.7*  --   --   --   --  31.1* 32.1*  PLT 181  --   --   --   --  176 167  APTT 29  --   --   --   --   --   --   LABPROT 14.8  --   --   --   --   --   --   INR 1.2  --   --   --   --   --   --   HEPARINUNFRC  --   --  0.42  --  0.45  --  0.33  CREATININE 1.33*  --   --   --   --  1.70* 1.91*  TROPONINIHS 190* 226* 2,696* 1,995*  --   --   --     Estimated Creatinine Clearance: 19.1 mL/min (A) (by C-G formula based on SCr of 1.91 mg/dL (H)).   Assessment: Patient is an 86 y.o F who underwent cardiac cath on 04/29/22 that  showed severe three-vessel CAD and a decision was made for med management since pt was not a surgical or PCI candidate d/t clinical status.  She presented to the ED on 05/31/22 with c/o SOB (Chest CT neg for PE) and was found to have elevated troponin.  She's currently on heparin drip for ACS.  Today, 06/02/2022: - heparin level is therapeutic at 0.33 - hgb down slightly to 9.8, plts 167k - no bleeding documented - cardiology team recom to treat with heparin for 48 hrs (heparin started on 12/26 at noon)    Goal of Therapy:  Heparin level 0.3-0.7 units/ml Monitor platelets by anticoagulation protocol: Yes   Plan:  - continue heparin drip at 750 units/hr - daily heparin level and cbc while on heparin therapy - monitor for s/sx bleeding  Rena Sweeden P 06/02/2022,8:00 AM

## 2022-06-02 NOTE — Progress Notes (Signed)
No bip0ap in room

## 2022-06-02 NOTE — Progress Notes (Signed)
Nitro stopped due to hypotension with systolic in the 90's.  Will continue to monitor and restart as needed. Currently no chest pain and BP in parameters.

## 2022-06-02 NOTE — Progress Notes (Addendum)
Rounding Note    Patient Name: [name] [name] Date of Encounter: 06/02/2022  [place] Cardiologist: Larae Grooms, MD   Subjective   Pt has not complaints, slept well last night. Unaware of bigeminy. Breathing at baseline. Nitro already off.  Inpatient Medications    Scheduled Meds:  Chlorhexidine Gluconate Cloth  6 each Topical Daily   clopidogrel  75 mg Oral Daily   famotidine  20 mg Oral Daily   metoprolol tartrate  12.5 mg Oral BID   mometasone-formoterol  2 puff Inhalation BID   pantoprazole  40 mg Oral BID   rosuvastatin  5 mg Oral Daily   Continuous Infusions:  heparin 750 Units/hr (06/02/22 0328)   nitroGLYCERIN 2 mcg/min (06/02/22 0915)   PRN Meds: acetaminophen **OR** acetaminophen, albuterol, ondansetron **OR** ondansetron (ZOFRAN) IV, traZODone   Vital Signs    Vitals:   06/02/22 0700 06/02/22 0715 06/02/22 0834 06/02/22 0838  BP: 119/72 130/68    Pulse: 77 85    Resp: (!) 22 (!) 24    Temp:      TempSrc:      SpO2: 96% 96% 96% 96%  Weight: 70.1 kg     Height:        Intake/Output Summary (Last 24 hours) at 06/02/2022 0926 Last data filed at 06/02/2022 0915 Gross per 24 hour  Intake 807.31 ml  Output 400 ml  Net 407.31 ml      06/02/2022    7:00 AM 06/01/2022    6:00 PM 06/01/2022    7:58 AM  Last 3 Weights  Weight (lbs) 154 lb 8.7 oz 155 lb 13.8 oz 153 lb  Weight (kg) 70.1 kg 70.7 kg 69.4 kg      Telemetry    Sinus to sinus bradycardia HR 40-60s, ventricular bigeminy - Personally Reviewed  ECG    No new tracings - Personally Reviewed  Physical Exam   GEN: No acute distress.   Neck: No JVD Cardiac: irregular rhythm, regular rate Respiratory: Clear to auscultation bilaterally. GI: Soft, nontender, non-distended  MS: No edema; No deformity. Neuro:  Nonfocal  Psych: Normal affect   Labs    High Sensitivity Troponin:   Recent Labs  Lab 05/31/22 1043 05/31/22 1230 05/31/22 2141 06/01/22 0553   TROPONINIHS 190* 226* 2,696* 1,995*     Chemistry Recent Labs  Lab 05/31/22 1043 05/31/22 2149 06/01/22 1027 06/02/22 0329  NA 145  --  142 143  K 3.0*  --  4.4 4.9  CL 117*  --  112* 112*  CO2 18*  --  22 24  GLUCOSE 141*  --  124* 96  BUN 25*  --  38* 35*  CREATININE 1.33*  --  1.70* 1.91*  CALCIUM 8.1*  --  8.9 8.8*  MG  --  2.6*  --   --   PROT 6.3*  --   --   --   ALBUMIN 3.3*  --   --   --   AST 16  --   --   --   ALT 14  --   --   --   ALKPHOS 63  --   --   --   BILITOT 0.9  --   --   --   GFRNONAA 39*  --  29* 26*  ANIONGAP 10  --  8 7    Lipids No results for input(s): "CHOL", "TRIG", "HDL", "LABVLDL", "LDLCALC", "CHOLHDL" in the last 168 hours.  Hematology Recent Labs  Lab 05/31/22 1043 06/01/22 1027 06/02/22 0329  WBC 6.7 11.4* 9.9  RBC 3.65* 3.35* 3.37*  HGB 10.7* 10.0* 9.8*  HCT 33.7* 31.1* 32.1*  MCV 92.3 92.8 95.3  MCH 29.3 29.9 29.1  MCHC 31.8 32.2 30.5  RDW 14.8 15.0 15.2  PLT 181 176 167   Thyroid No results for input(s): "TSH", "FREET4" in the last 168 hours.  BNP Recent Labs  Lab 05/31/22 1034  BNP 4,012.1*    DDimer  Recent Labs  Lab 05/31/22 1043  DDIMER 1.82*     Radiology    ECHOCARDIOGRAM COMPLETE  Result Date: 06/01/2022    ECHOCARDIOGRAM REPORT   Patient Name:   SHALAN TAHARA Date of Exam: 06/01/2022 Medical Rec #:  [id number]             Height:       60.0 in Accession #:    [id number]            Weight:       153.0 lb Date of Birth:  1935/11/23              BSA:          1.666 m Patient Age:    86 years              BP:           111/76 mmHg Patient Gender: F                     HR:           76 bpm. Exam Location:  Inpatient Procedure: 2D Echo and Intracardiac Opacification Agent Indications:    CHF  History:        Patient has prior history of Echocardiogram examinations, most                 recent 11/26/2021. CAD; Risk Factors:Hypertension and                 Dyslipidemia.  Sonographer:    Harvie Junior Referring  Phys: [phone] [facility]  Sonographer Comments: Technically difficult study due to poor echo windows. Image acquisition challenging due to respiratory motion. IMPRESSIONS  1. Left ventricular ejection fraction, by estimation, is 25 to 30%. The left ventricle has severely decreased function. There is global hypokinesis with akinesis of the inferior and all septal LV segments concerning for multivessel CAD. Left ventricular  diastolic parameters are consistent with Grade II diastolic dysfunction (pseudonormalization). Elevated left atrial pressure.  2. Right ventricular systolic function is moderately reduced. The right ventricular size is not well visualized. There is mildly elevated pulmonary artery systolic pressure.  3. Left atrial size was moderately dilated.  4. The mitral valve is degenerative. There is tethering of the posterior mitral valve leaflet with resultant moderate mitral valve regurgitation. Moderate mitral annular calcification.  5. Moderate-to-severe low flow, low gradient aortic stenosis with mean graident 62mmHg, Vmax 50m/s, AVA 1.0, DI 0.29. The aortic valve is calcified. There is severe calcifcation of the aortic valve. There is severe thickening of the aortic valve. Aortic valve regurgitation is not visualized. Moderate to severe aortic valve stenosis.  6. The inferior vena cava is dilated in size with <50% respiratory variability, suggesting right atrial pressure of 15 mmHg. Comparison(s): Compared to prior TTE on 11/2021, the LVEF has dropped significantly from 65-70% to 25-30% with wall motion abnormalities as described above. FINDINGS  Left Ventricle: There is global hypokinesis with akinesis of the inferior  and all septal LV segments concerning for multivessel CAD. Left ventricular ejection fraction, by estimation, is 25 to 30%. The left ventricle has severely decreased function. Definity contrast agent was given IV to delineate the left ventricular endocardial borders. The left ventricular  internal cavity size was mildly dilated. There is no left ventricular hypertrophy. Left ventricular diastolic parameters are consistent with Grade II diastolic dysfunction (pseudonormalization). Elevated left atrial pressure. Right Ventricle: The right ventricular size is not well visualized. Right vetricular wall thickness was not well visualized. Right ventricular systolic function is moderately reduced. There is mildly elevated pulmonary artery systolic pressure. The tricuspid regurgitant velocity is 2.89 m/s, and with an assumed right atrial pressure of 3 mmHg, the estimated right ventricular systolic pressure is 36.4 mmHg. Left Atrium: Left atrial size was moderately dilated. Right Atrium: Right atrial size was normal in size. Pericardium: There is no evidence of pericardial effusion. Mitral Valve: The mitral valve is degenerative in appearance. There is moderate thickening of the mitral valve leaflet(s). There is mild calcification of the mitral valve leaflet(s). Moderate mitral annular calcification. There is tethering of the posterior mitral valve leaflet with resultant moderate mitral valve regurgitation. MV peak gradient, 6.2 mmHg. The mean mitral valve gradient is 2.0 mmHg. Tricuspid Valve: The tricuspid valve is grossly normal. Tricuspid valve regurgitation is trivial. Aortic Valve: Moderate-to-severe low flow, low gradient aortic stenosis with mean graident , Vmax 47m/s, AVA 1.0, DI 0.29. The aortic valve is calcified. There is severe calcifcation of the aortic valve. There is severe thickening of the aortic valve. Aortic valve regurgitation is not visualized. Moderate to severe aortic stenosis is present. Aortic valve mean gradient measures 9.0 mmHg. Aortic valve peak gradient measures 15.1 mmHg. Aortic valve area, by VTI measures 0.92 cm. Pulmonic Valve: The pulmonic valve was not well visualized. Pulmonic valve regurgitation is trivial. Aorta: The aortic root is normal in size and structure.  Venous: The inferior vena cava is dilated in size with less than 50% respiratory variability, suggesting right atrial pressure of 15 mmHg. IAS/Shunts: The atrial septum is grossly normal.  LEFT VENTRICLE PLAX 2D LVIDd:         4.70 cm      Diastology LVIDs:         4.10 cm      LV e' medial:    5.33 cm/s LV PW:         1.10 cm      LV E/e' medial:  22.1 LV IVS:        1.10 cm      LV e' lateral:   6.64 cm/s LVOT diam:     1.90 cm      LV E/e' lateral: 17.8 LV SV:         35 LV SV Index:   21 LVOT Area:     2.84 cm  LV Volumes (MOD) LV vol d, MOD A2C: 173.0 ml LV vol d, MOD A4C: 127.0 ml LV vol s, MOD A2C: 131.0 ml LV vol s, MOD A4C: 89.6 ml LV SV MOD A2C:     42.0 ml LV SV MOD A4C:     127.0 ml LV SV MOD BP:      42.7 ml RIGHT VENTRICLE RV Basal diam:  3.80 cm RV Mid diam:    3.40 cm RV S prime:     7.94 cm/s TAPSE (M-mode): 1.6 cm LEFT ATRIUM             Index  RIGHT ATRIUM           Index LA diam:        3.30 cm 1.98 cm/m   RA Area:     12.80 cm LA Vol (A2C):   56.2 ml 33.74 ml/m  RA Volume:   29.30 ml  17.59 ml/m LA Vol (A4C):   70.9 ml 42.56 ml/m LA Biplane Vol: 66.2 ml 39.74 ml/m  AORTIC VALVE                     PULMONIC VALVE AV Area (Vmax):    0.91 cm      PV Vmax:       1.28 m/s AV Area (Vmean):   0.91 cm      PV Peak grad:  6.6 mmHg AV Area (VTI):     0.92 cm AV Vmax:           194.00 cm/s AV Vmean:          137.500 cm/s AV VTI:            0.381 m AV Peak Grad:      15.1 mmHg AV Mean Grad:      9.0 mmHg LVOT Vmax:         62.10 cm/s LVOT Vmean:        43.900 cm/s LVOT VTI:          0.123 m LVOT/AV VTI ratio: 0.32  AORTA Ao Root diam: 3.10 cm Ao Asc diam:  2.90 cm MITRAL VALVE                  TRICUSPID VALVE MV Area (PHT): 4.63 cm       TR Peak grad:   33.4 mmHg MV Area VTI:   1.11 cm       TR Vmax:        289.00 cm/s MV Peak grad:  6.2 mmHg MV Mean grad:  2.0 mmHg       SHUNTS MV Vmax:       1.25 m/s       Systemic VTI:  0.12 m MV Vmean:      60.4 cm/s      Systemic Diam: 1.90 cm MV  Decel Time: 164 msec MR Peak grad:    73.4 mmHg MR Mean grad:    47.0 mmHg MR Vmax:         428.50 cm/s MR Vmean:        329.0 cm/s MR PISA:         3.08 cm MR PISA Eff ROA: 20 mm MR PISA Radius:  0.70 cm MV E velocity: 118.00 cm/s MV A velocity: 82.50 cm/s MV E/A ratio:  1.43 Gwyndolyn Kaufman MD Electronically signed by Gwyndolyn Kaufman MD Signature Date/Time: 06/01/2022/5:19:45 PM    Final    CT Angio Chest PE W and/or Wo Contrast  Result Date: 05/31/2022 CLINICAL DATA:  COPD, short of breath for 2 days EXAM: CT ANGIOGRAPHY CHEST WITH CONTRAST TECHNIQUE: Multidetector CT imaging of the chest was performed using the standard protocol during bolus administration of intravenous contrast. Multiplanar CT image reconstructions and MIPs were obtained to evaluate the vascular anatomy. RADIATION DOSE REDUCTION: This exam was performed according to the departmental dose-optimization program which includes automated exposure control, adjustment of the mA and/or kV according to patient size and/or use of iterative reconstruction technique. CONTRAST:  89mL OMNIPAQUE IOHEXOL 350 MG/ML SOLN COMPARISON:  11/04/2009, 05/31/2022 FINDINGS: Cardiovascular: This is a technically adequate evaluation  of the pulmonary vasculature. No filling defects or pulmonary emboli. The heart is enlarged. Dilated main pulmonary artery measuring up to 3.4 cm may reflect pulmonary arterial hypertension. There is prominent left ventricular dilatation. Calcification of the mitral and aortic valves. Extensive atherosclerosis throughout the coronary vasculature. No pericardial effusion. Normal caliber of the thoracic aorta. Diffuse aortic atherosclerosis. Evaluation of the aortic lumen is limited due to timing of contrast bolus. Mediastinum/Nodes: No enlarged mediastinal, hilar, or axillary lymph nodes. Thyroid gland, trachea, and esophagus demonstrate no significant findings. Lungs/Pleura: Moderate bilateral free-flowing pleural effusions, less  than 1 L each. Dependent consolidation within the bilateral lower lobes, left greater than right, favor atelectasis. No pneumothorax. Central airways are patent. Upper Abdomen: No acute abnormality.  Small hiatal hernia. Musculoskeletal: No acute or destructive bony lesions. Reconstructed images demonstrate no additional findings. Review of the MIP images confirms the above findings. IMPRESSION: 1. No evidence of pulmonary embolus. 2. Cardiomegaly.  Prominent left ventricular dilatation. 3. Dilated main pulmonary artery consistent with pulmonary arterial hypertension. 4. Moderate bilateral free-flowing pleural effusions. Dependent lower lobe consolidation favor compressive atelectasis. 5. Aortic Atherosclerosis (ICD10-I70.0). Coronary artery atherosclerosis. Electronically Signed   By: Randa Ngo M.D.   On: 05/31/2022 16:46   DG Chest Port 1 View  Result Date: 05/31/2022 CLINICAL DATA:  Dyspnea. EXAM: PORTABLE CHEST 1 VIEW COMPARISON:  Radiographs 11/25/2021 and 05/29/2020.  CT 11/04/2009. FINDINGS: 1049 hours. The heart is enlarged and there is aortic atherosclerosis. The pulmonary vasculature is ill-defined, suspicious for mild edema. There may be small bilateral pleural effusions. No confluent airspace opacity or pneumothorax. No acute osseous findings are evident. Telemetry leads overlie the chest. IMPRESSION: Cardiomegaly with probable mild pulmonary edema and small bilateral pleural effusions. Findings suggest mild congestive heart failure. Electronically Signed   By: Richardean Sale M.D.   On: 05/31/2022 10:56    Cardiac Studies   Echo 06/01/22: 1. Left ventricular ejection fraction, by estimation, is 25 to 30%. The  left ventricle has severely decreased function. There is global  hypokinesis with akinesis of the inferior and all septal LV segments  concerning for multivessel CAD. Left ventricular   diastolic parameters are consistent with Grade II diastolic dysfunction   (pseudonormalization). Elevated left atrial pressure.   2. Right ventricular systolic function is moderately reduced. The right  ventricular size is not well visualized. There is mildly elevated  pulmonary artery systolic pressure.   3. Left atrial size was moderately dilated.   4. The mitral valve is degenerative. There is tethering of the posterior  mitral valve leaflet with resultant moderate mitral valve regurgitation.  Moderate mitral annular calcification.   5. Moderate-to-severe low flow, low gradient aortic stenosis with mean  graident 32mmHg, Vmax 90m/s, AVA 1.0, DI 0.29. The aortic valve is  calcified. There is severe calcifcation of the aortic valve. There is  severe thickening of the aortic valve. Aortic  valve regurgitation is not visualized. Moderate to severe aortic valve  stenosis.   6. The inferior vena cava is dilated in size with <50% respiratory  variability, suggesting right atrial pressure of 15 mmHg.    Heart cath 04/26/22:   Mid LM to Dist LM lesion is 90% stenosed.   Ost Cx to Prox Cx lesion is 75% stenosed.   1st Mrg lesion is 70% stenosed.   Prox LAD to Mid LAD lesion is 90% stenosed.   Dist LAD lesion is 100% stenosed.   Mid RCA lesion is 100% stenosed.   LV end diastolic pressure  is normal.  LVEDP 15 mmHg.   There is no aortic valve stenosis.   Severe, near continuous belching during the case while lying flat.   Aortic saturation 88%, PA saturation 67%, PA pressure 25/11, mean PA pressure 18 mmHg, mean pulmonary capillary wedge pressure 17 mmHg, cardiac output 6.72 L/min, cardiac index 3.95.  Mean right atrial pressure 5 mmHg.   Essentially normal right heart pressures after recent diuresis due to elevated BNP.   Severe, three-vessel coronary artery disease.  Significant progression of her CAD compared to her 2009 catheterization.   I do not think she would be a surgical candidate due to overall frailty and age.  No good PCI options.  Results conveyed by  phone to the daughter.   Of note, she was having significant reflux, belching during the procedure while lying flat.  She was treated with Zofran.  In talking to her daughter, this is her biggest complaint.  She cannot sleep at night.  She has to sit up to avoid these symptoms.   Her next issue is when she tries to exert, she has chest discomfort.  This is more cardiac pain.  Will discuss with interventional colleagues but I do not see any targets for PCI given the diffuse nature of her severe CAD.  Would consider palliative care.   Will watch overnight in the hospital to help with medicine titration and possible palliative care consult.  Patient Profile     86 y.o. female  with a hx of extensive coronary artery disease without good interventional targets, moderate AS and mild-moderate MR by echo 11/2021 (but no AS by cath 04/2022), chronic HFpEF, HTN, PUD, HBV, HCV, GERD, PACs, PVCs, chronic cough, HLD (intolerant of statins), pancreatitis, obesity, RA, normocytic anemia, CKD stage 3b who is being seen 05/31/2022 for the evaluation of chest pain/elevated troponin at the request of Dr. Tomi Bamberger.   Assessment & Plan    CAD NSTEMI - Severe left main and 3v disease without targets for CABG or PCI - Suspect NSTEMI due to severe disease and acute respiratory failure requiring BiPAP - NSTEMI treated with IV nitro and heparin 48 hrs of IV nitro and heparin completed as of noon today - D/C both and monitor - Continue 75 mg plavix - Continue imdur, BB - Consider increasing imdur vs amlodipine vs ranexa - No current chest pain off of nitro gtt - HST peaked at 2696 before down-trending   Acute systolic heart failure Chronic diastolic heart failure Mild to moderate MR Hypertension - echo yesterday with LVEF 25-30% - down from normal in June - LVEDP was 15 mmHg at the time of hear heart cath last month - question if this is ischemic vs possibly PVC-mediated cardiomyopathy given her frequency of  bigeminy on telemetry - BNP 4012 at admission - she slept flat last night and does not appear volume up today after targeted doses of diuresis - GDMT: BB, imdur - given rising creatinine - no ACEI/ARB/ARNI/MRA - consider adding hydralazine - no BP room today - will likely need at least PRN diuretic if not scheduled diuretic at discharge - will need to watch renal function   Ventricular bigeminy Given bradycardia, not a lot of room to titrate BB - K 4.9 - Mg 2.6 - consider checking TSH   Hyperlipidemia intolerant to statins - on 5 mg crestor   A on CKD stage 3b - sCr 1.91 - creatinine rising - fluctuates between 1.0-1.4 - holding off on further diuresis  For questions or updates, please contact [place] [address] Please consult [email or web address] for contact info under        Signed, Ledora Bottcher, PA  06/02/2022, 9:26 AM     Attending Note:   The patient was seen and examined.  Agree with assessment and plan as noted above.  Changes made to the above note as needed.  Patient seen and independently examined with Angie Duke .   We discussed all aspects of the encounter. I agree with the assessment and plan as stated above.     CAD :   Feels better after 48 hours of heparin and nitro.  The nitroglycerin drip is off because of hypotension.  Will see if she tolerates isosorbide  60  Ambulate today  Heparin drip is to stop around noon  If she does ok, I anticipate that she can go home tomorrow   2.  Ventricular bigeminy Better , is now having occasional PVCs . Could be due to coronary ischemia   Cont nitrates ( indur) , plavix, metoprolol 12.5 BID   Will follow up with Dr. Irish Lack     I have spent a total of 40 minutes with patient reviewing hospital  notes , telemetry, EKGs, labs and examining patient as well as establishing an assessment and plan that was discussed with the patient.  > 50% of time was spent in direct patient care.    Thayer Headings,  Brooke Bonito., MD, [facility] 06/02/2022, 11:00 AM [address],  [place] Pager [phone]

## 2022-06-02 NOTE — TOC Initial Note (Signed)
Transition of Care [facility]) - Initial/Assessment Note    Patient Details  Name: [name] [name] MRN: [id number] Date of Birth: 1936/06/05  [facility] CM/SW Contact:    Lavenia Atlas, RN Phone Number: 06/02/2022, 7:13 PM  Clinical Narrative:    Received TOC consult for HF screening. Per chart review patient is not appropriate for CHF protocol.   TOC will continue to follow for needs.                   Barriers to Discharge: Continued Medical Work up   Patient Goals and CMS Choice            Expected Discharge Plan and Services In-house Referral: NA Discharge Planning Services: CM Consult   Living arrangements for the past 2 months: Single Family Home                 DME Arranged: N/A DME Agency: NA       HH Arranged: NA HH Agency: NA        Prior Living Arrangements/Services Living arrangements for the past 2 months: Single Family Home Lives with:: Spouse                   Activities of Daily Living Home Assistive Devices/Equipment: Cane (specify quad or straight), Nebulizer, Dentures (specify type) ADL Screening (condition at time of admission) Patient's cognitive ability adequate to safely complete daily activities?: Yes Is the patient deaf or have difficulty hearing?: No Does the patient have difficulty seeing, even when wearing glasses/contacts?: No Does the patient have difficulty concentrating, remembering, or making decisions?: No Patient able to express need for assistance with ADLs?: Yes Does the patient have difficulty dressing or bathing?: No Independently performs ADLs?: Yes (appropriate for developmental age) Does the patient have difficulty walking or climbing stairs?: No Weakness of Legs: None Weakness of Arms/Hands: None  Permission Sought/Granted                  Emotional Assessment         Alcohol / Substance Use: Not Applicable Psych Involvement: No (comment)  Admission diagnosis:  Hypokalemia  [E87.6] Pulmonary edema cardiac cause (HCC) [I50.1] ACS (acute coronary syndrome) (HCC) [I24.9] Acute congestive heart failure, unspecified heart failure type [facility]) [I50.9] Patient Active Problem List   Diagnosis Date Noted   ACS (acute coronary syndrome) (HCC) 06/02/2022   Pulmonary edema cardiac cause (HCC) 05/31/2022   CAD (coronary artery disease) 04/26/2022   Chest pain 11/26/2021   Chronic heart failure with preserved ejection fraction (HCC)    Stage 3b chronic kidney disease (CKD) (HCC)    Elevated troponin    Central retinal vein occlusion with macular edema of right eye 05/20/2020   Retinal microaneurysm of right eye 05/20/2020   Nuclear sclerotic cataract of right eye 05/20/2020   Nuclear sclerotic cataract of left eye 05/20/2020   Retinal hemorrhage of right eye 05/20/2020   Statin myopathy 03/08/2019   Moderate aortic stenosis 07/13/2017   Hypertensive heart disease    Coronary artery disease involving native coronary artery with angina pectoris (HCC)    Hyperlipidemia LDL goal <70    Morbid obesity due to excess calories (HCC)    Dyspnea 12/23/2013   Sleep apnea    PVC (premature ventricular contraction)    Rheumatoid arthritis(714.0) 12/29/2011   Shoulder pain 08/29/2011   Cough variant asthma vs UACS with VCD  12/10/2009   Upper airway cough syndrome 02/13/2009   MURMUR 08/21/2008  KNEE PAIN 05/26/2007   HEPATITIS C 02/06/2007   Hyperlipidemia 02/06/2007   DEPRESSION 02/06/2007   Hypertension 02/06/2007   IRRITABLE BOWEL SYNDROME 02/06/2007   OSTEOPOROSIS 02/06/2007   PANCREATITIS, HX OF 02/06/2007   Anxiety state 12/22/2006   GERD 12/22/2006   HEPATITIS B, HX OF 12/22/2006   PCP:  Leanna Battles (Inactive) Pharmacy:   [facility] - [place], [place] [address] [place] [address] Phone: [phone] Fax: [phone]  [facility] - [place], [place] - [address] [place] [address] Phone: [phone] Fax: [phone]     Social Determinants of Health (SDOH) Social History: [place]: No Food Insecurity (06/01/2022)  Housing: Low Risk  (06/01/2022)  Transportation Needs: No Transportation Needs (06/01/2022)  Utilities: Not At Risk (06/01/2022)  Depression (PHQ2-9): Low Risk  (05/16/2022)  Physical Activity: Inactive (05/16/2022)  Tobacco Use: Low Risk  (06/01/2022)   SDOH Interventions:     Readmission Risk Interventions     No data to display

## 2022-06-02 NOTE — Progress Notes (Signed)
[facility] PROGRESS NOTE   Deede Heskett KTG:[id number] DOB: 08-28-1935 DOA: 05/31/2022  PCP: Jarome Matin (Inactive)  Brief History/Interval Summary: 86 y.o. female with medical history significant of aortic stenosis, osteoarthritis, CAD, ACE inhibitor induced cough, stage 3b CKD, depression, GERD, hepatitis B, hepatitis C, PACs, PVCs, hyperlipidemia, hypertensive heart disease, IBS, obesity, osteoarthritis of the knees with a history of TKR, osteoporosis, pancreatitis, pleural effusion, peptic ulcer disease, sleep apnea not on CPAP who was recently admitted and discharged by cardiology due to angina undergoing cardiac catheterization which showed severe CAD was not amenable to stent placement and the patient is not a candidate for CABG. She presented with complaints of shortness of breath and there was was concern for pulmonary edema. She was hospitalized for further management.    Consultants: Cardiology  Procedures: Echocardiogram     Subjective/Interval History: Patient denies any chest pain this morning.  Shortness of breath is improved.  She does feel fatigued.  Denies any nausea or vomiting.  Her sister-in-law is at the bedside.     Assessment/Plan:  Acute on chronic combined systolic and diastolic CHF/elevated troponin/acute respiratory failure with hypoxia Patient does have known coronary artery disease.  Unfortunately none of the obstructions are amenable to intervention or CABG. CT angiogram did not show any PE but bilateral pleural effusions were noted. Previous echocardiogram from June showed EF of 65 to 70%. Echocardiogram was repeated and it shows that her systolic function has decreased significantly.  Her LVEF is now 25 to 30%.  She has global hypokinesis.  She has grade 2 diastolic dysfunction.  She has moderate to severe aortic stenosis. Rising creatinine noted today to 1.91.  Will discontinue her ARB and hold her furosemide.  Wait for further input  from cardiology. Patient currently on heparin infusion and nitroglycerin infusion.   Monitor ins and outs Daily weights.   Currently on nasal cannula oxygen at 2 L/min.  Try to wean off if possible.  Coronary artery disease As mentioned above she underwent cardiac catheterization recently and is not a candidate for intervention. Medical management being pursued.  Patient is on aspirin and Plavix, beta-blocker, ARB and statin. ARB will be held due to rising creatinine. Patient was also on isosorbide mononitrate prior to admission.  Currently on intravenous nitroglycerin.  Hyperlipidemia Continue statin.  Essential hypertension Continue medications as outlined earlier.  Blood pressure is reasonably well-controlled.  Low blood pressure readings noted overnight.  Hopefully she can come off of nitroglycerin infusion today.  ARB has been held due to elevated creatinine.  Acute on chronic kidney disease stage IIIb/hypokalemia Baseline creatinine seems to be around 1-1.5.  Increasing creatinine likely due to diuretics.  Furosemide has been held.  ARB has been discontinued for now.  Monitor urine output.  Avoid nephrotoxic agents.  Check labs again tomorrow.    Normocytic anemia Likely anemia of chronic disease.  No evidence of overt bleeding.  GERD Continue famotidine and Protonix.  Sleep apnea Stable.  DVT Prophylaxis: IV heparin Code Status: DNR Family Communication: Discussed with patient and her sister-in-law Disposition Plan: Hopefully return home when improved. start mobilizing.  Status is: Inpatient Remains inpatient appropriate because: Pulmonary edema, elevated troponin    Medications: Scheduled:  Chlorhexidine Gluconate Cloth  6 each Topical Daily   clopidogrel  75 mg Oral Daily   famotidine  20 mg Oral Daily   metoprolol tartrate  12.5 mg Oral BID   mometasone-formoterol  2 puff Inhalation BID   pantoprazole  40 mg Oral  BID   rosuvastatin  5 mg Oral Daily    Continuous:  heparin 750 Units/hr (06/02/22 0328)   nitroGLYCERIN 2 mcg/min (06/02/22 0915)   OZD:[id number] **OR** acetaminophen, albuterol, ondansetron **OR** ondansetron (ZOFRAN) IV, traZODone  Antibiotics: Anti-infectives (From admission, onward)    None       Objective:  Vital Signs  Vitals:   06/02/22 0700 06/02/22 0715 06/02/22 0834 06/02/22 0838  BP: 119/72 130/68    Pulse: 77 85    Resp: (!) 22 (!) 24    Temp:      TempSrc:      SpO2: 96% 96% 96% 96%  Weight: 70.1 kg     Height:        Intake/Output Summary (Last 24 hours) at 06/02/2022 0923 Last data filed at 06/02/2022 0915 Gross per 24 hour  Intake 807.31 ml  Output 400 ml  Net 407.31 ml    Filed Weights   06/01/22 0758 06/01/22 1800 06/02/22 0700  Weight: 69.4 kg 70.7 kg 70.1 kg    General appearance: Awake alert.  In no distress Resp: Improved aeration bilaterally.  No crackles appreciated today.  No wheezing. Cardio: S1-S2 is normal regular.  No S3-S4.  No rubs murmurs or bruit GI: Abdomen is soft.  Nontender nondistended.  Bowel sounds are present normal.  No masses organomegaly Extremities: No edema.  Full range of motion of lower extremities. Neurologic: Alert and oriented x3.  No focal neurological deficits.    Lab Results:  Data Reviewed: I have personally reviewed following labs and reports of the imaging studies  CBC: Recent Labs  Lab 05/31/22 1043 06/01/22 1027 06/02/22 0329  WBC 6.7 11.4* 9.9  HGB 10.7* 10.0* 9.8*  HCT 33.7* 31.1* 32.1*  MCV 92.3 92.8 95.3  PLT 181 176 167     Basic Metabolic Panel: Recent Labs  Lab 05/26/22 0926 05/31/22 1043 05/31/22 2149 06/01/22 1027 06/02/22 0329  NA 143 145  --  142 143  K 3.8 3.0*  --  4.4 4.9  CL 106 117*  --  112* 112*  CO2 22 18*  --  22 24  GLUCOSE 96 141*  --  124* 96  BUN 21 25*  --  38* 35*  CREATININE 1.40* 1.33*  --  1.70* 1.91*  CALCIUM 9.2 8.1*  --  8.9 8.8*  MG  --   --  2.6*  --   --       GFR: Estimated Creatinine Clearance: 19.1 mL/min (A) (by C-G formula based on SCr of 1.91 mg/dL (H)).  Liver Function Tests: Recent Labs  Lab 05/31/22 1043  AST 16  ALT 14  ALKPHOS 63  BILITOT 0.9  PROT 6.3*  ALBUMIN 3.3*     Coagulation Profile: Recent Labs  Lab 05/31/22 1043  INR 1.2      BNP (last 3 results) Recent Labs    04/20/22 1144  PROBNP 1,771*      Recent Results (from the past 240 hour(s))  Resp panel by RT-PCR (RSV, Flu A&B, Covid) Anterior Nasal Swab     Status: None   Collection Time: 05/31/22 10:34 AM   Specimen: Anterior Nasal Swab  Result Value Ref Range Status   SARS Coronavirus 2 by RT PCR NEGATIVE NEGATIVE Final    Comment: (NOTE) SARS-CoV-2 target nucleic acids are NOT DETECTED.  The SARS-CoV-2 RNA is generally detectable in upper respiratory specimens during the acute phase of infection. The lowest concentration of SARS-CoV-2 viral copies this assay can detect is  138 copies/mL. A negative result does not preclude SARS-Cov-2 infection and should not be used as the sole basis for treatment or other patient management decisions. A negative result may occur with  improper specimen collection/handling, submission of specimen other than nasopharyngeal swab, presence of viral mutation(s) within the areas targeted by this assay, and inadequate number of viral copies(<138 copies/mL). A negative result must be combined with clinical observations, patient history, and epidemiological information. The expected result is Negative.  Fact Sheet for Patients:  EntrepreneurPulse.com.au  Fact Sheet for Healthcare Providers:  IncredibleEmployment.be  This test is no t yet approved or cleared by the [place] FDA and  has been authorized for detection and/or diagnosis of SARS-CoV-2 by FDA under an Emergency Use Authorization (EUA). This EUA will remain  in effect (meaning this test can be used) for the  duration of the COVID-19 declaration under Section 564(b)(1) of the Act, 21 U.S.C.section 360bbb-3(b)(1), unless the authorization is terminated  or revoked sooner.       Influenza A by PCR NEGATIVE NEGATIVE Final   Influenza B by PCR NEGATIVE NEGATIVE Final    Comment: (NOTE) The Xpert Xpress SARS-CoV-2/FLU/RSV plus assay is intended as an aid in the diagnosis of influenza from Nasopharyngeal swab specimens and should not be used as a sole basis for treatment. Nasal washings and aspirates are unacceptable for Xpert Xpress SARS-CoV-2/FLU/RSV testing.  Fact Sheet for Patients: EntrepreneurPulse.com.au  Fact Sheet for Healthcare Providers: IncredibleEmployment.be  This test is not yet approved or cleared by the [place] FDA and has been authorized for detection and/or diagnosis of SARS-CoV-2 by FDA under an Emergency Use Authorization (EUA). This EUA will remain in effect (meaning this test can be used) for the duration of the COVID-19 declaration under Section 564(b)(1) of the Act, 21 U.S.C. section 360bbb-3(b)(1), unless the authorization is terminated or revoked.     Resp Syncytial Virus by PCR NEGATIVE NEGATIVE Final    Comment: (NOTE) Fact Sheet for Patients: EntrepreneurPulse.com.au  Fact Sheet for Healthcare Providers: IncredibleEmployment.be  This test is not yet approved or cleared by the [place] FDA and has been authorized for detection and/or diagnosis of SARS-CoV-2 by FDA under an Emergency Use Authorization (EUA). This EUA will remain in effect (meaning this test can be used) for the duration of the COVID-19 declaration under Section 564(b)(1) of the Act, 21 U.S.C. section 360bbb-3(b)(1), unless the authorization is terminated or revoked.  Performed at [facility], [place] [address], [place], [place] [address]   MRSA Next Gen by PCR, Nasal     Status: None    Collection Time: 06/01/22  6:38 PM   Specimen: Nasal Mucosa; Nasal Swab  Result Value Ref Range Status   MRSA by PCR Next Gen NOT DETECTED NOT DETECTED Final    Comment: (NOTE) The GeneXpert MRSA Assay (FDA approved for NASAL specimens only), is one component of a comprehensive MRSA colonization surveillance program. It is not intended to diagnose MRSA infection nor to guide or monitor treatment for MRSA infections. Test performance is not FDA approved in patients less than 53 years old. Performed at [facility], [place] [address] [place], [place] [address]       Radiology Studies: ECHOCARDIOGRAM COMPLETE  Result Date: 06/01/2022    ECHOCARDIOGRAM REPORT   Patient Name:   ANGELICAMARIA TAO Date of Exam: 06/01/2022 Medical Rec #:  [id number]             Height:  60.0 in Accession #:    [id number]            Weight:       153.0 lb Date of Birth:  June 15, 1935              BSA:          1.666 m Patient Age:    72 years              BP:           111/76 mmHg Patient Gender: F                     HR:           76 bpm. Exam Location:  Inpatient Procedure: 2D Echo and Intracardiac Opacification Agent Indications:    CHF  History:        Patient has prior history of Echocardiogram examinations, most                 recent 11/26/2021. CAD; Risk Factors:Hypertension and                 Dyslipidemia.  Sonographer:    Harvie Junior Referring Phys: [phone] [facility]  Sonographer Comments: Technically difficult study due to poor echo windows. Image acquisition challenging due to respiratory motion. IMPRESSIONS  1. Left ventricular ejection fraction, by estimation, is 25 to 30%. The left ventricle has severely decreased function. There is global hypokinesis with akinesis of the inferior and all septal LV segments concerning for multivessel CAD. Left ventricular  diastolic parameters are consistent with Grade II diastolic dysfunction (pseudonormalization). Elevated left atrial pressure.   2. Right ventricular systolic function is moderately reduced. The right ventricular size is not well visualized. There is mildly elevated pulmonary artery systolic pressure.  3. Left atrial size was moderately dilated.  4. The mitral valve is degenerative. There is tethering of the posterior mitral valve leaflet with resultant moderate mitral valve regurgitation. Moderate mitral annular calcification.  5. Moderate-to-severe low flow, low gradient aortic stenosis with mean graident 60mmHg, Vmax 50m/s, AVA 1.0, DI 0.29. The aortic valve is calcified. There is severe calcifcation of the aortic valve. There is severe thickening of the aortic valve. Aortic valve regurgitation is not visualized. Moderate to severe aortic valve stenosis.  6. The inferior vena cava is dilated in size with <50% respiratory variability, suggesting right atrial pressure of 15 mmHg. Comparison(s): Compared to prior TTE on 11/2021, the LVEF has dropped significantly from 65-70% to 25-30% with wall motion abnormalities as described above. FINDINGS  Left Ventricle: There is global hypokinesis with akinesis of the inferior and all septal LV segments concerning for multivessel CAD. Left ventricular ejection fraction, by estimation, is 25 to 30%. The left ventricle has severely decreased function. Definity contrast agent was given IV to delineate the left ventricular endocardial borders. The left ventricular internal cavity size was mildly dilated. There is no left ventricular hypertrophy. Left ventricular diastolic parameters are consistent with Grade II diastolic dysfunction (pseudonormalization). Elevated left atrial pressure. Right Ventricle: The right ventricular size is not well visualized. Right vetricular wall thickness was not well visualized. Right ventricular systolic function is moderately reduced. There is mildly elevated pulmonary artery systolic pressure. The tricuspid regurgitant velocity is 2.89 m/s, and with an assumed right atrial  pressure of 3 mmHg, the estimated right ventricular systolic pressure is [id number] mmHg. Left Atrium: Left atrial size was moderately dilated. Right Atrium: Right atrial size was normal in size.  Pericardium: There is no evidence of pericardial effusion. Mitral Valve: The mitral valve is degenerative in appearance. There is moderate thickening of the mitral valve leaflet(s). There is mild calcification of the mitral valve leaflet(s). Moderate mitral annular calcification. There is tethering of the posterior mitral valve leaflet with resultant moderate mitral valve regurgitation. MV peak gradient, 6.2 mmHg. The mean mitral valve gradient is 2.0 mmHg. Tricuspid Valve: The tricuspid valve is grossly normal. Tricuspid valve regurgitation is trivial. Aortic Valve: Moderate-to-severe low flow, low gradient aortic stenosis with mean graident 71mmHg, Vmax 7m/s, AVA 1.0, DI 0.29. The aortic valve is calcified. There is severe calcifcation of the aortic valve. There is severe thickening of the aortic valve. Aortic valve regurgitation is not visualized. Moderate to severe aortic stenosis is present. Aortic valve mean gradient measures 9.0 mmHg. Aortic valve peak gradient measures 15.1 mmHg. Aortic valve area, by VTI measures 0.92 cm. Pulmonic Valve: The pulmonic valve was not well visualized. Pulmonic valve regurgitation is trivial. Aorta: The aortic root is normal in size and structure. Venous: The inferior vena cava is dilated in size with less than 50% respiratory variability, suggesting right atrial pressure of 15 mmHg. IAS/Shunts: The atrial septum is grossly normal.  LEFT VENTRICLE PLAX 2D LVIDd:         4.70 cm      Diastology LVIDs:         4.10 cm      LV e' medial:    5.33 cm/s LV PW:         1.10 cm      LV E/e' medial:  22.1 LV IVS:        1.10 cm      LV e' lateral:   6.64 cm/s LVOT diam:     1.90 cm      LV E/e' lateral: 17.8 LV SV:         35 LV SV Index:   21 LVOT Area:     2.84 cm  LV Volumes (MOD) LV vol d, MOD  A2C: 173.0 ml LV vol d, MOD A4C: 127.0 ml LV vol s, MOD A2C: 131.0 ml LV vol s, MOD A4C: 89.6 ml LV SV MOD A2C:     42.0 ml LV SV MOD A4C:     127.0 ml LV SV MOD BP:      42.7 ml RIGHT VENTRICLE RV Basal diam:  3.80 cm RV Mid diam:    3.40 cm RV S prime:     7.94 cm/s TAPSE (M-mode): 1.6 cm LEFT ATRIUM             Index        RIGHT ATRIUM           Index LA diam:        3.30 cm 1.98 cm/m   RA Area:     12.80 cm LA Vol (A2C):   56.2 ml 33.74 ml/m  RA Volume:   29.30 ml  17.59 ml/m LA Vol (A4C):   70.9 ml 42.56 ml/m LA Biplane Vol: 66.2 ml 39.74 ml/m  AORTIC VALVE                     PULMONIC VALVE AV Area (Vmax):    0.91 cm      PV Vmax:       1.28 m/s AV Area (Vmean):   0.91 cm      PV Peak grad:  6.6 mmHg AV Area (VTI):  0.92 cm AV Vmax:           194.00 cm/s AV Vmean:          137.500 cm/s AV VTI:            0.381 m AV Peak Grad:      15.1 mmHg AV Mean Grad:      9.0 mmHg LVOT Vmax:         62.10 cm/s LVOT Vmean:        43.900 cm/s LVOT VTI:          0.123 m LVOT/AV VTI ratio: 0.32  AORTA Ao Root diam: 3.10 cm Ao Asc diam:  2.90 cm MITRAL VALVE                  TRICUSPID VALVE MV Area (PHT): 4.63 cm       TR Peak grad:   33.4 mmHg MV Area VTI:   1.11 cm       TR Vmax:        289.00 cm/s MV Peak grad:  6.2 mmHg MV Mean grad:  2.0 mmHg       SHUNTS MV Vmax:       1.25 m/s       Systemic VTI:  0.12 m MV Vmean:      60.4 cm/s      Systemic Diam: 1.90 cm MV Decel Time: 164 msec MR Peak grad:    73.4 mmHg MR Mean grad:    47.0 mmHg MR Vmax:         428.50 cm/s MR Vmean:        329.0 cm/s MR PISA:         3.08 cm MR PISA Eff ROA: 20 mm MR PISA Radius:  0.70 cm MV E velocity: 118.00 cm/s MV A velocity: 82.50 cm/s MV E/A ratio:  1.43 Gwyndolyn Kaufman MD Electronically signed by Gwyndolyn Kaufman MD Signature Date/Time: 06/01/2022/5:19:45 PM    Final    CT Angio Chest PE W and/or Wo Contrast  Result Date: 05/31/2022 CLINICAL DATA:  COPD, short of breath for 2 days EXAM: CT ANGIOGRAPHY CHEST WITH  CONTRAST TECHNIQUE: Multidetector CT imaging of the chest was performed using the standard protocol during bolus administration of intravenous contrast. Multiplanar CT image reconstructions and MIPs were obtained to evaluate the vascular anatomy. RADIATION DOSE REDUCTION: This exam was performed according to the departmental dose-optimization program which includes automated exposure control, adjustment of the mA and/or kV according to patient size and/or use of iterative reconstruction technique. CONTRAST:  38mL OMNIPAQUE IOHEXOL 350 MG/ML SOLN COMPARISON:  11/04/2009, 05/31/2022 FINDINGS: Cardiovascular: This is a technically adequate evaluation of the pulmonary vasculature. No filling defects or pulmonary emboli. The heart is enlarged. Dilated main pulmonary artery measuring up to 3.4 cm may reflect pulmonary arterial hypertension. There is prominent left ventricular dilatation. Calcification of the mitral and aortic valves. Extensive atherosclerosis throughout the coronary vasculature. No pericardial effusion. Normal caliber of the thoracic aorta. Diffuse aortic atherosclerosis. Evaluation of the aortic lumen is limited due to timing of contrast bolus. Mediastinum/Nodes: No enlarged mediastinal, hilar, or axillary lymph nodes. Thyroid gland, trachea, and esophagus demonstrate no significant findings. Lungs/Pleura: Moderate bilateral free-flowing pleural effusions, less than 1 L each. Dependent consolidation within the bilateral lower lobes, left greater than right, favor atelectasis. No pneumothorax. Central airways are patent. Upper Abdomen: No acute abnormality.  Small hiatal hernia. Musculoskeletal: No acute or destructive bony lesions. Reconstructed images demonstrate no additional findings. Review of  the MIP images confirms the above findings. IMPRESSION: 1. No evidence of pulmonary embolus. 2. Cardiomegaly.  Prominent left ventricular dilatation. 3. Dilated main pulmonary artery consistent with pulmonary  arterial hypertension. 4. Moderate bilateral free-flowing pleural effusions. Dependent lower lobe consolidation favor compressive atelectasis. 5. Aortic Atherosclerosis (ICD10-I70.0). Coronary artery atherosclerosis. Electronically Signed   By: Randa Ngo M.D.   On: 05/31/2022 16:46   DG Chest Port 1 View  Result Date: 05/31/2022 CLINICAL DATA:  Dyspnea. EXAM: PORTABLE CHEST 1 VIEW COMPARISON:  Radiographs 11/25/2021 and 05/29/2020.  CT 11/04/2009. FINDINGS: 1049 hours. The heart is enlarged and there is aortic atherosclerosis. The pulmonary vasculature is ill-defined, suspicious for mild edema. There may be small bilateral pleural effusions. No confluent airspace opacity or pneumothorax. No acute osseous findings are evident. Telemetry leads overlie the chest. IMPRESSION: Cardiomegaly with probable mild pulmonary edema and small bilateral pleural effusions. Findings suggest mild congestive heart failure. Electronically Signed   By: Richardean Sale M.D.   On: 05/31/2022 10:56       LOS: 2 days   [facility] Pager on [email or web address]  06/02/2022, 9:23 AM

## 2022-06-03 ENCOUNTER — Inpatient Hospital Stay (HOSPITAL_COMMUNITY): Payer: PPO

## 2022-06-03 DIAGNOSIS — I501 Left ventricular failure: Secondary | ICD-10-CM | POA: Diagnosis not present

## 2022-06-03 DIAGNOSIS — I5043 Acute on chronic combined systolic (congestive) and diastolic (congestive) heart failure: Secondary | ICD-10-CM | POA: Diagnosis not present

## 2022-06-03 DIAGNOSIS — I25118 Atherosclerotic heart disease of native coronary artery with other forms of angina pectoris: Secondary | ICD-10-CM | POA: Diagnosis not present

## 2022-06-03 DIAGNOSIS — N179 Acute kidney failure, unspecified: Secondary | ICD-10-CM | POA: Diagnosis not present

## 2022-06-03 DIAGNOSIS — I25110 Atherosclerotic heart disease of native coronary artery with unstable angina pectoris: Secondary | ICD-10-CM | POA: Diagnosis not present

## 2022-06-03 LAB — BASIC METABOLIC PANEL
Anion gap: 8 (ref 5–15)
BUN: 35 mg/dL — ABNORMAL HIGH (ref 8–23)
CO2: 21 mmol/L — ABNORMAL LOW (ref 22–32)
Calcium: 8.8 mg/dL — ABNORMAL LOW (ref 8.9–10.3)
Chloride: 110 mmol/L (ref 98–111)
Creatinine, Ser: 1.57 mg/dL — ABNORMAL HIGH (ref 0.44–1.00)
GFR, Estimated: 32 mL/min — ABNORMAL LOW (ref >60)
Glucose, Bld: 127 mg/dL — ABNORMAL HIGH (ref 70–99)
Potassium: 4.3 mmol/L (ref 3.5–5.1)
Sodium: 139 mmol/L (ref 135–145)

## 2022-06-03 MED ORDER — POLYETHYLENE GLYCOL 3350 17 G PO PACK
17.0000 g | PACK | Freq: Every day | ORAL | Status: DC
  Administered 2022-06-03 – 2022-06-05 (×3): 17 g via ORAL
  Filled 2022-06-03 (×3): qty 1

## 2022-06-03 MED ORDER — FUROSEMIDE 10 MG/ML IJ SOLN
40.0000 mg | Freq: Every day | INTRAMUSCULAR | Status: DC
  Administered 2022-06-03: 40 mg via INTRAVENOUS
  Filled 2022-06-03 (×2): qty 4

## 2022-06-03 MED ORDER — OXYCODONE HCL 5 MG PO TABS
5.0000 mg | ORAL_TABLET | Freq: Once | ORAL | Status: AC
  Administered 2022-06-03: 5 mg via ORAL
  Filled 2022-06-03: qty 1

## 2022-06-03 MED ORDER — BOOST / RESOURCE BREEZE PO LIQD CUSTOM
1.0000 | Freq: Three times a day (TID) | ORAL | Status: DC
  Administered 2022-06-03 – 2022-06-05 (×4): 1 via ORAL

## 2022-06-03 MED ORDER — ADULT MULTIVITAMIN W/MINERALS CH
1.0000 | ORAL_TABLET | Freq: Every day | ORAL | Status: DC
  Administered 2022-06-03 – 2022-06-05 (×3): 1 via ORAL
  Filled 2022-06-03 (×3): qty 1

## 2022-06-03 NOTE — Evaluation (Signed)
Physical Therapy Evaluation Patient Details Name: [name] [name] MRN: [id number] DOB: 05/31/1936 Today's Date: 06/03/2022  History of Present Illness  86 y.o. female with medical history significant of aortic stenosis, osteoarthritis, CAD, ACE inhibitor induced cough, stage 3b CKD, depression, GERD, hepatitis B, hepatitis C, PACs, PVCs, hyperlipidemia, hypertensive heart disease, IBS, obesity, osteoarthritis of the knees with a history of TKR, osteoporosis, pancreatitis, pleural effusion, peptic ulcer disease, sleep apnea not on CPAP who was recently admitted and discharged by cardiology due to angina, undergoing cardiac catheterization which showed severe CAD ,was not amenable to stent placement and the patient is not a candidate for CABG. She presented 05/31/22 with complaints of shortness of breath and there was was concern for pulmonary edema  Clinical Impression  The patient is noted up ad lib in room. Patient was ready to ambulate and reports a  moment of dizziness.  BP 115/83 spo2 98% ra AND 87 hr.  Patient ambulated s 120' used Rw but was  not really needed. No further reports of dizziness. SPO2 98% on RA, HR 85% after ambulation. No further acute PT needs. PT signing off. Patient can continue ambulation with other staff and family.  Patient independent at baseline and drives, uses no AD.      Recommendations for follow up therapy are one component of a multi-disciplinary discharge planning process, led by the attending physician.  Recommendations may be updated based on patient status, additional functional criteria and insurance authorization.  Follow Up Recommendations No PT follow up      Assistance Recommended at Discharge PRN  Patient can return home with the following  Help with stairs or ramp for entrance;Assistance with cooking/housework;Assist for transportation    Equipment Recommendations None recommended by PT  Recommendations for Other Services        Functional Status Assessment Patient has not had a recent decline in their functional status     Precautions / Restrictions Precautions Precaution Comments: lightheaded to stand up      Mobility  Bed Mobility Overal bed mobility: Independent                  Transfers Overall transfer level: Independent                      Ambulation/Gait Ambulation/Gait assistance: Independent Gait Distance (Feet): 130 Feet Assistive device: Rolling walker (2 wheels) Gait Pattern/deviations: Step-through pattern Gait velocity: wfl     General Gait Details: up ad lib in room to BR with no device  Stairs            Wheelchair Mobility    Modified Rankin (Stroke Patients Only)       Balance Overall balance assessment: Mild deficits observed, not formally tested                                           Pertinent Vitals/Pain Pain Assessment Pain Assessment: No/denies pain    Home Living Family/patient expects to be discharged to:: Private residence Living Arrangements: Spouse/significant other;Children Available Help at Discharge: Family;Available 24 hours/day Type of Home: House Home Access: Stairs to enter Entrance Stairs-Rails: Doctor, general practice of Steps: 3   Home Layout: One level Home Equipment: None      Prior Function Prior Level of Function : Independent/Modified Independent             Mobility  Comments: drives ADLs Comments: IADLs     Hand Dominance   Dominant Hand: Right    Extremity/Trunk Assessment        Lower Extremity Assessment Lower Extremity Assessment: Overall WFL for tasks assessed    Cervical / Trunk Assessment Cervical / Trunk Assessment: Normal  Communication   Communication: No difficulties  Cognition Arousal/Alertness: Awake/alert Behavior During Therapy: WFL for tasks assessed/performed Overall Cognitive Status: Within Functional Limits for tasks assessed                                           General Comments      Exercises     Assessment/Plan    PT Assessment Patient does not need any further PT services  PT Problem List Decreased mobility       PT Treatment Interventions      PT Goals (Current goals can be found in the Care Plan section)  Acute Rehab PT Goals Patient Stated Goal: go home today PT Goal Formulation: All assessment and education complete, DC therapy    Frequency       Co-evaluation               AM-PAC PT "6 Clicks" Mobility  Outcome Measure Help needed turning from your back to your side while in a flat bed without using bedrails?: None Help needed moving from lying on your back to sitting on the side of a flat bed without using bedrails?: None Help needed moving to and from a bed to a chair (including a wheelchair)?: None Help needed standing up from a chair using your arms (e.g., wheelchair or bedside chair)?: None Help needed to walk in hospital room?: None Help needed climbing 3-5 steps with a railing? : A Little 6 Click Score: 23    End of Session Equipment Utilized During Treatment: Gait belt Activity Tolerance: Patient tolerated treatment well Patient left: in bed;with family/visitor present Nurse Communication: Mobility status PT Visit Diagnosis: Unsteadiness on feet (R26.81);Difficulty in walking, not elsewhere classified (R26.2)    Time: 1024-1040 PT Time Calculation (min) (ACUTE ONLY): 16 min   Charges:   PT Evaluation $PT Eval Low Complexity: 1 Low          [place] Office [phone] Weekend [id number]   Claretha Cooper 06/03/2022, 10:48 AM

## 2022-06-03 NOTE — Progress Notes (Signed)
Initial Nutrition Assessment  DOCUMENTATION CODES:   Non-severe (moderate) malnutrition in context of chronic illness  INTERVENTION:  Liberalize diet from a heart healthy to a regular diet to provide widest variety of menu options to enhance nutritional adequacy Boost Breeze po TID, each supplement provides 250 kcal and 9 grams of protein Magic cup TID with meals, each supplement provides 290 kcal and 9 grams of protein MVI with minerals daily  NUTRITION DIAGNOSIS:   Moderate Malnutrition related to chronic illness (CAD, AS) as evidenced by mild fat depletion, mild muscle depletion.  GOAL:   Patient will meet greater than or equal to 90% of their needs   MONITOR:   PO intake, Supplement acceptance, Labs, Weight trends  REASON FOR ASSESSMENT:   Malnutrition Screening Tool    ASSESSMENT:   Pt admitted with SOB r/t pulmonary edema. PMH significant for AS, osteoarthritis, CAD, stage 3B CKD, depression, GERD, hepatitis B, hepatitis C, PACs, PCVs, HLD, hypertensive heart disease, IBS, pancreatitis, pleural effusion, PUD.   Pt sitting in chair with her nieces at bedside. She reports having a poor appetite since her admission in November which she related to medications given at that time. Since this time, she has only tolerated a couple bites of food at meal times. Observed lunch tray on table with a couple bites of each item taken. She states that it is easier for her to drink beverages than it is to eat. At home she had been trying to drink Boost nutrition supplements but they did not settle well on her stomach. She is agreeable to trying Boost Breeze and Magic cup.   Meal completions: 12/28: 100% breakfast  Pt reports that she used to weigh 178 lbs and is now down to 143 lbs. Pt's weight in 2022-07-05 was recorded as 78 kg. Her current weight is 70.1 kg. This is a weight loss of 10.1% which is concerning but not clinically significant for time frame.   Medications: pepcid, lasix  40mg  daily, protonix, miralax   Labs: BUN 35, Cr 1.57, GFR 32  NUTRITION - FOCUSED PHYSICAL EXAM:  Flowsheet Row Most Recent Value  Orbital Region Mild depletion  Upper Arm Region Mild depletion  Thoracic and Lumbar Region No depletion  Buccal Region Moderate depletion  Temple Region Moderate depletion  Clavicle Bone Region No depletion  Clavicle and Acromion Bone Region No depletion  Scapular Bone Region No depletion  Dorsal Hand Mild depletion  Patellar Region Mild depletion  Anterior Thigh Region Mild depletion  Posterior Calf Region No depletion  Edema (RD Assessment) None  Hair Reviewed  Eyes Reviewed  Mouth Reviewed  Skin Reviewed  Nails Reviewed       Diet Order:   Diet Order             Diet regular Room service appropriate? Yes; Fluid consistency: Thin; Fluid restriction: 1200 mL Fluid  Diet effective now                   EDUCATION NEEDS:   Education needs have been addressed  Skin:  Skin Assessment: Reviewed RN Assessment  Last BM:  12/24  Height:   Ht Readings from Last 1 Encounters:  06/01/22 5' (1.524 m)    Weight:   Wt Readings from Last 1 Encounters:  06/02/22 70.1 kg   BMI:  Body mass index is 30.18 kg/m.  Estimated Nutritional Needs:   Kcal:  1300-1500  Protein:  65-80g  Fluid:  1.3-1.5L  06/04/22, RDN, LDN Clinical Nutrition

## 2022-06-03 NOTE — Evaluation (Signed)
Occupational Therapy Evaluation Patient Details Name: [name] [name] MRN: [id number] DOB: 01/10/1936 Today's Date: 06/03/2022   History of Present Illness 86 y.o. female with medical history significant of aortic stenosis, osteoarthritis, CAD, ACE inhibitor induced cough, stage 3b CKD, depression, GERD, hepatitis B, hepatitis C, PACs, PVCs, hyperlipidemia, hypertensive heart disease, IBS, obesity, osteoarthritis of the knees with a history of TKR, osteoporosis, pancreatitis, pleural effusion, peptic ulcer disease, sleep apnea not on CPAP who was recently admitted and discharged by cardiology due to angina, undergoing cardiac catheterization which showed severe CAD ,was not amenable to stent placement and the patient is not a candidate for CABG. She presented 05/31/22 with complaints of shortness of breath and there was was concern for pulmonary edema   Clinical Impression   [name] [name] performed all assessed tasks independently, including lower body dressing, sit to stand, and hand washing standing at the sink. She appears to be at her baseline level of functioning for self-care management & she is not presenting with functional deficits that warrant the need for further OT services. OT will sign off and recommend she return home at discharge.       Recommendations for follow up therapy are one component of a multi-disciplinary discharge planning process, led by the attending physician.  Recommendations may be updated based on patient status, additional functional criteria and insurance authorization.   Follow Up Recommendations  No OT follow up     Assistance Recommended at Discharge None     Functional Status Assessment  Patient has not had a recent decline in their functional status  Equipment Recommendations  None recommended by OT       Precautions / Restrictions Precautions Precaution Comments: lightheaded to stand up Restrictions Weight Bearing Restrictions: No       Mobility     Transfers Overall transfer level: Independent         Balance Overall balance assessment: No apparent balance deficits (not formally assessed)        ADL either performed or assessed with clinical judgement   ADL Overall ADL's : At baseline             Pertinent Vitals/Pain Pain Assessment Pain Assessment: No/denies pain     Hand Dominance Right   Extremity/Trunk Assessment Upper Extremity Assessment Upper Extremity Assessment: Overall WFL for tasks assessed   Lower Extremity Assessment Lower Extremity Assessment: Overall WFL for tasks assessed      Communication Communication Communication: No difficulties   Cognition Arousal/Alertness: Awake/alert Behavior During Therapy: WFL for tasks assessed/performed Overall Cognitive Status: Within Functional Limits for tasks assessed        General Comments: Oriented x4, able to follow commands     General Comments       Exercises     Shoulder Instructions      Home Living Family/patient expects to be discharged to:: Private residence Living Arrangements: Spouse/significant other;Daughter and son-in-law Available Help at Discharge: Family Type of Home: House Home Access: Stairs to enter Secretary/administrator of Steps: 5 Home Layout: One level     Bathroom Shower/Tub: Dietitian: None          Prior Functioning/Environment Prior Level of Function : Independent/Modified Independent             Mobility Comments:  (Independent with ambulation.) ADLs Comments:  (Independent with ADLs, driving, cooking, and cleaning.)             OT  Treatment/Interventions:   No OT treatment needs identified       OT Frequency:  N/A       AM-PAC OT "6 Clicks" Daily Activity     Outcome Measure Help from another person eating meals?: None Help from another person taking care of personal grooming?: None Help from another person toileting, which includes  using toliet, bedpan, or urinal?: None Help from another person bathing (including washing, rinsing, drying)?: None Help from another person to put on and taking off regular upper body clothing?: None Help from another person to put on and taking off regular lower body clothing?: None 6 Click Score: 24   End of Session Nurse Communication: Mobility status  Activity Tolerance: Patient tolerated treatment well Patient left: in chair;with call bell/phone within reach  OT Visit Diagnosis: Muscle weakness (generalized) (M62.81)                Time: [id number] OT Time Calculation (min): 17 min Charges:  OT General Charges $OT Visit: 1 Visit OT Evaluation $OT Eval Low Complexity: 1 Low    Cashe Gatt L Lucca Ballo, OTR/L 06/03/2022, 1:56 PM

## 2022-06-03 NOTE — Progress Notes (Signed)
Rounding Note    Patient Name: [name] [name] Date of Encounter: 06/03/2022  [place] Cardiologist: Larae Grooms, MD   Subjective   Pt has not complaints, slept well last night.   Has some rales in her R lung base   Inpatient Medications    Scheduled Meds:  Chlorhexidine Gluconate Cloth  6 each Topical Daily   clopidogrel  75 mg Oral Daily   famotidine  20 mg Oral Daily   isosorbide mononitrate  60 mg Oral Daily   metoprolol tartrate  12.5 mg Oral BID   mometasone-formoterol  2 puff Inhalation BID   pantoprazole  40 mg Oral BID   rosuvastatin  5 mg Oral Daily   Continuous Infusions:   PRN Meds: acetaminophen **OR** acetaminophen, albuterol, alum & mag hydroxide-simeth, ondansetron **OR** ondansetron (ZOFRAN) IV, traZODone   Vital Signs    Vitals:   06/02/22 1754 06/02/22 2157 06/03/22 0228 06/03/22 0635  BP: (!) 127/90 (!) 134/97 109/78 109/80  Pulse: 82 77 73 79  Resp: 20  18 18   Temp: 97.9 F (36.6 C)  97.9 F (36.6 C) 97.9 F (36.6 C)  TempSrc: Oral  Oral Oral  SpO2: 96%  100% 97%  Weight:      Height:        Intake/Output Summary (Last 24 hours) at 06/03/2022 0825 Last data filed at 06/02/2022 1000 Gross per 24 hour  Intake 298.73 ml  Output 200 ml  Net 98.73 ml       06/02/2022    7:00 AM 06/01/2022    6:00 PM 06/01/2022    7:58 AM  Last 3 Weights  Weight (lbs) 154 lb 8.7 oz 155 lb 13.8 oz 153 lb  Weight (kg) 70.1 kg 70.7 kg 69.4 kg      Telemetry    Sinus to sinus bradycardia HR 40-60s, ventricular bigeminy - Personally Reviewed  ECG    No new tracings - Personally Reviewed  Physical Exam   Physical Exam: Blood pressure 109/80, pulse 79, temperature 97.9 F (36.6 C), temperature source Oral, resp. rate 18, height 5' (1.524 m), weight 70.1 kg, SpO2 97 %.       GEN:  elderly female ,  in no acute distress HEENT: Normal NECK: No JVD; No carotid bruits LYMPHATICS: No lymphadenopathy CARDIAC: RRR ,  no murmurs, rubs, gallops RESPIRATORY:  few rales in R lung base  ABDOMEN: Soft, non-tender, non-distended MUSCULOSKELETAL:  No edema; No deformity  SKIN: Warm and dry NEUROLOGIC:  Alert and oriented x 3  Labs    High Sensitivity Troponin:   Recent Labs  Lab 05/31/22 1043 05/31/22 1230 05/31/22 2141 06/01/22 0553  TROPONINIHS 190* 226* 2,696* 1,995*      Chemistry Recent Labs  Lab 05/31/22 1043 05/31/22 2149 06/01/22 1027 06/02/22 0329 06/03/22 0711  NA 145  --  142 143 139  K 3.0*  --  4.4 4.9 4.3  CL 117*  --  112* 112* 110  CO2 18*  --  22 24 21*  GLUCOSE 141*  --  124* 96 127*  BUN 25*  --  38* 35* 35*  CREATININE 1.33*  --  1.70* 1.91* 1.57*  CALCIUM 8.1*  --  8.9 8.8* 8.8*  MG  --  2.6*  --   --   --   PROT 6.3*  --   --   --   --   ALBUMIN 3.3*  --   --   --   --  AST 16  --   --   --   --   ALT 14  --   --   --   --   ALKPHOS 63  --   --   --   --   BILITOT 0.9  --   --   --   --   GFRNONAA 39*  --  29* 26* 32*  ANIONGAP 10  --  8 7 8      Lipids No results for input(s): "CHOL", "TRIG", "HDL", "LABVLDL", "LDLCALC", "CHOLHDL" in the last 168 hours.  Hematology Recent Labs  Lab 05/31/22 1043 06/01/22 1027 06/02/22 0329  WBC 6.7 11.4* 9.9  RBC 3.65* 3.35* 3.37*  HGB 10.7* 10.0* 9.8*  HCT 33.7* 31.1* 32.1*  MCV 92.3 92.8 95.3  MCH 29.3 29.9 29.1  MCHC 31.8 32.2 30.5  RDW 14.8 15.0 15.2  PLT 181 176 167    Thyroid No results for input(s): "TSH", "FREET4" in the last 168 hours.  BNP Recent Labs  Lab 05/31/22 1034  BNP 4,012.1*     DDimer  Recent Labs  Lab 05/31/22 1043  DDIMER 1.82*      Radiology    DG CHEST PORT 1 VIEW  Result Date: 06/03/2022 CLINICAL DATA:  Pulmonary edema EXAM: PORTABLE CHEST 1 VIEW COMPARISON:  Portable exam 0437 hours compared to 05/31/2022 FINDINGS: Enlargement of cardiac silhouette with pulmonary vascular congestion. Atherosclerotic calcification aorta. Diffuse BILATERAL pulmonary infiltrates consistent  with pulmonary edema and CHF, slightly increased from previous exam. No definite pleural effusion or pneumothorax. Bones demineralized. IMPRESSION: Increased CHF versus previous study. Aortic Atherosclerosis (ICD10-I70.0). Electronically Signed   By: 06/02/2022 M.D.   On: 06/03/2022 07:43   ECHOCARDIOGRAM COMPLETE  Result Date: 06/01/2022    ECHOCARDIOGRAM REPORT   Patient Name:   [name] [name] Date of Exam: 06/01/2022 Medical Rec #:  06/03/2022             Height:       60.0 in Accession #:    [id number]            Weight:       153.0 lb Date of Birth:  [birth year]              BSA:          1.666 m Patient Age:    86 years              BP:           111/76 mmHg Patient Gender: F                     HR:           76 bpm. Exam Location:  Inpatient Procedure: 2D Echo and Intracardiac Opacification Agent Indications:    CHF  History:        Patient has prior history of Echocardiogram examinations, most                 recent 11/26/2021. CAD; Risk Factors:Hypertension and                 Dyslipidemia.  Sonographer:    11/28/2021 Referring Phys: [phone] [facility]  Sonographer Comments: Technically difficult study due to poor echo windows. Image acquisition challenging due to respiratory motion. IMPRESSIONS  1. Left ventricular ejection fraction, by estimation, is 25 to 30%. The left ventricle has severely decreased function. There is global hypokinesis with akinesis of the inferior and  all septal LV segments concerning for multivessel CAD. Left ventricular  diastolic parameters are consistent with Grade II diastolic dysfunction (pseudonormalization). Elevated left atrial pressure.  2. Right ventricular systolic function is moderately reduced. The right ventricular size is not well visualized. There is mildly elevated pulmonary artery systolic pressure.  3. Left atrial size was moderately dilated.  4. The mitral valve is degenerative. There is tethering of the posterior mitral valve leaflet with resultant  moderate mitral valve regurgitation. Moderate mitral annular calcification.  5. Moderate-to-severe low flow, low gradient aortic stenosis with mean graident 64mmHg, Vmax 72m/s, AVA 1.0, DI 0.29. The aortic valve is calcified. There is severe calcifcation of the aortic valve. There is severe thickening of the aortic valve. Aortic valve regurgitation is not visualized. Moderate to severe aortic valve stenosis.  6. The inferior vena cava is dilated in size with <50% respiratory variability, suggesting right atrial pressure of 15 mmHg. Comparison(s): Compared to prior TTE on 11/2021, the LVEF has dropped significantly from 65-70% to 25-30% with wall motion abnormalities as described above. FINDINGS  Left Ventricle: There is global hypokinesis with akinesis of the inferior and all septal LV segments concerning for multivessel CAD. Left ventricular ejection fraction, by estimation, is 25 to 30%. The left ventricle has severely decreased function. Definity contrast agent was given IV to delineate the left ventricular endocardial borders. The left ventricular internal cavity size was mildly dilated. There is no left ventricular hypertrophy. Left ventricular diastolic parameters are consistent with Grade II diastolic dysfunction (pseudonormalization). Elevated left atrial pressure. Right Ventricle: The right ventricular size is not well visualized. Right vetricular wall thickness was not well visualized. Right ventricular systolic function is moderately reduced. There is mildly elevated pulmonary artery systolic pressure. The tricuspid regurgitant velocity is 2.89 m/s, and with an assumed right atrial pressure of 3 mmHg, the estimated right ventricular systolic pressure is [id number] mmHg. Left Atrium: Left atrial size was moderately dilated. Right Atrium: Right atrial size was normal in size. Pericardium: There is no evidence of pericardial effusion. Mitral Valve: The mitral valve is degenerative in appearance. There is moderate  thickening of the mitral valve leaflet(s). There is mild calcification of the mitral valve leaflet(s). Moderate mitral annular calcification. There is tethering of the posterior mitral valve leaflet with resultant moderate mitral valve regurgitation. MV peak gradient, 6.2 mmHg. The mean mitral valve gradient is 2.0 mmHg. Tricuspid Valve: The tricuspid valve is grossly normal. Tricuspid valve regurgitation is trivial. Aortic Valve: Moderate-to-severe low flow, low gradient aortic stenosis with mean graident 90mmHg, Vmax 41m/s, AVA 1.0, DI 0.29. The aortic valve is calcified. There is severe calcifcation of the aortic valve. There is severe thickening of the aortic valve. Aortic valve regurgitation is not visualized. Moderate to severe aortic stenosis is present. Aortic valve mean gradient measures 9.0 mmHg. Aortic valve peak gradient measures 15.1 mmHg. Aortic valve area, by VTI measures 0.92 cm. Pulmonic Valve: The pulmonic valve was not well visualized. Pulmonic valve regurgitation is trivial. Aorta: The aortic root is normal in size and structure. Venous: The inferior vena cava is dilated in size with less than 50% respiratory variability, suggesting right atrial pressure of 15 mmHg. IAS/Shunts: The atrial septum is grossly normal.  LEFT VENTRICLE PLAX 2D LVIDd:         4.70 cm      Diastology LVIDs:         4.10 cm      LV e' medial:    5.33 cm/s LV PW:  1.10 cm      LV E/e' medial:  22.1 LV IVS:        1.10 cm      LV e' lateral:   6.64 cm/s LVOT diam:     1.90 cm      LV E/e' lateral: 17.8 LV SV:         35 LV SV Index:   21 LVOT Area:     2.84 cm  LV Volumes (MOD) LV vol d, MOD A2C: 173.0 ml LV vol d, MOD A4C: 127.0 ml LV vol s, MOD A2C: 131.0 ml LV vol s, MOD A4C: 89.6 ml LV SV MOD A2C:     42.0 ml LV SV MOD A4C:     127.0 ml LV SV MOD BP:      42.7 ml RIGHT VENTRICLE RV Basal diam:  3.80 cm RV Mid diam:    3.40 cm RV S prime:     7.94 cm/s TAPSE (M-mode): 1.6 cm LEFT ATRIUM             Index         RIGHT ATRIUM           Index LA diam:        3.30 cm 1.98 cm/m   RA Area:     12.80 cm LA Vol (A2C):   56.2 ml 33.74 ml/m  RA Volume:   29.30 ml  17.59 ml/m LA Vol (A4C):   70.9 ml 42.56 ml/m LA Biplane Vol: 66.2 ml 39.74 ml/m  AORTIC VALVE                     PULMONIC VALVE AV Area (Vmax):    0.91 cm      PV Vmax:       1.28 m/s AV Area (Vmean):   0.91 cm      PV Peak grad:  6.6 mmHg AV Area (VTI):     0.92 cm AV Vmax:           194.00 cm/s AV Vmean:          137.500 cm/s AV VTI:            0.381 m AV Peak Grad:      15.1 mmHg AV Mean Grad:      9.0 mmHg LVOT Vmax:         62.10 cm/s LVOT Vmean:        43.900 cm/s LVOT VTI:          0.123 m LVOT/AV VTI ratio: 0.32  AORTA Ao Root diam: 3.10 cm Ao Asc diam:  2.90 cm MITRAL VALVE                  TRICUSPID VALVE MV Area (PHT): 4.63 cm       TR Peak grad:   33.4 mmHg MV Area VTI:   1.11 cm       TR Vmax:        289.00 cm/s MV Peak grad:  6.2 mmHg MV Mean grad:  2.0 mmHg       SHUNTS MV Vmax:       1.25 m/s       Systemic VTI:  0.12 m MV Vmean:      60.4 cm/s      Systemic Diam: 1.90 cm MV Decel Time: 164 msec MR Peak grad:    73.4 mmHg MR Mean grad:    47.0 mmHg MR Vmax:  428.50 cm/s MR Vmean:        329.0 cm/s MR PISA:         3.08 cm MR PISA Eff ROA: 20 mm MR PISA Radius:  0.70 cm MV E velocity: 118.00 cm/s MV A velocity: 82.50 cm/s MV E/A ratio:  1.43 Gwyndolyn Kaufman MD Electronically signed by Gwyndolyn Kaufman MD Signature Date/Time: 06/01/2022/5:19:45 PM    Final     Cardiac Studies   Echo 06/01/22: 1. Left ventricular ejection fraction, by estimation, is 25 to 30%. The  left ventricle has severely decreased function. There is global  hypokinesis with akinesis of the inferior and all septal LV segments  concerning for multivessel CAD. Left ventricular   diastolic parameters are consistent with Grade II diastolic dysfunction  (pseudonormalization). Elevated left atrial pressure.   2. Right ventricular systolic function is  moderately reduced. The right  ventricular size is not well visualized. There is mildly elevated  pulmonary artery systolic pressure.   3. Left atrial size was moderately dilated.   4. The mitral valve is degenerative. There is tethering of the posterior  mitral valve leaflet with resultant moderate mitral valve regurgitation.  Moderate mitral annular calcification.   5. Moderate-to-severe low flow, low gradient aortic stenosis with mean  graident 44mmHg, Vmax 45m/s, AVA 1.0, DI 0.29. The aortic valve is  calcified. There is severe calcifcation of the aortic valve. There is  severe thickening of the aortic valve. Aortic  valve regurgitation is not visualized. Moderate to severe aortic valve  stenosis.   6. The inferior vena cava is dilated in size with <50% respiratory  variability, suggesting right atrial pressure of 15 mmHg.    Heart cath 04/26/22:   Mid LM to Dist LM lesion is 90% stenosed.   Ost Cx to Prox Cx lesion is 75% stenosed.   1st Mrg lesion is 70% stenosed.   Prox LAD to Mid LAD lesion is 90% stenosed.   Dist LAD lesion is 100% stenosed.   Mid RCA lesion is 100% stenosed.   LV end diastolic pressure is normal.  LVEDP 15 mmHg.   There is no aortic valve stenosis.   Severe, near continuous belching during the case while lying flat.   Aortic saturation 88%, PA saturation 67%, PA pressure 25/11, mean PA pressure 18 mmHg, mean pulmonary capillary wedge pressure 17 mmHg, cardiac output 6.72 L/min, cardiac index 3.95.  Mean right atrial pressure 5 mmHg.   Essentially normal right heart pressures after recent diuresis due to elevated BNP.   Severe, three-vessel coronary artery disease.  Significant progression of her CAD compared to her 2009 catheterization.   I do not think she would be a surgical candidate due to overall frailty and age.  No good PCI options.  Results conveyed by phone to the daughter.   Of note, she was having significant reflux, belching during the procedure  while lying flat.  She was treated with Zofran.  In talking to her daughter, this is her biggest complaint.  She cannot sleep at night.  She has to sit up to avoid these symptoms.   Her next issue is when she tries to exert, she has chest discomfort.  This is more cardiac pain.  Will discuss with interventional colleagues but I do not see any targets for PCI given the diffuse nature of her severe CAD.  Would consider palliative care.   Will watch overnight in the hospital to help with medicine titration and possible palliative care consult.  Patient Profile  86 y.o. female  with a hx of extensive coronary artery disease without good interventional targets, moderate AS and mild-moderate MR by echo 11/2021 (but no AS by cath 04/2022), chronic HFpEF, HTN, PUD, HBV, HCV, GERD, PACs, PVCs, chronic cough, HLD (intolerant of statins), pancreatitis, obesity, RA, normocytic anemia, CKD stage 3b who is being seen 05/31/2022 for the evaluation of chest pain/elevated troponin at the request of Dr. Tomi Bamberger.   Assessment & Plan    CAD NSTEMI - Severe left main and 3v disease without targets for CABG or PCI - Suspect NSTEMI due to severe disease and acute respiratory failure requiring BiPAP - NSTEMI treated with IV nitro and heparin  Has completed 48 hours of IV heparin and NTG   Continue plavix, imdur , beta blocker  She is not having any chest pain currently    Acute systolic heart failure Chronic diastolic heart failure Mild to moderate MR Hypertension - echo yesterday with LVEF 25-30% - down from normal in June - LVEDP was 15 mmHg at the time of hear heart cath last month - question if this is ischemic vs possibly PVC-mediated cardiomyopathy given her frequency of bigeminy on telemetry - BNP 4012 at admission - she slept flat last night and does not appear volume up today after targeted doses of diuresis - GDMT: BB, imdur  CXR is worse,  has some persistent rales in R base Will give lasix 40  mg IV today and follow    Ventricular bigeminy Has largely resolved.    Hyperlipidemia intolerant to statins - on 5 mg crestor   A on CKD stage 3b Continue to follow       Mertie Moores, MD  06/03/2022 8:39 AM    [address],  [place], [place]  [address] Phone: [phone]; Fax: [phone]

## 2022-06-03 NOTE — Care Management Important Message (Signed)
Important Message  Patient Details IM Letter given. Name: [name] [name] MRN: [id number] Date of Birth: 08/23/1935   Medicare Important Message Given:  Yes     Caren Macadam 06/03/2022, 9:52 AM

## 2022-06-03 NOTE — Progress Notes (Signed)
[facility] PROGRESS NOTE   [name] [name] [id number] DOB: 1935-10-27 DOA: 05/31/2022  PCP: Leanna Battles (Inactive)  Brief History/Interval Summary: 86 y.o. female with medical history significant of aortic stenosis, osteoarthritis, CAD, ACE inhibitor induced cough, stage 3b CKD, depression, GERD, hepatitis B, hepatitis C, PACs, PVCs, hyperlipidemia, hypertensive heart disease, IBS, obesity, osteoarthritis of the knees with a history of TKR, osteoporosis, pancreatitis, pleural effusion, peptic ulcer disease, sleep apnea not on CPAP who was recently admitted and discharged by cardiology due to angina undergoing cardiac catheterization which showed severe CAD was not amenable to stent placement and the patient is not a candidate for CABG. She presented with complaints of shortness of breath and there was was concern for pulmonary edema. She was hospitalized for further management.    Consultants: Cardiology  Procedures: Echocardiogram     Subjective/Interval History: Complains of some shortness of breath this morning.  No chest pain.  Developed upset stomach after eating a sandwich last night.  Her daughter-in-law is at the bedside.     Assessment/Plan:  Acute on chronic combined systolic and diastolic CHF/elevated troponin/acute respiratory failure with hypoxia Patient does have known coronary artery disease.  Unfortunately none of the obstructions are amenable to intervention or CABG. CT angiogram did not show any PE but bilateral pleural effusions were noted. Previous echocardiogram from June showed EF of 65 to 70%. Echocardiogram was repeated and it shows that her systolic function has decreased significantly.  Her LVEF is now 25 to 30%.  She has global hypokinesis.  She has grade 2 diastolic dysfunction.  She has moderate to severe aortic stenosis. She was placed on furosemide but experienced a rise in creatinine.  Creatinine rose to 1.91 yesterday.  Improved to  1.5 today. Chest x-ray from this morning shows worsening CHF.  She feels more short of breath.  She is not hypoxic though. Discussed with cardiology.  Plan is just for medical management.  Will reattempt furosemide.  Will just need to tolerate a higher creatinine. ARB has been discontinued due to renal insufficiency.  Not a candidate for ACE inhibitor or Entresto. She is on beta-blocker and nitrates. She was on heparin and nitroglycerin infusion for 48 hours. Strict ins and outs and daily weights. Will consult palliative medicine.  Acute on chronic kidney disease stage IIIb/hypokalemia Baseline creatinine seems to be around 1-1.5.  Increasing creatinine likely due to diuretics.  Furosemide was held.  ARB was discontinued. Creatinine peaked at 1.91.  Improved to 1.5 today.  Furosemide to be resumed as discussed above.  Monitor urine output.  Check labs daily.  Avoid nephrotoxic agents.    Coronary artery disease As mentioned above she underwent cardiac catheterization recently and is not a candidate for intervention. Medical management being pursued.  Patient is on aspirin, Plavix, beta-blocker, and statin. ARB will be held due to rising creatinine.  Hyperlipidemia Continue statin.  Essential hypertension Continue medications as outlined earlier.  Continue to monitor blood pressures.    Normocytic anemia Likely anemia of chronic disease.  No evidence for overt bleeding.  GERD Continue famotidine and Protonix.  Sleep apnea Stable.  DVT Prophylaxis: Now off of IV heparin.  Will initiate subcutaneous heparin. Code Status: DNR Family Communication: Discussed with patient and her daughter-in-law Disposition Plan: Hopefully return home when improved. start mobilizing.  Status is: Inpatient Remains inpatient appropriate because: Pulmonary edema, elevated troponin    Medications: Scheduled:  Chlorhexidine Gluconate Cloth  6 each Topical Daily   clopidogrel  75 mg Oral  Daily    famotidine  20 mg Oral Daily   furosemide  40 mg Intravenous Daily   isosorbide mononitrate  60 mg Oral Daily   metoprolol tartrate  12.5 mg Oral BID   mometasone-formoterol  2 puff Inhalation BID   pantoprazole  40 mg Oral BID   polyethylene glycol  17 g Oral Daily   rosuvastatin  5 mg Oral Daily   Continuous:   RCV:[id number] **OR** acetaminophen, albuterol, alum & mag hydroxide-simeth, ondansetron **OR** ondansetron (ZOFRAN) IV, traZODone  Antibiotics: Anti-infectives (From admission, onward)    None       Objective:  Vital Signs  Vitals:   06/02/22 2157 06/03/22 0228 06/03/22 0635 06/03/22 0917  BP: (!) 134/97 109/78 109/80   Pulse: 77 73 79   Resp:  18 18   Temp:  97.9 F (36.6 C) 97.9 F (36.6 C)   TempSrc:  Oral Oral   SpO2:  100% 97% 98%  Weight:      Height:       No intake or output data in the 24 hours ending 06/03/22 1206  Filed Weights   06/01/22 0758 06/01/22 1800 06/02/22 0700  Weight: 69.4 kg 70.7 kg 70.1 kg    General appearance: Awake alert.  In no distress Resp: Crackles bilateral bases.  No wheezing or rhonchi. Cardio: S1-S2 is normal regular.  No S3-S4.  No rubs murmurs or bruit GI: Abdomen is soft.  Nontender nondistended.  Bowel sounds are present normal.  No masses organomegaly Extremities: No edema.  Full range of motion of lower extremities. Neurologic: Alert and oriented x3.  No focal neurological deficits.    Lab Results:  Data Reviewed: I have personally reviewed following labs and reports of the imaging studies  CBC: Recent Labs  Lab 05/31/22 1043 06/01/22 1027 06/02/22 0329  WBC 6.7 11.4* 9.9  HGB 10.7* 10.0* 9.8*  HCT 33.7* 31.1* 32.1*  MCV 92.3 92.8 95.3  PLT 181 176 167     Basic Metabolic Panel: Recent Labs  Lab 05/31/22 1043 05/31/22 2149 06/01/22 1027 06/02/22 0329 06/03/22 0711  NA 145  --  142 143 139  K 3.0*  --  4.4 4.9 4.3  CL 117*  --  112* 112* 110  CO2 18*  --  22 24 21*  GLUCOSE 141*   --  124* 96 127*  BUN 25*  --  38* 35* 35*  CREATININE 1.33*  --  1.70* 1.91* 1.57*  CALCIUM 8.1*  --  8.9 8.8* 8.8*  MG  --  2.6*  --   --   --      GFR: Estimated Creatinine Clearance: 23.3 mL/min (A) (by C-G formula based on SCr of 1.57 mg/dL (H)).  Liver Function Tests: Recent Labs  Lab 05/31/22 1043  AST 16  ALT 14  ALKPHOS 63  BILITOT 0.9  PROT 6.3*  ALBUMIN 3.3*     Coagulation Profile: Recent Labs  Lab 05/31/22 1043  INR 1.2      BNP (last 3 results) Recent Labs    04/20/22 1144  PROBNP 1,771*      Recent Results (from the past 240 hour(s))  Resp panel by RT-PCR (RSV, Flu A&B, Covid) Anterior Nasal Swab     Status: None   Collection Time: 05/31/22 10:34 AM   Specimen: Anterior Nasal Swab  Result Value Ref Range Status   SARS Coronavirus 2 by RT PCR NEGATIVE NEGATIVE Final    Comment: (NOTE) SARS-CoV-2 target nucleic acids are NOT  DETECTED.  The SARS-CoV-2 RNA is generally detectable in upper respiratory specimens during the acute phase of infection. The lowest concentration of SARS-CoV-2 viral copies this assay can detect is 138 copies/mL. A negative result does not preclude SARS-Cov-2 infection and should not be used as the sole basis for treatment or other patient management decisions. A negative result may occur with  improper specimen collection/handling, submission of specimen other than nasopharyngeal swab, presence of viral mutation(s) within the areas targeted by this assay, and inadequate number of viral copies(<138 copies/mL). A negative result must be combined with clinical observations, patient history, and epidemiological information. The expected result is Negative.  Fact Sheet for Patients:  EntrepreneurPulse.com.au  Fact Sheet for Healthcare Providers:  IncredibleEmployment.be  This test is no t yet approved or cleared by the [place] FDA and  has been authorized for detection and/or  diagnosis of SARS-CoV-2 by FDA under an Emergency Use Authorization (EUA). This EUA will remain  in effect (meaning this test can be used) for the duration of the COVID-19 declaration under Section 564(b)(1) of the Act, 21 U.S.C.section 360bbb-3(b)(1), unless the authorization is terminated  or revoked sooner.       Influenza A by PCR NEGATIVE NEGATIVE Final   Influenza B by PCR NEGATIVE NEGATIVE Final    Comment: (NOTE) The Xpert Xpress SARS-CoV-2/FLU/RSV plus assay is intended as an aid in the diagnosis of influenza from Nasopharyngeal swab specimens and should not be used as a sole basis for treatment. Nasal washings and aspirates are unacceptable for Xpert Xpress SARS-CoV-2/FLU/RSV testing.  Fact Sheet for Patients: EntrepreneurPulse.com.au  Fact Sheet for Healthcare Providers: IncredibleEmployment.be  This test is not yet approved or cleared by the [place] FDA and has been authorized for detection and/or diagnosis of SARS-CoV-2 by FDA under an Emergency Use Authorization (EUA). This EUA will remain in effect (meaning this test can be used) for the duration of the COVID-19 declaration under Section 564(b)(1) of the Act, 21 U.S.C. section 360bbb-3(b)(1), unless the authorization is terminated or revoked.     Resp Syncytial Virus by PCR NEGATIVE NEGATIVE Final    Comment: (NOTE) Fact Sheet for Patients: EntrepreneurPulse.com.au  Fact Sheet for Healthcare Providers: IncredibleEmployment.be  This test is not yet approved or cleared by the [place] FDA and has been authorized for detection and/or diagnosis of SARS-CoV-2 by FDA under an Emergency Use Authorization (EUA). This EUA will remain in effect (meaning this test can be used) for the duration of the COVID-19 declaration under Section 564(b)(1) of the Act, 21 U.S.C. section 360bbb-3(b)(1), unless the authorization is terminated  or revoked.  Performed at [facility], [place] [address] [place], [place] [address]   MRSA Next Gen by PCR, Nasal     Status: None   Collection Time: 06/01/22  6:38 PM   Specimen: Nasal Mucosa; Nasal Swab  Result Value Ref Range Status   MRSA by PCR Next Gen NOT DETECTED NOT DETECTED Final    Comment: (NOTE) The GeneXpert MRSA Assay (FDA approved for NASAL specimens only), is one component of a comprehensive MRSA colonization surveillance program. It is not intended to diagnose MRSA infection nor to guide or monitor treatment for MRSA infections. Test performance is not FDA approved in patients less than 59 years old. Performed at [facility], [place] [address] [place], [place] [address]       Radiology Studies: DG CHEST PORT 1 VIEW  Result Date: 06/03/2022 CLINICAL DATA:  Pulmonary edema EXAM: PORTABLE CHEST  1 VIEW COMPARISON:  Portable exam 0437 hours compared to 05/31/2022 FINDINGS: Enlargement of cardiac silhouette with pulmonary vascular congestion. Atherosclerotic calcification aorta. Diffuse BILATERAL pulmonary infiltrates consistent with pulmonary edema and CHF, slightly increased from previous exam. No definite pleural effusion or pneumothorax. Bones demineralized. IMPRESSION: Increased CHF versus previous study. Aortic Atherosclerosis (ICD10-I70.0). Electronically Signed   By: Ulyses Southward M.D.   On: 06/03/2022 07:43   ECHOCARDIOGRAM COMPLETE  Result Date: 06/01/2022    ECHOCARDIOGRAM REPORT   Patient Name:   [name] [name] Date of Exam: 06/01/2022 Medical Rec #:  [id number]             Height:       60.0 in Accession #:    [id number]            Weight:       153.0 lb Date of Birth:  [birth year]              BSA:          1.666 m Patient Age:    86 years              BP:           111/76 mmHg Patient Gender: F                     HR:           76 bpm. Exam Location:  Inpatient Procedure: 2D Echo and Intracardiac Opacification Agent  Indications:    CHF  History:        Patient has prior history of Echocardiogram examinations, most                 recent 11/26/2021. CAD; Risk Factors:Hypertension and                 Dyslipidemia.  Sonographer:    Cathie Hoops Referring Phys: [phone] [facility]  Sonographer Comments: Technically difficult study due to poor echo windows. Image acquisition challenging due to respiratory motion. IMPRESSIONS  1. Left ventricular ejection fraction, by estimation, is 25 to 30%. The left ventricle has severely decreased function. There is global hypokinesis with akinesis of the inferior and all septal LV segments concerning for multivessel CAD. Left ventricular  diastolic parameters are consistent with Grade II diastolic dysfunction (pseudonormalization). Elevated left atrial pressure.  2. Right ventricular systolic function is moderately reduced. The right ventricular size is not well visualized. There is mildly elevated pulmonary artery systolic pressure.  3. Left atrial size was moderately dilated.  4. The mitral valve is degenerative. There is tethering of the posterior mitral valve leaflet with resultant moderate mitral valve regurgitation. Moderate mitral annular calcification.  5. Moderate-to-severe low flow, low gradient aortic stenosis with mean graident , Vmax 35m/s, AVA 1.0, DI 0.29. The aortic valve is calcified. There is severe calcifcation of the aortic valve. There is severe thickening of the aortic valve. Aortic valve regurgitation is not visualized. Moderate to severe aortic valve stenosis.  6. The inferior vena cava is dilated in size with <50% respiratory variability, suggesting right atrial pressure of 15 mmHg. Comparison(s): Compared to prior TTE on 11/2021, the LVEF has dropped significantly from 65-70% to 25-30% with wall motion abnormalities as described above. FINDINGS  Left Ventricle: There is global hypokinesis with akinesis of the inferior and all septal LV segments concerning for  multivessel CAD. Left ventricular ejection fraction, by estimation, is 25 to 30%. The left ventricle has severely decreased function. Definity  contrast agent was given IV to delineate the left ventricular endocardial borders. The left ventricular internal cavity size was mildly dilated. There is no left ventricular hypertrophy. Left ventricular diastolic parameters are consistent with Grade II diastolic dysfunction (pseudonormalization). Elevated left atrial pressure. Right Ventricle: The right ventricular size is not well visualized. Right vetricular wall thickness was not well visualized. Right ventricular systolic function is moderately reduced. There is mildly elevated pulmonary artery systolic pressure. The tricuspid regurgitant velocity is 2.89 m/s, and with an assumed right atrial pressure of 3 mmHg, the estimated right ventricular systolic pressure is [id number] mmHg. Left Atrium: Left atrial size was moderately dilated. Right Atrium: Right atrial size was normal in size. Pericardium: There is no evidence of pericardial effusion. Mitral Valve: The mitral valve is degenerative in appearance. There is moderate thickening of the mitral valve leaflet(s). There is mild calcification of the mitral valve leaflet(s). Moderate mitral annular calcification. There is tethering of the posterior mitral valve leaflet with resultant moderate mitral valve regurgitation. MV peak gradient, 6.2 mmHg. The mean mitral valve gradient is 2.0 mmHg. Tricuspid Valve: The tricuspid valve is grossly normal. Tricuspid valve regurgitation is trivial. Aortic Valve: Moderate-to-severe low flow, low gradient aortic stenosis with mean graident 23mmHg, Vmax 44m/s, AVA 1.0, DI 0.29. The aortic valve is calcified. There is severe calcifcation of the aortic valve. There is severe thickening of the aortic valve. Aortic valve regurgitation is not visualized. Moderate to severe aortic stenosis is present. Aortic valve mean gradient measures 9.0 mmHg.  Aortic valve peak gradient measures 15.1 mmHg. Aortic valve area, by VTI measures 0.92 cm. Pulmonic Valve: The pulmonic valve was not well visualized. Pulmonic valve regurgitation is trivial. Aorta: The aortic root is normal in size and structure. Venous: The inferior vena cava is dilated in size with less than 50% respiratory variability, suggesting right atrial pressure of 15 mmHg. IAS/Shunts: The atrial septum is grossly normal.  LEFT VENTRICLE PLAX 2D LVIDd:         4.70 cm      Diastology LVIDs:         4.10 cm      LV e' medial:    5.33 cm/s LV PW:         1.10 cm      LV E/e' medial:  22.1 LV IVS:        1.10 cm      LV e' lateral:   6.64 cm/s LVOT diam:     1.90 cm      LV E/e' lateral: 17.8 LV SV:         35 LV SV Index:   21 LVOT Area:     2.84 cm  LV Volumes (MOD) LV vol d, MOD A2C: 173.0 ml LV vol d, MOD A4C: 127.0 ml LV vol s, MOD A2C: 131.0 ml LV vol s, MOD A4C: 89.6 ml LV SV MOD A2C:     42.0 ml LV SV MOD A4C:     127.0 ml LV SV MOD BP:      42.7 ml RIGHT VENTRICLE RV Basal diam:  3.80 cm RV Mid diam:    3.40 cm RV S prime:     7.94 cm/s TAPSE (M-mode): 1.6 cm LEFT ATRIUM             Index        RIGHT ATRIUM           Index LA diam:        3.30 cm 1.98  cm/m   RA Area:     12.80 cm LA Vol (A2C):   56.2 ml 33.74 ml/m  RA Volume:   29.30 ml  17.59 ml/m LA Vol (A4C):   70.9 ml 42.56 ml/m LA Biplane Vol: 66.2 ml 39.74 ml/m  AORTIC VALVE                     PULMONIC VALVE AV Area (Vmax):    0.91 cm      PV Vmax:       1.28 m/s AV Area (Vmean):   0.91 cm      PV Peak grad:  6.6 mmHg AV Area (VTI):     0.92 cm AV Vmax:           194.00 cm/s AV Vmean:          137.500 cm/s AV VTI:            0.381 m AV Peak Grad:      15.1 mmHg AV Mean Grad:      9.0 mmHg LVOT Vmax:         62.10 cm/s LVOT Vmean:        43.900 cm/s LVOT VTI:          0.123 m LVOT/AV VTI ratio: 0.32  AORTA Ao Root diam: 3.10 cm Ao Asc diam:  2.90 cm MITRAL VALVE                  TRICUSPID VALVE MV Area (PHT): 4.63 cm       TR Peak  grad:   33.4 mmHg MV Area VTI:   1.11 cm       TR Vmax:        289.00 cm/s MV Peak grad:  6.2 mmHg MV Mean grad:  2.0 mmHg       SHUNTS MV Vmax:       1.25 m/s       Systemic VTI:  0.12 m MV Vmean:      60.4 cm/s      Systemic Diam: 1.90 cm MV Decel Time: 164 msec MR Peak grad:    73.4 mmHg MR Mean grad:    47.0 mmHg MR Vmax:         428.50 cm/s MR Vmean:        329.0 cm/s MR PISA:         3.08 cm MR PISA Eff ROA: 20 mm MR PISA Radius:  0.70 cm MV E velocity: 118.00 cm/s MV A velocity: 82.50 cm/s MV E/A ratio:  1.43 Gwyndolyn Kaufman MD Electronically signed by Gwyndolyn Kaufman MD Signature Date/Time: 06/01/2022/5:19:45 PM    Final        LOS: 3 days   [facility] Pager on [email or web address]  06/03/2022, 12:06 PM

## 2022-06-04 DIAGNOSIS — I501 Left ventricular failure: Secondary | ICD-10-CM | POA: Diagnosis not present

## 2022-06-04 DIAGNOSIS — R7989 Other specified abnormal findings of blood chemistry: Secondary | ICD-10-CM

## 2022-06-04 DIAGNOSIS — N1832 Chronic kidney disease, stage 3b: Secondary | ICD-10-CM | POA: Diagnosis not present

## 2022-06-04 DIAGNOSIS — I25118 Atherosclerotic heart disease of native coronary artery with other forms of angina pectoris: Secondary | ICD-10-CM | POA: Diagnosis not present

## 2022-06-04 LAB — BASIC METABOLIC PANEL
Anion gap: 8 (ref 5–15)
BUN: 36 mg/dL — ABNORMAL HIGH (ref 8–23)
CO2: 22 mmol/L (ref 22–32)
Calcium: 8.6 mg/dL — ABNORMAL LOW (ref 8.9–10.3)
Chloride: 110 mmol/L (ref 98–111)
Creatinine, Ser: 1.47 mg/dL — ABNORMAL HIGH (ref 0.44–1.00)
GFR, Estimated: 35 mL/min — ABNORMAL LOW (ref >60)
Glucose, Bld: 124 mg/dL — ABNORMAL HIGH (ref 70–99)
Potassium: 4.1 mmol/L (ref 3.5–5.1)
Sodium: 140 mmol/L (ref 135–145)

## 2022-06-04 LAB — CBC
HCT: 30.4 % — ABNORMAL LOW (ref 36.0–46.0)
Hemoglobin: 9.7 g/dL — ABNORMAL LOW (ref 12.0–15.0)
MCH: 29.1 pg (ref 26.0–34.0)
MCHC: 31.9 g/dL (ref 30.0–36.0)
MCV: 91.3 fL (ref 80.0–100.0)
Platelets: 162 10*3/uL (ref 150–400)
RBC: 3.33 MIL/uL — ABNORMAL LOW (ref 3.87–5.11)
RDW: 14.9 % (ref 11.5–15.5)
WBC: 7 10*3/uL (ref 4.0–10.5)
nRBC: 0 % (ref 0.0–0.2)

## 2022-06-04 MED ORDER — HEPARIN SODIUM (PORCINE) 5000 UNIT/ML IJ SOLN
5000.0000 [IU] | Freq: Three times a day (TID) | INTRAMUSCULAR | Status: DC
  Administered 2022-06-04 – 2022-06-05 (×4): 5000 [IU] via SUBCUTANEOUS
  Filled 2022-06-04 (×4): qty 1

## 2022-06-04 MED ORDER — LOSARTAN POTASSIUM 50 MG PO TABS
25.0000 mg | ORAL_TABLET | Freq: Every day | ORAL | Status: DC
  Administered 2022-06-04 – 2022-06-05 (×2): 25 mg via ORAL
  Filled 2022-06-04 (×2): qty 1

## 2022-06-04 MED ORDER — FUROSEMIDE 40 MG PO TABS
40.0000 mg | ORAL_TABLET | Freq: Every day | ORAL | Status: DC
  Administered 2022-06-04 – 2022-06-05 (×2): 40 mg via ORAL
  Filled 2022-06-04 (×2): qty 1

## 2022-06-04 MED ORDER — METOPROLOL SUCCINATE ER 25 MG PO TB24
12.5000 mg | ORAL_TABLET | Freq: Every day | ORAL | Status: DC
  Administered 2022-06-04 – 2022-06-05 (×2): 12.5 mg via ORAL
  Filled 2022-06-04 (×2): qty 1

## 2022-06-04 NOTE — Progress Notes (Addendum)
PROGRESS NOTE    [name] [name]  [id number] DOB: 1935/12/07 DOA: 05/31/2022 PCP: Leanna Battles (Inactive)   Brief Narrative:   86 y.o. female with medical history significant of aortic stenosis, osteoarthritis, CAD, ACE inhibitor induced cough, stage 3b CKD, depression, GERD, hepatitis B, hepatitis C, PACs, PVCs, hyperlipidemia, hypertensive heart disease, IBS, obesity, osteoarthritis of the knees with a history of TKR, osteoporosis, pancreatitis, pleural effusion, peptic ulcer disease, sleep apnea not on CPAP who was recently admitted and discharged by cardiology due to angina undergoing cardiac catheterization which showed severe CAD was not amenable to stent placement and the patient is not a candidate for CABG. She presented with complaints of shortness of breath and there was was concern for pulmonary edema. She was hospitalized for further management.   She was started on IV Lasix.  Cardiology was consulted.  Echo showed EF of 25 to 30%.  Assessment & Plan:   Acute on chronic combined systolic and diastolic CHF Elevated troponin/non-STEMI CAD (severe left main and three-vessel disease without targets for CABG or PCI) Moderate to severe aortic stenosis Acute respite failure with hypoxia -No PE on CTA chest but bilateral pleural effusions -Echo during this admission showed EF of 25 to 30% with global hypokinesis (prior EF of 65 to 70% in June 2023) and grade 2 diastolic dysfunction along with moderate to severe aortic stenosis -IV Lasix has been restarted by cardiology because of worsening chest x-ray. -Cardiology following.  Continue Imdur, metoprolol, statin, Plavix.  ARB has been discontinued due to renal insufficiency.  Not a candidate for ACE inhibitor or Entresto -Patient has completed 48 hours of heparin and nitroglycerin infusions -Continue strict input and output.  Daily weights.  Fluid restriction.  Overall prognosis is poor.  Palliative care consultation is  pending. -Currently on room air.  Acute on chronic kidney disease stage IIIb Metabolic acidosis: Mild.  Monitor -Creatinine peaked to 1.91.  Improving.  Creatinine pending for today.  Monitor.  Diuretics as above  Anemia of chronic disease -Hemoglobin stable.  No signs of bleeding.  Monitor intermittently  Leukocytosis -Resolved  Hyperlipidemia -Continue statin  Essential hypertension -Continue antihypertensives as above.  Monitor blood pressure  GERD -Continue famotidine and Protonix  Sleep apnea -Stable  Moderate malnutrition -Follow nutrition recommendations   DVT prophylaxis: Start subcutaneous heparin Code Status: DNR Family Communication: Niece at bedside Disposition Plan: Status is: Inpatient Remains inpatient appropriate because: Of severity of illness.    Consultants: Cardiology  Procedures: Echo  Antimicrobials: None   Subjective: Patient seen and examined at bedside.  Denies current chest pain.  Still short of breath with minimal exertion.  No fever or vomiting reported.  Objective: Vitals:   06/03/22 2032 06/03/22 2225 06/03/22 2227 06/04/22 0506  BP: 106/67 124/60  108/67  Pulse: 81  79 74  Resp: 18   18  Temp: 98 F (36.7 C)   97.7 F (36.5 C)  TempSrc: Oral   Oral  SpO2: 100%   (!) 88%  Weight:      Height:       No intake or output data in the 24 hours ending 06/04/22 0741 Filed Weights   06/01/22 0758 06/01/22 1800 06/02/22 0700  Weight: 69.4 kg 70.7 kg 70.1 kg    Examination:  General exam: Appears calm and comfortable.  Looks chronically ill and deconditioned.  Currently on 2 L oxygen via nasal cannula.   Respiratory system: Bilateral decreased breath sounds at bases with basilar crackles Cardiovascular system: S1 & S2  heard, Rate controlled Gastrointestinal system: Abdomen is nondistended, soft and nontender. Normal bowel sounds heard. Extremities: No cyanosis, clubbing; trace lower extremity edema  Central nervous system:  Alert and oriented. No focal neurological deficits. Moving extremities Skin: No rashes, lesions or ulcers Psychiatry: Flat affect.  No signs of agitation.  Data Reviewed: I have personally reviewed following labs and imaging studies  CBC: Recent Labs  Lab 05/31/22 1043 06/01/22 1027 06/02/22 0329  WBC 6.7 11.4* 9.9  HGB 10.7* 10.0* 9.8*  HCT 33.7* 31.1* 32.1*  MCV 92.3 92.8 95.3  PLT 181 176 [id number]   Basic Metabolic Panel: Recent Labs  Lab 05/31/22 1043 05/31/22 2149 06/01/22 1027 06/02/22 0329 06/03/22 0711  NA 145  --  142 143 139  K 3.0*  --  4.4 4.9 4.3  CL 117*  --  112* 112* 110  CO2 18*  --  22 24 21*  GLUCOSE 141*  --  124* 96 127*  BUN 25*  --  38* 35* 35*  CREATININE 1.33*  --  1.70* 1.91* 1.57*  CALCIUM 8.1*  --  8.9 8.8* 8.8*  MG  --  2.6*  --   --   --    GFR: Estimated Creatinine Clearance: 23.3 mL/min (A) (by C-G formula based on SCr of 1.57 mg/dL (H)). Liver Function Tests: Recent Labs  Lab 05/31/22 1043  AST 16  ALT 14  ALKPHOS 63  BILITOT 0.9  PROT 6.3*  ALBUMIN 3.3*   No results for input(s): "LIPASE", "AMYLASE" in the last 168 hours. No results for input(s): "AMMONIA" in the last 168 hours. Coagulation Profile: Recent Labs  Lab 05/31/22 1043  INR 1.2   Cardiac Enzymes: No results for input(s): "CKTOTAL", "CKMB", "CKMBINDEX", "TROPONINI" in the last 168 hours. BNP (last 3 results) Recent Labs    04/20/22 1144  PROBNP 1,771*   HbA1C: No results for input(s): "HGBA1C" in the last 72 hours. CBG: No results for input(s): "GLUCAP" in the last 168 hours. Lipid Profile: No results for input(s): "CHOL", "HDL", "LDLCALC", "TRIG", "CHOLHDL", "LDLDIRECT" in the last 72 hours. Thyroid Function Tests: No results for input(s): "TSH", "T4TOTAL", "FREET4", "T3FREE", "THYROIDAB" in the last 72 hours. Anemia Panel: No results for input(s): "VITAMINB12", "FOLATE", "FERRITIN", "TIBC", "IRON", "RETICCTPCT" in the last 72 hours. Sepsis Labs: No  results for input(s): "PROCALCITON", "LATICACIDVEN" in the last 168 hours.  Recent Results (from the past 240 hour(s))  Resp panel by RT-PCR (RSV, Flu A&B, Covid) Anterior Nasal Swab     Status: None   Collection Time: 05/31/22 10:34 AM   Specimen: Anterior Nasal Swab  Result Value Ref Range Status   SARS Coronavirus 2 by RT PCR NEGATIVE NEGATIVE Final    Comment: (NOTE) SARS-CoV-2 target nucleic acids are NOT DETECTED.  The SARS-CoV-2 RNA is generally detectable in upper respiratory specimens during the acute phase of infection. The lowest concentration of SARS-CoV-2 viral copies this assay can detect is 138 copies/mL. A negative result does not preclude SARS-Cov-2 infection and should not be used as the sole basis for treatment or other patient management decisions. A negative result may occur with  improper specimen collection/handling, submission of specimen other than nasopharyngeal swab, presence of viral mutation(s) within the areas targeted by this assay, and inadequate number of viral copies(<138 copies/mL). A negative result must be combined with clinical observations, patient history, and epidemiological information. The expected result is Negative.  Fact Sheet for Patients:  EntrepreneurPulse.com.au  Fact Sheet for Healthcare Providers:  IncredibleEmployment.be  This  test is no t yet approved or cleared by the [place] and  has been authorized for detection and/or diagnosis of SARS-CoV-2 by FDA under an Emergency Use Authorization (EUA). This EUA will remain  in effect (meaning this test can be used) for the duration of the COVID-19 declaration under Section 564(b)(1) of the Act, 21 U.S.C.section 360bbb-3(b)(1), unless the authorization is terminated  or revoked sooner.       Influenza A by PCR NEGATIVE NEGATIVE Final   Influenza B by PCR NEGATIVE NEGATIVE Final    Comment: (NOTE) The Xpert Xpress SARS-CoV-2/FLU/RSV  plus assay is intended as an aid in the diagnosis of influenza from Nasopharyngeal swab specimens and should not be used as a sole basis for treatment. Nasal washings and aspirates are unacceptable for Xpert Xpress SARS-CoV-2/FLU/RSV testing.  Fact Sheet for Patients: BloggerCourse.com  Fact Sheet for Healthcare Providers: SeriousBroker.it  This test is not yet approved or cleared by the [place] FDA and has been authorized for detection and/or diagnosis of SARS-CoV-2 by FDA under an Emergency Use Authorization (EUA). This EUA will remain in effect (meaning this test can be used) for the duration of the COVID-19 declaration under Section 564(b)(1) of the Act, 21 U.S.C. section 360bbb-3(b)(1), unless the authorization is terminated or revoked.     Resp Syncytial Virus by PCR NEGATIVE NEGATIVE Final    Comment: (NOTE) Fact Sheet for Patients: BloggerCourse.com  Fact Sheet for Healthcare Providers: SeriousBroker.it  This test is not yet approved or cleared by the [place] FDA and has been authorized for detection and/or diagnosis of SARS-CoV-2 by FDA under an Emergency Use Authorization (EUA). This EUA will remain in effect (meaning this test can be used) for the duration of the COVID-19 declaration under Section 564(b)(1) of the Act, 21 U.S.C. section 360bbb-3(b)(1), unless the authorization is terminated or revoked.  Performed at [facility], [address] [place], [place] [address]   MRSA Next Gen by PCR, Nasal     Status: None   Collection Time: 06/01/22  6:38 PM   Specimen: Nasal Mucosa; Nasal Swab  Result Value Ref Range Status   MRSA by PCR Next Gen NOT DETECTED NOT DETECTED Final    Comment: (NOTE) The GeneXpert MRSA Assay (FDA approved for NASAL specimens only), is one component of a comprehensive MRSA colonization  surveillance program. It is not intended to diagnose MRSA infection nor to guide or monitor treatment for MRSA infections. Test performance is not FDA approved in patients less than 5 years old. Performed at [facility], [address], [place], [place] [address]          Radiology Studies: DG CHEST PORT 1 VIEW  Result Date: 06/03/2022 CLINICAL DATA:  Pulmonary edema EXAM: PORTABLE CHEST 1 VIEW COMPARISON:  Portable exam 0437 hours compared to 05/31/2022 FINDINGS: Enlargement of cardiac silhouette with pulmonary vascular congestion. Atherosclerotic calcification aorta. Diffuse BILATERAL pulmonary infiltrates consistent with pulmonary edema and CHF, slightly increased from previous exam. No definite pleural effusion or pneumothorax. Bones demineralized. IMPRESSION: Increased CHF versus previous study. Aortic Atherosclerosis (ICD10-I70.0). Electronically Signed   By: Ulyses Southward M.D.   On: 06/03/2022 07:43        Scheduled Meds:  Chlorhexidine Gluconate Cloth  6 each Topical Daily   clopidogrel  75 mg Oral Daily   famotidine  20 mg Oral Daily   feeding supplement  1 Container Oral TID BM   furosemide  40 mg Intravenous Daily   isosorbide  mononitrate  60 mg Oral Daily   metoprolol tartrate  12.5 mg Oral BID   mometasone-formoterol  2 puff Inhalation BID   multivitamin with minerals  1 tablet Oral Daily   pantoprazole  40 mg Oral BID   polyethylene glycol  17 g Oral Daily   rosuvastatin  5 mg Oral Daily   Continuous Infusions:        Aline August, MD [facility] 06/04/2022, 7:41 AM

## 2022-06-04 NOTE — Progress Notes (Signed)
Mobility Specialist - Progress Note   06/04/22 1434  Mobility  Activity Ambulated independently in hallway  Level of Assistance Independent  Assistive Device None  Distance Ambulated (ft) 500 ft  Activity Response Tolerated well  Mobility Referral Yes  $Mobility charge 1 Mobility   Pt received EOB and agreeable to mobility. No complaints during mobility session. Pt to EOB after session with all needs met.    [facility]

## 2022-06-04 NOTE — Progress Notes (Signed)
Rounding Note    Patient Name: [name] [name] Date of Encounter: 06/04/2022  [place] Cardiologist: Larae Grooms, MD   Subjective   No CP or dyspnea  Inpatient Medications    Scheduled Meds:  Chlorhexidine Gluconate Cloth  6 each Topical Daily   clopidogrel  75 mg Oral Daily   famotidine  20 mg Oral Daily   feeding supplement  1 Container Oral TID BM   furosemide  40 mg Intravenous Daily   heparin injection (subcutaneous)  5,000 Units Subcutaneous Q8H   isosorbide mononitrate  60 mg Oral Daily   metoprolol tartrate  12.5 mg Oral BID   mometasone-formoterol  2 puff Inhalation BID   multivitamin with minerals  1 tablet Oral Daily   pantoprazole  40 mg Oral BID   polyethylene glycol  17 g Oral Daily   rosuvastatin  5 mg Oral Daily   Continuous Infusions:  PRN Meds: acetaminophen **OR** acetaminophen, albuterol, alum & mag hydroxide-simeth, ondansetron **OR** ondansetron (ZOFRAN) IV, traZODone   Vital Signs    Vitals:   06/03/22 2032 06/03/22 2225 06/03/22 2227 06/04/22 0506  BP: 106/67 124/60  108/67  Pulse: 81  79 74  Resp: 18   18  Temp: 98 F (36.7 C)   97.7 F (36.5 C)  TempSrc: Oral   Oral  SpO2: 100%   (!) 88%  Weight:      Height:       No intake or output data in the 24 hours ending 06/04/22 0820    06/02/2022    7:00 AM 06/01/2022    6:00 PM 06/01/2022    7:58 AM  Last 3 Weights  Weight (lbs) 154 lb 8.7 oz 155 lb 13.8 oz 153 lb  Weight (kg) 70.1 kg 70.7 kg 69.4 kg      Telemetry    NSR with PACs and PVCs - Personally Reviewed   Physical Exam   GEN: No acute distress.   Neck: No JVD Cardiac: RRR, 2/6 systolic murmur Respiratory: Diminished BS RLL GI: Soft, nontender, non-distended  MS: No edema; No deformity. Neuro:  Nonfocal  Psych: Normal affect   Labs    High Sensitivity Troponin:   Recent Labs  Lab 05/31/22 1043 05/31/22 1230 05/31/22 2141 06/01/22 0553  TROPONINIHS 190* 226* 2,696* 1,995*      Chemistry Recent Labs  Lab 05/31/22 1043 05/31/22 2149 06/01/22 1027 06/02/22 0329 06/03/22 0711  NA 145  --  142 143 139  K 3.0*  --  4.4 4.9 4.3  CL 117*  --  112* 112* 110  CO2 18*  --  22 24 21*  GLUCOSE 141*  --  124* 96 127*  BUN 25*  --  38* 35* 35*  CREATININE 1.33*  --  1.70* 1.91* 1.57*  CALCIUM 8.1*  --  8.9 8.8* 8.8*  MG  --  2.6*  --   --   --   PROT 6.3*  --   --   --   --   ALBUMIN 3.3*  --   --   --   --   AST 16  --   --   --   --   ALT 14  --   --   --   --   ALKPHOS 63  --   --   --   --   BILITOT 0.9  --   --   --   --   GFRNONAA 39*  --  29*  26* 32*  ANIONGAP 10  --  8 7 8     Hematology Recent Labs  Lab 06/01/22 1027 06/02/22 0329 06/04/22 0749  WBC 11.4* 9.9 7.0  RBC 3.35* 3.37* 3.33*  HGB 10.0* 9.8* 9.7*  HCT 31.1* 32.1* 30.4*  MCV 92.8 95.3 91.3  MCH 29.9 29.1 29.1  MCHC 32.2 30.5 31.9  RDW 15.0 15.2 14.9  PLT 176 167 162    BNP Recent Labs  Lab 05/31/22 1034  BNP 4,012.1*    DDimer  Recent Labs  Lab 05/31/22 1043  DDIMER 1.82*     Radiology    DG CHEST PORT 1 VIEW  Result Date: 06/03/2022 CLINICAL DATA:  Pulmonary edema EXAM: PORTABLE CHEST 1 VIEW COMPARISON:  Portable exam 0437 hours compared to 05/31/2022 FINDINGS: Enlargement of cardiac silhouette with pulmonary vascular congestion. Atherosclerotic calcification aorta. Diffuse BILATERAL pulmonary infiltrates consistent with pulmonary edema and CHF, slightly increased from previous exam. No definite pleural effusion or pneumothorax. Bones demineralized. IMPRESSION: Increased CHF versus previous study. Aortic Atherosclerosis (ICD10-I70.0). Electronically Signed   By: 06/02/2022 M.D.   On: 06/03/2022 07:43    Patient Profile     86 y.o. female with past medical history of coronary artery disease, aortic stenosis, hypertension, PVCs, rheumatoid arthritis, chronic stage IIIb kidney disease admitted with dyspnea.  Patient had cardiac catheterization November 2023 that showed  severe three-vessel coronary artery disease including 90% left main, occluded distal LAD, occluded right coronary artery.  She has been treated medically as she was felt not to be a candidate for coronary artery bypass graft and there were no good PCI options.  Echocardiogram this admission shows ejection fraction 25 to 30%, grade 2 diastolic dysfunction, moderate RV dysfunction, moderate left atrial enlargement, moderate mitral regurgitation, moderate to severe low-flow low gradient aortic stenosis with mean gradient 9 mmHg, aortic valve area 1 cm and dimensionless index 0.29.  CTA showed no pulmonary embolus but there was note of moderate bilateral pleural effusions.  Follow-up chest x-ray December 2019 showed worsening CHF.  Patient also ruled in for non-ST elevation myocardial infarction.  Assessment & Plan    1 coronary artery disease/non-ST elevation previous catheterization as outlined above shows severe three-vessel coronary artery disease including severe left main disease.  She was felt not to be a candidate for coronary artery bypass and graft and no good options for PCI.  She has therefore been treated medically.  She is not having chest pain.  Will continue Plavix (pt has allergy to ASA), isosorbide, beta-blocker and Crestor.  2 cardiomyopathy-ejection fraction severely reduced on recent echocardiogram.  Will plan medical therapy.  Resume losartan 25 mg daily.  Change metoprolol to Toprol 12.5 mg daily.  Could consider adding spironolactone or SGLT2 inhibitor later as renal function/blood pressure allow.  However she has severe coronary disease and is a no CODE BLUE.  3 acute systolic congestive heart failure-volume status has improved.  Will transition Lasix to 40 mg by mouth daily.  4 acute on chronic stage IIIb kidney disease-follow renal function closely.  5 severe aortic stenosis-noted on follow-up echocardiogram.  Note with severity of coronary disease she is not a candidate for TAVR  or other interventions.  6 no CODE BLUE    For questions or updates, please contact [facility] Please consult [email or web address] for contact info under        Signed, January 2020, MD  06/04/2022, 8:21 AM

## 2022-06-04 NOTE — Progress Notes (Signed)
Mobility Specialist - Progress Note   06/04/22 1022  Oxygen Therapy  O2 Device Room Air  Mobility  Activity Ambulated independently in hallway;Ambulated independently to bathroom  Level of Assistance Independent  Assistive Device None  Distance Ambulated (ft) 310 ft  Activity Response Tolerated well  Mobility Referral Yes  $Mobility charge 1 Mobility   Pt received in bed and agreeable to mobility. No complaints during mobility. Pt to recliner after session with all needs met.    Pre-mobility: 81 HR, 96% SpO2 (RA) During mobility: 89 HR, 98% SpO2 (RA) Post-mobility: 94 HR, 97% SPO2 (RA)  Set designer

## 2022-06-05 DIAGNOSIS — I25118 Atherosclerotic heart disease of native coronary artery with other forms of angina pectoris: Secondary | ICD-10-CM | POA: Diagnosis not present

## 2022-06-05 DIAGNOSIS — N1832 Chronic kidney disease, stage 3b: Secondary | ICD-10-CM | POA: Diagnosis not present

## 2022-06-05 DIAGNOSIS — I501 Left ventricular failure: Secondary | ICD-10-CM | POA: Diagnosis not present

## 2022-06-05 LAB — BASIC METABOLIC PANEL
Anion gap: 8 (ref 5–15)
BUN: 33 mg/dL — ABNORMAL HIGH (ref 8–23)
CO2: 25 mmol/L (ref 22–32)
Calcium: 8.8 mg/dL — ABNORMAL LOW (ref 8.9–10.3)
Chloride: 109 mmol/L (ref 98–111)
Creatinine, Ser: 1.48 mg/dL — ABNORMAL HIGH (ref 0.44–1.00)
GFR, Estimated: 35 mL/min — ABNORMAL LOW (ref >60)
Glucose, Bld: 156 mg/dL — ABNORMAL HIGH (ref 70–99)
Potassium: 4.5 mmol/L (ref 3.5–5.1)
Sodium: 142 mmol/L (ref 135–145)

## 2022-06-05 LAB — MAGNESIUM: Magnesium: 2.3 mg/dL (ref 1.7–2.4)

## 2022-06-05 MED ORDER — FUROSEMIDE 20 MG PO TABS: 40.0000 mg | ORAL_TABLET | Freq: Every day | ORAL | Status: AC

## 2022-06-05 MED ORDER — POLYETHYLENE GLYCOL 3350 17 G PO PACK: 17.0000 g | PACK | Freq: Every day | ORAL | 0 refills | Status: AC | PRN

## 2022-06-05 MED ORDER — BUDESONIDE-FORMOTEROL FUMARATE 160-4.5 MCG/ACT IN AERO: 2.0000 | INHALATION_SPRAY | Freq: Two times a day (BID) | RESPIRATORY_TRACT | Status: AC

## 2022-06-05 MED ORDER — METOPROLOL SUCCINATE ER 25 MG PO TB24: 12.5000 mg | ORAL_TABLET | Freq: Every day | ORAL | 0 refills | Status: AC

## 2022-06-05 NOTE — Progress Notes (Signed)
Per MD request, f/u appt scheduled 06/13/22 with Wende Mott PA-C, appt info placed on AVS, labs can be checked at visit as requested in Dr. Ludwig Clarks note. Also has f/u with Dr. Eldridge Dace 08/2022 which we will keep on the schedule for now.

## 2022-06-05 NOTE — Progress Notes (Signed)
Rounding Note    Patient Name: [name] [name] Date of Encounter: 06/05/2022  [facility] Cardiologist: Lance Muss, MD   Subjective   Pt denies CP or dyspnea  Inpatient Medications    Scheduled Meds:  Chlorhexidine Gluconate Cloth  6 each Topical Daily   clopidogrel  75 mg Oral Daily   famotidine  20 mg Oral Daily   feeding supplement  1 Container Oral TID BM   furosemide  40 mg Oral Daily   heparin injection (subcutaneous)  5,000 Units Subcutaneous Q8H   isosorbide mononitrate  60 mg Oral Daily   losartan  25 mg Oral Daily   metoprolol succinate  12.5 mg Oral Daily   mometasone-formoterol  2 puff Inhalation BID   multivitamin with minerals  1 tablet Oral Daily   pantoprazole  40 mg Oral BID   polyethylene glycol  17 g Oral Daily   rosuvastatin  5 mg Oral Daily   Continuous Infusions:  PRN Meds: acetaminophen **OR** acetaminophen, albuterol, alum & mag hydroxide-simeth, ondansetron **OR** ondansetron (ZOFRAN) IV, traZODone   Vital Signs    Vitals:   06/04/22 1558 06/04/22 2038 06/04/22 2258 06/05/22 0540  BP: 106/68 (!) 110/57 (!) 122/57 109/65  Pulse: 75 98 82 74  Resp: 17 18 16 16   Temp: 98.4 F (36.9 C) 98.3 F (36.8 C) 98.4 F (36.9 C) 98.7 F (37.1 C)  TempSrc: Oral Oral Oral Oral  SpO2: 100% 97% 100% 98%  Weight:      Height:       No intake or output data in the 24 hours ending 06/05/22 0746    06/02/2022    7:00 AM 06/01/2022    6:00 PM 06/01/2022    7:58 AM  Last 3 Weights  Weight (lbs) 154 lb 8.7 oz 155 lb 13.8 oz 153 lb  Weight (kg) 70.1 kg 70.7 kg 69.4 kg      Telemetry    Sinus with PVCs- Personally Reviewed   Physical Exam   GEN: NAD Neck: Supple Cardiac: RRR, 2/6 systolic murmur; no DM Respiratory: Diminished BS RLL; no wheeze GI: Soft, NT/ND MS: No edema Neuro:  Grossly intact Psych: Normal affect   Labs    High Sensitivity Troponin:   Recent Labs  Lab 05/31/22 1043 05/31/22 1230  05/31/22 2141 06/01/22 0553  TROPONINIHS 190* 226* 2,696* 1,995*      Chemistry Recent Labs  Lab 05/31/22 1043 05/31/22 2149 06/01/22 1027 06/02/22 0329 06/03/22 0711 06/04/22 0749  NA 145  --    < > 143 139 140  K 3.0*  --    < > 4.9 4.3 4.1  CL 117*  --    < > 112* 110 110  CO2 18*  --    < > 24 21* 22  GLUCOSE 141*  --    < > 96 127* 124*  BUN 25*  --    < > 35* 35* 36*  CREATININE 1.33*  --    < > 1.91* 1.57* 1.47*  CALCIUM 8.1*  --    < > 8.8* 8.8* 8.6*  MG  --  2.6*  --   --   --   --   PROT 6.3*  --   --   --   --   --   ALBUMIN 3.3*  --   --   --   --   --   AST 16  --   --   --   --   --  ALT 14  --   --   --   --   --   ALKPHOS 63  --   --   --   --   --   BILITOT 0.9  --   --   --   --   --   GFRNONAA 39*  --    < > 26* 32* 35*  ANIONGAP 10  --    < > 7 8 8    < > = values in this interval not displayed.     Hematology Recent Labs  Lab 06/01/22 1027 06/02/22 0329 06/04/22 0749  WBC 11.4* 9.9 7.0  RBC 3.35* 3.37* 3.33*  HGB 10.0* 9.8* 9.7*  HCT 31.1* 32.1* 30.4*  MCV 92.8 95.3 91.3  MCH 29.9 29.1 29.1  MCHC 32.2 30.5 31.9  RDW 15.0 15.2 14.9  PLT 176 167 162     BNP Recent Labs  Lab 05/31/22 1034  BNP 4,012.1*     DDimer  Recent Labs  Lab 05/31/22 1043  DDIMER 1.82*     Patient Profile     86 y.o. female with past medical history of coronary artery disease, aortic stenosis, hypertension, PVCs, rheumatoid arthritis, chronic stage IIIb kidney disease admitted with dyspnea.  Patient had cardiac catheterization November 2023 that showed severe three-vessel coronary artery disease including 90% left main, occluded distal LAD, occluded right coronary artery.  She has been treated medically as she was felt not to be a candidate for coronary artery bypass graft and there were no good PCI options.  Echocardiogram this admission shows ejection fraction 25 to 30%, grade 2 diastolic dysfunction, moderate RV dysfunction, moderate left atrial  enlargement, moderate mitral regurgitation, moderate to severe low-flow low gradient aortic stenosis with mean gradient 9 mmHg, aortic valve area 1 cm and dimensionless index 0.29.  CTA showed no pulmonary embolus but there was note of moderate bilateral pleural effusions.  Follow-up chest x-ray December 2019 showed worsening CHF.  Patient also ruled in for non-ST elevation myocardial infarction.  Assessment & Plan    1 coronary artery disease/non-ST elevation MI-previous catheterization as outlined above shows severe three-vessel coronary artery disease including severe left main disease.  She was felt not to be a candidate for coronary artery bypass and graft and no good options for PCI.  She has therefore been treated medically.  She is not having chest pain.  Will continue Plavix (pt has allergy to ASA), isosorbide, beta-blocker and Crestor.  2 cardiomyopathy-ejection fraction severely reduced on recent echocardiogram.  Will plan medical therapy.  Continue low-dose losartan and Toprol.  Could consider adding spironolactone or SGLT2 inhibitor later as renal function/blood pressure allow.  However she has severe coronary disease and is a no CODE BLUE.  3 acute systolic congestive heart failure-volume status has improved.  Continue diuretics at present dose.  Potassium and renal function pending this morning.  4 acute on chronic stage IIIb kidney disease-follow renal function closely.  5 severe aortic stenosis-noted on follow-up echocardiogram.  Note with severity of coronary disease she is not a candidate for TAVR or other interventions.  6 no CODE BLUE  Patient is ambulating with no chest pain.  Blood pressure has been stable on low-dose ARB and beta-blocker.  If renal function and potassium are stable today she can be discharged and we will arrange outpatient follow-up in 1 week.  Check potassium and renal function at that time.  Cardiology will sign off.  Please call with questions.    For  questions or updates, please contact [facility] Please consult [email or web address] for contact info under        Signed, Olga Millers, MD  06/05/2022, 7:46 AM

## 2022-06-05 NOTE — Discharge Summary (Signed)
Physician Discharge Summary  [name] [name] [id number] DOB: 1936/02/05 DOA: 05/31/2022  PCP: Leanna Battles (Inactive)  Admit date: 05/31/2022 Discharge date: 06/05/2022  Admitted From: Home Disposition: Home  Recommendations for Outpatient Follow-up:  Follow up with PCP in 1 week with repeat CBC/BMP Outpatient follow-up with cardiology Recommend outpatient evaluation and follow-up with palliative care Follow up in ED if symptoms worsen or new appear   Home Health: No Equipment/Devices: None  Discharge Condition: Stable CODE STATUS: Full Diet recommendation: Heart healthy/fluid restriction of up to 1200 cc a day  Brief/Interim Summary:  86 y.o. female with medical history significant of aortic stenosis, osteoarthritis, CAD, ACE inhibitor induced cough, stage 3b CKD, depression, GERD, hepatitis B, hepatitis C, PACs, PVCs, hyperlipidemia, hypertensive heart disease, IBS, obesity, osteoarthritis of the knees with a history of TKR, osteoporosis, pancreatitis, pleural effusion, peptic ulcer disease, sleep apnea not on CPAP who was recently admitted and discharged by cardiology due to angina undergoing cardiac catheterization which showed severe CAD was not amenable to stent placement and the patient is not a candidate for CABG. She presented with complaints of shortness of breath and there was was concern for pulmonary edema. She was hospitalized for further management.   She was started on IV Lasix.  Cardiology was consulted.  Echo showed EF of 25 to 30%.  During the hospitalization, her condition is improved.  She has been switched to oral Lasix and diuresing well.  Cardiology has cleared the patient for discharge.  Discharge patient home today with outpatient follow-up with PCP and cardiology.  Discharge Diagnoses:   Acute on chronic combined systolic and diastolic CHF Elevated troponin/non-STEMI CAD (severe left main and three-vessel disease without targets for CABG or  PCI) Moderate to severe aortic stenosis Acute respiratory failure with hypoxia -No PE on CTA chest but bilateral pleural effusions -Echo during this admission showed EF of 25 to 30% with global hypokinesis (prior EF of 65 to 70% in June 2023) and grade 2 diastolic dysfunction along with moderate to severe aortic stenosis -IV Lasix had to be held but subsequently restarted because of worsening chest x-ray. -Cardiology following.  Continue Imdur, metoprolol, statin, Plavix.  ARB has been resumed by cardiology. -Patient has completed 48 hours of heparin and nitroglycerin infusions -Overall prognosis is poor.  Palliative care consultation is pending.  This can happen as an outpatient -Currently on room air. -IV Lasix has been switched to oral Lasix by cardiology and cardiology has cleared the patient for discharge.  Discharge patient home today on oral Lasix, Toprol-XL, ARB and Imdur.  Outpatient follow-up with cardiology and PCP.   Acute on chronic kidney disease stage IIIb Metabolic acidosis: Mild.  Monitor -Creatinine peaked to 1.91.  Improving.  Creatinine 1.48 today.  Outpatient follow-up.  Diuretics as above   Anemia of chronic disease -Hemoglobin stable.  No signs of bleeding.  Monitor intermittently as an outpatient   Leukocytosis -Resolved   Hyperlipidemia -Continue statin   Essential hypertension -Continue antihypertensives as above.     GERD -Continue famotidine and Protonix   Sleep apnea -Stable   Moderate malnutrition -Follow nutrition recommendations  Obesity -Outpatient follow-up  Discharge Instructions  Discharge Instructions     Amb Referral to Palliative Care   Complete by: As directed    Diet - low sodium heart healthy   Complete by: As directed    Increase activity slowly   Complete by: As directed       Allergies as of 06/05/2022  Reactions   Gabapentin Other (See Comments)   Mental confusion   Aspirin Nausea Only   Clarithromycin Other  (See Comments)   unknown   Doxycycline Nausea Only   Levaquin [levofloxacin In D5w] Rash, Other (See Comments)   Headaches   Penicillins Itching, Rash   Other reaction(s): Unknown Has patient had a PCN reaction causing immediate rash, facial/tongue/throat swelling, SOB or lightheadedness with hypotension: No Has patient had a PCN reaction causing severe rash involving mucus membranes or skin necrosis: No Has patient had a PCN reaction that required hospitalization No Has patient had a PCN reaction occurring within the last 10 years: No If all of the above answers are "NO", then may proceed with Cephalosporin use.   Pravastatin Itching   REACTION: itching        Medication List     STOP taking these medications    metoprolol tartrate 25 MG tablet Commonly known as: LOPRESSOR   traZODone 50 MG tablet Commonly known as: DESYREL       TAKE these medications    acetaminophen 500 MG tablet Commonly known as: TYLENOL Take 1,000 mg by mouth every 6 (six) hours as needed for moderate pain or headache.   albuterol (2.5 MG/3ML) 0.083% nebulizer solution Commonly known as: PROVENTIL Take 3 mLs (2.5 mg total) by nebulization every 6 (six) hours as needed for wheezing or shortness of breath.   budesonide-formoterol 160-4.5 MCG/ACT inhaler Commonly known as: Symbicort Inhale 2 puffs into the lungs 2 (two) times daily. What changed:  when to take this reasons to take this   clopidogrel 75 MG tablet Commonly known as: PLAVIX Take 1 tablet (75 mg total) by mouth daily.   famotidine 20 MG tablet Commonly known as: PEPCID Take 1 tablet (20 mg total) by mouth daily.   furosemide 20 MG tablet Commonly known as: LASIX Take 2 tablets (40 mg total) by mouth daily. What changed:  how much to take additional instructions   isosorbide mononitrate 60 MG 24 hr tablet Commonly known as: IMDUR Take 1 tablet (60 mg total) by mouth daily.   metoCLOPramide 10 MG tablet Commonly  known as: REGLAN Take 10 mg by mouth at bedtime.   metoprolol succinate 25 MG 24 hr tablet Commonly known as: TOPROL-XL Take 0.5 tablets (12.5 mg total) by mouth daily. Start taking on: June 06, 2022   pantoprazole 40 MG tablet Commonly known as: PROTONIX Take 1 tablet (40 mg total) by mouth 2 (two) times daily.   polyethylene glycol 17 g packet Commonly known as: MIRALAX / GLYCOLAX Take 17 g by mouth daily as needed for mild constipation.   rosuvastatin 5 MG tablet Commonly known as: CRESTOR Take 5 mg by mouth daily.   telmisartan 80 MG tablet Commonly known as: MICARDIS Take 0.5 tablets (40 mg total) by mouth daily.   Vitamin D (Ergocalciferol) 1.25 MG (50000 UNIT) Caps capsule Commonly known as: DRISDOL Take 50,000 Units by mouth every Sunday.        Follow-up Information     Richardson Dopp T, PA-C Follow up.   Specialties: Cardiology, Physician Assistant Why: [place] location - cardiology follow-up arranged on Monday Jun 13, 2022 at 1:55 PM (Arrive by 1:40 PM). Contact information: [id number] [place] [address] [phone]         Leanna Battles. Schedule an appointment as soon as possible for a visit in 1 week(s).   Specialty: Surgery Why: with repeat bmp Contact information:  [place]                Allergies  Allergen Reactions   Gabapentin Other (See Comments)    Mental confusion   Aspirin Nausea Only   Clarithromycin Other (See Comments)    unknown   Doxycycline Nausea Only   Levaquin [Levofloxacin In D5w] Rash and Other (See Comments)    Headaches   Penicillins Itching and Rash    Other reaction(s): Unknown Has patient had a PCN reaction causing immediate rash, facial/tongue/throat swelling, SOB or lightheadedness with hypotension: No Has patient had a PCN reaction causing severe rash involving mucus membranes or skin necrosis: No Has patient had a PCN reaction that required hospitalization  No Has patient had a PCN reaction occurring within the last 10 years: No If all of the above answers are "NO", then may proceed with Cephalosporin use.    Pravastatin Itching    REACTION: itching    Consultations: Cardiology   Procedures/Studies: DG CHEST PORT 1 VIEW  Result Date: 06/03/2022 CLINICAL DATA:  Pulmonary edema EXAM: PORTABLE CHEST 1 VIEW COMPARISON:  Portable exam 0437 hours compared to 05/31/2022 FINDINGS: Enlargement of cardiac silhouette with pulmonary vascular congestion. Atherosclerotic calcification aorta. Diffuse BILATERAL pulmonary infiltrates consistent with pulmonary edema and CHF, slightly increased from previous exam. No definite pleural effusion or pneumothorax. Bones demineralized. IMPRESSION: Increased CHF versus previous study. Aortic Atherosclerosis (ICD10-I70.0). Electronically Signed   By: Lavonia Dana M.D.   On: 06/03/2022 07:43   ECHOCARDIOGRAM COMPLETE  Result Date: 06/01/2022    ECHOCARDIOGRAM REPORT   Patient Name:   [name] [name] Date of Exam: 06/01/2022 Medical Rec #:  [id number]             Height:       60.0 in Accession #:    [id number]            Weight:       153.0 lb Date of Birth:  December 20, 1935              BSA:          1.666 m Patient Age:    33 years              BP:           111/76 mmHg Patient Gender: F                     HR:           76 bpm. Exam Location:  Inpatient Procedure: 2D Echo and Intracardiac Opacification Agent Indications:    CHF  History:        Patient has prior history of Echocardiogram examinations, most                 recent 11/26/2021. CAD; Risk Factors:Hypertension and                 Dyslipidemia.  Sonographer:    Harvie Junior Referring Phys: [phone] [facility]  Sonographer Comments: Technically difficult study due to poor echo windows. Image acquisition challenging due to respiratory motion. IMPRESSIONS  1. Left ventricular ejection fraction, by estimation, is 25 to 30%. The left ventricle has severely decreased  function. There is global hypokinesis with akinesis of the inferior and all septal LV segments concerning for multivessel CAD. Left ventricular  diastolic parameters are consistent with Grade II diastolic dysfunction (pseudonormalization). Elevated left atrial pressure.  2. Right ventricular systolic function is moderately reduced. The right ventricular  size is not well visualized. There is mildly elevated pulmonary artery systolic pressure.  3. Left atrial size was moderately dilated.  4. The mitral valve is degenerative. There is tethering of the posterior mitral valve leaflet with resultant moderate mitral valve regurgitation. Moderate mitral annular calcification.  5. Moderate-to-severe low flow, low gradient aortic stenosis with mean graident 46mmHg, Vmax 61m/s, AVA 1.0, DI 0.29. The aortic valve is calcified. There is severe calcifcation of the aortic valve. There is severe thickening of the aortic valve. Aortic valve regurgitation is not visualized. Moderate to severe aortic valve stenosis.  6. The inferior vena cava is dilated in size with <50% respiratory variability, suggesting right atrial pressure of 15 mmHg. Comparison(s): Compared to prior TTE on 11/2021, the LVEF has dropped significantly from 65-70% to 25-30% with wall motion abnormalities as described above. FINDINGS  Left Ventricle: There is global hypokinesis with akinesis of the inferior and all septal LV segments concerning for multivessel CAD. Left ventricular ejection fraction, by estimation, is 25 to 30%. The left ventricle has severely decreased function. Definity contrast agent was given IV to delineate the left ventricular endocardial borders. The left ventricular internal cavity size was mildly dilated. There is no left ventricular hypertrophy. Left ventricular diastolic parameters are consistent with Grade II diastolic dysfunction (pseudonormalization). Elevated left atrial pressure. Right Ventricle: The right ventricular size is not well  visualized. Right vetricular wall thickness was not well visualized. Right ventricular systolic function is moderately reduced. There is mildly elevated pulmonary artery systolic pressure. The tricuspid regurgitant velocity is 2.89 m/s, and with an assumed right atrial pressure of 3 mmHg, the estimated right ventricular systolic pressure is [id number] mmHg. Left Atrium: Left atrial size was moderately dilated. Right Atrium: Right atrial size was normal in size. Pericardium: There is no evidence of pericardial effusion. Mitral Valve: The mitral valve is degenerative in appearance. There is moderate thickening of the mitral valve leaflet(s). There is mild calcification of the mitral valve leaflet(s). Moderate mitral annular calcification. There is tethering of the posterior mitral valve leaflet with resultant moderate mitral valve regurgitation. MV peak gradient, 6.2 mmHg. The mean mitral valve gradient is 2.0 mmHg. Tricuspid Valve: The tricuspid valve is grossly normal. Tricuspid valve regurgitation is trivial. Aortic Valve: Moderate-to-severe low flow, low gradient aortic stenosis with mean graident 28mmHg, Vmax 39m/s, AVA 1.0, DI 0.29. The aortic valve is calcified. There is severe calcifcation of the aortic valve. There is severe thickening of the aortic valve. Aortic valve regurgitation is not visualized. Moderate to severe aortic stenosis is present. Aortic valve mean gradient measures 9.0 mmHg. Aortic valve peak gradient measures 15.1 mmHg. Aortic valve area, by VTI measures 0.92 cm. Pulmonic Valve: The pulmonic valve was not well visualized. Pulmonic valve regurgitation is trivial. Aorta: The aortic root is normal in size and structure. Venous: The inferior vena cava is dilated in size with less than 50% respiratory variability, suggesting right atrial pressure of 15 mmHg. IAS/Shunts: The atrial septum is grossly normal.  LEFT VENTRICLE PLAX 2D LVIDd:         4.70 cm      Diastology LVIDs:         4.10 cm      LV e'  medial:    5.33 cm/s LV PW:         1.10 cm      LV E/e' medial:  22.1 LV IVS:        1.10 cm      LV e' lateral:  6.64 cm/s LVOT diam:     1.90 cm      LV E/e' lateral: 17.8 LV SV:         35 LV SV Index:   21 LVOT Area:     2.84 cm  LV Volumes (MOD) LV vol d, MOD A2C: 173.0 ml LV vol d, MOD A4C: 127.0 ml LV vol s, MOD A2C: 131.0 ml LV vol s, MOD A4C: 89.6 ml LV SV MOD A2C:     42.0 ml LV SV MOD A4C:     127.0 ml LV SV MOD BP:      42.7 ml RIGHT VENTRICLE RV Basal diam:  3.80 cm RV Mid diam:    3.40 cm RV S prime:     7.94 cm/s TAPSE (M-mode): 1.6 cm LEFT ATRIUM             Index        RIGHT ATRIUM           Index LA diam:        3.30 cm 1.98 cm/m   RA Area:     12.80 cm LA Vol (A2C):   56.2 ml 33.74 ml/m  RA Volume:   29.30 ml  17.59 ml/m LA Vol (A4C):   70.9 ml 42.56 ml/m LA Biplane Vol: 66.2 ml 39.74 ml/m  AORTIC VALVE                     PULMONIC VALVE AV Area (Vmax):    0.91 cm      PV Vmax:       1.28 m/s AV Area (Vmean):   0.91 cm      PV Peak grad:  6.6 mmHg AV Area (VTI):     0.92 cm AV Vmax:           194.00 cm/s AV Vmean:          137.500 cm/s AV VTI:            0.381 m AV Peak Grad:      15.1 mmHg AV Mean Grad:      9.0 mmHg LVOT Vmax:         62.10 cm/s LVOT Vmean:        43.900 cm/s LVOT VTI:          0.123 m LVOT/AV VTI ratio: 0.32  AORTA Ao Root diam: 3.10 cm Ao Asc diam:  2.90 cm MITRAL VALVE                  TRICUSPID VALVE MV Area (PHT): 4.63 cm       TR Peak grad:   33.4 mmHg MV Area VTI:   1.11 cm       TR Vmax:        289.00 cm/s MV Peak grad:  6.2 mmHg MV Mean grad:  2.0 mmHg       SHUNTS MV Vmax:       1.25 m/s       Systemic VTI:  0.12 m MV Vmean:      60.4 cm/s      Systemic Diam: 1.90 cm MV Decel Time: 164 msec MR Peak grad:    73.4 mmHg MR Mean grad:    47.0 mmHg MR Vmax:         428.50 cm/s MR Vmean:        329.0 cm/s MR PISA:         3.08 cm MR PISA Eff ROA:  20 mm MR PISA Radius:  0.70 cm MV E velocity: 118.00 cm/s MV A velocity: 82.50 cm/s MV E/A ratio:  1.43 Laurance Flatten MD Electronically signed by Laurance Flatten MD Signature Date/Time: 06/01/2022/5:19:45 PM    Final    CT Angio Chest PE W and/or Wo Contrast  Result Date: 05/31/2022 CLINICAL DATA:  COPD, short of breath for 2 days EXAM: CT ANGIOGRAPHY CHEST WITH CONTRAST TECHNIQUE: Multidetector CT imaging of the chest was performed using the standard protocol during bolus administration of intravenous contrast. Multiplanar CT image reconstructions and MIPs were obtained to evaluate the vascular anatomy. RADIATION DOSE REDUCTION: This exam was performed according to the departmental dose-optimization program which includes automated exposure control, adjustment of the mA and/or kV according to patient size and/or use of iterative reconstruction technique. CONTRAST:  37mL OMNIPAQUE IOHEXOL 350 MG/ML SOLN COMPARISON:  11/04/2009, 05/31/2022 FINDINGS: Cardiovascular: This is a technically adequate evaluation of the pulmonary vasculature. No filling defects or pulmonary emboli. The heart is enlarged. Dilated main pulmonary artery measuring up to 3.4 cm may reflect pulmonary arterial hypertension. There is prominent left ventricular dilatation. Calcification of the mitral and aortic valves. Extensive atherosclerosis throughout the coronary vasculature. No pericardial effusion. Normal caliber of the thoracic aorta. Diffuse aortic atherosclerosis. Evaluation of the aortic lumen is limited due to timing of contrast bolus. Mediastinum/Nodes: No enlarged mediastinal, hilar, or axillary lymph nodes. Thyroid gland, trachea, and esophagus demonstrate no significant findings. Lungs/Pleura: Moderate bilateral free-flowing pleural effusions, less than 1 L each. Dependent consolidation within the bilateral lower lobes, left greater than right, favor atelectasis. No pneumothorax. Central airways are patent. Upper Abdomen: No acute abnormality.  Small hiatal hernia. Musculoskeletal: No acute or destructive bony lesions. Reconstructed  images demonstrate no additional findings. Review of the MIP images confirms the above findings. IMPRESSION: 1. No evidence of pulmonary embolus. 2. Cardiomegaly.  Prominent left ventricular dilatation. 3. Dilated main pulmonary artery consistent with pulmonary arterial hypertension. 4. Moderate bilateral free-flowing pleural effusions. Dependent lower lobe consolidation favor compressive atelectasis. 5. Aortic Atherosclerosis (ICD10-I70.0). Coronary artery atherosclerosis. Electronically Signed   By: Sharlet Salina M.D.   On: 05/31/2022 16:46   DG Chest Port 1 View  Result Date: 05/31/2022 CLINICAL DATA:  Dyspnea. EXAM: PORTABLE CHEST 1 VIEW COMPARISON:  Radiographs 11/25/2021 and 05/29/2020.  CT 11/04/2009. FINDINGS: 1049 hours. The heart is enlarged and there is aortic atherosclerosis. The pulmonary vasculature is ill-defined, suspicious for mild edema. There may be small bilateral pleural effusions. No confluent airspace opacity or pneumothorax. No acute osseous findings are evident. Telemetry leads overlie the chest. IMPRESSION: Cardiomegaly with probable mild pulmonary edema and small bilateral pleural effusions. Findings suggest mild congestive heart failure. Electronically Signed   By: Carey Bullocks M.D.   On: 05/31/2022 10:56      Subjective: Patient seen and examined at bedside.  Feels better and wants to go home today.  Currently on room air.  Denies worsening shortness of breath or chest pain.  Discharge Exam: Vitals:   06/05/22 0540 06/05/22 0911  BP: 109/65   Pulse: 74   Resp: 16   Temp: 98.7 F (37.1 C)   SpO2: 98% 99%    General: Pt is alert, awake, not in acute distress.  Elderly female sitting on bed.  Looks chronically ill and deconditioned. Cardiovascular: rate controlled, S1/S2 + Respiratory: bilateral decreased breath sounds at bases with basilar crackles Abdominal: Soft, obese, NT, ND, bowel sounds + Extremities: Trace lower extremity edema; no cyanosis    The  results of significant diagnostics from this hospitalization (including imaging, microbiology, ancillary and laboratory) are listed below for reference.     Microbiology: Recent Results (from the past 240 hour(s))  Resp panel by RT-PCR (RSV, Flu A&B, Covid) Anterior Nasal Swab     Status: None   Collection Time: 05/31/22 10:34 AM   Specimen: Anterior Nasal Swab  Result Value Ref Range Status   SARS Coronavirus 2 by RT PCR NEGATIVE NEGATIVE Final    Comment: (NOTE) SARS-CoV-2 target nucleic acids are NOT DETECTED.  The SARS-CoV-2 RNA is generally detectable in upper respiratory specimens during the acute phase of infection. The lowest concentration of SARS-CoV-2 viral copies this assay can detect is 138 copies/mL. A negative result does not preclude SARS-Cov-2 infection and should not be used as the sole basis for treatment or other patient management decisions. A negative result may occur with  improper specimen collection/handling, submission of specimen other than nasopharyngeal swab, presence of viral mutation(s) within the areas targeted by this assay, and inadequate number of viral copies(<138 copies/mL). A negative result must be combined with clinical observations, patient history, and epidemiological information. The expected result is Negative.  Fact Sheet for Patients:  EntrepreneurPulse.com.au  Fact Sheet for Healthcare Providers:  IncredibleEmployment.be  This test is no t yet approved or cleared by the [place] FDA and  has been authorized for detection and/or diagnosis of SARS-CoV-2 by FDA under an Emergency Use Authorization (EUA). This EUA will remain  in effect (meaning this test can be used) for the duration of the COVID-19 declaration under Section 564(b)(1) of the Act, 21 U.S.C.section 360bbb-3(b)(1), unless the authorization is terminated  or revoked sooner.       Influenza A by PCR NEGATIVE NEGATIVE Final    Influenza B by PCR NEGATIVE NEGATIVE Final    Comment: (NOTE) The Xpert Xpress SARS-CoV-2/FLU/RSV plus assay is intended as an aid in the diagnosis of influenza from Nasopharyngeal swab specimens and should not be used as a sole basis for treatment. Nasal washings and aspirates are unacceptable for Xpert Xpress SARS-CoV-2/FLU/RSV testing.  Fact Sheet for Patients: EntrepreneurPulse.com.au  Fact Sheet for Healthcare Providers: IncredibleEmployment.be  This test is not yet approved or cleared by the [place] FDA and has been authorized for detection and/or diagnosis of SARS-CoV-2 by FDA under an Emergency Use Authorization (EUA). This EUA will remain in effect (meaning this test can be used) for the duration of the COVID-19 declaration under Section 564(b)(1) of the Act, 21 U.S.C. section 360bbb-3(b)(1), unless the authorization is terminated or revoked.     Resp Syncytial Virus by PCR NEGATIVE NEGATIVE Final    Comment: (NOTE) Fact Sheet for Patients: EntrepreneurPulse.com.au  Fact Sheet for Healthcare Providers: IncredibleEmployment.be  This test is not yet approved or cleared by the [place] FDA and has been authorized for detection and/or diagnosis of SARS-CoV-2 by FDA under an Emergency Use Authorization (EUA). This EUA will remain in effect (meaning this test can be used) for the duration of the COVID-19 declaration under Section 564(b)(1) of the Act, 21 U.S.C. section 360bbb-3(b)(1), unless the authorization is terminated or revoked.  Performed at [facility], [place] [address], [place], [place] [address]   MRSA Next Gen by PCR, Nasal     Status: None   Collection Time: 06/01/22  6:38 PM   Specimen: Nasal Mucosa; Nasal Swab  Result Value Ref Range Status   MRSA by PCR Next Gen NOT DETECTED NOT DETECTED Final    Comment: (NOTE)  The GeneXpert MRSA Assay (FDA approved for  NASAL specimens only), is one component of a comprehensive MRSA colonization surveillance program. It is not intended to diagnose MRSA infection nor to guide or monitor treatment for MRSA infections. Test performance is not FDA approved in patients less than 36 years old. Performed at [facility], [place] [address] [place], [place] [address]      Labs: BNP (last 3 results) Recent Labs    05/31/22 1034  BNP [id number]*   Basic Metabolic Panel: Recent Labs  Lab 05/31/22 2149 06/01/22 1027 06/02/22 0329 06/03/22 0711 06/04/22 0749 06/05/22 0801  NA  --  142 143 139 140 142  K  --  4.4 4.9 4.3 4.1 4.5  CL  --  112* 112* 110 110 109  CO2  --  22 24 21* 22 25  GLUCOSE  --  124* 96 127* 124* 156*  BUN  --  38* 35* 35* 36* 33*  CREATININE  --  1.70* 1.91* 1.57* 1.47* 1.48*  CALCIUM  --  8.9 8.8* 8.8* 8.6* 8.8*  MG 2.6*  --   --   --   --  2.3   Liver Function Tests: Recent Labs  Lab 05/31/22 1043  AST 16  ALT 14  ALKPHOS 63  BILITOT 0.9  PROT 6.3*  ALBUMIN 3.3*   No results for input(s): "LIPASE", "AMYLASE" in the last 168 hours. No results for input(s): "AMMONIA" in the last 168 hours. CBC: Recent Labs  Lab 05/31/22 1043 06/01/22 1027 06/02/22 0329 06/04/22 0749  WBC 6.7 11.4* 9.9 7.0  HGB 10.7* 10.0* 9.8* 9.7*  HCT 33.7* 31.1* 32.1* 30.4*  MCV 92.3 92.8 95.3 91.3  PLT 181 176 167 162   Cardiac Enzymes: No results for input(s): "CKTOTAL", "CKMB", "CKMBINDEX", "TROPONINI" in the last 168 hours. BNP: Invalid input(s): "POCBNP" CBG: No results for input(s): "GLUCAP" in the last 168 hours. D-Dimer No results for input(s): "DDIMER" in the last 72 hours. Hgb A1c No results for input(s): "HGBA1C" in the last 72 hours. Lipid Profile No results for input(s): "CHOL", "HDL", "LDLCALC", "TRIG", "CHOLHDL", "LDLDIRECT" in the last 72 hours. Thyroid function studies No results for input(s): "TSH", "T4TOTAL", "T3FREE", "THYROIDAB" in the last 72  hours.  Invalid input(s): "FREET3" Anemia work up No results for input(s): "VITAMINB12", "FOLATE", "FERRITIN", "TIBC", "IRON", "RETICCTPCT" in the last 72 hours. Urinalysis    Component Value Date/Time   COLORURINE STRAW (A) 09/29/2020 0029   APPEARANCEUR CLEAR 09/29/2020 0029   LABSPEC 1.011 09/29/2020 0029   PHURINE 7.0 09/29/2020 0029   GLUCOSEU NEGATIVE 09/29/2020 0029   GLUCOSEU NEGATIVE 08/23/2006 1105   HGBUR NEGATIVE 09/29/2020 0029   BILIRUBINUR NEGATIVE 09/29/2020 0029   BILIRUBINUR negative 04/25/2015 1636   KETONESUR NEGATIVE 09/29/2020 0029   PROTEINUR NEGATIVE 09/29/2020 0029   UROBILINOGEN 0.2 04/25/2015 1636   UROBILINOGEN 0.2 04/20/2014 0616   NITRITE NEGATIVE 09/29/2020 0029   LEUKOCYTESUR NEGATIVE 09/29/2020 0029   Sepsis Labs Recent Labs  Lab 05/31/22 1043 06/01/22 1027 06/02/22 0329 06/04/22 0749  WBC 6.7 11.4* 9.9 7.0   Microbiology Recent Results (from the past 240 hour(s))  Resp panel by RT-PCR (RSV, Flu A&B, Covid) Anterior Nasal Swab     Status: None   Collection Time: 05/31/22 10:34 AM   Specimen: Anterior Nasal Swab  Result Value Ref Range Status   SARS Coronavirus 2 by RT PCR NEGATIVE NEGATIVE Final    Comment: (NOTE) SARS-CoV-2 target nucleic acids are NOT DETECTED.  The SARS-CoV-2  RNA is generally detectable in upper respiratory specimens during the acute phase of infection. The lowest concentration of SARS-CoV-2 viral copies this assay can detect is 138 copies/mL. A negative result does not preclude SARS-Cov-2 infection and should not be used as the sole basis for treatment or other patient management decisions. A negative result may occur with  improper specimen collection/handling, submission of specimen other than nasopharyngeal swab, presence of viral mutation(s) within the areas targeted by this assay, and inadequate number of viral copies(<138 copies/mL). A negative result must be combined with clinical observations, patient  history, and epidemiological information. The expected result is Negative.  Fact Sheet for Patients:  EntrepreneurPulse.com.au  Fact Sheet for Healthcare Providers:  IncredibleEmployment.be  This test is no t yet approved or cleared by the [place] FDA and  has been authorized for detection and/or diagnosis of SARS-CoV-2 by FDA under an Emergency Use Authorization (EUA). This EUA will remain  in effect (meaning this test can be used) for the duration of the COVID-19 declaration under Section 564(b)(1) of the Act, 21 U.S.C.section 360bbb-3(b)(1), unless the authorization is terminated  or revoked sooner.       Influenza A by PCR NEGATIVE NEGATIVE Final   Influenza B by PCR NEGATIVE NEGATIVE Final    Comment: (NOTE) The Xpert Xpress SARS-CoV-2/FLU/RSV plus assay is intended as an aid in the diagnosis of influenza from Nasopharyngeal swab specimens and should not be used as a sole basis for treatment. Nasal washings and aspirates are unacceptable for Xpert Xpress SARS-CoV-2/FLU/RSV testing.  Fact Sheet for Patients: EntrepreneurPulse.com.au  Fact Sheet for Healthcare Providers: IncredibleEmployment.be  This test is not yet approved or cleared by the [place] FDA and has been authorized for detection and/or diagnosis of SARS-CoV-2 by FDA under an Emergency Use Authorization (EUA). This EUA will remain in effect (meaning this test can be used) for the duration of the COVID-19 declaration under Section 564(b)(1) of the Act, 21 U.S.C. section 360bbb-3(b)(1), unless the authorization is terminated or revoked.     Resp Syncytial Virus by PCR NEGATIVE NEGATIVE Final    Comment: (NOTE) Fact Sheet for Patients: EntrepreneurPulse.com.au  Fact Sheet for Healthcare Providers: IncredibleEmployment.be  This test is not yet approved or cleared by the [place] FDA  and has been authorized for detection and/or diagnosis of SARS-CoV-2 by FDA under an Emergency Use Authorization (EUA). This EUA will remain in effect (meaning this test can be used) for the duration of the COVID-19 declaration under Section 564(b)(1) of the Act, 21 U.S.C. section 360bbb-3(b)(1), unless the authorization is terminated or revoked.  Performed at [facility], [place] [address], [place], [place] [address]   MRSA Next Gen by PCR, Nasal     Status: None   Collection Time: 06/01/22  6:38 PM   Specimen: Nasal Mucosa; Nasal Swab  Result Value Ref Range Status   MRSA by PCR Next Gen NOT DETECTED NOT DETECTED Final    Comment: (NOTE) The GeneXpert MRSA Assay (FDA approved for NASAL specimens only), is one component of a comprehensive MRSA colonization surveillance program. It is not intended to diagnose MRSA infection nor to guide or monitor treatment for MRSA infections. Test performance is not FDA approved in patients less than 75 years old. Performed at [facility], [place] [address], [place], [place] [address]      Time coordinating discharge: 35 minutes  SIGNED:   Aline August, MD  [facility] 06/05/2022, 10:52 AM

## 2022-06-07 ENCOUNTER — Ambulatory Visit: Payer: Self-pay

## 2022-06-07 DIAGNOSIS — I5042 Chronic combined systolic (congestive) and diastolic (congestive) heart failure: Secondary | ICD-10-CM | POA: Diagnosis not present

## 2022-06-07 NOTE — Patient Outreach (Signed)
  Care Coordination   Follow Up Visit Note   06/07/2022 Name: [name] [name] MRN: [id number] DOB: 19-Mar-1936  [name] [name] is a 87 y.o. year old female who sees [name] [name] (Inactive) for primary care. I  spoke with daughter in law [name] [name] per pt request   What matters to the patients health and wellness today?  States pt is weak from being in the hospital.  States she is having some dizziness.  States her B/P was 103/79 and O2 sat was 96 this morning.  States her weight was 148. States she is taking her medications as ordered from the hospital.  States she has called Dr. Shon Baton office to make an appointment.  States that [facility] has already called them this morning.  States they would like to know more about Palliative care and Hospice    Goals Addressed             This Visit's Progress    Community resources post hospitalization       Care Coordination Interventions: Advised patient to pace activity and to progress activity slowly Provided education to patient re: reinforced to follow heart health low salt diet Reviewed scheduled/upcoming provider appointments including cardiology 06/13/22 Patient has [facility]'s contact number and know how to get in contact with them. Reinforced to call them as needed  Reviewed Importance of taking all medications as prescribed Discussed that Palliative care has been referred and to expect a call and given number of Palliative care to call if they do not hear from them.  Reviewed differences between Palliative care and Hospice Reinforced importance of daily weights reinforced worsening signs/symptoms and when to call the doctor Reviewed to try to add more foods with protein and nutritional supplements to help prevent further weight loss from muscle loss         SDOH assessments and interventions completed:  No     Care Coordination Interventions:  Yes, provided   Follow up plan: Follow up call scheduled for 06/16/22     Encounter Outcome:  Pt. Visit Completed  Peter Garter RN, [facility], [place] Management [phone]

## 2022-06-07 NOTE — Patient Instructions (Signed)
Visit Information  Thank you for taking time to visit with me today. Please don't hesitate to contact me if I can be of assistance to you.   Following are the goals we discussed today:   Goals Addressed             This Visit's Progress    Community resources post hospitalization       Care Coordination Interventions: Advised patient to pace activity and to progress activity slowly Provided education to patient re: reinforced to follow heart health low salt diet Reviewed scheduled/upcoming provider appointments including cardiology 06/13/22 Patient has [facility]'s contact number and know how to get in contact with them. Reinforced to call them as needed  Reviewed Importance of taking all medications as prescribed Discussed that Palliative care has been referred and to expect a call and given number of Palliative care to call if they do not hear from them.  Reviewed differences between Palliative care and Hospice Reinforced importance of daily weights reinforced worsening signs/symptoms and when to call the doctor Reviewed to try to add more foods with protein and nutritional supplements to help prevent further weight loss from muscle loss         Our next appointment is by telephone on 06/16/22 at 10 AM  Please call the care guide team at [phone] if you need to cancel or reschedule your appointment.   If you are experiencing a Mental Health or [place] or need someone to talk to, please call the Suicide and Crisis Lifeline: 988 call the [place] National Suicide Prevention Lifeline: 620-197-9468 or TTY: (819)180-3871 TTY (478) 711-8794) to talk to a trained counselor call 1-800-273-TALK (toll free, 24 hour hotline) go to [facility] [address] [place] [phone]) call 911   Patient verbalizes understanding of instructions and care plan provided today and agrees to view in [place]. Active MyChart status and patient  understanding of how to access instructions and care plan via MyChart confirmed with patient.     Telephone follow up appointment with care management team member scheduled for:06/16/22  Peter Garter RN, [facility], [place] Management Coordinator [place] Management [phone]

## 2022-06-09 DIAGNOSIS — Z7189 Other specified counseling: Secondary | ICD-10-CM | POA: Diagnosis not present

## 2022-06-09 DIAGNOSIS — N179 Acute kidney failure, unspecified: Secondary | ICD-10-CM | POA: Diagnosis not present

## 2022-06-09 DIAGNOSIS — J9601 Acute respiratory failure with hypoxia: Secondary | ICD-10-CM | POA: Diagnosis not present

## 2022-06-09 DIAGNOSIS — I130 Hypertensive heart and chronic kidney disease with heart failure and stage 1 through stage 4 chronic kidney disease, or unspecified chronic kidney disease: Secondary | ICD-10-CM | POA: Diagnosis not present

## 2022-06-09 DIAGNOSIS — I5022 Chronic systolic (congestive) heart failure: Secondary | ICD-10-CM | POA: Diagnosis not present

## 2022-06-09 DIAGNOSIS — I700 Atherosclerosis of aorta: Secondary | ICD-10-CM | POA: Diagnosis not present

## 2022-06-09 DIAGNOSIS — E785 Hyperlipidemia, unspecified: Secondary | ICD-10-CM | POA: Diagnosis not present

## 2022-06-09 DIAGNOSIS — K219 Gastro-esophageal reflux disease without esophagitis: Secondary | ICD-10-CM | POA: Diagnosis not present

## 2022-06-09 DIAGNOSIS — I5041 Acute combined systolic (congestive) and diastolic (congestive) heart failure: Secondary | ICD-10-CM | POA: Diagnosis not present

## 2022-06-09 DIAGNOSIS — I350 Nonrheumatic aortic (valve) stenosis: Secondary | ICD-10-CM | POA: Diagnosis not present

## 2022-06-09 DIAGNOSIS — D638 Anemia in other chronic diseases classified elsewhere: Secondary | ICD-10-CM | POA: Diagnosis not present

## 2022-06-09 DIAGNOSIS — I214 Non-ST elevation (NSTEMI) myocardial infarction: Secondary | ICD-10-CM | POA: Diagnosis not present

## 2022-06-09 DIAGNOSIS — I2510 Atherosclerotic heart disease of native coronary artery without angina pectoris: Secondary | ICD-10-CM | POA: Diagnosis not present

## 2022-06-09 DIAGNOSIS — N1832 Chronic kidney disease, stage 3b: Secondary | ICD-10-CM | POA: Diagnosis not present

## 2022-06-10 DIAGNOSIS — I499 Cardiac arrhythmia, unspecified: Secondary | ICD-10-CM | POA: Diagnosis not present

## 2022-06-10 DIAGNOSIS — R55 Syncope and collapse: Secondary | ICD-10-CM | POA: Diagnosis not present

## 2022-06-10 DIAGNOSIS — R1084 Generalized abdominal pain: Secondary | ICD-10-CM | POA: Diagnosis not present

## 2022-06-10 DIAGNOSIS — I469 Cardiac arrest, cause unspecified: Secondary | ICD-10-CM | POA: Diagnosis not present

## 2022-06-13 ENCOUNTER — Ambulatory Visit: Payer: PPO | Admitting: Physician Assistant

## 2022-06-13 NOTE — Progress Notes (Deleted)
Cardiology Office Note:    Date:  06/13/2022   ID:  [name] [name], DOB 1935/09/28, MRN [id number]  PCP:  Jarome Matin (Inactive)  [facility] Providers Cardiologist:  Lance Muss, MD { Click to update primary MD,subspecialty MD or APP then REFRESH:1}  *** Referring MD: No ref. provider found   Chief Complaint:  No chief complaint on file. {Click here for Visit Info    :1}   Patient Profile: DNR Coronary artery disease  Cath 2009: pRCA w CTO; dLM 20, mRI 60-70 (small), mOM1 50, mLAD 60, 70, dLAD sub-total occlusion; o/w mod non-obs dz No targets for PCI or CABG >> Med Rx Myoview in 07/16/11: inf ischemia / Myoview in 02/09/15: inf ischemia, EF 50 LHC 04/26/2022: severe 3v CAD - Mid-distal LM 90; LAD proximal 90, distal 100; LCx ostial/proximal 75; OM1 70; RCA mid 100; PCWP 17>>no targets for PCI, med Rx; palliative care Aortic stenosis Mod to severe low flow low gradient AS by TTE 06/01/22 Mitral regurgitation  (HFrEF) heart failure with reduced ejection fraction TTE 06/01/2022: EF 25-30, global HK with inferior and septal HK, GR 2 DD, moderately reduced RVSF, mildly elevated PASP, moderate LAE, moderate MR, moderate to severe low-flow, low gradient aortic stenosis (mean 9, V-max 2 m/s, DI 0.29), RAP 15 Hypertension  Chronic kidney disease  PUD Hx of HBV, HCV GERD PVCs, PACs Chronic cough, ACEi stopped, ?related to GERD Possible asthma or upper airways cough syndrome  Hyperlipidemia  Intol of statins - Rosuvastatin (CK elevated); Pravastatin (rash); Atorvastatin (myalgias) Hx of pancreatitis Obesity  Rheumatoid arthritis  Normocytic anemia   Cardiac Studies & Procedures   CARDIAC CATHETERIZATION  CARDIAC CATHETERIZATION 04/26/2022  Narrative   Mid LM to Dist LM lesion is 90% stenosed.   Ost Cx to Prox Cx lesion is 75% stenosed.   1st Mrg lesion is 70% stenosed.   Prox LAD to Mid LAD lesion is 90% stenosed.   Dist LAD lesion is 100% stenosed.    Mid RCA lesion is 100% stenosed.   LV end diastolic pressure is normal.  LVEDP 15 mmHg.   There is no aortic valve stenosis.   Severe, near continuous belching during the case while lying flat.   Aortic saturation 88%, PA saturation 67%, PA pressure 25/11, mean PA pressure 18 mmHg, mean pulmonary capillary wedge pressure 17 mmHg, cardiac output 6.72 L/min, cardiac index 3.95.  Mean right atrial pressure 5 mmHg.   Essentially normal right heart pressures after recent diuresis due to elevated BNP.  Severe, three-vessel coronary artery disease.  Significant progression of her CAD compared to her 2009 catheterization.  I do not think she would be a surgical candidate due to overall frailty and age.  No good PCI options.  Results conveyed by phone to the daughter.  Of note, she was having significant reflux, belching during the procedure while lying flat.  She was treated with Zofran.  In talking to her daughter, this is her biggest complaint.  She cannot sleep at night.  She has to sit up to avoid these symptoms. Her next issue is when she tries to exert, she has chest discomfort.  This is more cardiac pain.  Will discuss with interventional colleagues but I do not see any targets for PCI given the diffuse nature of her severe CAD.  Would consider palliative care.  Will watch overnight in the hospital to help with medicine titration and possible palliative care consult.  Findings Coronary Findings Diagnostic  Dominance: Right  Left  Main Mid LM to Dist LM lesion is 90% stenosed. The lesion is calcified.  Left Anterior Descending There is moderate diffuse disease throughout the vessel. Collaterals Dist LAD filled by collaterals from Dist LAD.  Prox LAD to Mid LAD lesion is 90% stenosed. Dist LAD lesion is 100% stenosed.  Left Circumflex Ost Cx to Prox Cx lesion is 75% stenosed.  First Obtuse Marginal Branch 1st Mrg lesion is 70% stenosed.  Right Coronary Artery Mid RCA lesion is 100%  stenosed.  Acute Marginal Branch Collaterals Acute Mrg filled by collaterals from RV Branch.  Right Posterior Descending Artery Collaterals RPDA filled by collaterals from 2nd Sept.  Intervention  No interventions have been documented.   STRESS TESTS  MYOCARDIAL PERFUSION IMAGING 02/19/2015  Narrative  The left ventricular ejection fraction is mildly decreased (45-54%).  Nuclear stress EF: 50%.  There was no ST segment deviation noted during stress.  No T wave inversion was noted during stress.  Defect 1: There is a small defect of moderate severity.  There is a small size, moderate intensity reversible apical and apical inferior wall perfusion defect. This may represent ischemia (SDS 5). There is underlying inferior bowel attenuation and the calculated LVEF is 50% with inferolateral wall hypokinesis. This is an intermediate risk study. Clinical correlation is advised.   ECHOCARDIOGRAM  ECHOCARDIOGRAM COMPLETE 06/01/2022  Narrative ECHOCARDIOGRAM REPORT    Patient Name:   [name] [name] Date of Exam: 06/01/2022 Medical Rec #:  [id number]             Height:       60.0 in Accession #:    [id number]            Weight:       153.0 lb Date of Birth:  04/19/1936              BSA:          1.666 m Patient Age:    87 years              BP:           111/76 mmHg Patient Gender: F                     HR:           76 bpm. Exam Location:  Inpatient  Procedure: 2D Echo and Intracardiac Opacification Agent  Indications:    CHF  History:        Patient has prior history of Echocardiogram examinations, most recent 11/26/2021. CAD; Risk Factors:Hypertension and Dyslipidemia.  Sonographer:    Harvie Junior Referring Phys: [phone] [facility]   Sonographer Comments: Technically difficult study due to poor echo windows. Image acquisition challenging due to respiratory motion. IMPRESSIONS   1. Left ventricular ejection fraction, by estimation, is 25 to 30%. The left  ventricle has severely decreased function. There is global hypokinesis with akinesis of the inferior and all septal LV segments concerning for multivessel CAD. Left ventricular diastolic parameters are consistent with Grade II diastolic dysfunction (pseudonormalization). Elevated left atrial pressure. 2. Right ventricular systolic function is moderately reduced. The right ventricular size is not well visualized. There is mildly elevated pulmonary artery systolic pressure. 3. Left atrial size was moderately dilated. 4. The mitral valve is degenerative. There is tethering of the posterior mitral valve leaflet with resultant moderate mitral valve regurgitation. Moderate mitral annular calcification. 5. Moderate-to-severe low flow, low gradient aortic stenosis with mean graident 15mmHg, Vmax 90m/s, AVA 1.0,  DI 0.29. The aortic valve is calcified. There is severe calcifcation of the aortic valve. There is severe thickening of the aortic valve. Aortic valve regurgitation is not visualized. Moderate to severe aortic valve stenosis. 6. The inferior vena cava is dilated in size with <50% respiratory variability, suggesting right atrial pressure of 15 mmHg.  Comparison(s): Compared to prior TTE on 11/2021, the LVEF has dropped significantly from 65-70% to 25-30% with wall motion abnormalities as described above.  FINDINGS Left Ventricle: There is global hypokinesis with akinesis of the inferior and all septal LV segments concerning for multivessel CAD. Left ventricular ejection fraction, by estimation, is 25 to 30%. The left ventricle has severely decreased function. Definity contrast agent was given IV to delineate the left ventricular endocardial borders. The left ventricular internal cavity size was mildly dilated. There is no left ventricular hypertrophy. Left ventricular diastolic parameters are consistent with Grade II diastolic dysfunction (pseudonormalization). Elevated left atrial pressure.  Right  Ventricle: The right ventricular size is not well visualized. Right vetricular wall thickness was not well visualized. Right ventricular systolic function is moderately reduced. There is mildly elevated pulmonary artery systolic pressure. The tricuspid regurgitant velocity is 2.89 m/s, and with an assumed right atrial pressure of 3 mmHg, the estimated right ventricular systolic pressure is 36.4 mmHg.  Left Atrium: Left atrial size was moderately dilated.  Right Atrium: Right atrial size was normal in size.  Pericardium: There is no evidence of pericardial effusion.  Mitral Valve: The mitral valve is degenerative in appearance. There is moderate thickening of the mitral valve leaflet(s). There is mild calcification of the mitral valve leaflet(s). Moderate mitral annular calcification. There is tethering of the posterior mitral valve leaflet with resultant moderate mitral valve regurgitation. MV peak gradient, 6.2 mmHg. The mean mitral valve gradient is 2.0 mmHg.  Tricuspid Valve: The tricuspid valve is grossly normal. Tricuspid valve regurgitation is trivial.  Aortic Valve: Moderate-to-severe low flow, low gradient aortic stenosis with mean graident , Vmax 80m/s, AVA 1.0, DI 0.29. The aortic valve is calcified. There is severe calcifcation of the aortic valve. There is severe thickening of the aortic valve. Aortic valve regurgitation is not visualized. Moderate to severe aortic stenosis is present. Aortic valve mean gradient measures 9.0 mmHg. Aortic valve peak gradient measures 15.1 mmHg. Aortic valve area, by VTI measures 0.92 cm.  Pulmonic Valve: The pulmonic valve was not well visualized. Pulmonic valve regurgitation is trivial.  Aorta: The aortic root is normal in size and structure.  Venous: The inferior vena cava is dilated in size with less than 50% respiratory variability, suggesting right atrial pressure of 15 mmHg.  IAS/Shunts: The atrial septum is grossly normal.   LEFT  VENTRICLE PLAX 2D LVIDd:         4.70 cm      Diastology LVIDs:         4.10 cm      LV e' medial:    5.33 cm/s LV PW:         1.10 cm      LV E/e' medial:  22.1 LV IVS:        1.10 cm      LV e' lateral:   6.64 cm/s LVOT diam:     1.90 cm      LV E/e' lateral: 17.8 LV SV:         35 LV SV Index:   21 LVOT Area:     2.84 cm  LV Volumes (MOD) LV vol d,  MOD A2C: 173.0 ml LV vol d, MOD A4C: 127.0 ml LV vol s, MOD A2C: 131.0 ml LV vol s, MOD A4C: 89.6 ml LV SV MOD A2C:     42.0 ml LV SV MOD A4C:     127.0 ml LV SV MOD BP:      42.7 ml  RIGHT VENTRICLE RV Basal diam:  3.80 cm RV Mid diam:    3.40 cm RV S prime:     7.94 cm/s TAPSE (M-mode): 1.6 cm  LEFT ATRIUM             Index        RIGHT ATRIUM           Index LA diam:        3.30 cm 1.98 cm/m   RA Area:     12.80 cm LA Vol (A2C):   56.2 ml 33.74 ml/m  RA Volume:   29.30 ml  17.59 ml/m LA Vol (A4C):   70.9 ml 42.56 ml/m LA Biplane Vol: 66.2 ml 39.74 ml/m AORTIC VALVE                     PULMONIC VALVE AV Area (Vmax):    0.91 cm      PV Vmax:       1.28 m/s AV Area (Vmean):   0.91 cm      PV Peak grad:  6.6 mmHg AV Area (VTI):     0.92 cm AV Vmax:           194.00 cm/s AV Vmean:          137.500 cm/s AV VTI:            0.381 m AV Peak Grad:      15.1 mmHg AV Mean Grad:      9.0 mmHg LVOT Vmax:         62.10 cm/s LVOT Vmean:        43.900 cm/s LVOT VTI:          0.123 m LVOT/AV VTI ratio: 0.32  AORTA Ao Root diam: 3.10 cm Ao Asc diam:  2.90 cm  MITRAL VALVE                  TRICUSPID VALVE MV Area (PHT): 4.63 cm       TR Peak grad:   33.4 mmHg MV Area VTI:   1.11 cm       TR Vmax:        289.00 cm/s MV Peak grad:  6.2 mmHg MV Mean grad:  2.0 mmHg       SHUNTS MV Vmax:       1.25 m/s       Systemic VTI:  0.12 m MV Vmean:      60.4 cm/s      Systemic Diam: 1.90 cm MV Decel Time: 164 msec MR Peak grad:    73.4 mmHg MR Mean grad:    47.0 mmHg MR Vmax:         428.50 cm/s MR Vmean:        329.0 cm/s MR  PISA:         3.08 cm MR PISA Eff ROA: 20 mm MR PISA Radius:  0.70 cm MV E velocity: 118.00 cm/s MV A velocity: 82.50 cm/s MV E/A ratio:  1.43  Laurance Flatten MD Electronically signed by Laurance Flatten MD Signature Date/Time: 06/01/2022/5:19:45 PM    Final  History of Present Illness:   [name] [name] is a 87 y.o. female with the above problem list.  She was last seen 05/10/2022.  She had recently undergone cardiac catheterization which demonstrated severe progression of three-vessel CAD.  There were no targets for PCI and medical therapy was recommended.  She was seen by palliative care and opted to be DNR.      She was admitted 12/26-12/31 with a non-STEMI in the setting of acute HFrEF.  CT was negative for PE.  She did have effusions noted.  Echocardiogram demonstrated reduced LV function with EF 25-30 (previously normal).  Her aortic stenosis was also worse consistent with low-flow low gradient moderate to severe aortic stenosis.  Her troponin increased to 2696.  She was placed on Plavix (aspirin allergy), low-dose metoprolol succinate and low-dose telmisartan.  She was tolerating this well.  MRA, SGLT2 inhib can be considered in the future. Her creatinine did increase to 1.91.  Diuretics were held at 1 point due to this.  Medical management has been recommended previously.  She is not a candidate for TAVR.  BMET today***  EKG:  {Desc; done/not:10129}    Reviewed and updated this encounter:***     ROS  Labs/Other Test Reviewed:   Recent Labs: 04/20/2022: NT-Pro BNP 1,771 05/31/2022: ALT 14; B Natriuretic Peptide 4,012.1 06/04/2022: Hemoglobin 9.7; Platelets 162 06/05/2022: BUN 33; Creatinine, Ser 1.48; Magnesium 2.3; Potassium 4.5; Sodium 142  Recent Lipid Panel Recent Labs    11/26/21 0830  CHOL 146  TRIG 34  HDL 44  VLDL 7  LDLCALC 95    Risk Assessment/Calculations/Metrics:   {Does this patient have ATRIAL FIBRILLATION?[phone]      No BP recorded.  {Refresh Note OR Click here to enter BP  :1}***   Physical Exam:   VS:  There were no vitals taken for this visit.   Wt Readings from Last 3 Encounters:  06/02/22 154 lb 8.7 oz (70.1 kg)  05/10/22 160 lb 6.4 oz (72.8 kg)  04/28/22 159 lb 8 oz (72.3 kg)    Physical Exam ***     ASSESSMENT & PLAN:   No problem-specific Assessment & Plan notes found for this encounter.  {ReCoronary artery disease involving native coronary artery with angina pectoris (HCC) Recent cath with severe 3 v CAD with 90% dLM stenosis. There are no targets for revasc. Med Rx has been recommended. She has been seen by Palliative Care and is now DNR. She is doing better w/o recurrent chest pain. She does have jaw pain. She is fatigued and her HR is in the low 50s. She has a 1st degree AVB as well.  Reduce metoprolol tartrate to 12.5 mg twice daily Continue Plavix 75 mg once daily, Imdur 60 mg once daily If chest pain recurs on lower dose beta-blocker, will increase Imdur to 90 mg once daily  F/u in 3 mos   Anxiety state She notes fatigue since starting this. Decrease escitalopram to 5 mg once daily and f/u with prescribing MD.    Moderate aortic stenosis Mod AS on echocardiogram in June 2023. No significant aortic stenosis noted on recent cardiac catheterization.    Hyperlipidemia Intol of statins.    Chronic heart failure with preserved ejection fraction (HCC) Volume appears stable. Continue Lasix 40 mg once daily, K+ 20 mEq once daily. BMET today.     Hypertension The patient's blood pressure is controlled on her current regimen.  Continue current therapy.  xt:1}      {Are you  ordering a CV Procedure (e.g. stress test, cath, DCCV, TEE, etc)?   Press F2        :[id number]}  Dispo:  No follow-ups on file.  Medication Adjustments/Labs and Tests Ordered: Current medicines are reviewed at length with the patient today.  Concerns regarding medicines are outlined above.  Tests Ordered: No orders  of the defined types were placed in this encounter.  Medication Changes: No orders of the defined types were placed in this encounter.  Signed, Tereso Newcomer, PA-C  06/13/2022 12:45 PM    [facility] [address] [place], [place], [place]  [address] Phone: [phone]; Fax: [phone]

## 2022-06-16 ENCOUNTER — Ambulatory Visit: Payer: Self-pay

## 2022-06-16 NOTE — Patient Outreach (Signed)
  Care Coordination   Follow Up Visit Note   06/16/2022 Name: Rielly Brunn MRN: [id number] DOB: 12/16/1935  Noah Pelaez is a 87 y.o. year old female who sees Donnajean Lopes, MD for primary care. I  Spoke with daughter Becky Sax Shock DPR  What matters to the patients health and wellness today?  States pt died on Jul 09, 2022.   Expressed sympathy to family and instructed to contact RNCM if they have any further needs   Goals Addressed             This Visit's Progress    COMPLETED: Community resources post hospitalization       Care Coordination Interventions: Advised patient to pace activity and to progress activity slowly Provided education to patient re: reinforced to follow heart health low salt diet Reviewed scheduled/upcoming provider appointments including cardiology 06/13/22 Patient has [facility] contact number and know how to get in contact with them. Reinforced to call them as needed  Reviewed Importance of taking all medications as prescribed Discussed that Palliative care has been referred and to expect a call and given number of Palliative care to call if they do not hear from them.  Reviewed differences between Palliative care and Hospice Reinforced importance of daily weights reinforced worsening signs/symptoms and when to call the doctor Reviewed to try to add more foods with protein and nutritional supplements to help prevent further weight loss from muscle loss 06/16/22 case closed pt expired 07-09-22        SDOH assessments and interventions completed:  No     Care Coordination Interventions:  No, not indicated   Follow up plan: No further intervention required. Case closed pt deceased    Encounter Outcome:  Pt. Visit Completed

## 2022-07-07 DIAGNOSIS — 419620001 Death: Secondary | SNOMED CT | POA: Diagnosis not present

## 2022-07-07 DEATH — deceased

## 2022-08-10 ENCOUNTER — Ambulatory Visit: Payer: PPO | Admitting: Interventional Cardiology
